# Patient Record
Sex: Female | Born: 1940 | Race: White | Hispanic: No | Smoking: Former smoker
Health system: Southern US, Community
[De-identification: ages and names within clinical notes are randomized; demographics above are authoritative.]

## PROBLEM LIST (undated history)

## (undated) DIAGNOSIS — M549 Dorsalgia, unspecified: Secondary | ICD-10-CM

## (undated) DIAGNOSIS — I10 Essential (primary) hypertension: Secondary | ICD-10-CM

## (undated) DIAGNOSIS — S329XXA Fracture of unspecified parts of lumbosacral spine and pelvis, initial encounter for closed fracture: Secondary | ICD-10-CM

## (undated) DIAGNOSIS — K219 Gastro-esophageal reflux disease without esophagitis: Secondary | ICD-10-CM

## (undated) DIAGNOSIS — F17200 Nicotine dependence, unspecified, uncomplicated: Secondary | ICD-10-CM

## (undated) DIAGNOSIS — K449 Diaphragmatic hernia without obstruction or gangrene: Secondary | ICD-10-CM

## (undated) DIAGNOSIS — I509 Heart failure, unspecified: Secondary | ICD-10-CM

## (undated) DIAGNOSIS — E785 Hyperlipidemia, unspecified: Secondary | ICD-10-CM

## (undated) DIAGNOSIS — G8929 Other chronic pain: Secondary | ICD-10-CM

## (undated) HISTORY — DX: Gastro-esophageal reflux disease without esophagitis: K21.9

## (undated) HISTORY — DX: Diaphragmatic hernia without obstruction or gangrene: K44.9

## (undated) HISTORY — DX: Other chronic pain: G89.29

## (undated) HISTORY — DX: Dorsalgia, unspecified: M54.9

## (undated) HISTORY — PX: ABDOMINAL HYSTERECTOMY: SHX81

## (undated) HISTORY — DX: Essential (primary) hypertension: I10

## (undated) HISTORY — PX: HEMORROIDECTOMY: SUR656

## (undated) HISTORY — DX: Fracture of unspecified parts of lumbosacral spine and pelvis, initial encounter for closed fracture: S32.9XXA

## (undated) HISTORY — PX: TONSILLECTOMY: SUR1361

## (undated) HISTORY — PX: LUMBAR SPINE SURGERY: SHX701

## (undated) HISTORY — DX: Hyperlipidemia, unspecified: E78.5

## (undated) HISTORY — DX: Nicotine dependence, unspecified, uncomplicated: F17.200

## (undated) HISTORY — PX: APPENDECTOMY: SHX54

## (undated) HISTORY — PX: OOPHORECTOMY: SHX86

---

## 2001-03-14 HISTORY — PX: GASTRIC BYPASS: SHX52

## 2008-03-14 LAB — HM COLONOSCOPY: HM COLON: NORMAL

## 2012-12-12 DIAGNOSIS — S329XXA Fracture of unspecified parts of lumbosacral spine and pelvis, initial encounter for closed fracture: Secondary | ICD-10-CM

## 2012-12-12 HISTORY — DX: Fracture of unspecified parts of lumbosacral spine and pelvis, initial encounter for closed fracture: S32.9XXA

## 2013-01-28 ENCOUNTER — Encounter: Payer: Self-pay | Admitting: Physician Assistant

## 2013-01-28 ENCOUNTER — Ambulatory Visit (INDEPENDENT_AMBULATORY_CARE_PROVIDER_SITE_OTHER): Payer: Medicare Other | Admitting: Physician Assistant

## 2013-01-28 VITALS — BP 146/80 | HR 80 | Temp 98.3°F | Resp 18 | Ht 61.0 in | Wt 176.0 lb

## 2013-01-28 DIAGNOSIS — D649 Anemia, unspecified: Secondary | ICD-10-CM

## 2013-01-28 DIAGNOSIS — IMO0001 Reserved for inherently not codable concepts without codable children: Secondary | ICD-10-CM

## 2013-01-28 DIAGNOSIS — K449 Diaphragmatic hernia without obstruction or gangrene: Secondary | ICD-10-CM | POA: Insufficient documentation

## 2013-01-28 DIAGNOSIS — I10 Essential (primary) hypertension: Secondary | ICD-10-CM | POA: Insufficient documentation

## 2013-01-28 DIAGNOSIS — K219 Gastro-esophageal reflux disease without esophagitis: Secondary | ICD-10-CM | POA: Insufficient documentation

## 2013-01-28 DIAGNOSIS — M549 Dorsalgia, unspecified: Secondary | ICD-10-CM

## 2013-01-28 DIAGNOSIS — F411 Generalized anxiety disorder: Secondary | ICD-10-CM

## 2013-01-28 DIAGNOSIS — S329XXD Fracture of unspecified parts of lumbosacral spine and pelvis, subsequent encounter for fracture with routine healing: Secondary | ICD-10-CM

## 2013-01-28 DIAGNOSIS — F17200 Nicotine dependence, unspecified, uncomplicated: Secondary | ICD-10-CM

## 2013-01-28 DIAGNOSIS — G8929 Other chronic pain: Secondary | ICD-10-CM | POA: Insufficient documentation

## 2013-01-28 DIAGNOSIS — E785 Hyperlipidemia, unspecified: Secondary | ICD-10-CM | POA: Insufficient documentation

## 2013-01-28 LAB — CBC WITH DIFFERENTIAL/PLATELET
Basophils Relative: 0 % (ref 0–1)
Eosinophils Absolute: 0.1 10*3/uL (ref 0.0–0.7)
HCT: 34.3 % — ABNORMAL LOW (ref 36.0–46.0)
Hemoglobin: 10.8 g/dL — ABNORMAL LOW (ref 12.0–15.0)
Lymphocytes Relative: 30 % (ref 12–46)
MCH: 24.3 pg — ABNORMAL LOW (ref 26.0–34.0)
MCHC: 31.5 g/dL (ref 30.0–36.0)
Monocytes Absolute: 0.5 10*3/uL (ref 0.1–1.0)
Monocytes Relative: 7 % (ref 3–12)
Neutro Abs: 4.6 10*3/uL (ref 1.7–7.7)
Neutrophils Relative %: 62 % (ref 43–77)

## 2013-01-28 MED ORDER — PANTOPRAZOLE SODIUM 40 MG PO TBEC: 40.0000 mg | DELAYED_RELEASE_TABLET | Freq: Every day | ORAL | Status: DC

## 2013-01-28 MED ORDER — TRAZODONE HCL 50 MG PO TABS: 50.0000 mg | ORAL_TABLET | Freq: Every day | ORAL | Status: DC

## 2013-01-28 MED ORDER — OXYCODONE-ACETAMINOPHEN 10-325 MG PO TABS: 1.0000 | ORAL_TABLET | ORAL | Status: DC | PRN

## 2013-01-28 MED ORDER — TIZANIDINE HCL 4 MG PO TABS: 4.0000 mg | ORAL_TABLET | Freq: Every day | ORAL | Status: DC

## 2013-01-28 MED ORDER — CLONAZEPAM 1 MG PO TABS: 1.0000 mg | ORAL_TABLET | Freq: Every day | ORAL | Status: DC

## 2013-01-28 MED ORDER — ATORVASTATIN CALCIUM 20 MG PO TABS: 20.0000 mg | ORAL_TABLET | Freq: Every day | ORAL | Status: DC

## 2013-01-28 MED ORDER — GABAPENTIN 600 MG PO TABS: 600.0000 mg | ORAL_TABLET | Freq: Three times a day (TID) | ORAL | Status: DC

## 2013-01-29 LAB — COMPLETE METABOLIC PANEL WITH GFR
Albumin: 3.9 g/dL (ref 3.5–5.2)
Alkaline Phosphatase: 146 U/L — ABNORMAL HIGH (ref 39–117)
BUN: 9 mg/dL (ref 6–23)
CO2: 27 mEq/L (ref 19–32)
Calcium: 9.5 mg/dL (ref 8.4–10.5)
GFR, Est African American: 76 mL/min
GFR, Est Non African American: 66 mL/min
Glucose, Bld: 97 mg/dL (ref 70–99)
Potassium: 4.2 mEq/L (ref 3.5–5.3)
Total Bilirubin: 0.4 mg/dL (ref 0.3–1.2)

## 2013-01-29 NOTE — Progress Notes (Signed)
Patient ID: Brenda Hopkins MRN: [id number], DOB: 28-Jul-1940, 72 y.o. Date of Encounter: @DATE @  Chief Complaint:  Chief Complaint  Patient presents with  . NP - Establish  . Hospitalization Follow-up    In hospital in [place] - fractured pelvis    HPI: 72 y.o. year old white female  presents with her daughter for her OV today. She is being seen as a new pt to our office to establish care.  She was living in [place] but recently was hospitalized in [place] and since discharge from the hospital, she has come here to live with her daughter.  ALL RECORDS ARE PENDING. ALL HISTORY IS FROM PT AND DAUGHTER. They report that she fell and had pelvic fracture. During hopsitalizaton, developed anemia and was determined to have "internal bleeding." Also was treated for UTI during that hospitalization per pt/dtr.   They report that she was already on pain meds secondary to back pain prior to the hospitalization. Reports that she had two back surgeries in 2009 after a fall.  Says she was on Hydrocodone, then later Oxycodone. -for her back.  Also, was already on Trazadone, "to help her relax, to sleep."  Says that she was just started on potassium at the hospital. 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    Past Medical History  Diagnosis Date  . Hypertension   . Chronic back pain   . Hyperlipidemia   . GERD (gastroesophageal reflux disease)   . Hiatal hernia   . Pelvic fracture 12/12/2012  . Smoker      Home Meds: See attached medication section for current medication list. Any medications entered into computer today will not appear on this note's list. The medications listed below were entered prior to today. No current outpatient prescriptions on file prior to visit.   No current facility-administered medications on file prior to visit.    Allergies:  Allergies  Allergen Reactions  . Nicoderm [Nicotine]     Site rash    History   Social History  . Marital Status: Divorced   Spouse Name: N/A    Number of Children: N/A  . Years of Education: N/A   Occupational History  . Not on file.   Social History Main Topics  . Smoking status: Former Smoker    Types: Cigarettes  . Smokeless tobacco: Former Neurosurgeon    Quit date: 12/28/2012  . Alcohol Use: No  . Drug Use: No  . Sexual Activity: Not on file   Other Topics Concern  . Not on file   Social History Narrative  . No narrative on file    History reviewed. No pertinent family history.   Review of Systems:  See HPI for pertinent ROS. All other ROS negative.    Physical Exam: Blood pressure 146/80, pulse 80, temperature 98.3 F (36.8 C), temperature source Oral, resp. rate 18, height 5\' 1"  (1.549 m), weight 176 lb (79.833 kg)., Body mass index is 33.27 kg/(m^2). General: WNWD WF. Appears in no acute distress. Head: Normocephalic, atraumatic, eyes without discharge, sclera non-icteric, nares are without discharge. Bilateral auditory canals clear, TM's are without perforation, pearly grey and translucent with reflective cone of light bilaterally. Oral cavity moist, posterior pharynx without exudate, erythema, peritonsillar abscess, or post nasal drip.  Neck: Supple. No thyromegaly. No lymphadenopathy. Lungs: Clear bilaterally to auscultation without wheezes, rales, or rhonchi. Breathing is unlabored. Heart: RRR with S1 S2. No murmurs, rubs, or gallops. Abdomen: Soft, non-tender, non-distended with normoactive bowel sounds. No hepatomegaly. No  rebound/guarding. No obvious abdominal masses. Extremities/Skin: Warm and dry. No clubbing or cyanosis. No edema. Neuro: Alert and oriented X 3. Moves all extremities spontaneously. Gait is normal. CNII-XII grossly in tact. Psych:  Responds to questions appropriately with a normal affect.     ASSESSMENT AND PLAN:  72 y.o. year old female with  1. Pelvic fracture, with routine healing, subsequent encounter - oxyCODONE-acetaminophen (PERCOCET) 10-325 MG per tablet; Take  1 tablet by mouth every 4 (four) hours as needed for pain. One every 8 hours as needed for pain.  Dispense: 30 tablet; Refill: 0  2. Anemia - CBC with Differential  3. Chronic back pain - COMPLETE METABOLIC PANEL WITH GFR - traZODone (DESYREL) 50 MG tablet; Take 1 tablet (50 mg total) by mouth at bedtime.  Dispense: 30 tablet; Refill: 2 - tiZANidine (ZANAFLEX) 4 MG tablet; Take 1 tablet (4 mg total) by mouth at bedtime.  Dispense: 30 tablet; Refill: 0 - oxyCODONE-acetaminophen (PERCOCET) 10-325 MG per tablet; Take 1 tablet by mouth every 4 (four) hours as needed for pain. One every 8 hours as needed for pain.  Dispense: 30 tablet; Refill: 0 - gabapentin (NEURONTIN) 600 MG tablet; Take 1 tablet (600 mg total) by mouth 3 (three) times daily.  Dispense: 90 tablet; Refill: 0  4. Hypertension - COMPLETE METABOLIC PANEL WITH GFR  5. Hyperlipidemia - COMPLETE METABOLIC PANEL WITH GFR - atorvastatin (LIPITOR) 20 MG tablet; Take 1 tablet (20 mg total) by mouth daily.  Dispense: 30 tablet; Refill: 2  6. GERD (gastroesophageal reflux disease) - COMPLETE METABOLIC PANEL WITH GFR - pantoprazole (PROTONIX) 40 MG tablet; Take 1 tablet (40 mg total) by mouth daily.  Dispense: 30 tablet; Refill: 3  7. Hiatal hernia  8. Generalized anxiety disorder - clonazePAM (KLONOPIN) 1 MG tablet; Take 1 tablet (1 mg total) by mouth daily.  Dispense: 30 tablet; Refill: 0  9. Smoker She says she quit smoking at time of recent hopitalization. Since then, smoking e-cigarettes only.   Will check labs. Refilled meds to hold her over until we get records. Will have her schedule f/u OV 3 weeks so we can further f/u once we obtain records.    Murray Hodgkins [place], [place], [facility] 01/29/2013 9:42 AM

## 2013-01-31 ENCOUNTER — Telehealth: Payer: Self-pay | Admitting: Physician Assistant

## 2013-01-31 NOTE — Telephone Encounter (Signed)
PT daughter carmen is calling to follow up to see if we have received her mothers medical records from [place] and if we have is the dr going to up her pain medications  Call back number for daughter is her cell [phone] or home of [phone]

## 2013-02-01 ENCOUNTER — Telehealth: Payer: Self-pay | Admitting: Physician Assistant

## 2013-02-01 NOTE — Telephone Encounter (Addendum)
Patient needs all her medicines refilled. Please call her before 5:00 pm.

## 2013-02-04 ENCOUNTER — Telehealth: Payer: Self-pay | Admitting: Physician Assistant

## 2013-02-04 DIAGNOSIS — G8929 Other chronic pain: Secondary | ICD-10-CM

## 2013-02-04 DIAGNOSIS — S329XXD Fracture of unspecified parts of lumbosacral spine and pelvis, subsequent encounter for fracture with routine healing: Secondary | ICD-10-CM

## 2013-02-04 NOTE — Telephone Encounter (Signed)
Please call her regarding a medication refill

## 2013-02-04 NOTE — Telephone Encounter (Signed)
Have you seen the records for this pt yet?

## 2013-02-04 NOTE — Telephone Encounter (Signed)
Requesting refill of Percocet.  States takes every four hours and she is out.  Rx given on 01/28/13  #30.

## 2013-02-05 MED ORDER — OXYCODONE-ACETAMINOPHEN 10-325 MG PO TABS: 1.0000 | ORAL_TABLET | Freq: Three times a day (TID) | ORAL | Status: DC | PRN

## 2013-02-05 NOTE — Telephone Encounter (Signed)
Spoke to provider [facility].  Discussed records received and medications being requested by patient.  Told to refill Percocet 10/325 one tablet every eight hours # 90 No refills.  Tell patient this must last her one month.  No refills prior to that.  Pain management referral will be initiated for her. Rx printed for signature

## 2013-02-05 NOTE — Telephone Encounter (Addendum)
Trying to call patient. (Pt called me back)  Question as to on Oxycodone 20 mg Q12 hrs??.  Need for Percocet Q4hrs.  States doesn't have any OxyContin and does not want to take that.  Did not seem to help.  Wants the Percocet that she can take every fours hours.  I told her we felt that was in excess and that we may need to sent to pain management as our providers do not do long term pain meds.  She states she does not want to go to Pain mgmt.  Was sent there in [place] and "They were not nice to her"  She says she is in terrible pain and she will take whatever we can give her.

## 2013-02-05 NOTE — Telephone Encounter (Signed)
Defer to MBD

## 2013-02-18 ENCOUNTER — Encounter: Payer: Self-pay | Admitting: Physician Assistant

## 2013-02-18 ENCOUNTER — Ambulatory Visit (INDEPENDENT_AMBULATORY_CARE_PROVIDER_SITE_OTHER): Payer: Medicare Other | Admitting: Physician Assistant

## 2013-02-18 VITALS — BP 154/100 | HR 68 | Temp 98.9°F | Resp 18 | Wt 176.0 lb

## 2013-02-18 DIAGNOSIS — S329XXS Fracture of unspecified parts of lumbosacral spine and pelvis, sequela: Secondary | ICD-10-CM

## 2013-02-18 DIAGNOSIS — I10 Essential (primary) hypertension: Secondary | ICD-10-CM

## 2013-02-18 DIAGNOSIS — IMO0002 Reserved for concepts with insufficient information to code with codable children: Secondary | ICD-10-CM

## 2013-02-18 DIAGNOSIS — G8929 Other chronic pain: Secondary | ICD-10-CM

## 2013-02-18 DIAGNOSIS — M549 Dorsalgia, unspecified: Secondary | ICD-10-CM

## 2013-02-18 MED ORDER — OXYCODONE HCL 10 MG PO TABS: 10.0000 mg | ORAL_TABLET | ORAL | Status: DC | PRN

## 2013-02-18 MED ORDER — AMLODIPINE BESYLATE 5 MG PO TABS: 5.0000 mg | ORAL_TABLET | Freq: Every day | ORAL | Status: DC

## 2013-02-18 NOTE — Progress Notes (Signed)
Patient ID: Brenda Hopkins MRN: [id number], DOB: 08-21-1940, 72 y.o. Date of Encounter: @DATE @  Chief Complaint:  Chief Complaint  Patient presents with  . follow up    lab results from last visit, needs more pain pills    HPI: 72 y.o. year old white female  presents for f/u OV.  She is only had one office visit here prior to today. That was with me on 01/28/13.  At that visit she was here with her daughter. She had been living in [place] until she was recently hospitalized in [place] secondary to a pelvic fracture. At the time of her initial visit with me we do not have any records and all information was according to the patient and her daughter. Since that fall and pelvic fracture she has moved here to where she can live with her daughter.  They reported that she was already on pain medications secondary to back pain prior to the most recent hospitalization. She reported that she had had 2 back surgeries in 2009 after a fall. She reported that she had been on hydrocodone and then later on oxycodone for her back. Also was already on trazodone" to help her relax and sleep".  At her visit with me on 01/28/13 we discussed our clinic policy regarding chronic pain medications. I told her that we typically do not prescribe any pain medications until we have records to review. She reported she absolutely had to have some pain medications. Gave her #30 of Percocet to hold her over until she can have followup once we obtained records.  Did obtain records which did indicate that she had been on chronic pain medications even prior to the recent pelvic fracture.  She was then informed to keep her followup office visit for today but that she would also have to follow up with the pain clinic as we would not prescribe long-term pain meds.  At her last visit here, I gave prescription with note for her to only use the Percocet one every 8 hours. SHe says that she absolutely cannot go 8 hours  between doses. Says she has to take it every 4 hours. States that after 4 hours the medicine  wears off and she has severe pain. Also she is requesting to have plain oxycodone without Tylenol mixed in. States taht " Tylenol does nothing for her back pain."   Past Medical History  Diagnosis Date  . Hypertension   . Chronic back pain   . Hyperlipidemia   . GERD (gastroesophageal reflux disease)   . Hiatal hernia   . Pelvic fracture 12/12/2012  . Smoker      Home Meds: See attached medication section for current medication list. Any medications entered into computer today will not appear on this note's list. The medications listed below were entered prior to today. Current Outpatient Prescriptions on File Prior to Visit  Medication Sig Dispense Refill  . atorvastatin (LIPITOR) 20 MG tablet Take 1 tablet (20 mg total) by mouth daily.  30 tablet  2  . clonazePAM (KLONOPIN) 1 MG tablet Take 1 tablet (1 mg total) by mouth daily.  30 tablet  0  . gabapentin (NEURONTIN) 600 MG tablet Take 1 tablet (600 mg total) by mouth 3 (three) times daily.  90 tablet  0  . oxyCODONE-acetaminophen (PERCOCET) 10-325 MG per tablet Take 1 tablet by mouth every 8 (eight) hours as needed for pain. One every 8 hours as needed for pain.  90 tablet  0  .  pantoprazole (PROTONIX) 40 MG tablet Take 1 tablet (40 mg total) by mouth daily.  30 tablet  3  . potassium chloride (K-DUR) 10 MEQ tablet Take 10 mEq by mouth daily.      Marland Kitchen tiZANidine (ZANAFLEX) 4 MG tablet Take 1 tablet (4 mg total) by mouth at bedtime.  30 tablet  0  . traZODone (DESYREL) 50 MG tablet Take 1 tablet (50 mg total) by mouth at bedtime.  30 tablet  2   No current facility-administered medications on file prior to visit.    Allergies:  Allergies  Allergen Reactions  . Nicoderm [Nicotine]     Site rash    History   Social History  . Marital Status: Divorced    Spouse Name: N/A    Number of Children: N/A  . Years of Education: N/A    Occupational History  . Not on file.   Social History Main Topics  . Smoking status: Former Smoker    Types: Cigarettes  . Smokeless tobacco: Former Neurosurgeon    Quit date: 12/28/2012  . Alcohol Use: No  . Drug Use: No  . Sexual Activity: Not on file   Other Topics Concern  . Not on file   Social History Narrative  . No narrative on file    No family history on file.   Review of Systems:  See HPI for pertinent ROS. All other ROS negative.    Physical Exam: Blood pressure 154/100, pulse 68, temperature 98.9 F (37.2 C), temperature source Oral, resp. rate 18, weight 176 lb (79.833 kg)., Body mass index is 33.27 kg/(m^2). General: Appears in no acute distress. Head: Normocephalic, atraumatic, eyes without discharge, sclera non-icteric, nares are without discharge. Bilateral auditory canals clear, TM's are without perforation, pearly grey and translucent with reflective cone of light bilaterally. Oral cavity moist, posterior pharynx without exudate, erythema, peritonsillar abscess, or post nasal drip.  Neck: Supple. No thyromegaly. No lymphadenopathy. Lungs: Clear bilaterally to auscultation without wheezes, rales, or rhonchi. Breathing is unlabored. Heart: RRR with S1 S2. No murmurs, rubs, or gallops. Abdomen: Soft, non-tender, non-distended with normoactive bowel sounds. No hepatomegaly. No rebound/guarding. No obvious abdominal masses. Musculoskeletal:  Strength and tone normal for age. Extremities/Skin: Warm and dry. No clubbing or cyanosis. No edema. No rashes or suspicious lesions. Neuro: Alert and oriented X 3. Moves all extremities spontaneously. Gait is normal. CNII-XII grossly in tact. Psych:  Responds to questions appropriately with a normal affect.     ASSESSMENT AND PLAN:  72 y.o. year old female with  1. Hypertension She says that she was on Amlodipine 5 mg in the past but somehow this has gotten left off of her current medications. Her blood pressure is elevated.  We'll add back Norvasc 5 mg. - amLODipine (NORVASC) 5 MG tablet; Take 1 tablet (5 mg total) by mouth daily.  Dispense: 90 tablet; Refill: 3  2. Chronic back pain - Oxycodone HCl 10 MG TABS; Take 1 tablet (10 mg total) by mouth every 4 (four) hours as needed.  Dispense: 90 tablet; Refill: 0  3. Pelvic fracture, sequela - Oxycodone HCl 10 MG TABS; Take 1 tablet (10 mg total) by mouth every 4 (four) hours as needed.  Dispense: 90 tablet; Refill: 0  I again told her that I will not prescribe chronic long-term pain medications. Today Kim checked and did see that there is a referral to the pain clinic in the computer system but just has not yet been scheduled. We'll followup on  making sure that this referral does occur. I told the patient that I would go ahead and put the oxycodone as to take every 4 hours but I cannot give her such a large quantity. We just give her #90 pills right now and will followup with the remainder of the prescription when it is due. As well at that time we can check the status of a referral to the pain clinic. The rest of her medical problems/ diagnosis list are outlined in my  office visit note dated 01/28/13. As well her records from [place] have been reviewed by me and are in the [place] [facility] for Selena Batten to abstract.  [address] [place], [place], [facility] 02/18/2013 5:02 PM

## 2013-02-26 ENCOUNTER — Telehealth: Payer: Self-pay | Admitting: Family Medicine

## 2013-02-26 DIAGNOSIS — G8929 Other chronic pain: Secondary | ICD-10-CM

## 2013-02-26 NOTE — Telephone Encounter (Signed)
Req Rf Tizanidine and Clonazepam.  Tizanidine LRF 11/17 #30  Clonazepam LRF 11/17 #30  LOV 02/18/13  OK refill?

## 2013-02-27 MED ORDER — TRAZODONE HCL 50 MG PO TABS: 50.0000 mg | ORAL_TABLET | Freq: Every day | ORAL | Status: DC

## 2013-02-27 NOTE — Telephone Encounter (Signed)
Spoke to patient.  Told provider concern about her taking the Zanaflex and Trazodone both at bedtime.  Told provider will only refill one of them.  Pt chose the Trazodone refill. Refill was sent and Zanaflex removed form med list.

## 2013-02-27 NOTE — Telephone Encounter (Signed)
Pt recently moved here and transferred her care here.  She was already on these meds prior to coming here.  I do not agree with all the meds she is taking and am in process of getting her into a pain clinic. Tell Patient that I was just reviewing her medications. Realized that she had been taking both Zanaflex and trazodone at night. I do not think it is safe to take both of these together. Tell her we need to stop one or the other. Ask her which one she feels she needs the most so we can decide which one to continue and which one to stop. I will not continue to prescribe both of these.

## 2013-02-28 ENCOUNTER — Other Ambulatory Visit: Payer: Self-pay | Admitting: Physician Assistant

## 2013-02-28 ENCOUNTER — Telehealth: Payer: Self-pay | Admitting: Family Medicine

## 2013-02-28 DIAGNOSIS — G8929 Other chronic pain: Secondary | ICD-10-CM

## 2013-02-28 DIAGNOSIS — S329XXS Fracture of unspecified parts of lumbosacral spine and pelvis, sequela: Secondary | ICD-10-CM

## 2013-02-28 NOTE — Telephone Encounter (Signed)
No more Tizanadine. Approved for refill on the clonazepam. #30+0 additional refill.

## 2013-02-28 NOTE — Telephone Encounter (Signed)
Request refill Tizanidine (this was d/c'd)  Also asking for Clonazepam  Last RF 11/17 #30  Pt called about her Oxycodone.  Requesting refill.

## 2013-03-01 NOTE — Telephone Encounter (Signed)
Clonazepam was called into pharmacy.  Pharmacy informed that Tizanidine has been discontinued.  I discussed her pain medication.  She states you gave her a 1/2 month supply and will she be able to get the other half when due next week??

## 2013-03-04 ENCOUNTER — Telehealth: Payer: Self-pay | Admitting: Physician Assistant

## 2013-03-04 MED ORDER — OXYCODONE HCL 10 MG PO TABS: 10.0000 mg | ORAL_TABLET | ORAL | Status: DC | PRN

## 2013-03-04 NOTE — Telephone Encounter (Signed)
[phone] or [phone] Pt is calling because she is needing a refill on her oxycodone it will run out on the 12/23

## 2013-03-04 NOTE — Telephone Encounter (Signed)
She insisted she had to take pain med Q 4 hours. I said "Fine-until f/u with pain clinic"- but that I was only giving 90 at a time.

## 2013-03-04 NOTE — Telephone Encounter (Signed)
RX printed and pt aware. 

## 2013-03-04 NOTE — Telephone Encounter (Signed)
PT keeps calling back and would like a call back

## 2013-03-04 NOTE — Telephone Encounter (Signed)
Pain referral still pending.  #90 (15 day supply) up tomorrow.  Refill or not??

## 2013-03-04 NOTE — Telephone Encounter (Signed)
Approved. May print another prescription for another  #90.

## 2013-03-04 NOTE — Telephone Encounter (Signed)
Pt called and left a voicemail asking for you to call her back regarding a prescription Call back number is [phone]

## 2013-03-18 ENCOUNTER — Telehealth: Payer: Self-pay | Admitting: Physician Assistant

## 2013-03-18 DIAGNOSIS — M549 Dorsalgia, unspecified: Principal | ICD-10-CM

## 2013-03-18 DIAGNOSIS — G8929 Other chronic pain: Secondary | ICD-10-CM

## 2013-03-18 DIAGNOSIS — S329XXD Fracture of unspecified parts of lumbosacral spine and pelvis, subsequent encounter for fracture with routine healing: Secondary | ICD-10-CM

## 2013-03-18 MED ORDER — OXYCODONE-ACETAMINOPHEN 10-325 MG PO TABS: 1.0000 | ORAL_TABLET | Freq: Three times a day (TID) | ORAL | Status: DC | PRN

## 2013-03-18 MED ORDER — CLONAZEPAM 1 MG PO TABS
1.0000 mg | ORAL_TABLET | Freq: Every day | ORAL | Status: DC
Start: 2013-03-18 — End: 2013-05-02

## 2013-03-18 MED ORDER — GABAPENTIN 600 MG PO TABS: 600.0000 mg | ORAL_TABLET | Freq: Three times a day (TID) | ORAL | Status: DC

## 2013-03-18 NOTE — Telephone Encounter (Signed)
Rx printed for provider signature 

## 2013-03-18 NOTE — Telephone Encounter (Signed)
Pt aware Rx ready 

## 2013-03-18 NOTE — Telephone Encounter (Signed)
Can go ahead and print prescription for the oxycodone and we will write "do not fill until..." the date due  To be filled.  As well, can go ahead and print prescription for Klonopin and also will write " do not fill until ..." the date that it is due.  FIND OUT STATUS OF REFERRAL TO PAIN CLINIC

## 2013-03-18 NOTE — Telephone Encounter (Signed)
Last RF OXY 12/22 #90,   Last RF Klonopin 12/18 #30,  Last RF Gabapentin 11/17 #90.

## 2013-03-18 NOTE — Telephone Encounter (Signed)
Pt is needing gabapentin, oxycodone, and klonopin  Call back number is [phone]

## 2013-03-28 ENCOUNTER — Encounter: Payer: Self-pay | Admitting: *Deleted

## 2013-04-04 ENCOUNTER — Telehealth: Payer: Self-pay | Admitting: Physician Assistant

## 2013-04-04 NOTE — Telephone Encounter (Signed)
No.  Denied.

## 2013-04-04 NOTE — Telephone Encounter (Signed)
Pt daughter called and the pt was taken off all her pain medications by her pain doctor, she was taken off them cold [place]. The daughter states that the mother is deff needing some xanax or something for her anxiety she has barley slept 2 hours within a 24 hour period Call back number is [phone] Pharmacy is [facility]

## 2013-04-04 NOTE — Telephone Encounter (Signed)
Pt called.  She states she took all her pain medicine to quickly and now she is out.  Pain clinic will not refill until due which is 04/18/13.  Wants to know if you will increase and refill her clonazepam because now she is having withdrawal??  Or prescribe something else?

## 2013-04-04 NOTE — Telephone Encounter (Signed)
Daughter says pain doctor ordered her pain med's for TID and she continued to take Q4hrs (thus she ran out)  Pain provider refuses to renew before 2/5 (that RX to be 5/day) Refused to order her anything else. Spoke to them about withdrawal symptoms. Told daughter request for more meds denied.  Asked to talk to provider, provider refused.  Answer to more meds NO.  If patient has problem with withdrawal should take to ED.

## 2013-05-01 ENCOUNTER — Telehealth: Payer: Self-pay | Admitting: *Deleted

## 2013-05-02 ENCOUNTER — Other Ambulatory Visit: Payer: Self-pay | Admitting: Physician Assistant

## 2013-05-02 NOTE — Telephone Encounter (Signed)
Approved for #30  +  0 additional refills

## 2013-05-02 NOTE — Telephone Encounter (Signed)
RX's called in  

## 2013-05-02 NOTE — Telephone Encounter (Signed)
Last RF clonazepam 03/30/13 #30.  Last OV 02/18/13.  OK refill?

## 2013-06-01 ENCOUNTER — Other Ambulatory Visit: Payer: Self-pay | Admitting: Physician Assistant

## 2013-06-03 NOTE — Telephone Encounter (Signed)
Medication refilled per protocol. 

## 2013-07-01 ENCOUNTER — Telehealth: Payer: Self-pay | Admitting: Physician Assistant

## 2013-07-01 NOTE — Telephone Encounter (Signed)
See phone note from 02/26/13. At that time realized patient was taking the Zanaflex and trazodone at night. Told her she had to stop one of these and could only continue one of these. At that time she chose to continue trazodone and to stop the Zanaflex. Tell her I am NOT prescribing   any type of muscle relaxer.

## 2013-07-01 NOTE — Telephone Encounter (Signed)
Call back number is [phone] Pt is wanting to know if Shon HaleMary Beth can switch her muscle relaxer to soma?

## 2013-07-03 ENCOUNTER — Other Ambulatory Visit: Payer: Self-pay | Admitting: Physician Assistant

## 2013-07-03 NOTE — Telephone Encounter (Signed)
Last RF clonazepam 06/03/13  #30  Last OV 02/18/13  OK refill?

## 2013-07-04 NOTE — Telephone Encounter (Signed)
Medication refilled per protocol. 

## 2013-07-04 NOTE — Telephone Encounter (Signed)
ApProved for #30 + 0

## 2013-07-11 ENCOUNTER — Ambulatory Visit: Payer: Medicare Other | Admitting: Physician Assistant

## 2013-07-15 ENCOUNTER — Encounter: Payer: Self-pay | Admitting: Physician Assistant

## 2013-07-15 ENCOUNTER — Ambulatory Visit (INDEPENDENT_AMBULATORY_CARE_PROVIDER_SITE_OTHER): Payer: Medicare Other | Admitting: Physician Assistant

## 2013-07-15 VITALS — BP 130/86 | HR 98 | Temp 98.3°F | Resp 18 | Ht <= 58 in | Wt 173.0 lb

## 2013-07-15 DIAGNOSIS — F411 Generalized anxiety disorder: Secondary | ICD-10-CM

## 2013-07-15 DIAGNOSIS — K219 Gastro-esophageal reflux disease without esophagitis: Secondary | ICD-10-CM

## 2013-07-15 DIAGNOSIS — I10 Essential (primary) hypertension: Secondary | ICD-10-CM

## 2013-07-15 DIAGNOSIS — E785 Hyperlipidemia, unspecified: Secondary | ICD-10-CM

## 2013-07-15 DIAGNOSIS — S329XXA Fracture of unspecified parts of lumbosacral spine and pelvis, initial encounter for closed fracture: Secondary | ICD-10-CM

## 2013-07-15 DIAGNOSIS — F17200 Nicotine dependence, unspecified, uncomplicated: Secondary | ICD-10-CM

## 2013-07-15 DIAGNOSIS — G8929 Other chronic pain: Secondary | ICD-10-CM

## 2013-07-15 DIAGNOSIS — M549 Dorsalgia, unspecified: Secondary | ICD-10-CM

## 2013-07-15 DIAGNOSIS — K449 Diaphragmatic hernia without obstruction or gangrene: Secondary | ICD-10-CM

## 2013-07-15 MED ORDER — SERTRALINE HCL 50 MG PO TABS: 50.0000 mg | ORAL_TABLET | Freq: Every day | ORAL | Status: DC

## 2013-07-15 MED ORDER — ALPRAZOLAM 0.5 MG PO TABS: 0.5000 mg | ORAL_TABLET | Freq: Every day | ORAL | Status: DC | PRN

## 2013-07-15 MED ORDER — CARISOPRODOL 350 MG PO TABS: 350.0000 mg | ORAL_TABLET | Freq: Three times a day (TID) | ORAL | Status: DC

## 2013-07-15 NOTE — Progress Notes (Signed)
Patient ID: Brenda DrapeBrenda Gail Varas MRN: [id number], DOB: 05/13/1940, 73 y.o. Date of Encounter: @DATE @  Chief Complaint:  Chief Complaint  Patient presents with  . wants something for anxiety and take off clonazepam i    HPI: 73 y.o. year old white female  presents for f/u OV.  She is only had 2 office visits here prior to today. The first OV was with me on 01/28/13.  At that visit she was here with her daughter. She had been living in [place] until she was recently hospitalized in [place] secondary to a pelvic fracture. At the time of her initial visit with me we do not have any records and all information was according to the patient and her daughter. Since that fall and pelvic fracture she has moved here to where she can live with her daughter.  They reported that she was already on pain medications secondary to back pain prior to the most recent hospitalization. She reported that she had had 2 back surgeries in 2009 after a fall. She reported that she had been on hydrocodone and then later on oxycodone for her back. Also was already on trazodone" to help her relax and sleep".  At her visit with me on 01/28/13 we discussed our clinic policy regarding chronic pain medications. I told her that we typically do not prescribe any pain medications until we have records to review. She reported she absolutely had to have some pain medications. Gave her #30 of Percocet to hold her over until she can have followup once we obtained records.  Did obtain records which did indicate that she had been on chronic pain medications even prior to the recent pelvic fracture.  She was then informed to keep her followup office visit for today but that she would also have to follow up with the pain clinic as we would not prescribe long-term pain meds.  She has had a multitude of phone calls with our office regarding changing medications over these months.   Today her daughter is with her again for her  visit. Daughter  does most of the talking. Both the daughter and the patient say that the patient is anxious all the time and worries about everything. Also she needs  medicine  for anxiety to help during the daytime too-not just night tome dose o fklonopin.   They both report that the patient is not sleeping well. Says that trazodone this isn't working very well--not sleeping well even with this med. . Patient says that she thinks the main reason she can't sleep is because of muscle spasms. She thinks if she had some medicine for muscle spasms to use for this during the day as well as at night that this would help with the spasms as well as help her sleep. Daughter states that soma works really well for her and both pt and daughter are requesting soma.  As the daughter says that she  uses Xanax herself that this works much better and thinks that we should use this instead of Klonopin.  The patient shows me a note that she received from her insurance regarding scheduling a complete physical exam. They will  give her $15 gift card for doing the physical.  Past Medical History  Diagnosis Date  . Hypertension   . Chronic back pain   . Hyperlipidemia   . GERD (gastroesophageal reflux disease)   . Hiatal hernia   . Pelvic fracture 12/12/2012  . Smoker  Home Meds: See attached medication section for current medication list. Any medications entered into computer today will not appear on this note's list. The medications listed below were entered prior to today. Current Outpatient Prescriptions on File Prior to Visit  Medication Sig Dispense Refill  . amLODipine (NORVASC) 5 MG tablet Take 1 tablet (5 mg total) by mouth daily.  90 tablet  3  . atorvastatin (LIPITOR) 20 MG tablet TAKE ONE TABLET AT BEDTIME  30 tablet  3  . gabapentin (NEURONTIN) 600 MG tablet TAKE ONE (1) TABLET THREE (3) TIMES EACH DAY  90 tablet  3  . Multiple Vitamin (MULTIVITAMIN) tablet Take 1 tablet by mouth daily.      .  Oxycodone HCl 10 MG TABS Take 1 tablet (10 mg total) by mouth every 4 (four) hours as needed.  90 tablet  0  . oxyCODONE-acetaminophen (PERCOCET) 10-325 MG per tablet Take 1 tablet by mouth every 8 (eight) hours as needed for pain. One every 8 hours as needed for pain.  90 tablet  0  . pantoprazole (PROTONIX) 40 MG tablet TAKE ONE TABLET ONCE DAILY  30 tablet  4  . potassium chloride (K-DUR) 10 MEQ tablet Take 10 mEq by mouth daily.       No current facility-administered medications on file prior to visit.    Allergies:  Allergies  Allergen Reactions  . Nicoderm [Nicotine]     Site rash    History   Social History  . Marital Status: Divorced    Spouse Name: N/A    Number of Children: N/A  . Years of Education: N/A   Occupational History  . Not on file.   Social History Main Topics  . Smoking status: Former Smoker    Types: Cigarettes  . Smokeless tobacco: Former Neurosurgeon    Quit date: 12/28/2012  . Alcohol Use: No  . Drug Use: No  . Sexual Activity: Not on file   Other Topics Concern  . Not on file   Social History Narrative  . No narrative on file    No family history on file.   Review of Systems:  See HPI for pertinent ROS. All other ROS negative.    Physical Exam: Blood pressure 130/86, pulse 98, temperature 98.3 F (36.8 C), temperature source Oral, resp. rate 18, height 4\' 10"  (1.473 m), weight 173 lb (78.472 kg)., Body mass index is 36.17 kg/(m^2). General: Appears in no acute distress. Head: Normocephalic, atraumatic, eyes without discharge, sclera non-icteric, nares are without discharge. Bilateral auditory canals clear, TM's are without perforation, pearly grey and translucent with reflective cone of light bilaterally. Oral cavity moist, posterior pharynx without exudate, erythema, peritonsillar abscess, or post nasal drip.  Neck: Supple. No thyromegaly. No lymphadenopathy. Lungs: Clear bilaterally to auscultation without wheezes, rales, or rhonchi. Breathing  is unlabored. Heart: RRR with S1 S2. No murmurs, rubs, or gallops. Abdomen: Soft, non-tender, non-distended with normoactive bowel sounds. No hepatomegaly. No rebound/guarding. No obvious abdominal masses. Musculoskeletal:  Strength and tone normal for age. Extremities/Skin: Warm and dry. No clubbing or cyanosis. No edema. No rashes or suspicious lesions. Neuro: Alert and oriented X 3. Moves all extremities spontaneously. Gait is normal. CNII-XII grossly in tact. Psych:  Responds to questions appropriately with a normal affect.     ASSESSMENT AND PLAN:  73 y.o. year old female with   1. Generalized anxiety disorder - sertraline (ZOLOFT) 50 MG tablet; Take 1 tablet (50 mg total) by mouth daily.  Dispense: 30 tablet;  Refill: 3 - ALPRAZolam (XANAX) 0.5 MG tablet; Take 1 tablet (0.5 mg total) by mouth daily as needed for anxiety.  Dispense: 30 tablet; Refill: 0  2. Hyperlipidemia She is on statin. Had CMET 01/28/13. Never had lipid panel sent the patient here and she is never fasting. Discussed that  we need to get fasting labs. She is going to schedule a complete physical exam one morning in next 1-2 weeks-- and come fasting to that appointment.  3. Hypertension Blood Pressure is at goal. Continue current medications.  4. Smoker  5. Hiatal hernia  6. GERD (gastroesophageal reflux disease)  7. H/O Pelvic fracture--this occurred just prior to her moving here from [place] and establishing care here in November 2014.  8. Chronic back pain She does see the pain clinic and get other pain medications from them. She was instructed at her initial visit with me that we would not prescribe long-term pain medications. - carisoprodol (SOMA) 350 MG tablet; Take 1 tablet (350 mg total) by mouth 3 (three) times daily.  Dispense: 90 tablet; Refill: 2  Today I have removed the following medications from her medicine list: Clonazepam/Klonopin. Trazodone.  New Medications added  today are Xanax  0.5 mg 1 daily when necessary #30+0 Soma 350 mg 1 by mouth 3 times a day when necessary #90+2 refill Zoloft 50 mg 1 by mouth daily #30 with refills  She is to schedule a complete physical exam and come fasting to that appointment.  Signed, [address]Jedi Catalfamo Beth PowersDixon, [place], [facility] 07/15/2013 5:20 PM

## 2013-08-07 ENCOUNTER — Encounter: Payer: Medicare Other | Admitting: Physician Assistant

## 2013-08-15 ENCOUNTER — Other Ambulatory Visit: Payer: Self-pay | Admitting: Physician Assistant

## 2013-08-15 NOTE — Telephone Encounter (Signed)
ok 

## 2013-08-15 NOTE — Telephone Encounter (Signed)
Ok to refill??  Last office visit/ refill 07/15/2013 with PA.

## 2013-08-15 NOTE — Telephone Encounter (Signed)
Medication called to pharmacy. 

## 2013-09-06 ENCOUNTER — Other Ambulatory Visit: Payer: Self-pay | Admitting: Family Medicine

## 2013-09-06 ENCOUNTER — Other Ambulatory Visit: Payer: Self-pay | Admitting: Physician Assistant

## 2013-09-06 NOTE — Telephone Encounter (Signed)
Approved.  

## 2013-09-06 NOTE — Telephone Encounter (Signed)
?   OK to Refill - not due til 09/13/13 - per pharm note will put on hold until them

## 2013-09-06 NOTE — Telephone Encounter (Signed)
Medication refilled per protocol. 

## 2013-09-06 NOTE — Telephone Encounter (Signed)
One month calledto pharmacy but DO NOT FILL until 09/13/13.

## 2013-09-11 NOTE — Telephone Encounter (Signed)
Approved. # 30 + 0. 

## 2013-10-14 ENCOUNTER — Other Ambulatory Visit: Payer: Self-pay | Admitting: Physician Assistant

## 2013-10-14 ENCOUNTER — Other Ambulatory Visit: Payer: Self-pay | Admitting: Family Medicine

## 2013-10-14 NOTE — Telephone Encounter (Signed)
Ok to refill??  Last office visit 07/15/2013.  Last refill 09/13/2013.

## 2013-10-14 NOTE — Telephone Encounter (Signed)
May  refill for #30+ 0.

## 2013-10-14 NOTE — Telephone Encounter (Signed)
Ok to refill??  Last office visit/ refill 07/15/2013, #2 refills.

## 2013-10-15 NOTE — Telephone Encounter (Signed)
Medication called to pharmacy. 

## 2013-10-16 NOTE — Telephone Encounter (Signed)
Approved. #90+2 additional refills

## 2013-10-17 NOTE — Telephone Encounter (Signed)
Medication refilled per protocol and pt was on phone and aware.

## 2013-10-21 ENCOUNTER — Encounter: Payer: Medicare Other | Admitting: Physician Assistant

## 2013-10-24 ENCOUNTER — Encounter: Payer: Self-pay | Admitting: Physician Assistant

## 2013-10-24 ENCOUNTER — Ambulatory Visit (INDEPENDENT_AMBULATORY_CARE_PROVIDER_SITE_OTHER): Payer: Medicare Other | Admitting: Physician Assistant

## 2013-10-24 VITALS — BP 140/86 | HR 84 | Temp 98.6°F | Resp 20 | Ht 61.0 in | Wt 169.0 lb

## 2013-10-24 DIAGNOSIS — Z23 Encounter for immunization: Secondary | ICD-10-CM

## 2013-10-24 DIAGNOSIS — Z0000 Encounter for general adult medical examination without abnormal findings: Secondary | ICD-10-CM

## 2013-10-24 LAB — COMPLETE METABOLIC PANEL WITH GFR
ALT: 13 U/L (ref 0–35)
AST: 17 U/L (ref 0–37)
Albumin: 4.1 g/dL (ref 3.5–5.2)
Alkaline Phosphatase: 122 U/L — ABNORMAL HIGH (ref 39–117)
BUN: 19 mg/dL (ref 6–23)
CALCIUM: 9.5 mg/dL (ref 8.4–10.5)
CHLORIDE: 100 meq/L (ref 96–112)
CO2: 25 meq/L (ref 19–32)
CREATININE: 0.76 mg/dL (ref 0.50–1.10)
GFR, Est Non African American: 80 mL/min
GLUCOSE: 109 mg/dL — AB (ref 70–99)
Potassium: 4.1 mEq/L (ref 3.5–5.3)
Sodium: 137 mEq/L (ref 135–145)
Total Bilirubin: 0.4 mg/dL (ref 0.2–1.2)
Total Protein: 7.3 g/dL (ref 6.0–8.3)

## 2013-10-24 LAB — CBC WITH DIFFERENTIAL/PLATELET
Basophils Absolute: 0 10*3/uL (ref 0.0–0.1)
Basophils Relative: 0 % (ref 0–1)
Eosinophils Absolute: 0.3 10*3/uL (ref 0.0–0.7)
Eosinophils Relative: 4 % (ref 0–5)
HEMATOCRIT: 41.5 % (ref 36.0–46.0)
HEMOGLOBIN: 14.2 g/dL (ref 12.0–15.0)
LYMPHS ABS: 3.1 10*3/uL (ref 0.7–4.0)
LYMPHS PCT: 36 % (ref 12–46)
MCH: 30.2 pg (ref 26.0–34.0)
MCHC: 34.2 g/dL (ref 30.0–36.0)
MCV: 88.3 fL (ref 78.0–100.0)
MONOS PCT: 6 % (ref 3–12)
Monocytes Absolute: 0.5 10*3/uL (ref 0.1–1.0)
Neutro Abs: 4.6 10*3/uL (ref 1.7–7.7)
Neutrophils Relative %: 54 % (ref 43–77)
Platelets: 342 10*3/uL (ref 150–400)
RBC: 4.7 MIL/uL (ref 3.87–5.11)
RDW: 15.1 % (ref 11.5–15.5)
WBC: 8.5 10*3/uL (ref 4.0–10.5)

## 2013-10-24 LAB — LIPID PANEL
Cholesterol: 142 mg/dL (ref 0–200)
HDL: 49 mg/dL (ref >39)
LDL Cholesterol: 65 mg/dL (ref 0–99)
TRIGLYCERIDES: 140 mg/dL (ref <150)
Total CHOL/HDL Ratio: 2.9 Ratio
VLDL: 28 mg/dL (ref 0–40)

## 2013-10-24 LAB — TSH: TSH: 0.959 u[IU]/mL (ref 0.350–4.500)

## 2013-10-24 NOTE — Progress Notes (Signed)
Patient ID: Brenda Hopkins MRN: [id number], DOB: 01-06-1941, 73 y.o. Date of Encounter: 10/24/2013,   Chief Complaint: Physical (CPE)  HPI: 73 y.o. y/o white female  here for CPE. As usual, her daughter is here with her today.    Review of Systems: Consitutional: No fever, chills, fatigue, night sweats, lymphadenopathy. No significant/unexplained weight changes. Eyes: No visual changes, eye redness, or discharge. ENT/Mouth: No ear pain, sore throat, nasal drainage, or sinus pain. Cardiovascular: No chest pressure,heaviness, tightness or squeezing, even with exertion. No increased shortness of breath or dyspnea on exertion.No palpitations, edema, orthopnea, PND. Respiratory: No cough, hemoptysis, SOB, or wheezing. Gastrointestinal: No anorexia, dysphagia, reflux, pain, nausea, vomiting, hematemesis, diarrhea, constipation, BRBPR, or melena. Breast: No mass, nodules, bulging, or retraction. No skin changes or inflammation. No nipple discharge. No lymphadenopathy. Genitourinary: No dysuria, hematuria, incontinence, vaginal discharge, pruritis, burning, abnormal bleeding, or pain. Musculoskeletal: No decreased ROM, No joint pain or swelling. No significant pain in neck, back, or extremities. Skin: No rash, pruritis, or concerning lesions. Neurological: No headache, dizziness, syncope, seizures, tremors, memory loss, coordination problems, or paresthesias. Psychological: No  hallucinations, SI/HI. Her anxiety and depression are controlled with  Meds.  Endocrine: No polydipsia, polyphagia, polyuria, or known diabetes.No increased fatigue. No palpitations/rapid heart rate. No significant/unexplained weight change. All other systems were reviewed and are otherwise negative.  Past Medical History  Diagnosis Date  . Hypertension   . Chronic back pain   . Hyperlipidemia   . GERD (gastroesophageal reflux disease)   . Hiatal hernia   . Pelvic fracture 12/12/2012  . Smoker      Past  Surgical History  Procedure Laterality Date  . Lumbar spine surgery    . 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 Hemorroidectomy    . Appendectomy    . Abdominal hysterectomy      Home Meds:  Outpatient Prescriptions Prior to Visit  Medication Sig Dispense Refill  . ALPRAZolam (XANAX) 0.5 MG tablet TAKE ONE TABLET BY MOUTH ONCE A DAY AS NEEDED FOR ANXIETY  30 tablet  0  . amLODipine (NORVASC) 5 MG tablet Take 1 tablet (5 mg total) by mouth daily.  90 tablet  3  . atorvastatin (LIPITOR) 20 MG tablet TAKE ONE TABLET AT BEDTIME  30 tablet  3  . carisoprodol (SOMA) 350 MG tablet TAKE ONE TABLET THREE TIMES DAILY  90 tablet  2  . gabapentin (NEURONTIN) 600 MG tablet TAKE ONE (1) TABLET THREE (3) TIMES EACH DAY  90 tablet  3  . Multiple Vitamin (MULTIVITAMIN) tablet Take 1 tablet by mouth daily.      . Oxycodone HCl 10 MG TABS Take 1 tablet (10 mg total) by mouth every 4 (four) hours as needed.  90 tablet  0  . oxyCODONE-acetaminophen (PERCOCET) 10-325 MG per tablet Take 1 tablet by mouth every 8 (eight) hours as needed for pain. One every 8 hours as needed for pain.  90 tablet  0  . pantoprazole (PROTONIX) 40 MG tablet TAKE ONE TABLET ONCE DAILY  30 tablet  4  . potassium chloride (K-DUR) 10 MEQ tablet Take 10 mEq by mouth daily.      . sertraline (ZOLOFT) 50 MG tablet Take 1 tablet (50 mg total) by mouth daily.  30 tablet  3   No facility-administered medications prior to visit.    Allergies:  Allergies  Allergen Reactions  . Nicoderm [Nicotine]     Site rash    History   Social History  .  Marital Status: Divorced    Spouse Name: N/A    Number of Children: N/A  . Years of Education: N/A   Occupational History  . Not on file.   Social History Main Topics  . Smoking status: Former Smoker    Types: Cigarettes  . Smokeless tobacco: Former Neurosurgeon    Quit date: 12/28/2012  . Alcohol Use: No  . Drug Use: No  . Sexual Activity: Not on file   Other Topics Concern  . Not on file   Social  History Narrative  . No narrative on file    No family history on file.  Physical Exam: Blood pressure 140/86, pulse 84, temperature 98.6 F (37 C), resp. rate 20, height 5\' 1"  (1.549 m), weight 169 lb (76.658 kg)., Body mass index is 31.95 kg/(m^2). General: Overweight WF. Appears in no acute distress. HEENT: Normocephalic, atraumatic. Conjunctiva pink, sclera non-icteric. Pupils 2 mm constricting to 1 mm, round, regular, and equally reactive to light and accomodation. EOMI. Internal auditory canal clear. TMs with good cone of light and without pathology. Nasal mucosa pink. Nares are without discharge. No sinus tenderness. Oral mucosa pink.  Pharynx without exudate.   Neck: Supple. Trachea midline. No thyromegaly. Full ROM. No lymphadenopathy.No Carotid Bruits. Lungs: Clear to auscultation bilaterally without wheezes, rales, or rhonchi. Breathing is of normal effort and unlabored. Cardiovascular: RRR with S1 S2. No murmurs, rubs, or gallops. Distal pulses 2+ symmetrically. No carotid or abdominal bruits. Breast: She defers Breast Exam.  Abdomen: Soft, non-tender, non-distended with normoactive bowel sounds. No hepatosplenomegaly. No rebound/guarding. No CVA tenderness. She does have hernia on right mid/upper abdomen--very soft.  Genitourinary:  Pt Defers Pelvic Exam.  Musculoskeletal: Full range of motion and 5/5 strength throughout. Without swelling, atrophy, tenderness, crepitus, or warmth. Extremities without clubbing, cyanosis, or edema. Calves supple. Skin: Warm and moist without erythema, ecchymosis, wounds, or rash. Neuro: A+Ox3. CN II-XII grossly intact. Moves all extremities spontaneously. Full sensation throughout. Normal gait.  Psych:  Responds to questions appropriately with a normal affect.   Assessment/Plan:  73 y.o. y/o female here for CPE  Visit for preventive health examination  A. Screening Labs: She is fasting. Will check screening labs. - CBC with Differential -  COMPLETE METABOLIC PANEL WITH GFR - Lipid panel - TSH  B. Pap: She has history of hysterectomy. This was not performed for cancer. Therefore, further Pap smear not indicated. Today I recommended doing a pelvic exam but she deferred.  C. Screening Mammogram: She says that her last mammogram was in the 1990s. She refuses followup mammogram. I discussed risks versus benefits of the procedure and discussed that this could detect a small cancer. She still refuses.  D. DEXA/BMD:  She reports that she has never had a bone density test. I discussed reasons to do a bone density test. She still refuses to have bone density test.  E. Colorectal Cancer Screening: She states that her last colonoscopy was approximately 6 years ago. This was performed in [place] prior to her moving here with her daughter. Patient says that this was normal.  F. Immunizations:  Influenza: N/A Tetanus: I do not have her record of her last tetanus. However I will not give this today as it usually is not covered by Medicare. Pneumococcal: She has had no pneumonia vaccine. Will give Prevnar 13 today. 6-12 months later she will get Pneumovax 23. Zostavax: She has not had a Zostavax but is interested in having this. I have discussed the process with her.  I went ahead and sent order to her pharmacy she is to discuss the cost with her pharmacy prior to receiving this. If she is agreeable with the cost, then she will have the immunization administered there at the pharmacy. - Varicella-zoster vaccine subcutaneous  Signed, [address]Lawanna Cecere Beth ChililiDixon, [place], [facility] 10/24/2013 10:55 AM

## 2013-10-25 ENCOUNTER — Encounter: Payer: Self-pay | Admitting: *Deleted

## 2013-10-28 ENCOUNTER — Telehealth: Payer: Self-pay | Admitting: Physician Assistant

## 2013-10-28 DIAGNOSIS — Z23 Encounter for immunization: Secondary | ICD-10-CM

## 2013-10-28 NOTE — Telephone Encounter (Signed)
Please advise 

## 2013-10-28 NOTE — Telephone Encounter (Signed)
Patient is calling to say that when she was here last week we told her we were going to send in rx for increased amount of xanax and also an rx for the shingles shot she said these were not at her pharmacy [phone]    Pharmacy [place] [facility]

## 2013-10-29 MED ORDER — ALPRAZOLAM 0.5 MG PO TABS: ORAL_TABLET | ORAL | Status: DC

## 2013-10-29 NOTE — Telephone Encounter (Signed)
Call placed to patient to make aware.   Medication called to pharmacy.  Pharmacy states that they do not carry Zostavax.   [facility]

## 2013-10-29 NOTE — Telephone Encounter (Signed)
Send Rx for the Zostavax I did not agree to increase her Xanax. Can give her refill on her current dose which is Xanax 0.5 mg 1 by mouth daily when necessary #30+2 additional refills--Tell Pharamacy each Rx to last 30 days-not to fill early.

## 2013-11-07 ENCOUNTER — Other Ambulatory Visit: Payer: Self-pay | Admitting: Physician Assistant

## 2013-11-11 ENCOUNTER — Other Ambulatory Visit: Payer: Self-pay | Admitting: Physician Assistant

## 2013-11-11 NOTE — Telephone Encounter (Signed)
Xanax has been called in to [facility] in [place].

## 2013-11-20 ENCOUNTER — Telehealth: Payer: Self-pay | Admitting: *Deleted

## 2013-11-20 DIAGNOSIS — G8929 Other chronic pain: Secondary | ICD-10-CM

## 2013-11-20 DIAGNOSIS — M549 Dorsalgia, unspecified: Principal | ICD-10-CM

## 2013-11-20 NOTE — Telephone Encounter (Signed)
Pt called stating she is needing referral to pain mgmt Dr. Laurian Brim office for chronic pain, ?ok to do referral

## 2013-11-21 NOTE — Telephone Encounter (Signed)
Referral place in Referral wq

## 2013-11-21 NOTE — Telephone Encounter (Signed)
Yes

## 2013-12-10 ENCOUNTER — Telehealth: Payer: Self-pay | Admitting: Family Medicine

## 2013-12-10 NOTE — Telephone Encounter (Signed)
Wants something more nerves.  The once a day Xanax is not enough.

## 2013-12-11 MED ORDER — SERTRALINE HCL 100 MG PO TABS: 100.0000 mg | ORAL_TABLET | Freq: Every day | ORAL | Status: DC

## 2013-12-11 NOTE — Telephone Encounter (Signed)
Pt called and made aware of medication adjustment and 6 week appt made

## 2013-12-11 NOTE — Telephone Encounter (Signed)
She is currently on Zoloft 50 mg. Tell her to increase this to 100 mg. Tell her it will take time/weeks for this  "to kick in." Have her schedule followup office visit for about 6 weeks from now. Send prescription for Zoloft 100 mg 1 by mouth daily #30 with 1 refill

## 2014-01-10 ENCOUNTER — Other Ambulatory Visit: Payer: Self-pay | Admitting: Physician Assistant

## 2014-01-10 NOTE — Telephone Encounter (Signed)
?  ok to refill  Lov 10/24/13  Last rf ?

## 2014-01-13 ENCOUNTER — Other Ambulatory Visit: Payer: Self-pay | Admitting: Physician Assistant

## 2014-01-13 NOTE — Telephone Encounter (Signed)
Approved for #90+2.  

## 2014-01-13 NOTE — Telephone Encounter (Signed)
RX sent

## 2014-01-14 ENCOUNTER — Other Ambulatory Visit: Payer: Self-pay | Admitting: Physician Assistant

## 2014-01-14 NOTE — Telephone Encounter (Signed)
Medication refilled per protocol. 

## 2014-01-23 ENCOUNTER — Ambulatory Visit: Payer: Self-pay | Admitting: Physician Assistant

## 2014-02-10 ENCOUNTER — Emergency Department (HOSPITAL_COMMUNITY)
Admission: EM | Admit: 2014-02-10 | Discharge: 2014-02-10 | Disposition: A | Discharge status: Home / Self Care | Payer: No Typology Code available for payment source | Attending: Emergency Medicine | Admitting: Emergency Medicine

## 2014-02-10 ENCOUNTER — Emergency Department (HOSPITAL_COMMUNITY): Payer: No Typology Code available for payment source

## 2014-02-10 ENCOUNTER — Encounter (HOSPITAL_COMMUNITY): Payer: Self-pay | Admitting: Emergency Medicine

## 2014-02-10 ENCOUNTER — Other Ambulatory Visit: Payer: Self-pay | Admitting: Physician Assistant

## 2014-02-10 DIAGNOSIS — S4991XA Unspecified injury of right shoulder and upper arm, initial encounter: Secondary | ICD-10-CM | POA: Diagnosis not present

## 2014-02-10 DIAGNOSIS — Z79899 Other long term (current) drug therapy: Secondary | ICD-10-CM | POA: Diagnosis not present

## 2014-02-10 DIAGNOSIS — Y92410 Unspecified street and highway as the place of occurrence of the external cause: Secondary | ICD-10-CM | POA: Diagnosis not present

## 2014-02-10 DIAGNOSIS — S161XXA Strain of muscle, fascia and tendon at neck level, initial encounter: Secondary | ICD-10-CM | POA: Diagnosis not present

## 2014-02-10 DIAGNOSIS — S39012A Strain of muscle, fascia and tendon of lower back, initial encounter: Secondary | ICD-10-CM | POA: Insufficient documentation

## 2014-02-10 DIAGNOSIS — K219 Gastro-esophageal reflux disease without esophagitis: Secondary | ICD-10-CM | POA: Insufficient documentation

## 2014-02-10 DIAGNOSIS — Z87891 Personal history of nicotine dependence: Secondary | ICD-10-CM | POA: Insufficient documentation

## 2014-02-10 DIAGNOSIS — Y9389 Activity, other specified: Secondary | ICD-10-CM | POA: Diagnosis not present

## 2014-02-10 DIAGNOSIS — Y998 Other external cause status: Secondary | ICD-10-CM | POA: Insufficient documentation

## 2014-02-10 DIAGNOSIS — M25511 Pain in right shoulder: Secondary | ICD-10-CM

## 2014-02-10 DIAGNOSIS — Z8781 Personal history of (healed) traumatic fracture: Secondary | ICD-10-CM | POA: Diagnosis not present

## 2014-02-10 DIAGNOSIS — I10 Essential (primary) hypertension: Secondary | ICD-10-CM | POA: Diagnosis not present

## 2014-02-10 DIAGNOSIS — Z8719 Personal history of other diseases of the digestive system: Secondary | ICD-10-CM | POA: Diagnosis not present

## 2014-02-10 DIAGNOSIS — S3992XA Unspecified injury of lower back, initial encounter: Secondary | ICD-10-CM | POA: Diagnosis present

## 2014-02-10 DIAGNOSIS — E785 Hyperlipidemia, unspecified: Secondary | ICD-10-CM | POA: Insufficient documentation

## 2014-02-10 MED ORDER — DIAZEPAM 2 MG PO TABS: 2.0000 mg | ORAL_TABLET | Freq: Three times a day (TID) | ORAL | Status: DC | PRN

## 2014-02-10 MED ORDER — DIAZEPAM 2 MG PO TABS
2.0000 mg | ORAL_TABLET | Freq: Once | ORAL | Status: AC
  Administered 2014-02-10: 2 mg via ORAL
  Filled 2014-02-10: qty 1

## 2014-02-10 NOTE — Discharge Instructions (Signed)
Back Pain, Adult °Back pain is very common. The pain often gets better over time. The cause of back pain is usually not dangerous. Most people can learn to manage their back pain on their own.  °HOME CARE  °· Stay active. Start with short walks on flat ground if you can. Try to walk farther each day. °· Do not sit, drive, or stand in one place for more than 30 minutes. Do not stay in bed. °· Do not avoid exercise or work. Activity can help your back heal faster. °· Be careful when you bend or lift an object. Bend at your knees, keep the object close to you, and do not twist. °· Sleep on a firm mattress. Lie on your side, and bend your knees. If you lie on your back, put a pillow under your knees. °· Only take medicines as told by your doctor. °· Put ice on the injured area. °¨ Put ice in a plastic bag. °¨ Place a towel between your skin and the bag. °¨ Leave the ice on for 15-20 minutes, 03-04 times a day for the first 2 to 3 days. After that, you can switch between ice and heat packs. °· Ask your doctor about back exercises or massage. °· Avoid feeling anxious or stressed. Find good ways to deal with stress, such as exercise. °GET HELP RIGHT AWAY IF:  °· Your pain does not go away with rest or medicine. °· Your pain does not go away in 1 week. °· You have new problems. °· You do not feel well. °· The pain spreads into your legs. °· You cannot control when you poop (bowel movement) or pee (urinate). °· Your arms or legs feel weak or lose feeling (numbness). °· You feel sick to your stomach (nauseous) or throw up (vomit). °· You have belly (abdominal) pain. °· You feel like you may pass out (faint). °MAKE SURE YOU:  °· Understand these instructions. °· Will watch your condition. °· Will get help right away if you are not doing well or get worse. °Document Released: 08/17/2007 Document Revised: 05/23/2011 Document Reviewed: 07/02/2013 °ExitCare® Patient Information ©2015 [employer]. This information is not intended  to replace advice given to you by your health care provider. Make sure you discuss any questions you have with your health care provider. ° °Cervical Sprain °A cervical sprain is when the tissues (ligaments) that hold the neck bones in place stretch or tear. °HOME CARE  °· Put ice on the injured area. °¨ Put ice in a plastic bag. °¨ Place a towel between your skin and the bag. °¨ Leave the ice on for 15-20 minutes, 3-4 times a day. °· You may have been given a collar to wear. This collar keeps your neck from moving while you heal. °¨ Do not take the collar off unless told by your doctor. °¨ If you have long hair, keep it outside of the collar. °¨ Ask your doctor before changing the position of your collar. You may need to change its position over time to make it more comfortable. °¨ If you are allowed to take off the collar for cleaning or bathing, follow your doctor's instructions on how to do it safely. °¨ Keep your collar clean by wiping it with mild soap and water. Dry it completely. If the collar has removable pads, remove them every 1-2 days to hand wash them with soap and water. Allow them to air dry. They should be dry before you wear them in   the collar. °¨ Do not drive while wearing the collar. °· Only take medicine as told by your doctor. °· Keep all doctor visits as told. °· Keep all physical therapy visits as told. °· Adjust your work station so that you have good posture while you work. °· Avoid positions and activities that make your problems worse. °· Warm up and stretch before being active. °GET HELP IF: °· Your pain is not controlled with medicine. °· You cannot take less pain medicine over time as planned. °· Your activity level does not improve as expected. °GET HELP RIGHT AWAY IF:  °· You are bleeding. °· Your stomach is upset. °· You have an allergic reaction to your medicine. °· You develop new problems that you cannot explain. °· You lose feeling (become numb) or you cannot move any part of  your body (paralysis). °· You have tingling or weakness in any part of your body. °· Your symptoms get worse. Symptoms include: °¨ Pain, soreness, stiffness, puffiness (swelling), or a burning feeling in your neck. °¨ Pain when your neck is touched. °¨ Shoulder or upper back pain. °¨ Limited ability to move your neck. °¨ Headache. °¨ Dizziness. °¨ Your hands or arms feel week, lose feeling, or tingle. °¨ Muscle spasms. °¨ Difficulty swallowing or chewing. °MAKE SURE YOU:  °· Understand these instructions. °· Will watch your condition. °· Will get help right away if you are not doing well or get worse. °Document Released: 08/17/2007 Document Revised: 10/31/2012 Document Reviewed: 09/05/2012 °ExitCare® Patient Information ©2015 [employer]. This information is not intended to replace advice given to you by your health care provider. Make sure you discuss any questions you have with your health care provider. ° °
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
## 2014-02-10 NOTE — ED Notes (Signed)
Pt reports she took an Oxycodone roughly 20 minutes ago.

## 2014-02-11 NOTE — Telephone Encounter (Signed)
Last Rf 8/18 #30 + 2  LOV 10/24/13  OK refill?

## 2014-02-12 NOTE — Telephone Encounter (Signed)
Approved for #30+2 additional refills. Each prescription must last a full 30 days .

## 2014-02-12 NOTE — Telephone Encounter (Signed)
Called in.

## 2014-02-13 ENCOUNTER — Other Ambulatory Visit: Payer: Self-pay | Admitting: Physician Assistant

## 2014-02-13 DIAGNOSIS — I10 Essential (primary) hypertension: Secondary | ICD-10-CM

## 2014-02-13 NOTE — Telephone Encounter (Signed)
Medication refilled per protocol. 

## 2014-02-20 ENCOUNTER — Telehealth: Payer: Self-pay | Admitting: Physician Assistant

## 2014-02-20 MED ORDER — FLUOXETINE HCL 20 MG PO TABS: 20.0000 mg | ORAL_TABLET | Freq: Every day | ORAL | Status: DC

## 2014-02-20 NOTE — Telephone Encounter (Signed)
Currently Xanax is just once a day.  Not increasing to 3 a day.  I recommend trying a different SSRI to replace Zoloft to control her anxiety. Rx Prozac 20mg  one po QD # 30 + 1.  F/U OV 6 weeks.

## 2014-02-20 NOTE — Telephone Encounter (Signed)
Pt aware of provider recommendations.  RX to pharmacy.  Has appt for January

## 2014-02-20 NOTE — Telephone Encounter (Signed)
Stopped taking Zoloft.  It made her feel funny  "Stupid feeling"  Wants xanax increased to three times a day.  Is not sleeping.  States is taking nothing for sleep.  Having much more increased anxiety!!

## 2014-02-20 NOTE — Telephone Encounter (Signed)
Patient is calling to see if by chance she could get 2 or 3 xanax's to help her sleep if possible. She is having a lot of anxiety.    [facility]

## 2014-03-27 ENCOUNTER — Ambulatory Visit: Payer: Medicare Other | Admitting: Physician Assistant

## 2014-04-08 ENCOUNTER — Other Ambulatory Visit: Payer: Self-pay | Admitting: Physician Assistant

## 2014-04-08 DIAGNOSIS — M79661 Pain in right lower leg: Secondary | ICD-10-CM | POA: Diagnosis not present

## 2014-04-08 DIAGNOSIS — M79604 Pain in right leg: Secondary | ICD-10-CM | POA: Diagnosis not present

## 2014-04-08 DIAGNOSIS — G8929 Other chronic pain: Secondary | ICD-10-CM | POA: Diagnosis not present

## 2014-04-08 DIAGNOSIS — G603 Idiopathic progressive neuropathy: Secondary | ICD-10-CM | POA: Diagnosis not present

## 2014-04-08 DIAGNOSIS — M545 Low back pain: Secondary | ICD-10-CM | POA: Diagnosis not present

## 2014-04-08 DIAGNOSIS — M79605 Pain in left leg: Secondary | ICD-10-CM | POA: Diagnosis not present

## 2014-04-09 NOTE — Telephone Encounter (Signed)
Medication refilled per protocol. 

## 2014-04-10 DIAGNOSIS — Z79891 Long term (current) use of opiate analgesic: Secondary | ICD-10-CM | POA: Diagnosis not present

## 2014-04-10 DIAGNOSIS — M5136 Other intervertebral disc degeneration, lumbar region: Secondary | ICD-10-CM | POA: Diagnosis not present

## 2014-04-10 DIAGNOSIS — M961 Postlaminectomy syndrome, not elsewhere classified: Secondary | ICD-10-CM | POA: Diagnosis not present

## 2014-04-10 DIAGNOSIS — M47816 Spondylosis without myelopathy or radiculopathy, lumbar region: Secondary | ICD-10-CM | POA: Diagnosis not present

## 2014-04-10 DIAGNOSIS — Z8781 Personal history of (healed) traumatic fracture: Secondary | ICD-10-CM | POA: Diagnosis not present

## 2014-04-13 ENCOUNTER — Other Ambulatory Visit: Payer: Self-pay | Admitting: Physician Assistant

## 2014-04-14 NOTE — Telephone Encounter (Signed)
Approved for #90+2.  

## 2014-04-14 NOTE — Telephone Encounter (Signed)
Refill sent.

## 2014-04-14 NOTE — Telephone Encounter (Signed)
LRF 11/2 #90 + 2.  LOV 10/24/13  OK refill?  has appt 2/17 for 6 mth visit

## 2014-04-18 ENCOUNTER — Ambulatory Visit: Payer: Medicare Other | Admitting: Family Medicine

## 2014-04-21 ENCOUNTER — Other Ambulatory Visit: Payer: Self-pay | Admitting: Physician Assistant

## 2014-04-21 NOTE — Telephone Encounter (Signed)
6 mth visit 2/17.  LRF alprazolam 12/2  #30 + 2.

## 2014-04-21 NOTE — Telephone Encounter (Signed)
Last Refill was 12/2---for #30 + 2 Additional Refills--should have filled 12/2, 1/2, 2/2. Should have had enough Refills to have just refilled 2/2.  This should hold her until OV.

## 2014-04-22 ENCOUNTER — Other Ambulatory Visit: Payer: Self-pay | Admitting: Physician Assistant

## 2014-04-23 NOTE — Telephone Encounter (Signed)
Refill denied just refilled tow days ago

## 2014-04-28 ENCOUNTER — Ambulatory Visit: Payer: Medicare Other | Admitting: Physician Assistant

## 2014-04-29 ENCOUNTER — Telehealth: Payer: Self-pay | Admitting: Family Medicine

## 2014-04-29 NOTE — Telephone Encounter (Signed)
Pharmacy now calling for refill of Alprazolam.  As per refill request from last week.  Is to early.  This pt to have no early refills.  Needs to keep appt tomorrow.

## 2014-04-29 NOTE — Telephone Encounter (Signed)
Agree 

## 2014-04-30 ENCOUNTER — Ambulatory Visit: Payer: Medicare Other | Admitting: Physician Assistant

## 2014-04-30 ENCOUNTER — Ambulatory Visit: Payer: Self-pay | Admitting: Physician Assistant

## 2014-05-05 ENCOUNTER — Ambulatory Visit: Payer: Medicare Other | Admitting: Physician Assistant

## 2014-05-07 DIAGNOSIS — Z8781 Personal history of (healed) traumatic fracture: Secondary | ICD-10-CM | POA: Diagnosis not present

## 2014-05-07 DIAGNOSIS — M47816 Spondylosis without myelopathy or radiculopathy, lumbar region: Secondary | ICD-10-CM | POA: Diagnosis not present

## 2014-05-07 DIAGNOSIS — M5136 Other intervertebral disc degeneration, lumbar region: Secondary | ICD-10-CM | POA: Diagnosis not present

## 2014-05-07 DIAGNOSIS — M961 Postlaminectomy syndrome, not elsewhere classified: Secondary | ICD-10-CM | POA: Diagnosis not present

## 2014-05-08 ENCOUNTER — Other Ambulatory Visit: Payer: Self-pay | Admitting: Physician Assistant

## 2014-05-08 NOTE — Telephone Encounter (Signed)
Ok to refill??  Last office visit 10/24/2013.  Last refill 02/12/2014, #2 refills.   Of note: patient has cancelled 5 appointments since  Last OV 10/2013.

## 2014-05-08 NOTE — Telephone Encounter (Signed)
Tell her she is due for routine 6 month OV. Schedule OV.

## 2014-05-08 NOTE — Telephone Encounter (Signed)
All refills denied.  Pt has canceled last 4 appointments.  When pharmacy tried to get early refill of Xanax on 04/29/14 was told no early refills and needs to keep appt.

## 2014-05-12 ENCOUNTER — Ambulatory Visit: Payer: Medicare Other | Admitting: Physician Assistant

## 2014-05-13 ENCOUNTER — Ambulatory Visit (INDEPENDENT_AMBULATORY_CARE_PROVIDER_SITE_OTHER): Payer: Medicare Other | Admitting: Physician Assistant

## 2014-05-13 ENCOUNTER — Encounter: Payer: Self-pay | Admitting: Physician Assistant

## 2014-05-13 VITALS — BP 162/98 | HR 82 | Temp 98.1°F | Resp 18 | Wt 182.0 lb

## 2014-05-13 DIAGNOSIS — Z720 Tobacco use: Secondary | ICD-10-CM | POA: Diagnosis not present

## 2014-05-13 DIAGNOSIS — G8929 Other chronic pain: Secondary | ICD-10-CM

## 2014-05-13 DIAGNOSIS — I10 Essential (primary) hypertension: Secondary | ICD-10-CM

## 2014-05-13 DIAGNOSIS — E785 Hyperlipidemia, unspecified: Secondary | ICD-10-CM | POA: Diagnosis not present

## 2014-05-13 DIAGNOSIS — K219 Gastro-esophageal reflux disease without esophagitis: Secondary | ICD-10-CM | POA: Diagnosis not present

## 2014-05-13 DIAGNOSIS — Z23 Encounter for immunization: Secondary | ICD-10-CM

## 2014-05-13 DIAGNOSIS — F411 Generalized anxiety disorder: Secondary | ICD-10-CM | POA: Diagnosis not present

## 2014-05-13 DIAGNOSIS — M549 Dorsalgia, unspecified: Secondary | ICD-10-CM

## 2014-05-13 DIAGNOSIS — F17200 Nicotine dependence, unspecified, uncomplicated: Secondary | ICD-10-CM

## 2014-05-13 DIAGNOSIS — G4700 Insomnia, unspecified: Secondary | ICD-10-CM | POA: Diagnosis not present

## 2014-05-13 MED ORDER — GABAPENTIN 600 MG PO TABS: ORAL_TABLET | ORAL | Status: DC

## 2014-05-13 MED ORDER — ALPRAZOLAM 0.5 MG PO TABS: ORAL_TABLET | ORAL | Status: DC

## 2014-05-13 MED ORDER — ZOLPIDEM TARTRATE 5 MG PO TABS: 5.0000 mg | ORAL_TABLET | Freq: Every evening | ORAL | Status: DC | PRN

## 2014-05-13 MED ORDER — AMLODIPINE BESYLATE 10 MG PO TABS: 10.0000 mg | ORAL_TABLET | Freq: Every day | ORAL | Status: DC

## 2014-05-13 MED ORDER — ATORVASTATIN CALCIUM 20 MG PO TABS: ORAL_TABLET | ORAL | Status: DC

## 2014-05-13 NOTE — Progress Notes (Signed)
Patient ID: Brenda Hopkins MRN: [id number], DOB: 04/24/1940, 74 y.o. Date of Encounter: @DATE @  Chief Complaint:  Chief Complaint  Patient presents with  . 4 MTH FOLLOW UP    feels BP meds needs to be increased and wants more xanax  . Medication Refill    HPI: 74 y.o. year old white female  presents for f/u OV.  Her first OV was with me on 01/28/13.  At that visit she was here with her daughter.  She had been living in [place] until she was recently hospitalized in [place] secondary to a pelvic fracture. At the time of her initial visit with me we do not have any records and all information was according to the patient and her daughter.  Since that fall and pelvic fracture she has moved here to where she can live with her daughter.  They reported that she was already on pain medications secondary to back pain prior to the most recent hospitalization.  She reported that she had had 2 back surgeries in 2009 after a fall. She reported that she had been on hydrocodone and then later on oxycodone for her back. Also was already on trazodone " to help her relax and sleep".  At her visit with me on 01/28/13 we discussed our clinic policy regarding chronic pain medications. I told her that we typically do not prescribe any pain medications until we have records to review. She reported she absolutely had to have some pain medications. Gave her #30 of Percocet to hold her over until she can have followup once we obtained records.  Did obtain records which did indicate that she had been on chronic pain medications even prior to the recent pelvic fracture.  She was then informed to keep her followup office visit for today but that she would also have to follow up with the pain clinic as we would not prescribe long-term pain meds.  She has had a multitude of phone calls with our office regarding changing medications over these months.   At her OV 07/2013--Her daughter was with her again  for that visit. Daughter did most of the talking. Both the daughter and the patient said that the patient is anxious all the time and worries about everything. Also she needed medicine for anxiety to help during the daytime too-not just night tome dose of klonopin.   They both reported that the patient is not sleeping well. Said that trazodone this isn't working very well--not sleeping well even with this med.   Patient sayid that she thought the main reason she couldn't sleep was because of muscle spasms.  She thought if she had some medicine for muscle spasms to use for this during the day as well as at night that this would help with the spasms as well as help her sleep. Daughter staid that soma works really well for her and both pt and daughter were requesting soma.  Also, at that visit-- the daughter says that she  uses Xanax herself that this works much better and thinks that we should use this instead of Klonopin.  Also, the patient shows me a note that she received from her insurance regarding scheduling a complete physical exam. They will  give her $15 gift card for doing the physical.  At that office visit 07/2013:  I stopped the clonazepam and the trazodone.  I prescribed Xanax 0.5 one daily when necessary #30+0 I prescribed Soma I prescribed Zoloft 50 mg 1 daily.  As well she was to schedule complete physical exam and come fasting to that appointment.  She did return fasting for complete physical exam 10/24/13.  TODAY--05/13/2014:   She makes mention that  "the Prozac made her feel disoriented and 'can't take those kinds of medicines' ." Been reviewed phone messages. 12/10/13 phone message reported that she wanted to increase Xanax to 3 times a day. At that time I had her increase Zoloft to 100 mg. Continued the Xanax at one time daily. 02/20/14 phone message states that she stopped Zoloft because it caused her to have a "stupid feeling."  Again was requesting that I prescribe  Xanax 3 times a day. At that time I prescribed Prozac and again continued the Xanax at daily.  Today patient states that the Prozac caused "the same thing happened when I took the Prozac----I can't take those medicines".  Says that she is getting no sleep. I then reviewed the last office visit note with her and the fact that at that visit she said that the main reason she wasn't sleeping was because the muscle spasms and she was requesting Soma and I did prescribe Soma. She says that Dr. Laurian Brim at the pain clinic stopped the [facility] and changed it to tizinidine. Says that she is not sleeping at all. Says that she has never tried any "sleep medicine "has never been on Ambien in the past.  Also says that when she was at Dr. Jon Gills last week at the pain clinic blood pressure was high at 178/148.     Past Medical History  Diagnosis Date  . Hypertension   . Chronic back pain   . Hyperlipidemia   . GERD (gastroesophageal reflux disease)   . Hiatal hernia   . Pelvic fracture 12/12/2012  . Smoker      Home Meds: Outpatient Prescriptions Prior to Visit  Medication Sig Dispense Refill  . atorvastatin (LIPITOR) 20 MG tablet TAKE ONE (1) TABLET AT BEDTIME 30 tablet 5  . gabapentin (NEURONTIN) 600 MG tablet TAKE ONE (1) TABLET THREE (3) TIMES EACH DAY 90 tablet 0  . Multiple Vitamin (MULTIVITAMIN) tablet Take 1 tablet by mouth daily.    Marland Kitchen oxyCODONE (ROXICODONE) 15 MG immediate release tablet Take 15 mg by mouth 4 (four) times daily.    . pantoprazole (PROTONIX) 40 MG tablet TAKE ONE TABLET ONCE DAILY 30 tablet 3  . ALPRAZolam (XANAX) 0.5 MG tablet TAKE ONE TABLET ONCE DAILY AS NEEDED 30 tablet 2  . amLODipine (NORVASC) 5 MG tablet TAKE ONE (1) TABLET BY MOUTH EVERY DAY 90 tablet 0  . carisoprodol (SOMA) 350 MG tablet TAKE ONE TABLET THREE TIMES DAILY 90 tablet 2  . diazepam (VALIUM) 2 MG tablet Take 1 tablet (2 mg total) by mouth every 8 (eight) hours as needed for anxiety or muscle spasms. 10  tablet 0  . doxylamine, Sleep, (SLEEP AID) 25 MG tablet Take 25-50 mg by mouth at bedtime as needed for sleep.    Marland Kitchen FLUoxetine (PROZAC) 20 MG capsule TAKE ONE (1) CAPSULE EACH DAY 30 capsule 0  . Oxycodone HCl 10 MG TABS Take 1 tablet (10 mg total) by mouth every 4 (four) hours as needed. (Patient not taking: Reported on 02/10/2014) 90 tablet 0  . oxyCODONE-acetaminophen (PERCOCET) 10-325 MG per tablet Take 1 tablet by mouth every 8 (eight) hours as needed for pain. One every 8 hours as needed for pain. (Patient not taking: Reported on 02/10/2014) 90 tablet 0   No facility-administered medications prior  to visit.     Allergies:  Allergies  Allergen Reactions  . Prozac [Fluoxetine Hcl] Other (See Comments)    Makes her fell crazy  . Nicoderm [Nicotine] Rash    Site rash    History   Social History  . Marital Status: Divorced    Spouse Name: N/A  . Number of Children: N/A  . Years of Education: N/A   Occupational History  . Not on file.   Social History Main Topics  . Smoking status: Former Smoker    Types: Cigarettes  . Smokeless tobacco: Former Neurosurgeon    Quit date: 12/28/2012  . Alcohol Use: No  . Drug Use: No  . Sexual Activity: Not on file   Other Topics Concern  . Not on file   Social History Narrative    History reviewed. No pertinent family history.   Review of Systems:  See HPI for pertinent ROS. All other ROS negative.    Physical Exam: Blood pressure 162/98, pulse 82, temperature 98.1 F (36.7 C), temperature source Oral, resp. rate 18, weight 182 lb (82.555 kg)., Body mass index is 35.54 kg/(m^2). General: Appears in no acute distress. Neck: Supple. No thyromegaly. No lymphadenopathy. No carotid bruits. Lungs: Clear bilaterally to auscultation without wheezes, rales, or rhonchi. Breathing is unlabored. Heart: RRR with S1 S2. No murmurs, rubs, or gallops. Abdomen: Soft, non-tender, non-distended with normoactive bowel sounds. No hepatomegaly. No  rebound/guarding. No obvious abdominal masses. Musculoskeletal:  Strength and tone normal for age. Extremities/Skin: Warm and dry.  No edema.  Neuro: Alert and oriented X 3. Moves all extremities spontaneously. Gait is normal. CNII-XII grossly in tact. Psych:  Responds to questions appropriately with a normal affect.     ASSESSMENT AND PLAN:  74 y.o. year old female with    1. Generalized anxiety disorder Intolerant to Zoloft and Prozac. Continue Xanax at current dose of 0.5 mg daily when necessary. Today I printed prescription for #30+2 additional refills. The prescription has note that states prescription must last full 30 days and will not fill early.  2. Essential hypertension Increase Norvasc from 5 mg to 10 mg. - amLODipine (NORVASC) 10 MG tablet; Take 1 tablet (10 mg total) by mouth daily.  Dispense: 30 tablet; Refill: 3 - COMPLETE METABOLIC PANEL WITH GFR  3. Hyperlipidemia She had lipid panel 10/2013 which was at goal. LDL was at 65. - COMPLETE METABOLIC PANEL WITH GFR  4. Smoker  5. Gastroesophageal reflux disease, esophagitis presence not specified  6. Chronic back pain Managed by Pain Clinic  7. Insomnia - zolpidem (AMBIEN) 5 MG tablet; Take 1 tablet (5 mg total) by mouth at bedtime as needed for sleep.  Dispense: 30 tablet; Refill: 1  8. Need for prophylactic vaccination against Streptococcus pneumoniae (pneumococcus) and influenza - Pneumococcal polysaccharide vaccine 23-valent greater than or equal to 2yo subcutaneous/IM - Flu Vaccine QUAD 36+ mos PF IM (Fluarix Quad PF)  SHE HAD CPE 10/2013. THE FOLLOWING IS COPIED FROM THAT NOTE:   Visit for preventive health examination  A. Screening Labs: She is fasting. Will check screening labs. - CBC with Differential - COMPLETE METABOLIC PANEL WITH GFR - Lipid panel - TSH  B. Pap: She has history of hysterectomy. This was not performed for cancer. Therefore, further Pap smear not indicated. Today I recommended  doing a pelvic exam but she deferred.  C. Screening Mammogram: She says that her last mammogram was in the 1990s. She refuses followup mammogram. I discussed risks versus benefits of  the procedure and discussed that this could detect a small cancer. She still refuses.  D. DEXA/BMD:  She reports that she has never had a bone density test. I discussed reasons to do a bone density test. She still refuses to have bone density test.  E. Colorectal Cancer Screening: She states that her last colonoscopy was approximately 6 years ago. This was performed in [place] prior to her moving here with her daughter. Patient says that this was normal.  F. Immunizations:  Influenza: N/A-----05/13/2014--- he shouldn't request to get the influenza vaccine today--- influenza vaccine given 05/13/14 Tetanus: I do not have her record of her last tetanus. However I will not give this today as it usually is not covered by Medicare. Pneumococcal: She has had no pneumonia vaccine. Will give Prevnar 13 today. 6-12 months later she will get Pneumovax 23. -----Prevnar 13 given 10/2013 -----Pneumovax 23 given 05/13/2014 Zostavax: She has not had a Zostavax but is interested in having this. I have discussed the process with her. I went ahead and sent order to her pharmacy she is to discuss the cost with her pharmacy prior to receiving this. If she is agreeable with the cost, then she will have the immunization administered there at the pharmacy. - Varicella-zoster vaccine subcutaneous At office visit 05/13/2014 asked about the Zostavax. He says that she goes to [facility] in [place] pharmacy said they did not have that vaccine so she did not follow-up about that any further.  She will have her blood pressure monitored and checked when she goes back to the pain clinic. If blood pressure is not controlled at that visit and she can come back to our office sooner. Otherwise we will wait 3 months for follow-up office visit  here.  Signed, [address] [place], [place], BSFM 10/24/2013  Signed, [address] [place], [place], [facility] 05/13/2014 5:11 PM

## 2014-05-14 LAB — COMPLETE METABOLIC PANEL WITH GFR
ALBUMIN: 3.9 g/dL (ref 3.5–5.2)
ALT: 13 U/L (ref 0–35)
AST: 20 U/L (ref 0–37)
Alkaline Phosphatase: 120 U/L — ABNORMAL HIGH (ref 39–117)
BUN: 18 mg/dL (ref 6–23)
CALCIUM: 9 mg/dL (ref 8.4–10.5)
CHLORIDE: 100 meq/L (ref 96–112)
CO2: 29 mEq/L (ref 19–32)
Creat: 0.82 mg/dL (ref 0.50–1.10)
GFR, Est African American: 83 mL/min
GFR, Est Non African American: 72 mL/min
Glucose, Bld: 117 mg/dL — ABNORMAL HIGH (ref 70–99)
POTASSIUM: 4.3 meq/L (ref 3.5–5.3)
Sodium: 139 mEq/L (ref 135–145)
Total Bilirubin: 0.5 mg/dL (ref 0.2–1.2)
Total Protein: 6.6 g/dL (ref 6.0–8.3)

## 2014-05-15 ENCOUNTER — Encounter: Payer: Self-pay | Admitting: Physician Assistant

## 2014-05-16 ENCOUNTER — Encounter: Payer: Self-pay | Admitting: Family Medicine

## 2014-05-20 ENCOUNTER — Telehealth: Payer: Self-pay | Admitting: Family Medicine

## 2014-05-20 DIAGNOSIS — G4701 Insomnia due to medical condition: Secondary | ICD-10-CM

## 2014-05-20 DIAGNOSIS — G8929 Other chronic pain: Principal | ICD-10-CM

## 2014-05-20 MED ORDER — TRAZODONE HCL 50 MG PO TABS: 50.0000 mg | ORAL_TABLET | Freq: Every evening | ORAL | Status: DC | PRN

## 2014-05-20 NOTE — Telephone Encounter (Signed)
Insurance concerned for use of Zolpidem in this patient due to age and adverse effects.  Per provider, switch to preferred Trazadone 50 mg HS prn.  Pt called.  Was fine to try Trazadone, said Zolpidem not working for her anyway, still up all night.  Zolpidem removed from med list and Trazadone sent to pharmacy.

## 2014-07-08 DIAGNOSIS — M961 Postlaminectomy syndrome, not elsewhere classified: Secondary | ICD-10-CM | POA: Diagnosis not present

## 2014-07-08 DIAGNOSIS — M5136 Other intervertebral disc degeneration, lumbar region: Secondary | ICD-10-CM | POA: Diagnosis not present

## 2014-07-08 DIAGNOSIS — Z79891 Long term (current) use of opiate analgesic: Secondary | ICD-10-CM | POA: Diagnosis not present

## 2014-07-08 DIAGNOSIS — M25511 Pain in right shoulder: Secondary | ICD-10-CM | POA: Diagnosis not present

## 2014-07-28 ENCOUNTER — Other Ambulatory Visit: Payer: Self-pay | Admitting: Physician Assistant

## 2014-07-28 NOTE — Telephone Encounter (Signed)
Medication refill per protocol °

## 2014-08-07 DIAGNOSIS — M25511 Pain in right shoulder: Secondary | ICD-10-CM | POA: Diagnosis not present

## 2014-08-07 DIAGNOSIS — S43011A Anterior subluxation of right humerus, initial encounter: Secondary | ICD-10-CM | POA: Diagnosis not present

## 2014-08-07 DIAGNOSIS — M19011 Primary osteoarthritis, right shoulder: Secondary | ICD-10-CM | POA: Diagnosis not present

## 2014-08-13 ENCOUNTER — Encounter: Payer: Self-pay | Admitting: Physician Assistant

## 2014-08-13 ENCOUNTER — Ambulatory Visit (INDEPENDENT_AMBULATORY_CARE_PROVIDER_SITE_OTHER): Payer: Medicare Other | Admitting: Physician Assistant

## 2014-08-13 VITALS — BP 130/80 | HR 65 | Temp 97.9°F | Resp 19 | Wt 179.0 lb

## 2014-08-13 DIAGNOSIS — Z720 Tobacco use: Secondary | ICD-10-CM

## 2014-08-13 DIAGNOSIS — E785 Hyperlipidemia, unspecified: Secondary | ICD-10-CM | POA: Diagnosis not present

## 2014-08-13 DIAGNOSIS — S329XXS Fracture of unspecified parts of lumbosacral spine and pelvis, sequela: Secondary | ICD-10-CM

## 2014-08-13 DIAGNOSIS — K219 Gastro-esophageal reflux disease without esophagitis: Secondary | ICD-10-CM | POA: Diagnosis not present

## 2014-08-13 DIAGNOSIS — M549 Dorsalgia, unspecified: Secondary | ICD-10-CM

## 2014-08-13 DIAGNOSIS — I10 Essential (primary) hypertension: Secondary | ICD-10-CM | POA: Diagnosis not present

## 2014-08-13 DIAGNOSIS — G4700 Insomnia, unspecified: Secondary | ICD-10-CM | POA: Diagnosis not present

## 2014-08-13 DIAGNOSIS — G8929 Other chronic pain: Secondary | ICD-10-CM

## 2014-08-13 DIAGNOSIS — F411 Generalized anxiety disorder: Secondary | ICD-10-CM | POA: Diagnosis not present

## 2014-08-13 DIAGNOSIS — F17200 Nicotine dependence, unspecified, uncomplicated: Secondary | ICD-10-CM

## 2014-08-13 DIAGNOSIS — K449 Diaphragmatic hernia without obstruction or gangrene: Secondary | ICD-10-CM

## 2014-08-13 MED ORDER — ALPRAZOLAM 0.5 MG PO TABS: 0.5000 mg | ORAL_TABLET | Freq: Two times a day (BID) | ORAL | Status: DC | PRN

## 2014-08-13 NOTE — Progress Notes (Signed)
Patient ID: Brenda Hopkins MRN: [id number], DOB: [birth year], 74 y.o. Date of Encounter: @  Chief Complaint:  Chief Complaint  Patient presents with  . Follow up    HPI: 74 y.o. year old white female  presents for f/u OV.  Her first OV was with me on 01/28/13.  At that visit she was here with her daughter.  She had been living in [place] until she was recently hospitalized in [place] secondary to a pelvic fracture. At the time of her initial visit with me we do not have any records and all information was according to the patient and her daughter.  Since that fall and pelvic fracture she has moved here to where she can live with her daughter.  They reported that she was already on pain medications secondary to back pain prior to the most recent hospitalization.  She reported that she had had 2 back surgeries in 2009 after a fall. She reported that she had been on hydrocodone and then later on oxycodone for her back. Also was already on trazodone " to help her relax and sleep".  At her visit with me on 01/28/13 we discussed our clinic policy regarding chronic pain medications. I told her that we typically do not prescribe any pain medications until we have records to review. She reported she absolutely had to have some pain medications. Gave her #30 of Percocet to hold her over until she can have followup once we obtained records.  Did obtain records which did indicate that she had been on chronic pain medications even prior to the recent pelvic fracture.  She was then informed to keep her followup office visit here,  but that she would also have to follow up with the pain clinic as we would not prescribe long-term pain meds.  Over the following several months, she had a multitude of phone calls with our office regarding changing medications.  At her OV 72/2015--Her daughter was with her again for that visit. Daughter did most of the talking. Both the daughter and the  patient said that the patient is anxious all the time and worries about everything. Also she needed medicine for anxiety to help during the daytime too-not just night tome dose of klonopin.   They both reported that the patient is not sleeping well. Said that trazodone isn't working very well--not sleeping well even with this med.   Patient said that she thought the main reason she couldn't sleep was because of muscle spasms.  She thought if she had some medicine for muscle spasms to use for this during the day as well as at night that this would help with the spasms as well as help her sleep. Daughter said that soma works really well for her and both pt and daughter were requesting soma.  Also, at that visit-- the daughter said that she uses Xanax herself that this works much better and thinks that we should use this instead of Klonopin.  Also, the patient shows me a note that she received from her insurance regarding scheduling a complete physical exam. They will  give her $15 gift card for doing the physical.  At that office visit 07/2013:  I stopped the clonazepam and the trazodone.  I prescribed Xanax 0.5 one daily when necessary #30+0 I prescribed Soma I prescribed Zoloft 50 mg 1 daily.  As well she was to schedule complete physical exam and come fasting to that appointment.  She did return fasting for complete  physical exam 10/24/13.  At OV-72/03/2014:   She makes mention that  "the Prozac made her feel disoriented and 'can't take those kinds of medicines' ." I then reviewed phone messages. 12/10/13 phone message reported that she wanted to increase Xanax to 3 times a day. At that time I had her increase Zoloft to 100 mg. Continued the Xanax at one time daily. 02/20/14 phone message states that she stopped Zoloft because it caused her to have a "stupid feeling."  Again was requesting that I prescribe Xanax 3 times a day. At that time I prescribed Prozac and again continued the Xanax at  daily.  At OV 05/13/14-- patient said that the Prozac caused "the same thing happened when I took the Prozac----I can't take those medicines".  Said that she was getting no sleep. I then reviewed the last office visit note with her and the fact that at that visit she said that the main reason she wasn't sleeping was because the muscle spasms and she was requesting Soma and I did prescribe Soma. She said that [facility] and [facility] it to tizinidine.  Said that she was not sleeping at all. Said that she had never tried any "sleep medicine."  York SpanielSaid had never been on Ambien in the past.  Also said that when she was at Dr. Jon Gills'Toole's last week at the pain clinic blood pressure was high at 178/148.  AT THAT OV 05/13/2014: --Continued Xanax #30 +2 and informed that must last full month --Rxed Ambien 5mg  Increased Norvasc from 5mg  to 10mg .   08/13/2014: A says states that she did increase the Norvasc to 10 mg. Says that when she has been to the pain clinic her blood pressure has been 120/80 and 140/80. Visit her blood pressure has come down and is meant much better. She is having no adverse effects with the 10 mg Norvasc. No lower extremity edema. Several times throughout the visit she keeps repeating "if I just had something for my nerves ". Says that she feels like she needs another Xanax during the day. Also says that the medicine for sleep is helping but still has some trouble sleeping. I reviewed that because of insurance coverage issues etc., Ambien was changed to trazodone 50 mg daily at bedtime on 05/20/14. See that phone message for documentation. No other complaints or concerns today.     Past Medical History  Diagnosis Date  . Hypertension   . Chronic back pain   . Hyperlipidemia   . GERD (gastroesophageal reflux disease)   . Hiatal hernia   . Pelvic fracture 12/12/2012  . Smoker      Home Meds: Outpatient Prescriptions Prior to Visit  Medication Sig  Dispense Refill  . ALPRAZolam (XANAX) 0.5 MG tablet TAKE ONE TABLET ONCE DAILY AS NEEDED 30 tablet 2  . amLODipine (NORVASC) 10 MG tablet Take 1 tablet (10 mg total) by mouth daily. 30 tablet 3  . atorvastatin (LIPITOR) 20 MG tablet TAKE ONE (1) TABLET AT BEDTIME 30 tablet 5  . gabapentin (NEURONTIN) 600 MG tablet TAKE ONE (1) TABLET THREE (3) TIMES EACH DAY 90 tablet 5  . Multiple Vitamin (MULTIVITAMIN) tablet Take 1 tablet by mouth daily.    Marland Kitchen. oxyCODONE (ROXICODONE) 15 MG immediate release tablet Take 15 mg by mouth 4 (four) times daily.    . pantoprazole (PROTONIX) 40 MG tablet TAKE ONE (1) TABLET EACH DAY 30 tablet 0  . TIZANIDINE HCL PO Take by mouth  3 (three) times daily as needed. Pt not sure of dose    . traZODone (DESYREL) 50 MG tablet Take 1 tablet (50 mg total) by mouth at bedtime as needed for sleep. 30 tablet 3   No facility-administered medications prior to visit.     Allergies:  Allergies  Allergen Reactions  . Prozac [Fluoxetine Hcl] Other (See Comments)    Makes her fell crazy  . Zoloft [Sertraline Hcl]     Made her feel crazy--10/2013  . Nicoderm [Nicotine] Rash    Site rash    History   Social History  . Marital Status: Divorced    Spouse Name: N/A  . Number of Children: N/A  . Years of Education: N/A   Occupational History  . Not on file.   Social History Main Topics  . Smoking status: Former Smoker    Types: Cigarettes  . Smokeless tobacco: Former Neurosurgeon    Quit date: 12/28/2012  . Alcohol Use: No  . Drug Use: No  . Sexual Activity: Not on file   Other Topics Concern  . Not on file   Social History Narrative    History reviewed. No pertinent family history.   Review of Systems:  See HPI for pertinent ROS. All other ROS negative.    Physical Exam: Blood pressure 130/80, pulse 65, temperature 97.9 F (36.6 C), temperature source Oral, resp. rate 19, weight 179 lb (81.194 kg)., Body mass index is 34.96 kg/(m^2). General: Overweight white  female. Does appear anxious and nervous. Appears in no acute distress. Neck: Supple. No thyromegaly. No lymphadenopathy. No carotid bruits. Lungs: Clear bilaterally to auscultation without wheezes, rales, or rhonchi. Breathing is unlabored. Heart: RRR with S1 S2. No murmurs, rubs, or gallops. Abdomen: Soft, non-tender, non-distended with normoactive bowel sounds. No hepatomegaly. No rebound/guarding. No obvious abdominal masses. Musculoskeletal:  Strength and tone normal for age. Extremities/Skin: Warm and dry.  No edema.  Neuro: Alert and oriented X 3. Moves all extremities spontaneously. Gait is normal. CNII-XII grossly in tact. Psych:  Responds to questions appropriately with a normal affect. does appear anxious and nervous.      ASSESSMENT AND PLAN:  74 y.o. year old female with    1. Generalized anxiety disorder Intolerant to Zoloft and Prozac. I will go ahead and increase her Xanax to where she can use 1 twice daily if needed. Continue the dose to 0.5 mg. Today I printed prescription for #60+2 additional refills And again told her that I will not fill early and that each prescription of 60 must last a full month.  2. Essential hypertension Blood pressure is much improved since increasing Norvasc at last visit. Continue current medications now. Last CMET was 05/13/14. Can wait to repeat lab.  3. Hyperlipidemia She had lipid panel 10/2013 which was at goal. LDL was at 65. - COMPLETE METABOLIC PANEL WITH GFR  4. Smoker  5. Gastroesophageal reflux disease, esophagitis presence not specified  6. Chronic back pain Managed by Pain Clinic  7. Insomnia -At visit 3/16 are prescribed Ambien. The phone message 05/20/14--- changed to trazodone 50 mg daily at bedtime. Continue this.   SHE HAD CPE 10/2013. THE FOLLOWING IS COPIED FROM THAT NOTE:   Visit for preventive health examination  A. Screening Labs: She is fasting. Will check screening labs. - CBC with Differential - COMPLETE  METABOLIC PANEL WITH GFR - Lipid panel - TSH  B. Pap: She has history of hysterectomy. This was not performed for cancer. Therefore, further Pap smear  not indicated. Today I recommended doing a pelvic exam but she deferred.  C. Screening Mammogram: She says that her last mammogram was in the 1990s. She refuses followup mammogram. I discussed risks versus benefits of the procedure and discussed that this could detect a small cancer. She still refuses.  D. DEXA/BMD:  She reports that she has never had a bone density test. I discussed reasons to do a bone density test. She still refuses to have bone density test.  E. Colorectal Cancer Screening: She states that her last colonoscopy was approximately 6 years ago. This was performed in [place] prior to her moving here with her daughter. Patient says that this was normal.  F. Immunizations:  Influenza: N/A-----05/13/2014--- he shouldn't request to get the influenza vaccine today--- influenza vaccine given 05/13/14 Tetanus: I do not have her record of her last tetanus. However I will not give this today as it usually is not covered by Medicare. Pneumococcal: She has had no pneumonia vaccine. Will give Prevnar 13 today. 6-12 months later she will get Pneumovax 23. -----Prevnar 13 given 10/2013 -----Pneumovax 23 given 05/13/2014 Zostavax: She has not had a Zostavax but is interested in having this. I have discussed the process with her. I went ahead and sent order to her pharmacy she is to discuss the cost with her pharmacy prior to receiving this. If she is agreeable with the cost, then she will have the immunization administered there at the pharmacy. - Varicella-zoster vaccine subcutaneous At office visit 05/13/2014 asked about the Zostavax. He says that she goes to [facility] in [place] pharmacy said they did not have that vaccine so she did not follow-up about that any further.  She will have her blood pressure monitored and checked when  she goes back to the pain clinic. If blood pressure is not controlled at that visit and she can come back to our office sooner. Otherwise we will wait 3 months for follow-up office visit here.  Signed, [address] [place], [place], [facility] 10/24/2013  Signed, [address] [place], [place], [facility] 08/13/2014 4:08 PM

## 2014-08-14 ENCOUNTER — Encounter: Payer: Self-pay | Admitting: Physician Assistant

## 2014-08-18 ENCOUNTER — Encounter: Payer: Self-pay | Admitting: Physician Assistant

## 2014-08-18 LAB — HM DEXA SCAN

## 2014-08-18 LAB — HM MAMMOGRAPHY

## 2014-08-21 ENCOUNTER — Encounter: Payer: Self-pay | Admitting: Physician Assistant

## 2014-09-04 ENCOUNTER — Other Ambulatory Visit: Payer: Self-pay | Admitting: Physician Assistant

## 2014-09-04 NOTE — Telephone Encounter (Signed)
Medication refilled per protocol. 

## 2014-09-11 ENCOUNTER — Other Ambulatory Visit: Payer: Self-pay | Admitting: Physician Assistant

## 2014-09-11 NOTE — Telephone Encounter (Signed)
Medication refilled per protocol. 

## 2014-10-01 ENCOUNTER — Other Ambulatory Visit: Payer: Self-pay | Admitting: Family Medicine

## 2014-10-01 MED ORDER — ATORVASTATIN CALCIUM 20 MG PO TABS: ORAL_TABLET | ORAL | Status: DC

## 2014-10-01 NOTE — Telephone Encounter (Signed)
Medication refilled per protocol. 

## 2014-10-03 ENCOUNTER — Other Ambulatory Visit: Payer: Self-pay | Admitting: Physician Assistant

## 2014-10-03 NOTE — Telephone Encounter (Signed)
Medication refilled per protocol. 

## 2014-10-15 ENCOUNTER — Other Ambulatory Visit: Payer: Self-pay | Admitting: Physician Assistant

## 2014-10-15 NOTE — Telephone Encounter (Signed)
Medication refilled per protocol. 

## 2014-11-05 DIAGNOSIS — M47816 Spondylosis without myelopathy or radiculopathy, lumbar region: Secondary | ICD-10-CM | POA: Diagnosis not present

## 2014-11-05 DIAGNOSIS — M961 Postlaminectomy syndrome, not elsewhere classified: Secondary | ICD-10-CM | POA: Diagnosis not present

## 2014-11-05 DIAGNOSIS — M5136 Other intervertebral disc degeneration, lumbar region: Secondary | ICD-10-CM | POA: Diagnosis not present

## 2014-11-05 DIAGNOSIS — Z8781 Personal history of (healed) traumatic fracture: Secondary | ICD-10-CM | POA: Diagnosis not present

## 2014-11-13 ENCOUNTER — Ambulatory Visit: Payer: Medicare Other | Admitting: Physician Assistant

## 2014-11-15 ENCOUNTER — Other Ambulatory Visit: Payer: Self-pay | Admitting: Physician Assistant

## 2014-11-18 NOTE — Telephone Encounter (Signed)
Ok to refill??  Last office visit/ refill 08/13/2014, #2 refills.  

## 2014-11-19 NOTE — Telephone Encounter (Signed)
For the trazodone, may give #30+2 For the Xanax, may give #60+2

## 2014-11-19 NOTE — Telephone Encounter (Signed)
Medication refilled per protocol. 

## 2014-11-26 ENCOUNTER — Ambulatory Visit: Payer: Medicare Other | Admitting: Physician Assistant

## 2014-12-19 ENCOUNTER — Other Ambulatory Visit: Payer: Self-pay | Admitting: Physician Assistant

## 2014-12-19 NOTE — Telephone Encounter (Signed)
Medication refilled per protocol. 

## 2015-01-29 ENCOUNTER — Other Ambulatory Visit: Payer: Self-pay | Admitting: Physician Assistant

## 2015-01-30 ENCOUNTER — Encounter: Payer: Self-pay | Admitting: Family Medicine

## 2015-01-30 ENCOUNTER — Other Ambulatory Visit: Payer: Self-pay | Admitting: Physician Assistant

## 2015-01-30 NOTE — Telephone Encounter (Signed)
Medication refill for one time only.  Patient needs to be seen.  Letter sent for patient to call and schedule 

## 2015-01-30 NOTE — Telephone Encounter (Signed)
Medication filled x1 with no refills.   Requires office visit before any further refills can be given.   Letter sent.  

## 2015-02-18 ENCOUNTER — Other Ambulatory Visit: Payer: Self-pay | Admitting: Physician Assistant

## 2015-02-18 NOTE — Telephone Encounter (Signed)
Ok to refill??  Last office visit 08/13/2014.  Last refill 11/19/2014, #2 refills.

## 2015-02-19 NOTE — Telephone Encounter (Signed)
Approved # 60 + 2 

## 2015-02-19 NOTE — Telephone Encounter (Signed)
Medication called to pharmacy. 

## 2015-03-11 ENCOUNTER — Other Ambulatory Visit: Payer: Self-pay | Admitting: Physician Assistant

## 2015-03-12 ENCOUNTER — Encounter: Payer: Self-pay | Admitting: Family Medicine

## 2015-03-12 NOTE — Telephone Encounter (Signed)
Medication refill for one time only.  Patient needs to be seen.  Letter sent for patient to call and schedule 

## 2015-04-07 ENCOUNTER — Other Ambulatory Visit: Payer: Self-pay | Admitting: Physician Assistant

## 2015-04-07 NOTE — Telephone Encounter (Signed)
Refill appropriate and filled per protocol. 

## 2015-04-09 ENCOUNTER — Ambulatory Visit: Payer: Medicare Other | Admitting: Physician Assistant

## 2015-04-16 ENCOUNTER — Other Ambulatory Visit: Payer: Self-pay | Admitting: Physician Assistant

## 2015-04-29 DIAGNOSIS — Z79891 Long term (current) use of opiate analgesic: Secondary | ICD-10-CM | POA: Diagnosis not present

## 2015-05-02 ENCOUNTER — Other Ambulatory Visit: Payer: Self-pay | Admitting: Physician Assistant

## 2015-05-02 DIAGNOSIS — E785 Hyperlipidemia, unspecified: Secondary | ICD-10-CM

## 2015-05-04 ENCOUNTER — Encounter: Payer: Self-pay | Admitting: Family Medicine

## 2015-05-04 NOTE — Telephone Encounter (Signed)
Medication refill for one time only.  Patient needs to be seen.  Letter sent for patient to call and schedule 

## 2015-05-13 ENCOUNTER — Ambulatory Visit: Payer: Medicare Other | Admitting: Physician Assistant

## 2015-05-18 ENCOUNTER — Ambulatory Visit: Payer: Medicare Other | Admitting: Physician Assistant

## 2015-05-21 ENCOUNTER — Ambulatory Visit: Payer: Medicare Other | Admitting: Physician Assistant

## 2015-05-22 ENCOUNTER — Encounter: Payer: Self-pay | Admitting: Physician Assistant

## 2015-05-25 ENCOUNTER — Ambulatory Visit: Payer: Medicare Other | Admitting: Physician Assistant

## 2015-06-01 DIAGNOSIS — M961 Postlaminectomy syndrome, not elsewhere classified: Secondary | ICD-10-CM | POA: Diagnosis not present

## 2015-06-01 DIAGNOSIS — M47816 Spondylosis without myelopathy or radiculopathy, lumbar region: Secondary | ICD-10-CM | POA: Diagnosis not present

## 2015-06-01 DIAGNOSIS — M25511 Pain in right shoulder: Secondary | ICD-10-CM | POA: Diagnosis not present

## 2015-06-02 ENCOUNTER — Other Ambulatory Visit: Payer: Self-pay | Admitting: Physician Assistant

## 2015-06-02 ENCOUNTER — Other Ambulatory Visit: Payer: Self-pay | Admitting: Family Medicine

## 2015-06-02 NOTE — Telephone Encounter (Signed)
Refills denied, Pt must be seen per provider

## 2015-06-02 NOTE — Telephone Encounter (Signed)
Ok to refill xanax??  Last office visit 08/13/2014.  Last refill 02/19/2015, #2 refills.

## 2015-06-02 NOTE — Telephone Encounter (Signed)
refills denied per provider until she makes and keeps appointment

## 2015-06-02 NOTE — Telephone Encounter (Signed)
Agree. She recently NoShowed for appointmens .

## 2015-06-03 ENCOUNTER — Encounter: Payer: Self-pay | Admitting: Physician Assistant

## 2015-06-03 ENCOUNTER — Ambulatory Visit (INDEPENDENT_AMBULATORY_CARE_PROVIDER_SITE_OTHER): Payer: Medicare Other | Admitting: Physician Assistant

## 2015-06-03 ENCOUNTER — Emergency Department (HOSPITAL_COMMUNITY)
Admission: EM | Admit: 2015-06-03 | Discharge: 2015-06-03 | Disposition: A | Discharge status: Home / Self Care | Payer: Medicare Other | Attending: Emergency Medicine | Admitting: Emergency Medicine

## 2015-06-03 ENCOUNTER — Encounter (HOSPITAL_COMMUNITY): Payer: Self-pay | Admitting: Emergency Medicine

## 2015-06-03 VITALS — BP 180/120 | HR 88 | Temp 98.2°F | Resp 22

## 2015-06-03 DIAGNOSIS — M79606 Pain in leg, unspecified: Secondary | ICD-10-CM | POA: Diagnosis not present

## 2015-06-03 DIAGNOSIS — K529 Noninfective gastroenteritis and colitis, unspecified: Secondary | ICD-10-CM | POA: Insufficient documentation

## 2015-06-03 DIAGNOSIS — M549 Dorsalgia, unspecified: Secondary | ICD-10-CM | POA: Diagnosis not present

## 2015-06-03 DIAGNOSIS — R197 Diarrhea, unspecified: Secondary | ICD-10-CM | POA: Diagnosis present

## 2015-06-03 DIAGNOSIS — Z87891 Personal history of nicotine dependence: Secondary | ICD-10-CM | POA: Diagnosis not present

## 2015-06-03 DIAGNOSIS — I10 Essential (primary) hypertension: Secondary | ICD-10-CM | POA: Insufficient documentation

## 2015-06-03 DIAGNOSIS — E785 Hyperlipidemia, unspecified: Secondary | ICD-10-CM | POA: Insufficient documentation

## 2015-06-03 LAB — COMPREHENSIVE METABOLIC PANEL
ALK PHOS: 118 U/L (ref 38–126)
ALT: 15 U/L (ref 14–54)
AST: 26 U/L (ref 15–41)
Albumin: 4.7 g/dL (ref 3.5–5.0)
Anion gap: 11 (ref 5–15)
BUN: 14 mg/dL (ref 6–20)
CALCIUM: 9.3 mg/dL (ref 8.9–10.3)
CO2: 28 mmol/L (ref 22–32)
CREATININE: 0.96 mg/dL (ref 0.44–1.00)
Chloride: 100 mmol/L — ABNORMAL LOW (ref 101–111)
GFR, EST NON AFRICAN AMERICAN: 58 mL/min — AB (ref >60)
Glucose, Bld: 158 mg/dL — ABNORMAL HIGH (ref 65–99)
Potassium: 3.5 mmol/L (ref 3.5–5.1)
SODIUM: 139 mmol/L (ref 135–145)
Total Bilirubin: 1.1 mg/dL (ref 0.3–1.2)
Total Protein: 8.5 g/dL — ABNORMAL HIGH (ref 6.5–8.1)

## 2015-06-03 LAB — CBC
HCT: 42.5 % (ref 36.0–46.0)
Hemoglobin: 14.3 g/dL (ref 12.0–15.0)
MCH: 31.2 pg (ref 26.0–34.0)
MCHC: 33.6 g/dL (ref 30.0–36.0)
MCV: 92.8 fL (ref 78.0–100.0)
PLATELETS: 365 10*3/uL (ref 150–400)
RBC: 4.58 MIL/uL (ref 3.87–5.11)
RDW: 13.4 % (ref 11.5–15.5)
WBC: 10.5 10*3/uL (ref 4.0–10.5)

## 2015-06-03 LAB — URINE MICROSCOPIC-ADD ON: RBC / HPF: NONE SEEN RBC/hpf (ref 0–5)

## 2015-06-03 LAB — URINALYSIS, ROUTINE W REFLEX MICROSCOPIC
Glucose, UA: NEGATIVE mg/dL
HGB URINE DIPSTICK: NEGATIVE
KETONES UR: 15 mg/dL — AB
Nitrite: NEGATIVE
PROTEIN: 30 mg/dL — AB
Specific Gravity, Urine: 1.015 (ref 1.005–1.030)
pH: 6 (ref 5.0–8.0)

## 2015-06-03 LAB — LIPASE, BLOOD: Lipase: 24 U/L (ref 11–51)

## 2015-06-03 MED ORDER — SODIUM CHLORIDE 0.9 % IV BOLUS (SEPSIS): 1000.0000 mL | Freq: Once | INTRAVENOUS | Status: DC

## 2015-06-03 MED ORDER — ONDANSETRON HCL 4 MG/2ML IJ SOLN
4.0000 mg | Freq: Once | INTRAMUSCULAR | Status: AC | PRN
  Administered 2015-06-03: 4 mg via INTRAVENOUS
  Filled 2015-06-03: qty 2

## 2015-06-03 MED ORDER — ONDANSETRON HCL 4 MG PO TABS: 4.0000 mg | ORAL_TABLET | Freq: Four times a day (QID) | ORAL | Status: DC

## 2015-06-03 MED ORDER — SODIUM CHLORIDE 0.9 % IV BOLUS (SEPSIS)
1000.0000 mL | Freq: Once | INTRAVENOUS | Status: AC
Start: 2015-06-03 — End: 2015-06-03
  Administered 2015-06-03: 1000 mL via INTRAVENOUS

## 2015-06-03 MED ORDER — FENTANYL CITRATE (PF) 100 MCG/2ML IJ SOLN
50.0000 ug | Freq: Once | INTRAMUSCULAR | Status: AC
  Administered 2015-06-03: 50 ug via INTRAVENOUS
  Filled 2015-06-03: qty 2

## 2015-06-03 MED ORDER — DIPHENOXYLATE-ATROPINE 2.5-0.025 MG PO TABS: 1.0000 | ORAL_TABLET | Freq: Four times a day (QID) | ORAL | Status: DC | PRN

## 2015-06-03 MED ORDER — ONDANSETRON HCL 4 MG/2ML IJ SOLN
4.0000 mg | Freq: Once | INTRAMUSCULAR | Status: AC
  Administered 2015-06-03: 4 mg via INTRAVENOUS
  Filled 2015-06-03: qty 2

## 2015-06-03 NOTE — Discharge Instructions (Signed)
Nausea and diarrhea prescriptions. Increase fluids. Rest.

## 2015-06-03 NOTE — ED Notes (Signed)
Pt given water and saltines with Dr. Patsey Bertholdook's approval. Pt tolerating well at this time.

## 2015-06-03 NOTE — ED Notes (Signed)
Dr. Cook at bedside updating patient and family.  

## 2015-06-03 NOTE — ED Notes (Signed)
N/V/D since Monday, sent here by PCP office for dehydration.  C/o abdominal cramping, leg pain, back pain and possible sinus infection.  Rates abdominal  Pain 7/10.

## 2015-06-03 NOTE — ED Notes (Signed)
Pt states that she forgot to let EDP know that she hit her head on a filing cabinet a few days ago. Dr. Adriana Simasook notified.

## 2015-06-03 NOTE — Progress Notes (Signed)
Patient ID: Brenda Hopkins MRN: [id number], DOB: [birth year], 75 y.o. Date of Encounter: 06/03/2015, 1:06 PM    Chief Complaint:  Chief Complaint  Patient presents with  . nausea vomiting x 2 days    has not kept anything down, monday had some diarrhea     HPI: 75 y.o. year old female presents with above.   Daughter is here with her for a visit today.  Report that her symptoms started on Monday 06/01/15. Reorts that on Monday she developed diarrhea. Says that she had multiple episodes of diarrhea Monday but has had no diarrhea since Monday night. Says that Monday night the vomiting started. Since then every time she sips anything she vomits. Last night she was even having dry heaves. Says she now she is very dehydrated but did urinate a small amount this morning.     Home Meds:   Outpatient Prescriptions Prior to Visit  Medication Sig Dispense Refill  . ALPRAZolam (XANAX) 0.5 MG tablet TAKE ONE TABLET TWICE DAILY AS NEEDED 60 tablet 2  . amLODipine (NORVASC) 10 MG tablet TAKE ONE (1) TABLET EACH DAY 30 tablet 0  . atorvastatin (LIPITOR) 20 MG tablet TAKE ONE (1) TABLET AT BEDTIME 30 tablet 0  . gabapentin (NEURONTIN) 600 MG tablet TAKE ONE (1) TABLET THREE (3) TIMES EACH DAY 90 tablet 0  . Multiple Vitamin (MULTIVITAMIN) tablet Take 1 tablet by mouth daily.    Marland Kitchen oxyCODONE (ROXICODONE) 15 MG immediate release tablet Take 15 mg by mouth 4 (four) times daily.    . pantoprazole (PROTONIX) 40 MG tablet TAKE ONE (1) TABLET EACH DAY 90 tablet 0  . TIZANIDINE HCL PO Take by mouth 3 (three) times daily as needed. Pt not sure of dose    . traZODone (DESYREL) 50 MG tablet TAKE ONE TABLET BY MOUTH AT BEDTIME AS NEEDED 30 tablet 1   No facility-administered medications prior to visit.    Allergies:  Allergies  Allergen Reactions  . Prozac [Fluoxetine Hcl] Other (See Comments)    Makes her fell crazy  . Zoloft [Sertraline Hcl]     Made her feel crazy--10/2013  . Nicoderm  [Nicotine] Rash    Site rash      Review of Systems: See HPI for pertinent ROS. All other ROS negative.    Physical Exam: Blood pressure 180/120, pulse 88, temperature 98.2 F (36.8 C), temperature source Oral, resp. rate 22., There is no weight on file to calculate BMI. General:  WF. Appears ill but non-toxic. Appears in no acute distress. Neck: Supple. No thyromegaly. No lymphadenopathy. Lungs: Clear bilaterally to auscultation without wheezes, rales, or rhonchi. Breathing is unlabored. Heart: Regular rhythm. No murmurs, rubs, or gallops. Abdomen: Soft,  non-distended with normoactive bowel sounds. No hepatomegaly. No rebound/guarding. No obvious abdominal masses. Mild diffuse tenderness with palpation. No area of increased focal/loclaized pain/tenderness with palpation.  Msk:  Strength and tone normal for age. Extremities/Skin: Warm and dry. Neuro: Alert and oriented X 3. Moves all extremities spontaneously. Gait is normal. CNII-XII grossly in tact. Psych:  Responds to questions appropriately with a normal affect.     ASSESSMENT AND PLAN:  75 y.o. year old female with   1. Gastroenteritis, acute Plan was to give IV fluids and obtain labs. However our staff was unable to get a vein to administer IVFluids. Therefore patient to go directly to emergency room. Daughter is with her and is driving her directly to emergency room immediately. She left here in stable condition.  [address], Jeromy Borcherding [place], [place], [facility] 06/03/2015 1:06 PM

## 2015-06-03 NOTE — ED Provider Notes (Signed)
CSN: [id number]     Arrival date & time 06/03/15  1208 History   First MD Initiated Contact with Patient 06/03/15 1508     Chief Complaint  Patient presents with  . Diarrhea     (Consider location/radiation/quality/duration/timing/severity/associated sxs/prior Treatment) HPI..... Nausea, vomiting, diarrhea for 48 hours. Patient is able to live independently. Complains of cramping in her legs. No substernal chest pain, dyspnea, fever, sweats, chills, dysuria.  Nothing makes symptoms better or worse. Nursing note reviewed. Review systems positive for "sinus infection"  Past Medical History  Diagnosis Date  . Hypertension   . Chronic back pain   . Hyperlipidemia   . GERD (gastroesophageal reflux disease)   . Hiatal hernia   . Pelvic fracture (HCC) 12/12/2012  . Smoker    Past Surgical History  Procedure Laterality Date  . Lumbar spine surgery    . 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 Hemorroidectomy    . Appendectomy    . Abdominal hysterectomy    . Tonsillectomy    . Oophorectomy     History reviewed. No pertinent family history. Social History  Substance Use Topics  . Smoking status: Former Smoker    Types: Cigarettes  . Smokeless tobacco: Former Neurosurgeon    Quit date: 12/28/2012  . Alcohol Use: No   OB History    No data available     Review of Systems    Allergies  Prozac; Zoloft; and Nicoderm  Home Medications   Prior to Admission medications   Medication Sig Start Date End Date Taking? Authorizing Provider  Multiple Vitamin (MULTIVITAMIN) tablet Take 1 tablet by mouth daily.   Yes Historical Provider, MD  oxyCODONE (ROXICODONE) 15 MG immediate release tablet Take 15 mg by mouth 4 (four) times daily.   Yes Historical Provider, MD  pantoprazole (PROTONIX) 40 MG tablet TAKE ONE (1) TABLET EACH DAY 04/16/15  Yes Donita Brooks, MD  tiZANidine (ZANAFLEX) 4 MG tablet Take 4 mg by mouth 3 (three) times daily as needed. Muscle spasm 05/02/15  Yes Historical Provider, MD   traZODone (DESYREL) 50 MG tablet TAKE ONE TABLET BY MOUTH AT BEDTIME AS NEEDED Patient taking differently: TAKE ONE TABLET BY MOUTH AT BEDTIME AS NEEDED SLEEP 04/16/15  Yes Donita Brooks, MD  ALPRAZolam Prudy Feeler) 0.5 MG tablet TAKE ONE TABLET TWICE DAILY AS NEEDED Patient taking differently: TAKE ONE TABLET TWICE DAILY AS NEEDED ANXIETY 02/19/15   Dorena Bodo, PA-C  amLODipine (NORVASC) 10 MG tablet TAKE ONE (1) TABLET EACH DAY 06/04/15   Dorena Bodo, PA-C  atorvastatin (LIPITOR) 20 MG tablet TAKE ONE (1) TABLET AT BEDTIME 05/04/15   Dorena Bodo, PA-C  diphenoxylate-atropine (LOMOTIL) 2.5-0.025 MG tablet Take 1 tablet by mouth 4 (four) times daily as needed for diarrhea or loose stools. 06/03/15   Donnetta Hutching, MD  gabapentin (NEURONTIN) 600 MG tablet TAKE ONE (1) TABLET THREE (3) TIMES EACH DAY 06/04/15   Dorena Bodo, PA-C  ondansetron (ZOFRAN) 4 MG tablet Take 1 tablet (4 mg total) by mouth every 6 (six) hours. 06/03/15   Donnetta Hutching, MD   BP 164/98 mmHg  Pulse 81  Temp(Src) 98.2 F (36.8 C) (Oral)  Resp 18  Ht  (1.499 m)  Wt 168 lb (76.204 kg)  BMI 33.91 kg/m2  SpO2 94% Physical Exam  Constitutional: She is oriented to person, place, and time. She appears well-developed and well-nourished.  HENT:  Head: Normocephalic and atraumatic.  No evidence of head trauma  Eyes:  Conjunctivae and EOM are normal. Pupils are equal, round, and reactive to light.  Neck: Normal range of motion. Neck supple.  Cardiovascular: Normal rate and regular rhythm.   Pulmonary/Chest: Effort normal and breath sounds normal.  Abdominal: Soft. Bowel sounds are normal.  No acute abdomen  Musculoskeletal: Normal range of motion.  Neurological: She is alert and oriented to person, place, and time.  Skin: Skin is warm and dry.  Psychiatric: She has a normal mood and affect. Her behavior is normal.  Nursing note and vitals reviewed.   ED Course  Procedures (including critical care time) Labs Review Labs  Reviewed  COMPREHENSIVE METABOLIC PANEL - Abnormal; Notable for the following:    Chloride 100 (*)    Glucose, Bld 158 (*)    Total Protein 8.5 (*)    GFR calc non Af Amer 58 (*)    All other components within normal limits  URINALYSIS, ROUTINE W REFLEX MICROSCOPIC (NOT AT [facility]) - Abnormal; Notable for the following:    Bilirubin Urine SMALL (*)    Ketones, ur 15 (*)    Protein, ur 30 (*)    Leukocytes, UA MODERATE (*)    All other components within normal limits  URINE MICROSCOPIC-ADD ON - Abnormal; Notable for the following:    Squamous Epithelial / LPF 0-5 (*)    Bacteria, UA FEW (*)    Casts GRANULAR CAST (*)    All other components within normal limits  LIPASE, BLOOD  CBC    Imaging Review No results found. I have personally reviewed and evaluated these images and lab results as part of my medical decision-making.   EKG Interpretation None      MDM   Final diagnoses:  Gastroenteritis    Patient feels better after IV fluids and IV Zofran. No acute abdomen. Discharge medications Zofran 4 mg and Lomotil.    Donnetta HutchingBrian Avani Sensabaugh, MD 06/06/15 [phone]

## 2015-06-04 ENCOUNTER — Telehealth: Payer: Self-pay | Admitting: Physician Assistant

## 2015-06-04 MED ORDER — GABAPENTIN 600 MG PO TABS: ORAL_TABLET | ORAL | Status: DC

## 2015-06-04 MED ORDER — AMLODIPINE BESYLATE 10 MG PO TABS: ORAL_TABLET | ORAL | Status: DC

## 2015-06-04 NOTE — Telephone Encounter (Signed)
Agree 

## 2015-06-04 NOTE — Telephone Encounter (Signed)
Pt requestss a refill of Amlodipine 10 mg and Gabapentin 600 mg [facility] [place]

## 2015-06-04 NOTE — Telephone Encounter (Signed)
Called and spoke to patient.  She is feeling much better.  Did refill her medications for 1 month.  Called to schedule a routine office visit and she said she will have daughter call to do that as she will bring her.  Reinforced to do so before meds run out again.

## 2015-06-15 ENCOUNTER — Other Ambulatory Visit: Payer: Self-pay | Admitting: *Deleted

## 2015-06-15 DIAGNOSIS — E785 Hyperlipidemia, unspecified: Secondary | ICD-10-CM

## 2015-06-15 NOTE — Telephone Encounter (Signed)
Spoke to patient, was suppose to make follow up visit after seen Ed few weeks ago.  Told her need to see her to refill meds and check up on how she is doing.  Said she would do so in the AM.  OK for me to sent 1 month of refills

## 2015-06-15 NOTE — Telephone Encounter (Signed)
Pt called needs refill on Trazadone, Protonix, Atorvastatin and [facility]

## 2015-06-16 MED ORDER — ATORVASTATIN CALCIUM 20 MG PO TABS: ORAL_TABLET | ORAL | Status: DC

## 2015-06-16 MED ORDER — PANTOPRAZOLE SODIUM 40 MG PO TBEC: DELAYED_RELEASE_TABLET | ORAL | Status: DC

## 2015-06-16 MED ORDER — ALPRAZOLAM 0.5 MG PO TABS: ORAL_TABLET | ORAL | Status: DC

## 2015-06-16 MED ORDER — TRAZODONE HCL 50 MG PO TABS: 50.0000 mg | ORAL_TABLET | Freq: Every evening | ORAL | Status: DC | PRN

## 2015-06-16 NOTE — Telephone Encounter (Signed)
Have pt schedule OV. Can send ONE month supply of meds to use UNTIL OV.

## 2015-06-16 NOTE — Telephone Encounter (Signed)
Meds refilled x 1 each.  I do see appt for 06/22/15.

## 2015-06-22 ENCOUNTER — Ambulatory Visit: Payer: Medicare Other | Admitting: Physician Assistant

## 2015-06-24 ENCOUNTER — Encounter: Payer: Self-pay | Admitting: Physician Assistant

## 2015-06-24 ENCOUNTER — Ambulatory Visit: Payer: Medicare Other | Admitting: Physician Assistant

## 2015-06-24 ENCOUNTER — Ambulatory Visit (INDEPENDENT_AMBULATORY_CARE_PROVIDER_SITE_OTHER): Payer: Commercial Managed Care - HMO | Admitting: Physician Assistant

## 2015-06-24 VITALS — BP 114/82 | HR 98 | Temp 98.3°F | Resp 18 | Wt 169.0 lb

## 2015-06-24 DIAGNOSIS — K449 Diaphragmatic hernia without obstruction or gangrene: Secondary | ICD-10-CM | POA: Diagnosis not present

## 2015-06-24 DIAGNOSIS — K219 Gastro-esophageal reflux disease without esophagitis: Secondary | ICD-10-CM | POA: Diagnosis not present

## 2015-06-24 DIAGNOSIS — Z720 Tobacco use: Secondary | ICD-10-CM | POA: Diagnosis not present

## 2015-06-24 DIAGNOSIS — E785 Hyperlipidemia, unspecified: Secondary | ICD-10-CM

## 2015-06-24 DIAGNOSIS — F17200 Nicotine dependence, unspecified, uncomplicated: Secondary | ICD-10-CM

## 2015-06-24 DIAGNOSIS — M549 Dorsalgia, unspecified: Secondary | ICD-10-CM | POA: Diagnosis not present

## 2015-06-24 DIAGNOSIS — L239 Allergic contact dermatitis, unspecified cause: Secondary | ICD-10-CM

## 2015-06-24 DIAGNOSIS — L200 Besnier's prurigo: Secondary | ICD-10-CM | POA: Diagnosis not present

## 2015-06-24 DIAGNOSIS — I10 Essential (primary) hypertension: Secondary | ICD-10-CM

## 2015-06-24 DIAGNOSIS — F411 Generalized anxiety disorder: Secondary | ICD-10-CM | POA: Diagnosis not present

## 2015-06-24 DIAGNOSIS — G8929 Other chronic pain: Secondary | ICD-10-CM | POA: Diagnosis not present

## 2015-06-24 DIAGNOSIS — G4700 Insomnia, unspecified: Secondary | ICD-10-CM

## 2015-06-24 MED ORDER — ATORVASTATIN CALCIUM 20 MG PO TABS: ORAL_TABLET | ORAL | Status: DC

## 2015-06-24 MED ORDER — METHYLPREDNISOLONE ACETATE 40 MG/ML IJ SUSP
60.0000 mg | Freq: Once | INTRAMUSCULAR | Status: AC
  Administered 2015-06-24: 60 mg via INTRAMUSCULAR

## 2015-06-24 MED ORDER — PANTOPRAZOLE SODIUM 40 MG PO TBEC: DELAYED_RELEASE_TABLET | ORAL | Status: DC

## 2015-06-24 MED ORDER — TRAZODONE HCL 50 MG PO TABS: 50.0000 mg | ORAL_TABLET | Freq: Every evening | ORAL | Status: DC | PRN

## 2015-06-24 MED ORDER — AMLODIPINE BESYLATE 10 MG PO TABS: ORAL_TABLET | ORAL | Status: DC

## 2015-06-24 NOTE — Progress Notes (Signed)
Patient ID: Brenda Hopkins MRN: [id number], DOB: 21-Feb-1941, 75 y.o. Date of Encounter: @DATE @  Chief Complaint:  Chief Complaint  Patient presents with  . Follow-up    ED follow up for gastritis, poison ivy/oak    HPI: 75 y.o. year old white female  presents for f/u OV.  Her first OV was with me on 01/28/13.  At that visit she was here with her daughter.  She had been living in [place] until she was recently hospitalized in [place] secondary to a pelvic fracture. At the time of her initial visit with me we do not have any records and all information was according to the patient and her daughter.  Since that fall and pelvic fracture she has moved here to where she can live with her daughter.  They reported that she was already on pain medications secondary to back pain prior to the most recent hospitalization.  She reported that she had had 2 back surgeries in 2009 after a fall. She reported that she had been on hydrocodone and then later on oxycodone for her back. Also was already on trazodone " to help her relax and sleep".  At her visit with me on 01/28/13 we discussed our clinic policy regarding chronic pain medications. I told her that we typically do not prescribe any pain medications until we have records to review. She reported she absolutely had to have some pain medications. Gave her #30 of Percocet to hold her over until she can have followup once we obtained records.  Did obtain records which did indicate that she had been on chronic pain medications even prior to the recent pelvic fracture.  She was then informed to keep her followup office visit here,  but that she would also have to follow up with the pain clinic as we would not prescribe long-term pain meds.  Over the following several months, she had a multitude of phone calls with our office regarding changing medications.  At her OV 07/2013--Her daughter was with her again for that visit. Daughter did most of  the talking. Both the daughter and the patient said that the patient is anxious all the time and worries about everything. Also she needed medicine for anxiety to help during the daytime too-not just night tome dose of klonopin.   They both reported that the patient is not sleeping well. Said that trazodone isn't working very well--not sleeping well even with this med.   Patient said that she thought the main reason she couldn't sleep was because of muscle spasms.  She thought if she had some medicine for muscle spasms to use for this during the day as well as at night that this would help with the spasms as well as help her sleep. Daughter said that soma works really well for her and both pt and daughter were requesting soma.  Also, at that visit-- the daughter said that she uses Xanax herself that this works much better and thinks that we should use this instead of Klonopin.  Also, the patient shows me a note that she received from her insurance regarding scheduling a complete physical exam. They will  give her $15 gift card for doing the physical.  At that office visit 07/2013:  I stopped the clonazepam and the trazodone.  I prescribed Xanax 0.5 one daily when necessary #30+0 I prescribed Soma I prescribed Zoloft 50 mg 1 daily.  As well she was to schedule complete physical exam and come fasting to that  appointment.  She did return fasting for complete physical exam 10/24/13.  At OV-73/03/2014:   She makes mention that  "the Prozac made her feel disoriented and 'can't take those kinds of medicines' ." I then reviewed phone messages. 12/10/13 phone message reported that she wanted to increase Xanax to 3 times a day. At that time I had her increase Zoloft to 100 mg. Continued the Xanax at one time daily. 02/20/14 phone message states that she stopped Zoloft because it caused her to have a "stupid feeling."  Again was requesting that I prescribe Xanax 3 times a day. At that time I prescribed  Prozac and again continued the Xanax at daily.  At OV 05/13/14-- patient said that the Prozac caused "the same thing happened when I took the Prozac----I can't take those medicines".  Said that she was getting no sleep. I then reviewed the last office visit note with her and the fact that at that visit she said that the main reason she wasn't sleeping was because the muscle spasms and she was requesting Soma and I did prescribe Soma. She said that Dr. Laurian Brim at the Pain Clinic stopped the [facility] and changed it to tizinidine.  Said that she was not sleeping at all. Said that she had never tried any "sleep medicine."  York Spaniel had never been on Ambien in the past.  Also said that when she was at Dr. Jon Gills last week at the pain clinic blood pressure was high at 178/148.  AT THAT OV 05/13/2014: --Continued Xanax #30 +2 and informed that must last full month --Rxed Ambien  Increased Norvasc from  to .   08/13/2014: A says states that she did increase the Norvasc to 10 mg. Says that when she has been to the pain clinic her blood pressure has been 120/80 and 140/80. Visit her blood pressure has come down and is meant much better. She is having no adverse effects with the 10 mg Norvasc. No lower extremity edema. Several times throughout the visit she keeps repeating "if I just had something for my nerves ". Says that she feels like she needs another Xanax during the day. Also says that the medicine for sleep is helping but still has some trouble sleeping. I reviewed that because of insurance coverage issues etc., Ambien was changed to trazodone 50 mg daily at bedtime on 05/20/14. See that phone message for documentation. No other complaints or concerns today.  06/24/2015: Daughter is with her for appointment today. They report that patient was out in the yard pulling some weeds recently. Since then, she has had itchy rash on her lower legs and also has developed some splotches on her neck and upper  body. Says it is very itchy and says that she is not getting any sleep because of the itching. She states that she is feels no effect when she takes current dose of Xanax. Says that she feels that we need to increase the dose. She is using trazodone for sleep. She is still taking the Norvasc for blood pressure control. No other complaints or concerns today. She is not fasting today.  Past Medical History  Diagnosis Date  . Hypertension   . Chronic back pain   . Hyperlipidemia   . GERD (gastroesophageal reflux disease)   . Hiatal hernia   . Pelvic fracture (HCC) 12/12/2012  . Smoker      Home Meds: Outpatient Prescriptions Prior to Visit  Medication Sig Dispense Refill  . ALPRAZolam (XANAX) 0.5 MG tablet  TAKE ONE TABLET TWICE DAILY AS NEEDED 60 tablet 0  . amLODipine (NORVASC) 10 MG tablet TAKE ONE (1) TABLET EACH DAY 30 tablet 0  . atorvastatin (LIPITOR) 20 MG tablet TAKE ONE (1) TABLET AT BEDTIME 30 tablet 0  . diphenoxylate-atropine (LOMOTIL) 2.5-0.025 MG tablet Take 1 tablet by mouth 4 (four) times daily as needed for diarrhea or loose stools. 15 tablet 0  . gabapentin (NEURONTIN) 600 MG tablet TAKE ONE (1) TABLET THREE (3) TIMES EACH DAY 90 tablet 0  . Multiple Vitamin (MULTIVITAMIN) tablet Take 1 tablet by mouth daily.    . ondansetron (ZOFRAN) 4 MG tablet Take 1 tablet (4 mg total) by mouth every 6 (six) hours. 12 tablet 0  . oxyCODONE (ROXICODONE) 15 MG immediate release tablet Take 15 mg by mouth 4 (four) times daily.    . pantoprazole (PROTONIX) 40 MG tablet TAKE ONE (1) TABLET EACH DAY 30 tablet 0  . tiZANidine (ZANAFLEX) 4 MG tablet Take 4 mg by mouth 3 (three) times daily as needed. Muscle spasm    . traZODone (DESYREL) 50 MG tablet Take 1 tablet (50 mg total) by mouth at bedtime as needed. 30 tablet 0   No facility-administered medications prior to visit.     Allergies:  Allergies  Allergen Reactions  . Prozac [Fluoxetine Hcl] Other (See Comments)    Makes her  fell crazy  . Zoloft [Sertraline Hcl]     Made her feel crazy--10/2013  . Nicoderm [Nicotine] Rash    Site rash    Social History   Social History  . Marital Status: Divorced    Spouse Name: N/A  . Number of Children: N/A  . Years of Education: N/A   Occupational History  . Not on file.   Social History Main Topics  . Smoking status: Former Smoker    Types: Cigarettes  . Smokeless tobacco: Former Neurosurgeon    Quit date: 12/28/2012  . Alcohol Use: No  . Drug Use: No  . Sexual Activity: Not on file   Other Topics Concern  . Not on file   Social History Narrative    No family history on file.   Review of Systems:  See HPI for pertinent ROS. All other ROS negative.    Physical Exam: Blood pressure 114/82, pulse 98, temperature 98.3 F (36.8 C), temperature source Oral, resp. rate 18, weight 169 lb (76.658 kg)., Body mass index is 34.12 kg/(m^2). General: Overweight white female.  Appears in no acute distress. Neck: Supple. No thyromegaly. No lymphadenopathy. No carotid bruits. Lungs: Clear bilaterally to auscultation without wheezes, rales, or rhonchi. Breathing is unlabored. Heart: RRR with S1 S2. No murmurs, rubs, or gallops. Abdomen: Soft, non-tender, non-distended with normoactive bowel sounds. No hepatomegaly. No rebound/guarding. No obvious abdominal masses. Musculoskeletal:  Strength and tone normal for age. Extremities/Skin: Warm and dry.  No edema. Left calf with erythematous papules in a linear distribution. Right calf with erythematous papules in scattered distribution. Chest, upper arms with scattered erythematous papules. Neuro: Alert and oriented X 3. Moves all extremities spontaneously. Gait is normal. CNII-XII grossly in tact. Psych:  Responds to questions appropriately with a normal affect. Today she seems calm and with appropriate affect, mood, behavior.     ASSESSMENT AND PLAN:  75 y.o. year old female with   Allergic dermatitis Discussed oral  prednisone taper but she prefers an injection. Says that she has been getting no sleep secondary to scratching and itching. Therefore Depo-Medrol 60 mg IM given today. Follow  up if itching and rash do not resolve with this treatment.   Generalized anxiety disorder Intolerant to Zoloft and Prozac. In past---08/2014-- I increased her Xanax to where she can use 1 twice daily if needed. Continued the dose to 0.5 mg.  At OV 06/24/15 she is reporting that she does not feel much effect from the Xanax anymore. I discussed with her tolerance and dependence. Told her that I'm not going to increase the dose. Will continue current dose. At OV 06/24/2015--I called in Rx for Xanax 0.5mg  1 po BID PRN # 60 + 2.   Essential hypertension Blood pressure at goal/controlled.  She had recent CMET 06/03/2015  Hyperlipidemia She had lipid panel 10/2013 which was at goal. LDL was at 65. She is not fasting today. Return fasting for lipid panel. LFTs were normal on CMET 06/03/2015  Smoker Aware of need for cessation  Gastroesophageal reflux disease, esophagitis presence not specified Stable, controlled with current medication.  Chronic back pain Managed by Pain Clinic  Insomnia -At visit 3/16 are prescribed Ambien. See phone message 05/20/14--- changed to trazodone 50 mg daily at bedtime. Continue this.   SHE HAD CPE 10/2013. THE FOLLOWING IS COPIED FROM THAT NOTE:   Visit for preventive health examination  A. Screening Labs: She is fasting. Will check screening labs. - CBC with Differential - COMPLETE METABOLIC PANEL WITH GFR - Lipid panel - TSH  B. Pap: She has history of hysterectomy. This was not performed for cancer. Therefore, further Pap smear not indicated. Today I recommended doing a pelvic exam but she deferred.  C. Screening Mammogram: She says that her last mammogram was in the 1990s. She refuses followup mammogram. I discussed risks versus benefits of the procedure and discussed that this  could detect a small cancer. She still refuses.  D. DEXA/BMD:  She reports that she has never had a bone density test. I discussed reasons to do a bone density test. She still refuses to have bone density test.  E. Colorectal Cancer Screening: She states that her last colonoscopy was approximately 6 years ago. This was [place] in [place] to [place] moving here with her daughter. Patient says that this was normal.  F. Immunizations:  Influenza: N/A-----05/13/2014--- he shouldn't request to get the influenza vaccine today--- influenza vaccine given 05/13/14 Tetanus: I do not have her record of her last tetanus. However I will not give this today as it usually is not covered by Medicare. Pneumococcal: She has had no pneumonia vaccine. Will give Prevnar 13 today. 6-12 months later she will get Pneumovax 23. -----Prevnar 13 given 10/2013 -----Pneumovax 23 given 05/13/2014 Zostavax: She has not had a Zostavax but is interested in having this. I have discussed the process with her. I went ahead and sent order to her pharmacy she is to discuss the cost with her pharmacy prior to receiving this. If she is agreeable with the cost, then she will have the immunization administered there at the pharmacy. - Varicella-zoster vaccine subcutaneous At office visit 05/13/2014 asked about the Zostavax. He says that she goes [facility] [place] pharmacy said they did not have that vaccine so she did not follow-up about that any further.   Signed, [address]Mary Beth Mole [place], [place], BSFM 10/24/2013  Signed, [address]Mary Beth [place], [place], [facility] 06/24/2015 12:44 PM

## 2015-06-30 ENCOUNTER — Encounter: Payer: Self-pay | Admitting: Physician Assistant

## 2015-07-12 ENCOUNTER — Other Ambulatory Visit: Payer: Self-pay | Admitting: Physician Assistant

## 2015-07-13 NOTE — Telephone Encounter (Signed)
Medication refilled per protocol. 

## 2015-07-23 ENCOUNTER — Other Ambulatory Visit: Payer: Self-pay | Admitting: Physician Assistant

## 2015-07-23 NOTE — Telephone Encounter (Signed)
Refills Approved. Xanax # 60 + 2.  Trazadone # 30 + 2

## 2015-07-23 NOTE — Telephone Encounter (Signed)
Ok to refill??  Last office visit 06/24/2015.  Last refill 06/16/2015.

## 2015-07-23 NOTE — Telephone Encounter (Signed)
Medication called to pharmacy. 

## 2015-09-10 ENCOUNTER — Telehealth: Payer: Self-pay | Admitting: Physician Assistant

## 2015-09-10 DIAGNOSIS — S329XXS Fracture of unspecified parts of lumbosacral spine and pelvis, sequela: Secondary | ICD-10-CM

## 2015-09-10 DIAGNOSIS — M549 Dorsalgia, unspecified: Principal | ICD-10-CM

## 2015-09-10 DIAGNOSIS — G8929 Other chronic pain: Secondary | ICD-10-CM

## 2015-09-10 NOTE — Telephone Encounter (Signed)
Pt's current pain management doctor is moving and she is requesting a referral to see Dr. [place]rel [place]

## 2015-10-19 ENCOUNTER — Other Ambulatory Visit: Payer: Self-pay | Admitting: Family Medicine

## 2015-10-19 NOTE — Telephone Encounter (Signed)
Calling because has not heard form Dr Gerilyn Pilgrimoonquah office about his taking over her pain management.  Afraid will run out of her Oxycodone and go into withdrawal.  Was given a month supply from Dr Laurian Brim'Toole on 09/29/16.  Called pt and told her to let us know 3 days prior to needing next refill if has not heard form Dr Gerilyn Pilgrimoonquah.

## 2015-10-21 NOTE — Telephone Encounter (Signed)
Agree 

## 2015-11-03 ENCOUNTER — Other Ambulatory Visit: Payer: Self-pay | Admitting: Physician Assistant

## 2015-11-03 ENCOUNTER — Telehealth: Payer: Self-pay | Admitting: Physician Assistant

## 2015-11-03 NOTE — Telephone Encounter (Signed)
Med has been refilled.

## 2015-11-03 NOTE — Telephone Encounter (Signed)
Ok to refill 

## 2015-11-03 NOTE — Telephone Encounter (Signed)
Pt is none stop calling office.  Refills were going to old pain med provider who is no longer there.  Has been out of the Tizanidine for some time.  1 refill was sent.  Still trying to get new pain management provider on board.

## 2015-11-03 NOTE — Telephone Encounter (Signed)
Patient has called several times regarding her tizanidine she says she needs its asap and is desperate for this med  [phone]

## 2015-11-04 NOTE — Telephone Encounter (Signed)
Approved. Agree for 1 refill and f/u with new pain management.

## 2015-11-29 ENCOUNTER — Other Ambulatory Visit: Payer: Self-pay | Admitting: Physician Assistant

## 2015-12-01 ENCOUNTER — Other Ambulatory Visit: Payer: Self-pay

## 2015-12-01 NOTE — Telephone Encounter (Signed)
Last OV 06-24-15 Last refill 07-23-15 Okay to refill?

## 2015-12-02 ENCOUNTER — Other Ambulatory Visit: Payer: Self-pay | Admitting: Physician Assistant

## 2015-12-02 NOTE — Telephone Encounter (Signed)
Approved. Trazodone No. 30 +2. Alprazolam No. 60 +2. Tizanadine   #90+2.

## 2015-12-02 NOTE — Telephone Encounter (Signed)
Rx called in to pharmacy. 

## 2015-12-02 NOTE — Telephone Encounter (Signed)
Approved. # 60 + 0. 

## 2015-12-02 NOTE — Telephone Encounter (Signed)
Ok to refill??  Last office visit 06/24/2015.  Last refill Xanax 9/20- will be denied.   Las refill on Zanaflex 11/03/2015.

## 2015-12-03 NOTE — Telephone Encounter (Signed)
RX filled  Alprazolam called in

## 2015-12-15 DIAGNOSIS — M545 Low back pain: Secondary | ICD-10-CM | POA: Diagnosis not present

## 2015-12-15 DIAGNOSIS — F419 Anxiety disorder, unspecified: Secondary | ICD-10-CM | POA: Diagnosis not present

## 2015-12-15 DIAGNOSIS — G8929 Other chronic pain: Secondary | ICD-10-CM | POA: Diagnosis not present

## 2015-12-15 DIAGNOSIS — M25511 Pain in right shoulder: Secondary | ICD-10-CM | POA: Diagnosis not present

## 2015-12-15 DIAGNOSIS — G2581 Restless legs syndrome: Secondary | ICD-10-CM | POA: Diagnosis not present

## 2015-12-24 ENCOUNTER — Other Ambulatory Visit: Payer: Self-pay | Admitting: Physician Assistant

## 2015-12-25 NOTE — Telephone Encounter (Signed)
Rx refilled per protocol   Pt need OV letter out

## 2016-01-08 DIAGNOSIS — G2581 Restless legs syndrome: Secondary | ICD-10-CM | POA: Diagnosis not present

## 2016-01-08 DIAGNOSIS — M2550 Pain in unspecified joint: Secondary | ICD-10-CM | POA: Diagnosis not present

## 2016-01-08 DIAGNOSIS — M25511 Pain in right shoulder: Secondary | ICD-10-CM | POA: Diagnosis not present

## 2016-01-08 DIAGNOSIS — F419 Anxiety disorder, unspecified: Secondary | ICD-10-CM | POA: Diagnosis not present

## 2016-01-15 DIAGNOSIS — M79606 Pain in leg, unspecified: Secondary | ICD-10-CM | POA: Diagnosis not present

## 2016-01-15 DIAGNOSIS — E039 Hypothyroidism, unspecified: Secondary | ICD-10-CM | POA: Diagnosis not present

## 2016-01-15 DIAGNOSIS — G2581 Restless legs syndrome: Secondary | ICD-10-CM | POA: Diagnosis not present

## 2016-01-15 DIAGNOSIS — G8929 Other chronic pain: Secondary | ICD-10-CM | POA: Diagnosis not present

## 2016-01-15 DIAGNOSIS — M545 Low back pain: Secondary | ICD-10-CM | POA: Diagnosis not present

## 2016-02-12 DIAGNOSIS — M549 Dorsalgia, unspecified: Secondary | ICD-10-CM | POA: Diagnosis not present

## 2016-02-12 DIAGNOSIS — F419 Anxiety disorder, unspecified: Secondary | ICD-10-CM | POA: Diagnosis not present

## 2016-02-12 DIAGNOSIS — L570 Actinic keratosis: Secondary | ICD-10-CM | POA: Diagnosis not present

## 2016-02-12 DIAGNOSIS — G8929 Other chronic pain: Secondary | ICD-10-CM | POA: Diagnosis not present

## 2016-03-02 ENCOUNTER — Other Ambulatory Visit: Payer: Self-pay | Admitting: Physician Assistant

## 2016-03-02 MED ORDER — ALPRAZOLAM 0.5 MG PO TABS: 0.5000 mg | ORAL_TABLET | Freq: Two times a day (BID) | ORAL | 2 refills | Status: DC | PRN

## 2016-03-02 NOTE — Telephone Encounter (Signed)
Rx called in 

## 2016-03-02 NOTE — Telephone Encounter (Signed)
Approved # 60 + 2 

## 2016-03-02 NOTE — Telephone Encounter (Signed)
Last OV 4-12 Last refill 9-21 Okay to refill?

## 2016-03-04 DIAGNOSIS — I10 Essential (primary) hypertension: Secondary | ICD-10-CM | POA: Diagnosis not present

## 2016-03-11 DIAGNOSIS — Z9181 History of falling: Secondary | ICD-10-CM | POA: Diagnosis not present

## 2016-03-11 DIAGNOSIS — M549 Dorsalgia, unspecified: Secondary | ICD-10-CM | POA: Diagnosis not present

## 2016-03-11 DIAGNOSIS — I10 Essential (primary) hypertension: Secondary | ICD-10-CM | POA: Diagnosis not present

## 2016-03-11 DIAGNOSIS — F419 Anxiety disorder, unspecified: Secondary | ICD-10-CM | POA: Diagnosis not present

## 2016-03-15 DIAGNOSIS — G8929 Other chronic pain: Secondary | ICD-10-CM | POA: Diagnosis not present

## 2016-04-04 ENCOUNTER — Other Ambulatory Visit: Payer: Self-pay | Admitting: Physician Assistant

## 2016-04-05 NOTE — Telephone Encounter (Signed)
Rx filled per protocol.Pt need Ov/ letter mailed 

## 2016-04-06 DIAGNOSIS — G8929 Other chronic pain: Secondary | ICD-10-CM | POA: Diagnosis not present

## 2016-04-08 DIAGNOSIS — M79606 Pain in leg, unspecified: Secondary | ICD-10-CM | POA: Diagnosis not present

## 2016-05-01 ENCOUNTER — Other Ambulatory Visit: Payer: Self-pay | Admitting: Physician Assistant

## 2016-05-02 NOTE — Telephone Encounter (Signed)
Rx filled per protocol  

## 2016-05-06 DIAGNOSIS — F1120 Opioid dependence, uncomplicated: Secondary | ICD-10-CM | POA: Diagnosis not present

## 2016-05-06 DIAGNOSIS — G2581 Restless legs syndrome: Secondary | ICD-10-CM | POA: Diagnosis not present

## 2016-05-06 DIAGNOSIS — M545 Low back pain: Secondary | ICD-10-CM | POA: Diagnosis not present

## 2016-05-06 DIAGNOSIS — F419 Anxiety disorder, unspecified: Secondary | ICD-10-CM | POA: Diagnosis not present

## 2016-05-07 ENCOUNTER — Other Ambulatory Visit: Payer: Self-pay | Admitting: Physician Assistant

## 2016-05-09 NOTE — Telephone Encounter (Signed)
Rx filled per protocol  

## 2016-05-14 DIAGNOSIS — F419 Anxiety disorder, unspecified: Secondary | ICD-10-CM | POA: Diagnosis not present

## 2016-05-14 DIAGNOSIS — I10 Essential (primary) hypertension: Secondary | ICD-10-CM | POA: Diagnosis not present

## 2016-05-14 DIAGNOSIS — M545 Low back pain: Secondary | ICD-10-CM | POA: Diagnosis not present

## 2016-05-17 DIAGNOSIS — M545 Low back pain: Secondary | ICD-10-CM | POA: Diagnosis not present

## 2016-05-17 DIAGNOSIS — Z79899 Other long term (current) drug therapy: Secondary | ICD-10-CM | POA: Diagnosis not present

## 2016-05-26 DIAGNOSIS — I10 Essential (primary) hypertension: Secondary | ICD-10-CM | POA: Diagnosis not present

## 2016-05-26 DIAGNOSIS — M549 Dorsalgia, unspecified: Secondary | ICD-10-CM | POA: Diagnosis not present

## 2016-05-27 DIAGNOSIS — Z79899 Other long term (current) drug therapy: Secondary | ICD-10-CM | POA: Diagnosis not present

## 2016-05-30 ENCOUNTER — Other Ambulatory Visit: Payer: Self-pay | Admitting: Physician Assistant

## 2016-05-30 NOTE — Telephone Encounter (Signed)
Refill appropriate 

## 2016-06-05 ENCOUNTER — Other Ambulatory Visit: Payer: Self-pay | Admitting: Physician Assistant

## 2016-06-06 NOTE — Telephone Encounter (Signed)
Refill appropriate. Patient need OV letter mailed

## 2016-06-11 DIAGNOSIS — M545 Low back pain: Secondary | ICD-10-CM | POA: Diagnosis not present

## 2016-06-11 DIAGNOSIS — F419 Anxiety disorder, unspecified: Secondary | ICD-10-CM | POA: Diagnosis not present

## 2016-06-11 DIAGNOSIS — G2581 Restless legs syndrome: Secondary | ICD-10-CM | POA: Diagnosis not present

## 2016-06-30 ENCOUNTER — Other Ambulatory Visit: Payer: Self-pay | Admitting: Physician Assistant

## 2016-06-30 NOTE — Telephone Encounter (Signed)
Refill appropriate 

## 2016-07-06 DIAGNOSIS — I10 Essential (primary) hypertension: Secondary | ICD-10-CM | POA: Diagnosis not present

## 2016-07-06 DIAGNOSIS — G2581 Restless legs syndrome: Secondary | ICD-10-CM | POA: Diagnosis not present

## 2016-07-06 DIAGNOSIS — F419 Anxiety disorder, unspecified: Secondary | ICD-10-CM | POA: Diagnosis not present

## 2016-07-06 DIAGNOSIS — M545 Low back pain: Secondary | ICD-10-CM | POA: Diagnosis not present

## 2016-07-09 ENCOUNTER — Other Ambulatory Visit: Payer: Self-pay | Admitting: Physician Assistant

## 2016-07-10 ENCOUNTER — Other Ambulatory Visit: Payer: Self-pay | Admitting: Physician Assistant

## 2016-07-11 NOTE — Telephone Encounter (Signed)
Refill appropriate 

## 2016-08-03 DIAGNOSIS — M79606 Pain in leg, unspecified: Secondary | ICD-10-CM | POA: Diagnosis not present

## 2016-08-03 DIAGNOSIS — K219 Gastro-esophageal reflux disease without esophagitis: Secondary | ICD-10-CM | POA: Diagnosis not present

## 2016-08-03 DIAGNOSIS — M25511 Pain in right shoulder: Secondary | ICD-10-CM | POA: Diagnosis not present

## 2016-08-03 DIAGNOSIS — Z79899 Other long term (current) drug therapy: Secondary | ICD-10-CM | POA: Diagnosis not present

## 2016-08-03 DIAGNOSIS — F1120 Opioid dependence, uncomplicated: Secondary | ICD-10-CM | POA: Diagnosis not present

## 2016-08-03 DIAGNOSIS — G2581 Restless legs syndrome: Secondary | ICD-10-CM | POA: Diagnosis not present

## 2016-08-03 DIAGNOSIS — M545 Low back pain: Secondary | ICD-10-CM | POA: Diagnosis not present

## 2016-08-03 DIAGNOSIS — I10 Essential (primary) hypertension: Secondary | ICD-10-CM | POA: Diagnosis not present

## 2016-08-03 DIAGNOSIS — Z5181 Encounter for therapeutic drug level monitoring: Secondary | ICD-10-CM | POA: Diagnosis not present

## 2016-08-03 DIAGNOSIS — R252 Cramp and spasm: Secondary | ICD-10-CM | POA: Diagnosis not present

## 2016-08-22 ENCOUNTER — Other Ambulatory Visit: Payer: Self-pay | Admitting: Physician Assistant

## 2016-08-23 NOTE — Telephone Encounter (Signed)
Rx filled per protocol. Pt due for an office visit. Letter mailed

## 2016-08-24 DIAGNOSIS — M25511 Pain in right shoulder: Secondary | ICD-10-CM | POA: Diagnosis not present

## 2016-08-24 DIAGNOSIS — F419 Anxiety disorder, unspecified: Secondary | ICD-10-CM | POA: Diagnosis not present

## 2016-08-24 DIAGNOSIS — M79606 Pain in leg, unspecified: Secondary | ICD-10-CM | POA: Diagnosis not present

## 2016-08-24 DIAGNOSIS — G4709 Other insomnia: Secondary | ICD-10-CM | POA: Diagnosis not present

## 2016-08-24 DIAGNOSIS — E785 Hyperlipidemia, unspecified: Secondary | ICD-10-CM | POA: Diagnosis not present

## 2016-08-24 DIAGNOSIS — M545 Low back pain: Secondary | ICD-10-CM | POA: Diagnosis not present

## 2016-08-24 DIAGNOSIS — I10 Essential (primary) hypertension: Secondary | ICD-10-CM | POA: Diagnosis not present

## 2016-08-24 DIAGNOSIS — G2581 Restless legs syndrome: Secondary | ICD-10-CM | POA: Diagnosis not present

## 2016-08-26 ENCOUNTER — Other Ambulatory Visit: Payer: Self-pay | Admitting: Physician Assistant

## 2016-08-26 NOTE — Telephone Encounter (Signed)
Ok to refill 

## 2016-08-29 NOTE — Telephone Encounter (Signed)
Not able to contact pt by phone letter will be mailed

## 2016-08-29 NOTE — Telephone Encounter (Signed)
Needs OV. She is on multiple other medications that need to be monitored as well and last office visit greater than one year ago.

## 2016-09-27 ENCOUNTER — Other Ambulatory Visit: Payer: Self-pay | Admitting: Physician Assistant

## 2016-09-28 NOTE — Telephone Encounter (Signed)
patient due for office visit letter mailed

## 2016-10-06 DIAGNOSIS — G2581 Restless legs syndrome: Secondary | ICD-10-CM | POA: Diagnosis not present

## 2016-10-06 DIAGNOSIS — I10 Essential (primary) hypertension: Secondary | ICD-10-CM | POA: Diagnosis not present

## 2016-10-06 DIAGNOSIS — F1120 Opioid dependence, uncomplicated: Secondary | ICD-10-CM | POA: Diagnosis not present

## 2016-10-06 DIAGNOSIS — G4709 Other insomnia: Secondary | ICD-10-CM | POA: Diagnosis not present

## 2016-10-06 DIAGNOSIS — E039 Hypothyroidism, unspecified: Secondary | ICD-10-CM | POA: Diagnosis not present

## 2016-10-06 DIAGNOSIS — R52 Pain, unspecified: Secondary | ICD-10-CM | POA: Diagnosis not present

## 2016-10-06 DIAGNOSIS — Z5181 Encounter for therapeutic drug level monitoring: Secondary | ICD-10-CM | POA: Diagnosis not present

## 2016-10-06 DIAGNOSIS — Z79899 Other long term (current) drug therapy: Secondary | ICD-10-CM | POA: Diagnosis not present

## 2016-11-03 DIAGNOSIS — E039 Hypothyroidism, unspecified: Secondary | ICD-10-CM | POA: Diagnosis not present

## 2016-11-03 DIAGNOSIS — Z5181 Encounter for therapeutic drug level monitoring: Secondary | ICD-10-CM | POA: Diagnosis not present

## 2016-11-03 DIAGNOSIS — F1120 Opioid dependence, uncomplicated: Secondary | ICD-10-CM | POA: Diagnosis not present

## 2016-11-03 DIAGNOSIS — G2581 Restless legs syndrome: Secondary | ICD-10-CM | POA: Diagnosis not present

## 2016-11-03 DIAGNOSIS — M25511 Pain in right shoulder: Secondary | ICD-10-CM | POA: Diagnosis not present

## 2016-11-03 DIAGNOSIS — E785 Hyperlipidemia, unspecified: Secondary | ICD-10-CM | POA: Diagnosis not present

## 2016-11-03 DIAGNOSIS — I10 Essential (primary) hypertension: Secondary | ICD-10-CM | POA: Diagnosis not present

## 2016-11-03 DIAGNOSIS — M545 Low back pain: Secondary | ICD-10-CM | POA: Diagnosis not present

## 2016-11-03 DIAGNOSIS — Z79899 Other long term (current) drug therapy: Secondary | ICD-10-CM | POA: Diagnosis not present

## 2016-12-01 DIAGNOSIS — F1120 Opioid dependence, uncomplicated: Secondary | ICD-10-CM | POA: Diagnosis not present

## 2016-12-01 DIAGNOSIS — M545 Low back pain: Secondary | ICD-10-CM | POA: Diagnosis not present

## 2016-12-01 DIAGNOSIS — Z5181 Encounter for therapeutic drug level monitoring: Secondary | ICD-10-CM | POA: Diagnosis not present

## 2016-12-01 DIAGNOSIS — G2581 Restless legs syndrome: Secondary | ICD-10-CM | POA: Diagnosis not present

## 2016-12-01 DIAGNOSIS — I10 Essential (primary) hypertension: Secondary | ICD-10-CM | POA: Diagnosis not present

## 2016-12-01 DIAGNOSIS — Z79891 Long term (current) use of opiate analgesic: Secondary | ICD-10-CM | POA: Diagnosis not present

## 2016-12-01 DIAGNOSIS — M25511 Pain in right shoulder: Secondary | ICD-10-CM | POA: Diagnosis not present

## 2016-12-01 DIAGNOSIS — Z79899 Other long term (current) drug therapy: Secondary | ICD-10-CM | POA: Diagnosis not present

## 2016-12-27 DIAGNOSIS — I10 Essential (primary) hypertension: Secondary | ICD-10-CM | POA: Diagnosis not present

## 2016-12-27 DIAGNOSIS — E039 Hypothyroidism, unspecified: Secondary | ICD-10-CM | POA: Diagnosis not present

## 2016-12-27 DIAGNOSIS — F419 Anxiety disorder, unspecified: Secondary | ICD-10-CM | POA: Diagnosis not present

## 2016-12-29 DIAGNOSIS — E039 Hypothyroidism, unspecified: Secondary | ICD-10-CM | POA: Diagnosis not present

## 2016-12-29 DIAGNOSIS — F1120 Opioid dependence, uncomplicated: Secondary | ICD-10-CM | POA: Diagnosis not present

## 2016-12-29 DIAGNOSIS — M6281 Muscle weakness (generalized): Secondary | ICD-10-CM | POA: Diagnosis not present

## 2016-12-29 DIAGNOSIS — F17210 Nicotine dependence, cigarettes, uncomplicated: Secondary | ICD-10-CM | POA: Diagnosis not present

## 2016-12-29 DIAGNOSIS — Z5181 Encounter for therapeutic drug level monitoring: Secondary | ICD-10-CM | POA: Diagnosis not present

## 2016-12-29 DIAGNOSIS — Z23 Encounter for immunization: Secondary | ICD-10-CM | POA: Diagnosis not present

## 2016-12-29 DIAGNOSIS — G2581 Restless legs syndrome: Secondary | ICD-10-CM | POA: Diagnosis not present

## 2016-12-29 DIAGNOSIS — Z79899 Other long term (current) drug therapy: Secondary | ICD-10-CM | POA: Diagnosis not present

## 2016-12-29 DIAGNOSIS — Z0001 Encounter for general adult medical examination with abnormal findings: Secondary | ICD-10-CM | POA: Diagnosis not present

## 2016-12-29 DIAGNOSIS — K219 Gastro-esophageal reflux disease without esophagitis: Secondary | ICD-10-CM | POA: Diagnosis not present

## 2016-12-29 DIAGNOSIS — E785 Hyperlipidemia, unspecified: Secondary | ICD-10-CM | POA: Diagnosis not present

## 2016-12-29 DIAGNOSIS — M25511 Pain in right shoulder: Secondary | ICD-10-CM | POA: Diagnosis not present

## 2016-12-29 DIAGNOSIS — I10 Essential (primary) hypertension: Secondary | ICD-10-CM | POA: Diagnosis not present

## 2016-12-29 DIAGNOSIS — F419 Anxiety disorder, unspecified: Secondary | ICD-10-CM | POA: Diagnosis not present

## 2017-02-06 DIAGNOSIS — Z5181 Encounter for therapeutic drug level monitoring: Secondary | ICD-10-CM | POA: Diagnosis not present

## 2017-02-06 DIAGNOSIS — F1120 Opioid dependence, uncomplicated: Secondary | ICD-10-CM | POA: Diagnosis not present

## 2017-02-06 DIAGNOSIS — M25511 Pain in right shoulder: Secondary | ICD-10-CM | POA: Diagnosis not present

## 2017-02-06 DIAGNOSIS — Z79891 Long term (current) use of opiate analgesic: Secondary | ICD-10-CM | POA: Diagnosis not present

## 2017-02-06 DIAGNOSIS — M545 Low back pain: Secondary | ICD-10-CM | POA: Diagnosis not present

## 2017-02-06 DIAGNOSIS — I10 Essential (primary) hypertension: Secondary | ICD-10-CM | POA: Diagnosis not present

## 2017-02-06 DIAGNOSIS — Z79899 Other long term (current) drug therapy: Secondary | ICD-10-CM | POA: Diagnosis not present

## 2017-02-06 DIAGNOSIS — E039 Hypothyroidism, unspecified: Secondary | ICD-10-CM | POA: Diagnosis not present

## 2017-03-08 DIAGNOSIS — Z79891 Long term (current) use of opiate analgesic: Secondary | ICD-10-CM | POA: Diagnosis not present

## 2017-03-08 DIAGNOSIS — W19XXXA Unspecified fall, initial encounter: Secondary | ICD-10-CM | POA: Diagnosis not present

## 2017-03-08 DIAGNOSIS — G2581 Restless legs syndrome: Secondary | ICD-10-CM | POA: Diagnosis not present

## 2017-03-08 DIAGNOSIS — Z79899 Other long term (current) drug therapy: Secondary | ICD-10-CM | POA: Diagnosis not present

## 2017-03-08 DIAGNOSIS — Z5181 Encounter for therapeutic drug level monitoring: Secondary | ICD-10-CM | POA: Diagnosis not present

## 2017-03-08 DIAGNOSIS — M545 Low back pain: Secondary | ICD-10-CM | POA: Diagnosis not present

## 2017-03-08 DIAGNOSIS — F1120 Opioid dependence, uncomplicated: Secondary | ICD-10-CM | POA: Diagnosis not present

## 2017-03-08 DIAGNOSIS — I10 Essential (primary) hypertension: Secondary | ICD-10-CM | POA: Diagnosis not present

## 2017-04-02 ENCOUNTER — Other Ambulatory Visit: Payer: Self-pay | Admitting: Physician Assistant

## 2017-04-03 NOTE — Telephone Encounter (Signed)
Last OV 06/24/2015 Last refill xanax 03/02/2016 Okay to refill?

## 2017-04-03 NOTE — Telephone Encounter (Signed)
Over due for OV and labs. NTBS.

## 2017-04-04 NOTE — Telephone Encounter (Signed)
Letter mailed for patient to call and schedule an appointment.

## 2017-04-05 DIAGNOSIS — M549 Dorsalgia, unspecified: Secondary | ICD-10-CM | POA: Diagnosis not present

## 2017-04-05 DIAGNOSIS — Z79899 Other long term (current) drug therapy: Secondary | ICD-10-CM | POA: Diagnosis not present

## 2017-04-05 DIAGNOSIS — J309 Allergic rhinitis, unspecified: Secondary | ICD-10-CM | POA: Diagnosis not present

## 2017-04-05 DIAGNOSIS — M25511 Pain in right shoulder: Secondary | ICD-10-CM | POA: Diagnosis not present

## 2017-04-05 DIAGNOSIS — F1120 Opioid dependence, uncomplicated: Secondary | ICD-10-CM | POA: Diagnosis not present

## 2017-04-05 DIAGNOSIS — J0100 Acute maxillary sinusitis, unspecified: Secondary | ICD-10-CM | POA: Diagnosis not present

## 2017-04-05 DIAGNOSIS — H6692 Otitis media, unspecified, left ear: Secondary | ICD-10-CM | POA: Diagnosis not present

## 2017-04-05 DIAGNOSIS — Z79891 Long term (current) use of opiate analgesic: Secondary | ICD-10-CM | POA: Diagnosis not present

## 2017-04-05 DIAGNOSIS — Z5181 Encounter for therapeutic drug level monitoring: Secondary | ICD-10-CM | POA: Diagnosis not present

## 2017-05-03 DIAGNOSIS — F1120 Opioid dependence, uncomplicated: Secondary | ICD-10-CM | POA: Diagnosis not present

## 2017-05-03 DIAGNOSIS — Z79899 Other long term (current) drug therapy: Secondary | ICD-10-CM | POA: Diagnosis not present

## 2017-05-03 DIAGNOSIS — E039 Hypothyroidism, unspecified: Secondary | ICD-10-CM | POA: Diagnosis not present

## 2017-05-03 DIAGNOSIS — M79606 Pain in leg, unspecified: Secondary | ICD-10-CM | POA: Diagnosis not present

## 2017-05-03 DIAGNOSIS — Z5181 Encounter for therapeutic drug level monitoring: Secondary | ICD-10-CM | POA: Diagnosis not present

## 2017-05-03 DIAGNOSIS — F419 Anxiety disorder, unspecified: Secondary | ICD-10-CM | POA: Diagnosis not present

## 2017-05-03 DIAGNOSIS — E668 Other obesity: Secondary | ICD-10-CM | POA: Diagnosis not present

## 2017-05-03 DIAGNOSIS — M545 Low back pain: Secondary | ICD-10-CM | POA: Diagnosis not present

## 2017-05-03 DIAGNOSIS — Z79891 Long term (current) use of opiate analgesic: Secondary | ICD-10-CM | POA: Diagnosis not present

## 2017-05-03 DIAGNOSIS — G2581 Restless legs syndrome: Secondary | ICD-10-CM | POA: Diagnosis not present

## 2017-05-10 ENCOUNTER — Other Ambulatory Visit: Payer: Self-pay | Admitting: Physician Assistant

## 2017-05-31 DIAGNOSIS — E039 Hypothyroidism, unspecified: Secondary | ICD-10-CM | POA: Diagnosis not present

## 2017-05-31 DIAGNOSIS — Z5181 Encounter for therapeutic drug level monitoring: Secondary | ICD-10-CM | POA: Diagnosis not present

## 2017-05-31 DIAGNOSIS — F1120 Opioid dependence, uncomplicated: Secondary | ICD-10-CM | POA: Diagnosis not present

## 2017-05-31 DIAGNOSIS — M545 Low back pain: Secondary | ICD-10-CM | POA: Diagnosis not present

## 2017-05-31 DIAGNOSIS — M79606 Pain in leg, unspecified: Secondary | ICD-10-CM | POA: Diagnosis not present

## 2017-05-31 DIAGNOSIS — G4709 Other insomnia: Secondary | ICD-10-CM | POA: Diagnosis not present

## 2017-05-31 DIAGNOSIS — I10 Essential (primary) hypertension: Secondary | ICD-10-CM | POA: Diagnosis not present

## 2017-05-31 DIAGNOSIS — F419 Anxiety disorder, unspecified: Secondary | ICD-10-CM | POA: Diagnosis not present

## 2017-05-31 DIAGNOSIS — G2581 Restless legs syndrome: Secondary | ICD-10-CM | POA: Diagnosis not present

## 2017-05-31 DIAGNOSIS — Z79891 Long term (current) use of opiate analgesic: Secondary | ICD-10-CM | POA: Diagnosis not present

## 2017-05-31 DIAGNOSIS — Z79899 Other long term (current) drug therapy: Secondary | ICD-10-CM | POA: Diagnosis not present

## 2017-06-28 DIAGNOSIS — F1120 Opioid dependence, uncomplicated: Secondary | ICD-10-CM | POA: Diagnosis not present

## 2017-06-28 DIAGNOSIS — N3946 Mixed incontinence: Secondary | ICD-10-CM | POA: Diagnosis not present

## 2017-06-28 DIAGNOSIS — M545 Low back pain: Secondary | ICD-10-CM | POA: Diagnosis not present

## 2017-06-28 DIAGNOSIS — Z79891 Long term (current) use of opiate analgesic: Secondary | ICD-10-CM | POA: Diagnosis not present

## 2017-06-28 DIAGNOSIS — M79606 Pain in leg, unspecified: Secondary | ICD-10-CM | POA: Diagnosis not present

## 2017-06-28 DIAGNOSIS — J309 Allergic rhinitis, unspecified: Secondary | ICD-10-CM | POA: Diagnosis not present

## 2017-06-28 DIAGNOSIS — Z79899 Other long term (current) drug therapy: Secondary | ICD-10-CM | POA: Diagnosis not present

## 2017-06-28 DIAGNOSIS — Z5181 Encounter for therapeutic drug level monitoring: Secondary | ICD-10-CM | POA: Diagnosis not present

## 2017-07-26 DIAGNOSIS — M545 Low back pain: Secondary | ICD-10-CM | POA: Diagnosis not present

## 2017-07-26 DIAGNOSIS — M79606 Pain in leg, unspecified: Secondary | ICD-10-CM | POA: Diagnosis not present

## 2017-07-26 DIAGNOSIS — F419 Anxiety disorder, unspecified: Secondary | ICD-10-CM | POA: Diagnosis not present

## 2017-07-26 DIAGNOSIS — Z5181 Encounter for therapeutic drug level monitoring: Secondary | ICD-10-CM | POA: Diagnosis not present

## 2017-07-26 DIAGNOSIS — F1120 Opioid dependence, uncomplicated: Secondary | ICD-10-CM | POA: Diagnosis not present

## 2017-07-26 DIAGNOSIS — Z79891 Long term (current) use of opiate analgesic: Secondary | ICD-10-CM | POA: Diagnosis not present

## 2017-07-26 DIAGNOSIS — N3946 Mixed incontinence: Secondary | ICD-10-CM | POA: Diagnosis not present

## 2017-07-26 DIAGNOSIS — G4709 Other insomnia: Secondary | ICD-10-CM | POA: Diagnosis not present

## 2017-07-26 DIAGNOSIS — Z79899 Other long term (current) drug therapy: Secondary | ICD-10-CM | POA: Diagnosis not present

## 2017-07-26 DIAGNOSIS — R52 Pain, unspecified: Secondary | ICD-10-CM | POA: Diagnosis not present

## 2017-08-28 DIAGNOSIS — F1120 Opioid dependence, uncomplicated: Secondary | ICD-10-CM | POA: Diagnosis not present

## 2017-08-28 DIAGNOSIS — Z79891 Long term (current) use of opiate analgesic: Secondary | ICD-10-CM | POA: Diagnosis not present

## 2017-08-28 DIAGNOSIS — Z5181 Encounter for therapeutic drug level monitoring: Secondary | ICD-10-CM | POA: Diagnosis not present

## 2017-08-28 DIAGNOSIS — M545 Low back pain: Secondary | ICD-10-CM | POA: Diagnosis not present

## 2017-08-28 DIAGNOSIS — G4709 Other insomnia: Secondary | ICD-10-CM | POA: Diagnosis not present

## 2017-08-28 DIAGNOSIS — F419 Anxiety disorder, unspecified: Secondary | ICD-10-CM | POA: Diagnosis not present

## 2017-08-28 DIAGNOSIS — Z79899 Other long term (current) drug therapy: Secondary | ICD-10-CM | POA: Diagnosis not present

## 2017-08-28 DIAGNOSIS — R600 Localized edema: Secondary | ICD-10-CM | POA: Diagnosis not present

## 2017-09-28 DIAGNOSIS — M545 Low back pain: Secondary | ICD-10-CM | POA: Diagnosis not present

## 2017-09-28 DIAGNOSIS — F1120 Opioid dependence, uncomplicated: Secondary | ICD-10-CM | POA: Diagnosis not present

## 2017-09-28 DIAGNOSIS — G4709 Other insomnia: Secondary | ICD-10-CM | POA: Diagnosis not present

## 2017-09-28 DIAGNOSIS — Z79899 Other long term (current) drug therapy: Secondary | ICD-10-CM | POA: Diagnosis not present

## 2017-09-28 DIAGNOSIS — F419 Anxiety disorder, unspecified: Secondary | ICD-10-CM | POA: Diagnosis not present

## 2017-09-28 DIAGNOSIS — Z5181 Encounter for therapeutic drug level monitoring: Secondary | ICD-10-CM | POA: Diagnosis not present

## 2017-09-28 DIAGNOSIS — M79606 Pain in leg, unspecified: Secondary | ICD-10-CM | POA: Diagnosis not present

## 2017-09-28 DIAGNOSIS — R52 Pain, unspecified: Secondary | ICD-10-CM | POA: Diagnosis not present

## 2017-09-28 DIAGNOSIS — Z79891 Long term (current) use of opiate analgesic: Secondary | ICD-10-CM | POA: Diagnosis not present

## 2017-09-28 DIAGNOSIS — R252 Cramp and spasm: Secondary | ICD-10-CM | POA: Diagnosis not present

## 2017-10-14 ENCOUNTER — Other Ambulatory Visit: Payer: Self-pay | Admitting: Physician Assistant

## 2017-10-26 DIAGNOSIS — Z5181 Encounter for therapeutic drug level monitoring: Secondary | ICD-10-CM | POA: Diagnosis not present

## 2017-10-26 DIAGNOSIS — M25511 Pain in right shoulder: Secondary | ICD-10-CM | POA: Diagnosis not present

## 2017-10-26 DIAGNOSIS — Z79899 Other long term (current) drug therapy: Secondary | ICD-10-CM | POA: Diagnosis not present

## 2017-10-26 DIAGNOSIS — G2581 Restless legs syndrome: Secondary | ICD-10-CM | POA: Diagnosis not present

## 2017-10-26 DIAGNOSIS — M545 Low back pain: Secondary | ICD-10-CM | POA: Diagnosis not present

## 2017-10-26 DIAGNOSIS — E785 Hyperlipidemia, unspecified: Secondary | ICD-10-CM | POA: Diagnosis not present

## 2017-10-26 DIAGNOSIS — Z79891 Long term (current) use of opiate analgesic: Secondary | ICD-10-CM | POA: Diagnosis not present

## 2017-10-26 DIAGNOSIS — F1120 Opioid dependence, uncomplicated: Secondary | ICD-10-CM | POA: Diagnosis not present

## 2017-11-28 DIAGNOSIS — Z79891 Long term (current) use of opiate analgesic: Secondary | ICD-10-CM | POA: Diagnosis not present

## 2017-11-28 DIAGNOSIS — Z5181 Encounter for therapeutic drug level monitoring: Secondary | ICD-10-CM | POA: Diagnosis not present

## 2017-11-28 DIAGNOSIS — Z79899 Other long term (current) drug therapy: Secondary | ICD-10-CM | POA: Diagnosis not present

## 2017-11-28 DIAGNOSIS — M79604 Pain in right leg: Secondary | ICD-10-CM | POA: Diagnosis not present

## 2017-11-28 DIAGNOSIS — F1120 Opioid dependence, uncomplicated: Secondary | ICD-10-CM | POA: Diagnosis not present

## 2017-11-28 DIAGNOSIS — M79605 Pain in left leg: Secondary | ICD-10-CM | POA: Diagnosis not present

## 2017-11-28 DIAGNOSIS — I10 Essential (primary) hypertension: Secondary | ICD-10-CM | POA: Diagnosis not present

## 2017-11-28 DIAGNOSIS — G2581 Restless legs syndrome: Secondary | ICD-10-CM | POA: Diagnosis not present

## 2017-11-28 DIAGNOSIS — R21 Rash and other nonspecific skin eruption: Secondary | ICD-10-CM | POA: Diagnosis not present

## 2017-11-28 DIAGNOSIS — M545 Low back pain: Secondary | ICD-10-CM | POA: Diagnosis not present

## 2017-12-20 DIAGNOSIS — Z79899 Other long term (current) drug therapy: Secondary | ICD-10-CM | POA: Diagnosis not present

## 2017-12-20 DIAGNOSIS — G4709 Other insomnia: Secondary | ICD-10-CM | POA: Diagnosis not present

## 2017-12-20 DIAGNOSIS — E039 Hypothyroidism, unspecified: Secondary | ICD-10-CM | POA: Diagnosis not present

## 2017-12-20 DIAGNOSIS — G2581 Restless legs syndrome: Secondary | ICD-10-CM | POA: Diagnosis not present

## 2017-12-20 DIAGNOSIS — I10 Essential (primary) hypertension: Secondary | ICD-10-CM | POA: Diagnosis not present

## 2017-12-20 DIAGNOSIS — M545 Low back pain: Secondary | ICD-10-CM | POA: Diagnosis not present

## 2017-12-20 DIAGNOSIS — Z9181 History of falling: Secondary | ICD-10-CM | POA: Diagnosis not present

## 2017-12-20 DIAGNOSIS — M25511 Pain in right shoulder: Secondary | ICD-10-CM | POA: Diagnosis not present

## 2017-12-20 DIAGNOSIS — F1120 Opioid dependence, uncomplicated: Secondary | ICD-10-CM | POA: Diagnosis not present

## 2017-12-20 DIAGNOSIS — Z79891 Long term (current) use of opiate analgesic: Secondary | ICD-10-CM | POA: Diagnosis not present

## 2017-12-20 DIAGNOSIS — Z5181 Encounter for therapeutic drug level monitoring: Secondary | ICD-10-CM | POA: Diagnosis not present

## 2017-12-20 DIAGNOSIS — M6281 Muscle weakness (generalized): Secondary | ICD-10-CM | POA: Diagnosis not present

## 2017-12-22 DIAGNOSIS — Z79899 Other long term (current) drug therapy: Secondary | ICD-10-CM | POA: Diagnosis not present

## 2017-12-22 DIAGNOSIS — I10 Essential (primary) hypertension: Secondary | ICD-10-CM | POA: Diagnosis not present

## 2017-12-22 DIAGNOSIS — E785 Hyperlipidemia, unspecified: Secondary | ICD-10-CM | POA: Diagnosis not present

## 2017-12-22 DIAGNOSIS — E039 Hypothyroidism, unspecified: Secondary | ICD-10-CM | POA: Diagnosis not present

## 2018-01-15 DIAGNOSIS — Z23 Encounter for immunization: Secondary | ICD-10-CM | POA: Diagnosis not present

## 2018-01-15 DIAGNOSIS — E6609 Other obesity due to excess calories: Secondary | ICD-10-CM | POA: Diagnosis not present

## 2018-01-15 DIAGNOSIS — Z79891 Long term (current) use of opiate analgesic: Secondary | ICD-10-CM | POA: Diagnosis not present

## 2018-01-15 DIAGNOSIS — Z0000 Encounter for general adult medical examination without abnormal findings: Secondary | ICD-10-CM | POA: Diagnosis not present

## 2018-01-15 DIAGNOSIS — M545 Low back pain: Secondary | ICD-10-CM | POA: Diagnosis not present

## 2018-01-15 DIAGNOSIS — Z5181 Encounter for therapeutic drug level monitoring: Secondary | ICD-10-CM | POA: Diagnosis not present

## 2018-01-15 DIAGNOSIS — I10 Essential (primary) hypertension: Secondary | ICD-10-CM | POA: Diagnosis not present

## 2018-01-15 DIAGNOSIS — F1120 Opioid dependence, uncomplicated: Secondary | ICD-10-CM | POA: Diagnosis not present

## 2018-01-15 DIAGNOSIS — E785 Hyperlipidemia, unspecified: Secondary | ICD-10-CM | POA: Diagnosis not present

## 2018-01-15 DIAGNOSIS — Z79899 Other long term (current) drug therapy: Secondary | ICD-10-CM | POA: Diagnosis not present

## 2018-01-15 DIAGNOSIS — Z6833 Body mass index (BMI) 33.0-33.9, adult: Secondary | ICD-10-CM | POA: Diagnosis not present

## 2018-02-12 DIAGNOSIS — F1120 Opioid dependence, uncomplicated: Secondary | ICD-10-CM | POA: Diagnosis not present

## 2018-02-12 DIAGNOSIS — Z79899 Other long term (current) drug therapy: Secondary | ICD-10-CM | POA: Diagnosis not present

## 2018-02-12 DIAGNOSIS — M79604 Pain in right leg: Secondary | ICD-10-CM | POA: Diagnosis not present

## 2018-02-12 DIAGNOSIS — Z79891 Long term (current) use of opiate analgesic: Secondary | ICD-10-CM | POA: Diagnosis not present

## 2018-02-12 DIAGNOSIS — M545 Low back pain: Secondary | ICD-10-CM | POA: Diagnosis not present

## 2018-02-12 DIAGNOSIS — M25511 Pain in right shoulder: Secondary | ICD-10-CM | POA: Diagnosis not present

## 2018-02-12 DIAGNOSIS — I10 Essential (primary) hypertension: Secondary | ICD-10-CM | POA: Diagnosis not present

## 2018-02-12 DIAGNOSIS — G2581 Restless legs syndrome: Secondary | ICD-10-CM | POA: Diagnosis not present

## 2018-02-12 DIAGNOSIS — Z5181 Encounter for therapeutic drug level monitoring: Secondary | ICD-10-CM | POA: Diagnosis not present

## 2018-02-12 DIAGNOSIS — M79605 Pain in left leg: Secondary | ICD-10-CM | POA: Diagnosis not present

## 2018-03-12 DIAGNOSIS — G4709 Other insomnia: Secondary | ICD-10-CM | POA: Diagnosis not present

## 2018-03-12 DIAGNOSIS — G2581 Restless legs syndrome: Secondary | ICD-10-CM | POA: Diagnosis not present

## 2018-03-12 DIAGNOSIS — Z79891 Long term (current) use of opiate analgesic: Secondary | ICD-10-CM | POA: Diagnosis not present

## 2018-03-12 DIAGNOSIS — M545 Low back pain: Secondary | ICD-10-CM | POA: Diagnosis not present

## 2018-03-12 DIAGNOSIS — M25511 Pain in right shoulder: Secondary | ICD-10-CM | POA: Diagnosis not present

## 2018-03-12 DIAGNOSIS — Z79899 Other long term (current) drug therapy: Secondary | ICD-10-CM | POA: Diagnosis not present

## 2018-03-12 DIAGNOSIS — K219 Gastro-esophageal reflux disease without esophagitis: Secondary | ICD-10-CM | POA: Diagnosis not present

## 2018-03-12 DIAGNOSIS — Z5181 Encounter for therapeutic drug level monitoring: Secondary | ICD-10-CM | POA: Diagnosis not present

## 2018-03-12 DIAGNOSIS — I10 Essential (primary) hypertension: Secondary | ICD-10-CM | POA: Diagnosis not present

## 2018-03-12 DIAGNOSIS — F1120 Opioid dependence, uncomplicated: Secondary | ICD-10-CM | POA: Diagnosis not present

## 2018-04-09 DIAGNOSIS — I10 Essential (primary) hypertension: Secondary | ICD-10-CM | POA: Diagnosis not present

## 2018-04-09 DIAGNOSIS — M545 Low back pain: Secondary | ICD-10-CM | POA: Diagnosis not present

## 2018-04-09 DIAGNOSIS — M25511 Pain in right shoulder: Secondary | ICD-10-CM | POA: Diagnosis not present

## 2018-04-09 DIAGNOSIS — Z79891 Long term (current) use of opiate analgesic: Secondary | ICD-10-CM | POA: Diagnosis not present

## 2018-04-09 DIAGNOSIS — N3281 Overactive bladder: Secondary | ICD-10-CM | POA: Diagnosis not present

## 2018-04-09 DIAGNOSIS — G894 Chronic pain syndrome: Secondary | ICD-10-CM | POA: Diagnosis not present

## 2018-04-09 DIAGNOSIS — F1120 Opioid dependence, uncomplicated: Secondary | ICD-10-CM | POA: Diagnosis not present

## 2018-04-09 DIAGNOSIS — Z79899 Other long term (current) drug therapy: Secondary | ICD-10-CM | POA: Diagnosis not present

## 2018-04-09 DIAGNOSIS — Z5181 Encounter for therapeutic drug level monitoring: Secondary | ICD-10-CM | POA: Diagnosis not present

## 2018-04-09 DIAGNOSIS — F17290 Nicotine dependence, other tobacco product, uncomplicated: Secondary | ICD-10-CM | POA: Diagnosis not present

## 2018-05-09 DIAGNOSIS — G894 Chronic pain syndrome: Secondary | ICD-10-CM | POA: Diagnosis not present

## 2018-05-09 DIAGNOSIS — M545 Low back pain: Secondary | ICD-10-CM | POA: Diagnosis not present

## 2018-05-09 DIAGNOSIS — Z79899 Other long term (current) drug therapy: Secondary | ICD-10-CM | POA: Diagnosis not present

## 2018-05-09 DIAGNOSIS — R05 Cough: Secondary | ICD-10-CM | POA: Diagnosis not present

## 2018-05-09 DIAGNOSIS — Z79891 Long term (current) use of opiate analgesic: Secondary | ICD-10-CM | POA: Diagnosis not present

## 2018-05-09 DIAGNOSIS — Z5181 Encounter for therapeutic drug level monitoring: Secondary | ICD-10-CM | POA: Diagnosis not present

## 2018-05-09 DIAGNOSIS — F1120 Opioid dependence, uncomplicated: Secondary | ICD-10-CM | POA: Diagnosis not present

## 2018-06-06 DIAGNOSIS — E039 Hypothyroidism, unspecified: Secondary | ICD-10-CM | POA: Diagnosis not present

## 2018-06-06 DIAGNOSIS — G894 Chronic pain syndrome: Secondary | ICD-10-CM | POA: Diagnosis not present

## 2018-06-06 DIAGNOSIS — Z79899 Other long term (current) drug therapy: Secondary | ICD-10-CM | POA: Diagnosis not present

## 2018-06-06 DIAGNOSIS — G4700 Insomnia, unspecified: Secondary | ICD-10-CM | POA: Diagnosis not present

## 2018-06-06 DIAGNOSIS — E785 Hyperlipidemia, unspecified: Secondary | ICD-10-CM | POA: Diagnosis not present

## 2018-06-06 DIAGNOSIS — Z79891 Long term (current) use of opiate analgesic: Secondary | ICD-10-CM | POA: Diagnosis not present

## 2018-06-06 DIAGNOSIS — M545 Low back pain: Secondary | ICD-10-CM | POA: Diagnosis not present

## 2018-07-04 DIAGNOSIS — E039 Hypothyroidism, unspecified: Secondary | ICD-10-CM | POA: Diagnosis not present

## 2018-07-04 DIAGNOSIS — I10 Essential (primary) hypertension: Secondary | ICD-10-CM | POA: Diagnosis not present

## 2018-07-04 DIAGNOSIS — E785 Hyperlipidemia, unspecified: Secondary | ICD-10-CM | POA: Diagnosis not present

## 2018-07-11 DIAGNOSIS — G894 Chronic pain syndrome: Secondary | ICD-10-CM | POA: Diagnosis not present

## 2018-07-11 DIAGNOSIS — Z79891 Long term (current) use of opiate analgesic: Secondary | ICD-10-CM | POA: Diagnosis not present

## 2018-07-11 DIAGNOSIS — E039 Hypothyroidism, unspecified: Secondary | ICD-10-CM | POA: Diagnosis not present

## 2018-07-11 DIAGNOSIS — Z79899 Other long term (current) drug therapy: Secondary | ICD-10-CM | POA: Diagnosis not present

## 2018-07-11 DIAGNOSIS — M545 Low back pain: Secondary | ICD-10-CM | POA: Diagnosis not present

## 2018-07-11 DIAGNOSIS — I10 Essential (primary) hypertension: Secondary | ICD-10-CM | POA: Diagnosis not present

## 2018-07-11 DIAGNOSIS — E785 Hyperlipidemia, unspecified: Secondary | ICD-10-CM | POA: Diagnosis not present

## 2019-04-28 ENCOUNTER — Ambulatory Visit: Payer: Medicare Other

## 2019-05-12 ENCOUNTER — Ambulatory Visit: Payer: Medicare Other

## 2020-01-04 ENCOUNTER — Emergency Department (HOSPITAL_COMMUNITY)
Admission: EM | Admit: 2020-01-04 | Discharge: 2020-01-04 | Disposition: A | Discharge status: Home / Self Care | Payer: Medicare Other | Attending: Emergency Medicine | Admitting: Emergency Medicine

## 2020-01-04 ENCOUNTER — Emergency Department (HOSPITAL_COMMUNITY): Payer: Medicare Other

## 2020-01-04 ENCOUNTER — Other Ambulatory Visit: Payer: Self-pay

## 2020-01-04 ENCOUNTER — Encounter (HOSPITAL_COMMUNITY): Payer: Self-pay | Admitting: Emergency Medicine

## 2020-01-04 DIAGNOSIS — K219 Gastro-esophageal reflux disease without esophagitis: Secondary | ICD-10-CM | POA: Diagnosis not present

## 2020-01-04 DIAGNOSIS — Z87891 Personal history of nicotine dependence: Secondary | ICD-10-CM | POA: Insufficient documentation

## 2020-01-04 DIAGNOSIS — K5732 Diverticulitis of large intestine without perforation or abscess without bleeding: Secondary | ICD-10-CM | POA: Insufficient documentation

## 2020-01-04 DIAGNOSIS — Z79899 Other long term (current) drug therapy: Secondary | ICD-10-CM | POA: Diagnosis not present

## 2020-01-04 DIAGNOSIS — I10 Essential (primary) hypertension: Secondary | ICD-10-CM | POA: Insufficient documentation

## 2020-01-04 DIAGNOSIS — R109 Unspecified abdominal pain: Secondary | ICD-10-CM | POA: Diagnosis present

## 2020-01-04 DIAGNOSIS — R072 Precordial pain: Secondary | ICD-10-CM | POA: Diagnosis not present

## 2020-01-04 DIAGNOSIS — Z20822 Contact with and (suspected) exposure to covid-19: Secondary | ICD-10-CM | POA: Diagnosis not present

## 2020-01-04 DIAGNOSIS — K5792 Diverticulitis of intestine, part unspecified, without perforation or abscess without bleeding: Secondary | ICD-10-CM

## 2020-01-04 LAB — RESPIRATORY PANEL BY RT PCR (FLU A&B, COVID)
Influenza A by PCR: NEGATIVE
Influenza B by PCR: NEGATIVE
SARS Coronavirus 2 by RT PCR: NEGATIVE

## 2020-01-04 LAB — COMPREHENSIVE METABOLIC PANEL
ALT: 11 U/L (ref 0–44)
AST: 11 U/L — ABNORMAL LOW (ref 15–41)
Albumin: 3.2 g/dL — ABNORMAL LOW (ref 3.5–5.0)
Alkaline Phosphatase: 98 U/L (ref 38–126)
Anion gap: 11 (ref 5–15)
BUN: 18 mg/dL (ref 8–23)
CO2: 26 mmol/L (ref 22–32)
Calcium: 9.6 mg/dL (ref 8.9–10.3)
Chloride: 102 mmol/L (ref 98–111)
Creatinine, Ser: 0.76 mg/dL (ref 0.44–1.00)
GFR, Estimated: >60 mL/min (ref >60)
Glucose, Bld: 115 mg/dL — ABNORMAL HIGH (ref 70–99)
Potassium: 4 mmol/L (ref 3.5–5.1)
Sodium: 139 mmol/L (ref 135–145)
Total Bilirubin: 0.4 mg/dL (ref 0.3–1.2)
Total Protein: 7.1 g/dL (ref 6.5–8.1)

## 2020-01-04 LAB — LIPASE, BLOOD: Lipase: 28 U/L (ref 11–51)

## 2020-01-04 LAB — CBC
HCT: 38.8 % (ref 36.0–46.0)
Hemoglobin: 11.7 g/dL — ABNORMAL LOW (ref 12.0–15.0)
MCH: 27.1 pg (ref 26.0–34.0)
MCHC: 30.2 g/dL (ref 30.0–36.0)
MCV: 90 fL (ref 80.0–100.0)
Platelets: 470 10*3/uL — ABNORMAL HIGH (ref 150–400)
RBC: 4.31 MIL/uL (ref 3.87–5.11)
RDW: 16.3 % — ABNORMAL HIGH (ref 11.5–15.5)
WBC: 13.4 10*3/uL — ABNORMAL HIGH (ref 4.0–10.5)
nRBC: 0 % (ref 0.0–0.2)

## 2020-01-04 LAB — TROPONIN I (HIGH SENSITIVITY)
Troponin I (High Sensitivity): 14 ng/L (ref <18)
Troponin I (High Sensitivity): 14 ng/L (ref <18)

## 2020-01-04 MED ORDER — PIPERACILLIN-TAZOBACTAM 3.375 G IVPB 30 MIN
3.3750 g | Freq: Once | INTRAVENOUS | Status: AC
  Administered 2020-01-04: 3.375 g via INTRAVENOUS
  Filled 2020-01-04: qty 50

## 2020-01-04 MED ORDER — HYDROMORPHONE HCL 1 MG/ML IJ SOLN
0.5000 mg | Freq: Once | INTRAMUSCULAR | Status: AC
  Administered 2020-01-04: 0.5 mg via INTRAVENOUS
  Filled 2020-01-04: qty 1

## 2020-01-04 MED ORDER — AMOXICILLIN-POT CLAVULANATE 875-125 MG PO TABS: 1.0000 | ORAL_TABLET | Freq: Two times a day (BID) | ORAL | 0 refills | Status: DC

## 2020-01-04 MED ORDER — ONDANSETRON HCL 4 MG/2ML IJ SOLN
4.0000 mg | Freq: Once | INTRAMUSCULAR | Status: AC
  Administered 2020-01-04: 4 mg via INTRAVENOUS
  Filled 2020-01-04: qty 2

## 2020-01-04 MED ORDER — MORPHINE SULFATE (PF) 4 MG/ML IV SOLN
4.0000 mg | Freq: Once | INTRAVENOUS | Status: AC
  Administered 2020-01-04: 4 mg via INTRAVENOUS
  Filled 2020-01-04: qty 1

## 2020-01-04 MED ORDER — FAMOTIDINE IN NACL 20-0.9 MG/50ML-% IV SOLN
20.0000 mg | Freq: Once | INTRAVENOUS | Status: AC
  Administered 2020-01-04: 20 mg via INTRAVENOUS
  Filled 2020-01-04: qty 50

## 2020-01-04 MED ORDER — IOHEXOL 350 MG/ML SOLN
100.0000 mL | Freq: Once | INTRAVENOUS | Status: AC | PRN
  Administered 2020-01-04: 100 mL via INTRAVENOUS

## 2020-01-04 NOTE — ED Provider Notes (Signed)
[facility] EMERGENCY DEPARTMENT Provider Note   CSN: [id number] Arrival date & time: 01/04/20  1522     History Chief Complaint  Patient presents with  . Chest Pain  . Abdominal Pain    Brenda Hopkins is a 79 y.o. female.  Patient c/o lower chest and abd pain for the past 6 days. Symptoms acute onset, mod-severe, dull, constant, persistent, non radiating, without specific exacerbating or alleviating factors. Denies hx same pain. Denies associated sob, vomiting, or diaphoresis. Pain is not pleuritic. No cough or uri symptoms. No fever or chills. No hx pud, pancreatitis or gallstones. Denies hx cad. Indicates normal bm yesterday, denies constipation, no abd distension. Remote prior abd surgeries include hysterectomy, appendectomy, and gastric bypass. No back/flank pain. No dysuria or gu c/o.   The history is provided by the patient.  Chest Pain Associated symptoms: abdominal pain   Associated symptoms: no back pain, no cough, no fever, no headache, no shortness of breath and no vomiting   Abdominal Pain Associated symptoms: chest pain   Associated symptoms: no chills, no constipation, no cough, no diarrhea, no dysuria, no fever, no shortness of breath, no sore throat and no vomiting        Past Medical History:  Diagnosis Date  . Chronic back pain   . GERD (gastroesophageal reflux disease)   . Hiatal hernia   . Hyperlipidemia   . Hypertension   . Pelvic fracture (HCC) 12/12/2012  . Smoker     Patient Active Problem List   Diagnosis Date Noted  . Insomnia 05/13/2014  . Generalized anxiety disorder 07/15/2013  . Hypertension   . Chronic back pain   . Hyperlipidemia   . GERD (gastroesophageal reflux disease)   . Hiatal hernia   . Smoker   . Pelvic fracture (HCC) 12/12/2012    Past Surgical History:  Procedure Laterality Date  . ABDOMINAL HYSTERECTOMY    . APPENDECTOMY    . 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 HEMORROIDECTOMY    . LUMBAR SPINE SURGERY    . OOPHORECTOMY     . TONSILLECTOMY       OB History   No obstetric history on file.     No family history on file.  Social History   Tobacco Use  . Smoking status: Former Smoker    Packs/day: 0.50    Types: Cigarettes  . Smokeless tobacco: Former Neurosurgeon    Quit date: 12/28/2012  Vaping Use  . Vaping Use: Every day  Substance Use Topics  . Alcohol use: No  . Drug use: No    Home Medications Prior to Admission medications   Medication Sig Start Date End Date Taking? Authorizing Provider  ALPRAZolam Prudy Feeler) 0.5 MG tablet Take 1 tablet (0.5 mg total) by mouth 2 (two) times daily as needed. 03/02/16   Allayne Butcher B, PA-C  amLODipine (NORVASC) 10 MG tablet TAKE 1 TABLET BY MOUTH EVERY DAY 09/28/16   Allayne Butcher B, PA-C  atorvastatin (LIPITOR) 20 MG tablet TAKE 1 TABLET BY MOUTH EVERY NIGHT AT BEDTIME 07/11/16   Dixon, Patriciaann Clan, PA-C  diphenoxylate-atropine (LOMOTIL) 2.5-0.025 MG tablet Take 1 tablet by mouth 4 (four) times daily as needed for diarrhea or loose stools. 06/03/15   Donnetta Hutching, MD  gabapentin (NEURONTIN) 600 MG tablet TAKE 1 TABLET BY MOUTH THREE TIMES DAILY 07/13/15   Dorena Bodo, PA-C  Multiple Vitamin (MULTIVITAMIN) tablet Take 1 tablet by mouth daily.    [provider]  ondansetron (ZOFRAN) 4 MG  tablet Take 1 tablet (4 mg total) by mouth every 6 (six) hours. 06/03/15   Donnetta Hutching, MD  oxyCODONE (ROXICODONE) 15 MG immediate release tablet Take 15 mg by mouth 4 (four) times daily.    [provider]  pantoprazole (PROTONIX) 40 MG tablet TAKE 1 TABLET BY MOUTH EACH DAY 12/25/15   Allayne Butcher B, PA-C  pantoprazole (PROTONIX) 40 MG tablet TAKE 1 TABLET BY MOUTH EVERY DAY 05/02/16   Allayne Butcher B, PA-C  pantoprazole (PROTONIX) 40 MG tablet TAKE 1 TABLET BY MOUTH EVERY DAY 05/30/16   Allayne Butcher B, PA-C  pantoprazole (PROTONIX) 40 MG tablet TAKE 1 TABLET BY MOUTH EVERY DAY 06/30/16   Allayne Butcher B, PA-C  pantoprazole (PROTONIX) 40 MG tablet TAKE 1 TABLET BY MOUTH EVERY DAY  10/16/17   Allayne Butcher B, PA-C  tiZANidine (ZANAFLEX) 4 MG tablet TAKE 1 TABLET BY MOUTH EVERY 8 HOURS AS NEEDED 12/03/15   Allayne Butcher B, PA-C  traZODone (DESYREL) 50 MG tablet TAKE 1 TABLET BY MOUTH AT BEDTIME AS NEEDED 07/23/15   Allayne Butcher B, PA-C  traZODone (DESYREL) 50 MG tablet TAKE 1 TABLET(50 MG) BY MOUTH AT BEDTIME AS NEEDED 12/03/15   Dorena Bodo, PA-C    Allergies    Prozac [fluoxetine hcl], Zoloft [sertraline hcl], and Nicoderm [nicotine]  Review of Systems   Review of Systems  Constitutional: Negative for chills and fever.  HENT: Negative for sore throat.   Eyes: Negative for redness.  Respiratory: Negative for cough and shortness of breath.   Cardiovascular: Positive for chest pain. Negative for leg swelling.  Gastrointestinal: Positive for abdominal pain. Negative for constipation, diarrhea and vomiting.  Endocrine: Negative for polyuria.  Genitourinary: Negative for dysuria and flank pain.  Musculoskeletal: Negative for back pain and neck pain.  Skin: Negative for rash.  Neurological: Negative for headaches.  Hematological: Does not bruise/bleed easily.  Psychiatric/Behavioral: Negative for confusion.    Physical Exam Updated Vital Signs BP (!) 174/103 (BP Location: Left Arm)   Pulse 98   Temp 99.3 F (37.4 C) (Oral)   Resp (!) 30   Ht 1.549 m (5\' 1" )   Wt 80.3 kg   SpO2 96%   BMI 33.44 kg/m   Physical Exam Vitals and nursing note reviewed.  Constitutional:      Appearance: Normal appearance. She is well-developed.  HENT:     Head: Atraumatic.     Nose: Nose normal.     Mouth/Throat:     Mouth: Mucous membranes are moist.  Eyes:     General: No scleral icterus.    Conjunctiva/sclera: Conjunctivae normal.  Neck:     Trachea: No tracheal deviation.  Cardiovascular:     Rate and Rhythm: Normal rate and regular rhythm.     Pulses: Normal pulses.     Heart sounds: Normal heart sounds. No murmur heard.  No friction rub. No gallop.   Pulmonary:      Effort: Pulmonary effort is normal. No respiratory distress.     Breath sounds: Normal breath sounds.  Abdominal:     General: Bowel sounds are normal. There is no distension.     Palpations: Abdomen is soft. There is no mass.     Tenderness: There is abdominal tenderness. There is no guarding or rebound.     Comments: Mid to upper abd tenderness.   Genitourinary:    Comments: No cva tenderness.  Musculoskeletal:        General: No swelling or tenderness.  Cervical back: Normal range of motion and neck supple. No rigidity. No muscular tenderness.  Skin:    General: Skin is warm and dry.     Findings: No rash.  Neurological:     Mental Status: She is alert.     Comments: Alert, speech normal.   Psychiatric:        Mood and Affect: Mood normal.      ED Results / Procedures / Treatments   Labs (all labs ordered are listed, but only abnormal results are displayed) Results for orders placed or performed during the hospital encounter of 01/04/20  Respiratory Panel by RT PCR (Flu A&B, Covid) - Nasopharyngeal Swab   Specimen: Nasopharyngeal Swab  Result Value Ref Range   SARS Coronavirus 2 by RT PCR NEGATIVE NEGATIVE   Influenza A by PCR NEGATIVE NEGATIVE   Influenza B by PCR NEGATIVE NEGATIVE  Comprehensive metabolic panel  Result Value Ref Range   Sodium 139 135 - 145 mmol/L   Potassium 4.0 3.5 - 5.1 mmol/L   Chloride 102 98 - 111 mmol/L   CO2 26 22 - 32 mmol/L   Glucose, Bld 115 (H) 70 - 99 mg/dL   BUN 18 8 - 23 mg/dL   Creatinine, Ser 6.01 0.44 - 1.00 mg/dL   Calcium 9.6 8.9 - 09.3 mg/dL   Total Protein 7.1 6.5 - 8.1 g/dL   Albumin 3.2 (L) 3.5 - 5.0 g/dL   AST 11 (L) 15 - 41 U/L   ALT 11 0 - 44 U/L   Alkaline Phosphatase 98 38 - 126 U/L   Total Bilirubin 0.4 0.3 - 1.2 mg/dL   GFR, Estimated >23 >55 mL/min   Anion gap 11 5 - 15  CBC  Result Value Ref Range   WBC 13.4 (H) 4.0 - 10.5 K/uL   RBC 4.31 3.87 - 5.11 MIL/uL   Hemoglobin 11.7 (L) 12.0 - 15.0 g/dL   HCT  73.2 36 - 46 %   MCV 90.0 80.0 - 100.0 fL   MCH 27.1 26.0 - 34.0 pg   MCHC 30.2 30.0 - 36.0 g/dL   RDW 20.2 (H) 54.2 - 70.6 %   Platelets 470 (H) 150 - 400 K/uL   nRBC 0.0 0.0 - 0.2 %  Lipase, blood  Result Value Ref Range   Lipase 28 11 - 51 U/L  Troponin I (High Sensitivity)  Result Value Ref Range   Troponin I (High Sensitivity) 14 <18 ng/L  Troponin I (High Sensitivity)  Result Value Ref Range   Troponin I (High Sensitivity) 14 <18 ng/L   No results found.  EKG EKG Interpretation  Date/Time:  Saturday January 04 2020 15:32:40 EDT Ventricular Rate:  99 PR Interval:  140 QRS Duration: 80 QT Interval:  358 QTC Calculation: 459 R Axis:   -13 Text Interpretation: Sinus rhythm with Premature supraventricular complexes No previous tracing Confirmed by Cathren Laine ([id number]) on 01/04/2020 4:11:11 PM   Radiology CT Angio Chest/Abd/Pel for Dissection W and/or W/WO  Result Date: 01/04/2020 CLINICAL DATA:  Chest pain and abdominal pain x6 days. EXAM: CT ANGIOGRAPHY CHEST, ABDOMEN AND PELVIS TECHNIQUE: Non-contrast CT of the chest was initially obtained. Multidetector CT imaging through the chest, abdomen and pelvis was performed using the standard protocol during bolus administration of intravenous contrast. Multiplanar reconstructed images and MIPs were obtained and reviewed to evaluate the vascular anatomy. CONTRAST:  OMNIPAQUE IOHEXOL 350 MG/ML SOLN COMPARISON:  None. FINDINGS: CTA CHEST FINDINGS Cardiovascular: There is moderate to marked severity  calcification of the thoracic aorta, without evidence of aneurysmal dilatation. Satisfactory opacification of the pulmonary arteries to the segmental level. No evidence of pulmonary embolism. Normal heart size with marked severity coronary artery calcification. No pericardial effusion. Mediastinum/Nodes: No enlarged mediastinal, hilar, or axillary lymph nodes. Thyroid gland, trachea, and esophagus demonstrate no significant findings.  Lungs/Pleura: Mild linear scarring and/or atelectasis is seen within the bilateral lung bases and inferior aspect of the left upper lobe. There is no evidence of a pleural effusion or pneumothorax. Musculoskeletal: A chronic versus congenital appearing linear area is seen involving the body of the sternum. Numerous chronic right-sided rib fractures are seen. A chronic compression fracture deformity is seen involving the T9 vertebral body. Review of the MIP images confirms the above findings. CTA ABDOMEN AND PELVIS FINDINGS VASCULAR Aorta: Marked severity calcification, without aneurysm, dissection, vasculitis or significant stenosis. Celiac: Patent without evidence of aneurysm, dissection, vasculitis or significant stenosis. SMA: Patent without evidence of aneurysm, dissection, vasculitis or significant stenosis. Renals: Both renal arteries are patent without evidence of aneurysm, dissection, vasculitis, fibromuscular dysplasia or significant stenosis. IMA: Patent without evidence of aneurysm, dissection, vasculitis or significant stenosis. Inflow: Marked severity calcification without evidence of aneurysm, dissection, vasculitis or significant stenosis. Veins: No obvious venous abnormality within the limitations of this arterial phase study. Review of the MIP images confirms the above findings. NON-VASCULAR Hepatobiliary: There is mild diffuse fatty infiltration of the liver parenchyma. No focal liver abnormality is seen. The gallbladder is moderately distended without evidence of gallstones, gallbladder wall thickening, or biliary dilatation. Pancreas: Unremarkable. No pancreatic ductal dilatation or surrounding inflammatory changes. Spleen: Normal in size without focal abnormality. Adrenals/Urinary Tract: There is mild diffuse bilateral adrenal gland enlargement. Kidneys are normal, without renal calculi, focal lesion, or hydronephrosis. A moderate amount of heterogeneous, mildly increased attenuation is seen  within the dependent portion of the urinary bladder lumen. Stomach/Bowel: There is a small to moderate sized hiatal hernia. Surgical sutures are seen within the adjacent portion of the gastric region. The appendix is surgically absent. Surgically anastomosed bowel is seen within the anterior aspect of the mid to lower abdomen. No evidence of bowel dilatation. Numerous diverticula are seen throughout the sigmoid colon. Mild thickening of the mid sigmoid colon is seen. Lymphatic: No abnormal abdominal or pelvic lymph nodes are identified. Reproductive: Status post hysterectomy. No adnexal masses. Other: No abdominal wall hernia or abnormality. No abdominopelvic ascites. Musculoskeletal: Chronic fracture deformities are seen involving the right superior and right inferior pubic rami. Bilateral metallic density pedicle screws are seen at the levels of L5 and S1. Approximately 1.1 cm anterolisthesis of the L5 vertebral body is seen on S1. Review of the MIP images confirms the above findings. IMPRESSION: 1. No evidence of aortic dissection or aneurysmal dilatation. 2. Marked severity coronary artery calcification. 3. Fatty liver. 4. Small to moderate sized hiatal hernia. 5. Sigmoid diverticulosis with mild thickening of the mid sigmoid colon which may represent mild acute diverticulitis. 6. Postoperative changes within the abdomen and pelvis. 7. Chronic fracture deformities involving the right superior and right inferior pubic rami. 8. 1.1 cm anterolisthesis of the L5 vertebral body on S1. 9. Aortic atherosclerosis. Aortic Atherosclerosis (ICD10-I70.0). Electronically Signed   By: Aram Candelahaddeus  Houston M.D.   On: 01/04/2020 22:55    Procedures Procedures (including critical care time)  Medications Ordered in ED Medications  morphine 4 MG/ML injection 4 mg (has no administration in time range)  ondansetron (ZOFRAN) injection 4 mg (has no administration in time range)  famotidine (PEPCID) IVPB 20 mg premix (has no  administration in time range)    ED Course  I have reviewed the triage vital signs and the nursing notes.  Pertinent labs & imaging results that were available during my care of the patient were reviewed by me and considered in my medical decision making (see chart for details).    MDM Rules/Calculators/A&P                         Iv ns bolus. Morphine iv. zofran iv. pepcid iv. Ecg. Stat labs.   Reviewed nursing notes and prior charts for additional history.   MDM Number of Diagnoses or Management Options   Amount and/or Complexity of Data Reviewed Clinical lab tests: ordered and reviewed Tests in the radiology section of CPT: ordered and reviewed Tests in the medicine section of CPT: ordered and reviewed Discussion of test results with the performing providers: yes Decide to obtain previous medical records or to obtain history from someone other than the patient: yes Obtain history from someone other than the patient: yes Review and summarize past medical records: yes Discuss the patient with other providers: yes Independent visualization of images, tracings, or specimens: yes  Risk of Complications, Morbidity, and/or Mortality Presenting problems: high Diagnostic procedures: high Management options: high   Labs reviewed/interpreted by me - chem normal.  Patients abd pain and tenderness persists, will get ct.   Recheck, pain improved but persists. Dilaudid .5 mg iv.   Await ct results.   CT reviewed/interpreted by me - diverticulitis, mild, acute.   Additional labs reviewed/interpreted by me - delta trop normal. Initial and delta trop normal and not increasing. Recheck pt, no cp or discomfort. No sob. Coronary calc noted on imaging - will have f/u cardiology.   +lower abd tenderness. No peritoneal signs. Pain improved. Zosyn iv.   rx augmentin for home.   Pt appears stable for d/c.   Return precautions provided.      Final Clinical Impression(s) / ED  Diagnoses Final diagnoses:  None    Rx / DC Orders ED Discharge Orders    None       Cathren Laine, MD 01/04/20 2304

## 2020-01-04 NOTE — ED Notes (Signed)
Patient transported to CT 

## 2020-01-04 NOTE — ED Triage Notes (Signed)
Pt reports Cp into abd x 6 days   Followed by Robbie Lis but has not contacted   Here for eval due to no better   Last BM yesterday   "I've been eating tums"

## 2020-01-04 NOTE — ED Notes (Signed)
Called CT to notify the needed IV line has been established. Per CT they will be over in a couple of minutes.

## 2020-01-04 NOTE — ED Notes (Signed)
Dr. Steinl at bedside 

## 2020-01-04 NOTE — Discharge Instructions (Addendum)
It was our pleasure to provide your ER care today - we hope that you feel better.  Rest. Drink plenty of fluids.   Take antibiotic (augmentin) as prescribed.   Take acetaminophen as need for pain. You may also take your pain medication as need.   Follow up with primary care doctor in 1 week if symptoms fail to improve/resolve.  For chest discomfort, follow up with cardiologist in the next 1-2 weeks - call office to arrange appointment.   Return to ER if worse, new symptoms, worsening or severe pain, persistent vomiting, high fevers, recurrent or persistent chest pain, trouble breathing, or other concern.   You were given pain medication in the ER - no driving for the next 8 hours, of if taking opiate pain medication.

## 2020-01-13 ENCOUNTER — Encounter (HOSPITAL_COMMUNITY): Payer: Self-pay

## 2020-01-13 ENCOUNTER — Other Ambulatory Visit: Payer: Self-pay

## 2020-01-13 ENCOUNTER — Emergency Department (HOSPITAL_COMMUNITY): Payer: Medicare Other

## 2020-01-13 ENCOUNTER — Emergency Department (HOSPITAL_COMMUNITY)
Admission: EM | Admit: 2020-01-13 | Discharge: 2020-01-13 | Disposition: A | Discharge status: Home / Self Care | Payer: Medicare Other | Source: Home / Self Care | Attending: Emergency Medicine | Admitting: Emergency Medicine

## 2020-01-13 DIAGNOSIS — K219 Gastro-esophageal reflux disease without esophagitis: Secondary | ICD-10-CM | POA: Insufficient documentation

## 2020-01-13 DIAGNOSIS — R079 Chest pain, unspecified: Secondary | ICD-10-CM | POA: Insufficient documentation

## 2020-01-13 DIAGNOSIS — R1013 Epigastric pain: Secondary | ICD-10-CM

## 2020-01-13 DIAGNOSIS — Z9884 Bariatric surgery status: Secondary | ICD-10-CM | POA: Insufficient documentation

## 2020-01-13 DIAGNOSIS — Z87891 Personal history of nicotine dependence: Secondary | ICD-10-CM | POA: Insufficient documentation

## 2020-01-13 DIAGNOSIS — Z9089 Acquired absence of other organs: Secondary | ICD-10-CM | POA: Insufficient documentation

## 2020-01-13 DIAGNOSIS — K2100 Gastro-esophageal reflux disease with esophagitis, without bleeding: Secondary | ICD-10-CM | POA: Diagnosis not present

## 2020-01-13 DIAGNOSIS — I10 Essential (primary) hypertension: Secondary | ICD-10-CM | POA: Insufficient documentation

## 2020-01-13 DIAGNOSIS — R1084 Generalized abdominal pain: Secondary | ICD-10-CM

## 2020-01-13 LAB — CBC WITH DIFFERENTIAL/PLATELET
Abs Immature Granulocytes: 0.05 10*3/uL (ref 0.00–0.07)
Basophils Absolute: 0.1 10*3/uL (ref 0.0–0.1)
Basophils Relative: 1 %
Eosinophils Absolute: 0.2 10*3/uL (ref 0.0–0.5)
Eosinophils Relative: 2 %
HCT: 35 % — ABNORMAL LOW (ref 36.0–46.0)
Hemoglobin: 10.2 g/dL — ABNORMAL LOW (ref 12.0–15.0)
Immature Granulocytes: 1 %
Lymphocytes Relative: 11 %
Lymphs Abs: 1.1 10*3/uL (ref 0.7–4.0)
MCH: 26.8 pg (ref 26.0–34.0)
MCHC: 29.1 g/dL — ABNORMAL LOW (ref 30.0–36.0)
MCV: 91.9 fL (ref 80.0–100.0)
Monocytes Absolute: 0.6 10*3/uL (ref 0.1–1.0)
Monocytes Relative: 6 %
Neutro Abs: 8 10*3/uL — ABNORMAL HIGH (ref 1.7–7.7)
Neutrophils Relative %: 79 %
Platelets: 505 10*3/uL — ABNORMAL HIGH (ref 150–400)
RBC: 3.81 MIL/uL — ABNORMAL LOW (ref 3.87–5.11)
RDW: 16.7 % — ABNORMAL HIGH (ref 11.5–15.5)
WBC: 9.9 10*3/uL (ref 4.0–10.5)
nRBC: 0 % (ref 0.0–0.2)

## 2020-01-13 LAB — COMPREHENSIVE METABOLIC PANEL
ALT: 13 U/L (ref 0–44)
AST: 13 U/L — ABNORMAL LOW (ref 15–41)
Albumin: 3 g/dL — ABNORMAL LOW (ref 3.5–5.0)
Alkaline Phosphatase: 78 U/L (ref 38–126)
Anion gap: 5 (ref 5–15)
BUN: 17 mg/dL (ref 8–23)
CO2: 26 mmol/L (ref 22–32)
Calcium: 8.3 mg/dL — ABNORMAL LOW (ref 8.9–10.3)
Chloride: 104 mmol/L (ref 98–111)
Creatinine, Ser: 0.77 mg/dL (ref 0.44–1.00)
GFR, Estimated: >60 mL/min (ref >60)
Glucose, Bld: 142 mg/dL — ABNORMAL HIGH (ref 70–99)
Potassium: 4.1 mmol/L (ref 3.5–5.1)
Sodium: 135 mmol/L (ref 135–145)
Total Bilirubin: 0.2 mg/dL — ABNORMAL LOW (ref 0.3–1.2)
Total Protein: 6.4 g/dL — ABNORMAL LOW (ref 6.5–8.1)

## 2020-01-13 LAB — URINALYSIS, ROUTINE W REFLEX MICROSCOPIC
Bilirubin Urine: NEGATIVE
Glucose, UA: NEGATIVE mg/dL
Hgb urine dipstick: NEGATIVE
Ketones, ur: NEGATIVE mg/dL
Leukocytes,Ua: NEGATIVE
Nitrite: NEGATIVE
Protein, ur: NEGATIVE mg/dL
Specific Gravity, Urine: 1.044 — ABNORMAL HIGH (ref 1.005–1.030)
pH: 6 (ref 5.0–8.0)

## 2020-01-13 LAB — TROPONIN I (HIGH SENSITIVITY)
Troponin I (High Sensitivity): 11 ng/L (ref <18)
Troponin I (High Sensitivity): 11 ng/L (ref <18)

## 2020-01-13 LAB — LACTIC ACID, PLASMA
Lactic Acid, Venous: 0.5 mmol/L (ref 0.5–1.9)
Lactic Acid, Venous: 0.9 mmol/L (ref 0.5–1.9)

## 2020-01-13 LAB — LIPASE, BLOOD: Lipase: 16 U/L (ref 11–51)

## 2020-01-13 MED ORDER — IOHEXOL 300 MG/ML  SOLN
100.0000 mL | Freq: Once | INTRAMUSCULAR | Status: AC | PRN
  Administered 2020-01-13: 100 mL via INTRAVENOUS

## 2020-01-13 MED ORDER — ONDANSETRON HCL 4 MG/2ML IJ SOLN
4.0000 mg | Freq: Once | INTRAMUSCULAR | Status: AC
  Administered 2020-01-13: 4 mg via INTRAVENOUS
  Filled 2020-01-13: qty 2

## 2020-01-13 MED ORDER — MORPHINE SULFATE (PF) 4 MG/ML IV SOLN
4.0000 mg | Freq: Once | INTRAVENOUS | Status: AC
  Administered 2020-01-13: 4 mg via INTRAVENOUS
  Filled 2020-01-13: qty 1

## 2020-01-13 MED ORDER — SODIUM CHLORIDE 0.9 % IV BOLUS
500.0000 mL | Freq: Once | INTRAVENOUS | Status: AC
  Administered 2020-01-13: 500 mL via INTRAVENOUS

## 2020-01-13 MED ORDER — LORAZEPAM 2 MG/ML IJ SOLN
1.0000 mg | Freq: Once | INTRAMUSCULAR | Status: AC
  Administered 2020-01-13: 1 mg via INTRAVENOUS
  Filled 2020-01-13: qty 1

## 2020-01-13 MED ORDER — LORAZEPAM 2 MG/ML IJ SOLN
0.5000 mg | Freq: Once | INTRAMUSCULAR | Status: AC
  Administered 2020-01-13: 0.5 mg via INTRAVENOUS
  Filled 2020-01-13: qty 1

## 2020-01-13 MED ORDER — HALOPERIDOL LACTATE 5 MG/ML IJ SOLN
2.0000 mg | Freq: Once | INTRAMUSCULAR | Status: AC
  Administered 2020-01-13: 2 mg via INTRAVENOUS
  Filled 2020-01-13: qty 1

## 2020-01-13 NOTE — ED Provider Notes (Signed)
7:30 PM-the patient is currently upset, and has been removing monitoring devices, anxious and uncomfortable.  Patient's daughter with her in the room and states that she feels like the patient is anxious.  Daughter states that patient improved, when she was given Ativan.  Patient has chronic pain and takes high-dose narcotic analgesia multiple doses each day.  Additional medication ordered to control symptoms.  Patient is not currently have signs of active diverticulitis therefore does not require antibiotic treatment at this time.  No other acute abnormalities found on the current evaluation.   She has chronic pain and has a modest improvement of her pain during ED evaluation and treatment.  Today there is no evidence for diverticulitis on the CT imaging.  Patient nontoxic and primarily anxious.  No indication for hospitalization or further ED intervention.  Patient stable at time of discharge.  Findings discussed with patient, and daughter, all questions answered   Mancel Bale, MD 01/14/20 1236

## 2020-01-13 NOTE — ED Notes (Signed)
PT agitated and verbally abusive to staff. She is adamant she wants to go home and is leaving now.  I ask if she is willing to talk to the doctor first and she states she is, daughter at bedside also wanting to leave now.  Dr. Effie Shy informed and goes to bedside where he talks to the patient.  Pt wanting to be discharged now IV removed, pt assisted to personal vehicle via wheelchair where her daughter is driving and leaves the ED under her own power.

## 2020-01-13 NOTE — ED Provider Notes (Signed)
Emergency Department Provider Note   I have reviewed the triage vital signs and the nursing notes.   HISTORY  Chief Complaint Chest Pain   HPI Satori Krabill is a 79 y.o. female with past medical history reviewed below including recent ED visit where she was diagnosed with diverticulitis and discharged home with p.o. Augmentin presents to the emergency department for evaluation of worsening abdominal and chest pain.  Patient describes a bandlike pain across the lower chest/upper abdomen.  She tells me that she lost her prescription for the Augmentin and stopped taking it 2 days ago.  She denies fevers.  She denies shortness of breath.  No radiation of symptoms or other modifying factors. Patient reports return of symptoms in the last 24 hours and pain is severe, non-radiating, and worse than prior.     Past Medical History:  Diagnosis Date  . Chronic back pain   . GERD (gastroesophageal reflux disease)   . Hiatal hernia   . Hyperlipidemia   . Hypertension   . Pelvic fracture (HCC) 12/12/2012  . Smoker     Patient Active Problem List   Diagnosis Date Noted  . Insomnia 05/13/2014  . Generalized anxiety disorder 07/15/2013  . Hypertension   . Chronic back pain   . Hyperlipidemia   . GERD (gastroesophageal reflux disease)   . Hiatal hernia   . Smoker   . Pelvic fracture (HCC) 12/12/2012    Past Surgical History:  Procedure Laterality Date  . ABDOMINAL HYSTERECTOMY    . APPENDECTOMY    . 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 HEMORROIDECTOMY    . LUMBAR SPINE SURGERY    . OOPHORECTOMY    . TONSILLECTOMY      Allergies Prozac [fluoxetine hcl], Zoloft [sertraline hcl], and Nicoderm [nicotine]  History reviewed. No pertinent family history.  Social History Social History   Tobacco Use  . Smoking status: Former Smoker    Packs/day: 0.50    Types: Cigarettes  . Smokeless tobacco: Former Neurosurgeon    Quit date: 12/28/2012  Vaping Use  . Vaping Use: Every day  Substance Use  Topics  . Alcohol use: No  . Drug use: No    Review of Systems  Constitutional: No fever/chills Eyes: No visual changes. ENT: No sore throat. Cardiovascular: Positive chest pain. Respiratory: Denies shortness of breath. Gastrointestinal: Positive abdominal pain.  No nausea, no vomiting.  No diarrhea.  No constipation. Genitourinary: Negative for dysuria. Musculoskeletal: Negative for back pain. Skin: Negative for rash. Neurological: Negative for headaches, focal weakness or numbness.  10-point ROS otherwise negative.  ____________________________________________   PHYSICAL EXAM:  VITAL SIGNS: ED Triage Vitals  Enc Vitals Group     BP 01/13/20 1127 (!) 176/103     Pulse Rate 01/13/20 1127 98     Resp 01/13/20 1127 (!) 29     Temp 01/13/20 1132 98.7 F (37.1 C)     Temp Source 01/13/20 1132 Oral     SpO2 01/13/20 1127 98 %     Weight 01/13/20 1125 175 lb (79.4 kg)     Height 01/13/20 1125 5\' 1"  (1.549 m)    Constitutional: Alert and oriented. Able to provide a history but appears uncomfortable.  Eyes: Conjunctivae are normal.  Head: Atraumatic. Nose: No congestion/rhinnorhea. Mouth/Throat: Mucous membranes are moist.   Neck: No stridor.   Cardiovascular: Normal rate, regular rhythm. Good peripheral circulation. Grossly normal heart sounds.   Respiratory: Normal respiratory effort.  No retractions. Lungs CTAB. Gastrointestinal: Soft with diffuse  tenderness. No rebound or guarding. Palpable periumbilical hernia which is soft with no overlying skin changes. No distention.  Musculoskeletal: No lower extremity tenderness nor edema. Neurologic:  Normal speech and language. No gross focal neurologic deficits are appreciated.  Skin:  Skin is warm, dry and intact. No rash noted.  ____________________________________________   LABS (all labs ordered are listed, but only abnormal results are displayed)  Labs Reviewed  COMPREHENSIVE METABOLIC PANEL - Abnormal; Notable for  the following components:      Result Value   Glucose, Bld 142 (*)    Calcium 8.3 (*)    Total Protein 6.4 (*)    Albumin 3.0 (*)    AST 13 (*)    Total Bilirubin 0.2 (*)    All other components within normal limits  CBC WITH DIFFERENTIAL/PLATELET - Abnormal; Notable for the following components:   RBC 3.81 (*)    Hemoglobin 10.2 (*)    HCT 35.0 (*)    MCHC 29.1 (*)    RDW 16.7 (*)    Platelets 505 (*)    Neutro Abs 8.0 (*)    All other components within normal limits  LIPASE, BLOOD  LACTIC ACID, PLASMA  URINALYSIS, ROUTINE W REFLEX MICROSCOPIC  LACTIC ACID, PLASMA  TROPONIN I (HIGH SENSITIVITY)  TROPONIN I (HIGH SENSITIVITY)   ____________________________________________  EKG   EKG Interpretation  Date/Time:  Monday January 13 2020 11:26:51 EDT Ventricular Rate:  101 PR Interval:    QRS Duration: 95 QT Interval:  369 QTC Calculation: 472 R Axis:   -16 Text Interpretation: Sinus tachycardia Ventricular premature complex Borderline left axis deviation Anterior infarct, old Nonspecific T abnormalities, lateral leads Baseline wander in lead(s) V3 No STEMI Confirmed by Alona Bene [phone]) on 01/13/2020 11:39:15 AM       ____________________________________________  RADIOLOGY  DG Chest Portable 1 View  Result Date: 01/13/2020 CLINICAL DATA:  Chest pain. EXAM: PORTABLE CHEST 1 VIEW COMPARISON:  CTA chest, abdomen, and pelvis dated January 04, 2020. FINDINGS: The heart size and mediastinal contours are within normal limits. Atherosclerotic calcification of the aortic arch. Normal pulmonary vascularity. Unchanged bibasilar scarring. No focal consolidation, pleural effusion, or pneumothorax. No acute osseous abnormality. Old right-sided rib fractures again noted. Advanced degenerative changes of the right shoulder. IMPRESSION: No active disease. Electronically Signed   By: Obie Dredge M.D.   On: 01/13/2020 12:33     ____________________________________________   PROCEDURES  Procedure(s) performed:   Procedures  None  ____________________________________________   INITIAL IMPRESSION / ASSESSMENT AND PLAN / ED COURSE  Pertinent labs & imaging results that were available during my care of the patient were reviewed by me and considered in my medical decision making (see chart for details).   Patient presents to the emergency department for evaluation of return of her chest and abdominal pain.  She had CT imaging of the chest, abdomen, pelvis during her last ED presentation and found to have mild acute diverticulitis.  She stopped taking her Augmentin.  Patient appears very uncomfortable on my initial evaluation but abdominal exam is relatively unremarkable.  I will send a lactic acid in addition to other abdominal labs and chest imaging to start.  Abdominal ischemic processes on my differential given the patient's level of discomfort in comparison to my exam.  Could also be now complicated diverticulitis versus gastritis.  Lower suspicion for ACS or PE but will repeat troponin. EKG without ischemic change.   Labs reviewed and reassuring. Lactate normal. No leukocytosis. CT abdomen/pelvis pending.  Care transferred to Dr. Effie Shy.  ____________________________________________  FINAL CLINICAL IMPRESSION(S) / ED DIAGNOSES  Final diagnoses:  Epigastric pain     MEDICATIONS GIVEN DURING THIS VISIT:  Medications  sodium chloride 0.9 % bolus 500 mL (0 mLs Intravenous Stopped 01/13/20 1307)  morphine 4 MG/ML injection 4 mg (4 mg Intravenous Given 01/13/20 1214)  ondansetron (ZOFRAN) injection 4 mg (4 mg Intravenous Given 01/13/20 1209)  morphine 4 MG/ML injection 4 mg (4 mg Intravenous Given 01/13/20 1333)    Note:  This document was prepared using Dragon voice recognition software and may include unintentional dictation errors.  Alona Bene, MD, [facility] Emergency Medicine    Petrina Melby, Arlyss Repress,  MD 01/14/20 [phone]

## 2020-01-13 NOTE — Discharge Instructions (Signed)
The testing today did not show any serious problems.  You do not have diverticulitis based on the testing done today.  Therefore you do not need to take antibiotics.  Continue taking your pain medicines at home and follow-up with your primary care doctor for checkup in a week or so.  Return here, if needed, for problems.

## 2020-01-15 ENCOUNTER — Other Ambulatory Visit: Payer: Self-pay

## 2020-01-15 ENCOUNTER — Encounter (HOSPITAL_COMMUNITY): Payer: Self-pay | Admitting: Emergency Medicine

## 2020-01-15 ENCOUNTER — Inpatient Hospital Stay (HOSPITAL_COMMUNITY)
Admission: EM | Admit: 2020-01-15 | Discharge: 2020-01-19 | DRG: 391 | Disposition: A | Discharge status: Home / Self Care | Payer: Medicare Other | Attending: Internal Medicine | Admitting: Internal Medicine

## 2020-01-15 ENCOUNTER — Emergency Department (HOSPITAL_COMMUNITY): Payer: Medicare Other

## 2020-01-15 DIAGNOSIS — F17210 Nicotine dependence, cigarettes, uncomplicated: Secondary | ICD-10-CM | POA: Diagnosis present

## 2020-01-15 DIAGNOSIS — M549 Dorsalgia, unspecified: Secondary | ICD-10-CM | POA: Diagnosis present

## 2020-01-15 DIAGNOSIS — F17200 Nicotine dependence, unspecified, uncomplicated: Secondary | ICD-10-CM | POA: Diagnosis present

## 2020-01-15 DIAGNOSIS — R1013 Epigastric pain: Secondary | ICD-10-CM

## 2020-01-15 DIAGNOSIS — G4700 Insomnia, unspecified: Secondary | ICD-10-CM | POA: Diagnosis present

## 2020-01-15 DIAGNOSIS — Z79899 Other long term (current) drug therapy: Secondary | ICD-10-CM

## 2020-01-15 DIAGNOSIS — K439 Ventral hernia without obstruction or gangrene: Secondary | ICD-10-CM

## 2020-01-15 DIAGNOSIS — K449 Diaphragmatic hernia without obstruction or gangrene: Secondary | ICD-10-CM | POA: Diagnosis present

## 2020-01-15 DIAGNOSIS — K2100 Gastro-esophageal reflux disease with esophagitis, without bleeding: Principal | ICD-10-CM | POA: Diagnosis present

## 2020-01-15 DIAGNOSIS — Z20822 Contact with and (suspected) exposure to covid-19: Secondary | ICD-10-CM | POA: Diagnosis present

## 2020-01-15 DIAGNOSIS — D649 Anemia, unspecified: Secondary | ICD-10-CM | POA: Diagnosis not present

## 2020-01-15 DIAGNOSIS — Z9884 Bariatric surgery status: Secondary | ICD-10-CM

## 2020-01-15 DIAGNOSIS — F411 Generalized anxiety disorder: Secondary | ICD-10-CM | POA: Diagnosis present

## 2020-01-15 DIAGNOSIS — J449 Chronic obstructive pulmonary disease, unspecified: Secondary | ICD-10-CM | POA: Diagnosis present

## 2020-01-15 DIAGNOSIS — Z888 Allergy status to other drugs, medicaments and biological substances status: Secondary | ICD-10-CM

## 2020-01-15 DIAGNOSIS — G8929 Other chronic pain: Secondary | ICD-10-CM | POA: Diagnosis present

## 2020-01-15 DIAGNOSIS — J9601 Acute respiratory failure with hypoxia: Secondary | ICD-10-CM | POA: Diagnosis present

## 2020-01-15 DIAGNOSIS — E785 Hyperlipidemia, unspecified: Secondary | ICD-10-CM | POA: Diagnosis present

## 2020-01-15 DIAGNOSIS — G929 Unspecified toxic encephalopathy: Secondary | ICD-10-CM | POA: Diagnosis present

## 2020-01-15 DIAGNOSIS — Z90710 Acquired absence of both cervix and uterus: Secondary | ICD-10-CM

## 2020-01-15 DIAGNOSIS — I10 Essential (primary) hypertension: Secondary | ICD-10-CM | POA: Diagnosis present

## 2020-01-15 DIAGNOSIS — Z9049 Acquired absence of other specified parts of digestive tract: Secondary | ICD-10-CM

## 2020-01-15 DIAGNOSIS — R34 Anuria and oliguria: Secondary | ICD-10-CM | POA: Diagnosis present

## 2020-01-15 DIAGNOSIS — Z79891 Long term (current) use of opiate analgesic: Secondary | ICD-10-CM

## 2020-01-15 DIAGNOSIS — E876 Hypokalemia: Secondary | ICD-10-CM | POA: Diagnosis present

## 2020-01-15 DIAGNOSIS — N179 Acute kidney failure, unspecified: Secondary | ICD-10-CM | POA: Diagnosis present

## 2020-01-15 LAB — CBC WITH DIFFERENTIAL/PLATELET
Abs Immature Granulocytes: 0.06 10*3/uL (ref 0.00–0.07)
Basophils Absolute: 0.1 10*3/uL (ref 0.0–0.1)
Basophils Relative: 0 %
Eosinophils Absolute: 0 10*3/uL (ref 0.0–0.5)
Eosinophils Relative: 0 %
HCT: 37.9 % (ref 36.0–46.0)
Hemoglobin: 11.8 g/dL — ABNORMAL LOW (ref 12.0–15.0)
Immature Granulocytes: 1 %
Lymphocytes Relative: 8 %
Lymphs Abs: 1 10*3/uL (ref 0.7–4.0)
MCH: 26.9 pg (ref 26.0–34.0)
MCHC: 31.1 g/dL (ref 30.0–36.0)
MCV: 86.5 fL (ref 80.0–100.0)
Monocytes Absolute: 0.9 10*3/uL (ref 0.1–1.0)
Monocytes Relative: 7 %
Neutro Abs: 10.8 10*3/uL — ABNORMAL HIGH (ref 1.7–7.7)
Neutrophils Relative %: 84 %
Platelets: 556 10*3/uL — ABNORMAL HIGH (ref 150–400)
RBC: 4.38 MIL/uL (ref 3.87–5.11)
RDW: 16.3 % — ABNORMAL HIGH (ref 11.5–15.5)
WBC: 12.8 10*3/uL — ABNORMAL HIGH (ref 4.0–10.5)
nRBC: 0 % (ref 0.0–0.2)

## 2020-01-15 LAB — COMPREHENSIVE METABOLIC PANEL
ALT: 15 U/L (ref 0–44)
AST: 20 U/L (ref 15–41)
Albumin: 3.6 g/dL (ref 3.5–5.0)
Alkaline Phosphatase: 93 U/L (ref 38–126)
Anion gap: 16 — ABNORMAL HIGH (ref 5–15)
BUN: 12 mg/dL (ref 8–23)
CO2: 28 mmol/L (ref 22–32)
Calcium: 8.4 mg/dL — ABNORMAL LOW (ref 8.9–10.3)
Chloride: 90 mmol/L — ABNORMAL LOW (ref 98–111)
Creatinine, Ser: 0.89 mg/dL (ref 0.44–1.00)
GFR, Estimated: >60 mL/min (ref >60)
Glucose, Bld: 160 mg/dL — ABNORMAL HIGH (ref 70–99)
Potassium: 3 mmol/L — ABNORMAL LOW (ref 3.5–5.1)
Sodium: 134 mmol/L — ABNORMAL LOW (ref 135–145)
Total Bilirubin: 0.7 mg/dL (ref 0.3–1.2)
Total Protein: 7.7 g/dL (ref 6.5–8.1)

## 2020-01-15 LAB — LIPASE, BLOOD: Lipase: 20 U/L (ref 11–51)

## 2020-01-15 LAB — TROPONIN I (HIGH SENSITIVITY)
Troponin I (High Sensitivity): 23 ng/L — ABNORMAL HIGH (ref <18)
Troponin I (High Sensitivity): 23 ng/L — ABNORMAL HIGH (ref <18)

## 2020-01-15 LAB — MAGNESIUM: Magnesium: 1.5 mg/dL — ABNORMAL LOW (ref 1.7–2.4)

## 2020-01-15 LAB — BRAIN NATRIURETIC PEPTIDE: B Natriuretic Peptide: 324 pg/mL — ABNORMAL HIGH (ref 0.0–100.0)

## 2020-01-15 LAB — RESPIRATORY PANEL BY RT PCR (FLU A&B, COVID)
Influenza A by PCR: NEGATIVE
Influenza B by PCR: NEGATIVE
SARS Coronavirus 2 by RT PCR: NEGATIVE

## 2020-01-15 MED ORDER — AMLODIPINE BESYLATE 5 MG PO TABS
10.0000 mg | ORAL_TABLET | Freq: Every day | ORAL | Status: DC
  Administered 2020-01-16: 10 mg via ORAL
  Filled 2020-01-15 (×2): qty 2

## 2020-01-15 MED ORDER — SUCRALFATE 1 G PO TABS
1.0000 g | ORAL_TABLET | Freq: Once | ORAL | Status: AC
  Administered 2020-01-15: 1 g via ORAL
  Filled 2020-01-15: qty 1

## 2020-01-15 MED ORDER — ONDANSETRON HCL 4 MG PO TABS: 4.0000 mg | ORAL_TABLET | Freq: Four times a day (QID) | ORAL | Status: DC | PRN

## 2020-01-15 MED ORDER — IPRATROPIUM-ALBUTEROL 0.5-2.5 (3) MG/3ML IN SOLN: 3.0000 mL | Freq: Four times a day (QID) | RESPIRATORY_TRACT | Status: DC

## 2020-01-15 MED ORDER — HYDROMORPHONE HCL 1 MG/ML IJ SOLN
0.5000 mg | Freq: Once | INTRAMUSCULAR | Status: AC
  Administered 2020-01-15: 0.5 mg via INTRAVENOUS
  Filled 2020-01-15: qty 1

## 2020-01-15 MED ORDER — ACETAMINOPHEN 325 MG PO TABS
650.0000 mg | ORAL_TABLET | Freq: Four times a day (QID) | ORAL | Status: DC | PRN
  Administered 2020-01-18 (×2): 650 mg via ORAL
  Filled 2020-01-15 (×2): qty 2

## 2020-01-15 MED ORDER — NITROGLYCERIN 2 % TD OINT
1.0000 [in_us] | TOPICAL_OINTMENT | Freq: Once | TRANSDERMAL | Status: AC
  Administered 2020-01-15: 1 [in_us] via TOPICAL
  Filled 2020-01-15: qty 1

## 2020-01-15 MED ORDER — LISINOPRIL 10 MG PO TABS
10.0000 mg | ORAL_TABLET | Freq: Every day | ORAL | Status: DC
  Administered 2020-01-16: 10 mg via ORAL
  Filled 2020-01-15 (×2): qty 1

## 2020-01-15 MED ORDER — ATORVASTATIN CALCIUM 20 MG PO TABS
20.0000 mg | ORAL_TABLET | Freq: Every day | ORAL | Status: DC
  Administered 2020-01-15 – 2020-01-18 (×4): 20 mg via ORAL
  Filled 2020-01-15 (×4): qty 1

## 2020-01-15 MED ORDER — PANTOPRAZOLE SODIUM 40 MG IV SOLR
40.0000 mg | Freq: Two times a day (BID) | INTRAVENOUS | Status: DC
  Administered 2020-01-16 – 2020-01-19 (×6): 40 mg via INTRAVENOUS
  Filled 2020-01-15 (×7): qty 40

## 2020-01-15 MED ORDER — LABETALOL HCL 5 MG/ML IV SOLN: 10.0000 mg | INTRAVENOUS | Status: DC | PRN

## 2020-01-15 MED ORDER — LABETALOL HCL 5 MG/ML IV SOLN
20.0000 mg | Freq: Once | INTRAVENOUS | Status: AC
  Administered 2020-01-15: 20 mg via INTRAVENOUS
  Filled 2020-01-15: qty 4

## 2020-01-15 MED ORDER — ACETAMINOPHEN 650 MG RE SUPP: 650.0000 mg | Freq: Four times a day (QID) | RECTAL | Status: DC | PRN

## 2020-01-15 MED ORDER — POTASSIUM CHLORIDE 20 MEQ PO PACK
40.0000 meq | PACK | Freq: Two times a day (BID) | ORAL | Status: DC
  Administered 2020-01-15 – 2020-01-18 (×6): 40 meq via ORAL
  Filled 2020-01-15 (×6): qty 2

## 2020-01-15 MED ORDER — SODIUM CHLORIDE 0.9 % IV BOLUS
1000.0000 mL | Freq: Once | INTRAVENOUS | Status: AC
  Administered 2020-01-15: 1000 mL via INTRAVENOUS

## 2020-01-15 MED ORDER — HYDROMORPHONE HCL 1 MG/ML IJ SOLN: 0.5000 mg | Freq: Once | INTRAMUSCULAR | Status: DC

## 2020-01-15 MED ORDER — LORAZEPAM 2 MG/ML IJ SOLN
0.5000 mg | Freq: Four times a day (QID) | INTRAMUSCULAR | Status: DC | PRN
  Administered 2020-01-17 – 2020-01-18 (×2): 0.5 mg via INTRAVENOUS
  Filled 2020-01-15 (×2): qty 1

## 2020-01-15 MED ORDER — PANTOPRAZOLE SODIUM 40 MG IV SOLR
40.0000 mg | Freq: Once | INTRAVENOUS | Status: AC
  Administered 2020-01-15: 40 mg via INTRAVENOUS
  Filled 2020-01-15: qty 40

## 2020-01-15 MED ORDER — POTASSIUM CHLORIDE 10 MEQ/100ML IV SOLN
10.0000 meq | Freq: Once | INTRAVENOUS | Status: AC
  Administered 2020-01-15: 10 meq via INTRAVENOUS
  Filled 2020-01-15: qty 100

## 2020-01-15 MED ORDER — OXYCODONE HCL 5 MG PO TABS
15.0000 mg | ORAL_TABLET | Freq: Four times a day (QID) | ORAL | Status: DC
  Administered 2020-01-15 – 2020-01-16 (×5): 15 mg via ORAL
  Filled 2020-01-15 (×5): qty 3

## 2020-01-15 MED ORDER — HYDROMORPHONE HCL 1 MG/ML IJ SOLN
1.0000 mg | Freq: Once | INTRAMUSCULAR | Status: AC
  Administered 2020-01-15: 1 mg via INTRAVENOUS
  Filled 2020-01-15: qty 1

## 2020-01-15 MED ORDER — POLYETHYLENE GLYCOL 3350 17 G PO PACK: 17.0000 g | PACK | Freq: Every day | ORAL | Status: DC | PRN

## 2020-01-15 MED ORDER — GABAPENTIN 300 MG PO CAPS
600.0000 mg | ORAL_CAPSULE | Freq: Three times a day (TID) | ORAL | Status: DC
  Administered 2020-01-15 – 2020-01-16 (×4): 600 mg via ORAL
  Filled 2020-01-15 (×5): qty 2

## 2020-01-15 MED ORDER — ROPINIROLE HCL 1 MG PO TABS
3.0000 mg | ORAL_TABLET | Freq: Every day | ORAL | Status: DC
  Administered 2020-01-15 – 2020-01-16 (×2): 3 mg via ORAL
  Filled 2020-01-15 (×2): qty 3

## 2020-01-15 MED ORDER — ONDANSETRON HCL 4 MG/2ML IJ SOLN: 4.0000 mg | Freq: Four times a day (QID) | INTRAMUSCULAR | Status: DC | PRN

## 2020-01-15 MED ORDER — QUETIAPINE FUMARATE 100 MG PO TABS
200.0000 mg | ORAL_TABLET | Freq: Every day | ORAL | Status: DC
  Administered 2020-01-15 – 2020-01-16 (×2): 200 mg via ORAL
  Filled 2020-01-15 (×2): qty 2

## 2020-01-15 MED ORDER — POTASSIUM CHLORIDE 10 MEQ/100ML IV SOLN
10.0000 meq | Freq: Once | INTRAVENOUS | Status: AC
  Administered 2020-01-16: 10 meq via INTRAVENOUS
  Filled 2020-01-15: qty 100

## 2020-01-15 MED ORDER — LORAZEPAM 2 MG/ML IJ SOLN
0.5000 mg | Freq: Once | INTRAMUSCULAR | Status: AC
  Administered 2020-01-15: 0.5 mg via INTRAVENOUS
  Filled 2020-01-15: qty 1

## 2020-01-15 NOTE — ED Triage Notes (Signed)
Pt c/o continued abd and back pain. States she was seen here for same recently but it hasn't got better.

## 2020-01-15 NOTE — ED Provider Notes (Signed)
[facility] EMERGENCY DEPARTMENT Provider Note   CSN: [id number] Arrival date & time: 01/15/20  1904     History Chief Complaint  Patient presents with  . Abdominal Pain    Brenda Hopkins is a 79 y.o. female.  Patient complains of severe epigastric pain.  She was diagnosed with diverticulitis a week ago and was seen here with pain 2 days ago but the pain is not improved.   Patient not complaining of vomiting  The history is provided by the patient. No language interpreter was used.  Abdominal Pain Pain location:  Generalized Pain quality: aching   Pain radiates to:  Does not radiate Pain severity:  Moderate Onset quality:  Sudden Timing:  Constant Progression:  Worsening Chronicity:  Recurrent Context: not alcohol use   Relieved by:  Nothing Associated symptoms: no chest pain, no cough, no diarrhea, no fatigue and no hematuria        Past Medical History:  Diagnosis Date  . Chronic back pain   . GERD (gastroesophageal reflux disease)   . Hiatal hernia   . Hyperlipidemia   . Hypertension   . Pelvic fracture (HCC) 12/12/2012  . Smoker     Patient Active Problem List   Diagnosis Date Noted  . Intractable epigastric abdominal pain 01/15/2020  . Insomnia 05/13/2014  . Generalized anxiety disorder 07/15/2013  . Hypertension   . Chronic back pain   . Hyperlipidemia   . GERD (gastroesophageal reflux disease)   . Hiatal hernia   . Smoker   . Pelvic fracture (HCC) 12/12/2012    Past Surgical History:  Procedure Laterality Date  . ABDOMINAL HYSTERECTOMY    . APPENDECTOMY    . 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 HEMORROIDECTOMY    . LUMBAR SPINE SURGERY    . OOPHORECTOMY    . TONSILLECTOMY       OB History   No obstetric history on file.     History reviewed. No pertinent family history.  Social History   Tobacco Use  . Smoking status: Former Smoker    Packs/day: 0.50    Types: Cigarettes  . Smokeless tobacco: Former Neurosurgeon    Quit date: 12/28/2012   Vaping Use  . Vaping Use: Every day  Substance Use Topics  . Alcohol use: No  . Drug use: No    Home Medications Prior to Admission medications   Medication Sig Start Date End Date Taking? Authorizing Provider  ALPRAZolam Prudy Feeler) 0.5 MG tablet Take 1 tablet (0.5 mg total) by mouth 2 (two) times daily as needed. Patient not taking: Reported on 01/13/2020 03/02/16   Allayne Butcher B, PA-C  amLODipine (NORVASC) 10 MG tablet TAKE 1 TABLET BY MOUTH EVERY DAY 09/28/16   Allayne Butcher B, PA-C  amoxicillin-clavulanate (AUGMENTIN) 875-125 MG tablet Take 1 tablet by mouth every 12 (twelve) hours. Patient not taking: Reported on 01/13/2020 01/04/20   Cathren Laine, MD  atorvastatin (LIPITOR) 20 MG tablet TAKE 1 TABLET BY MOUTH EVERY NIGHT AT BEDTIME Patient not taking: Reported on 01/13/2020 07/11/16   Dorena Bodo, PA-C  celecoxib (CELEBREX) 100 MG capsule Take 100 mg by mouth 2 (two) times daily. Patient not taking: Reported on 01/13/2020 10/22/19   [provider]  diphenoxylate-atropine (LOMOTIL) 2.5-0.025 MG tablet Take 1 tablet by mouth 4 (four) times daily as needed for diarrhea or loose stools. 06/03/15   Donnetta Hutching, MD  gabapentin (NEURONTIN) 600 MG tablet TAKE 1 TABLET BY MOUTH THREE TIMES DAILY 07/13/15   Allayne Butcher  B, PA-C  INFANTS IBUPROFEN 50 PO Take 200 mg by mouth daily.    [provider]  lisinopril (ZESTRIL) 10 MG tablet Take 10 mg by mouth daily. 12/04/19   [provider]  Menthol, Topical Analgesic, (BENGAY PAIN RELIEF + MASSAGE EX) Apply 1 application topically daily as needed.    [provider]  ondansetron (ZOFRAN) 4 MG tablet Take 1 tablet (4 mg total) by mouth every 6 (six) hours. 06/03/15   Donnetta Hutching, MD  oxyCODONE (ROXICODONE) 15 MG immediate release tablet Take 15 mg by mouth 4 (four) times daily.    [provider]  pantoprazole (PROTONIX) 40 MG tablet TAKE 1 TABLET BY MOUTH EACH DAY Patient taking differently: Take 40 mg by mouth  daily.  12/25/15   Allayne Butcher B, PA-C  pantoprazole (PROTONIX) 40 MG tablet TAKE 1 TABLET BY MOUTH EVERY DAY Patient not taking: Reported on 01/04/2020 05/02/16   Allayne Butcher B, PA-C  pantoprazole (PROTONIX) 40 MG tablet TAKE 1 TABLET BY MOUTH EVERY DAY Patient not taking: Reported on 01/04/2020 05/30/16   Allayne Butcher B, PA-C  pantoprazole (PROTONIX) 40 MG tablet TAKE 1 TABLET BY MOUTH EVERY DAY Patient not taking: Reported on 01/04/2020 06/30/16   Allayne Butcher B, PA-C  pantoprazole (PROTONIX) 40 MG tablet TAKE 1 TABLET BY MOUTH EVERY DAY Patient not taking: Reported on 01/04/2020 10/16/17   Allayne Butcher B, PA-C  potassium chloride SA (KLOR-CON) 20 MEQ tablet Take 20 mEq by mouth 2 (two) times daily. 01/02/20   [provider]  QUEtiapine (SEROQUEL) 200 MG tablet Take 200 mg by mouth at bedtime. 10/22/19   [provider]  rOPINIRole (REQUIP) 3 MG tablet Take 3 mg by mouth at bedtime. 10/23/19   [provider]  tiZANidine (ZANAFLEX) 4 MG tablet TAKE 1 TABLET BY MOUTH EVERY 8 HOURS AS NEEDED 12/03/15   Allayne Butcher B, PA-C  traZODone (DESYREL) 50 MG tablet TAKE 1 TABLET BY MOUTH AT BEDTIME AS NEEDED Patient not taking: Reported on 01/04/2020 07/23/15   Dorena Bodo, PA-C  traZODone (DESYREL) 50 MG tablet TAKE 1 TABLET(50 MG) BY MOUTH AT BEDTIME AS NEEDED Patient taking differently: Take 50 mg by mouth at bedtime as needed.  12/03/15   Allayne Butcher B, PA-C  trolamine salicylate (ASPERCREME) 10 % cream Apply 1 application topically as needed for muscle pain.    [provider]    Allergies    Prozac [fluoxetine hcl], Zoloft [sertraline hcl], and Nicoderm [nicotine]  Review of Systems   Review of Systems  Constitutional: Negative for appetite change and fatigue.  HENT: Negative for congestion, ear discharge and sinus pressure.   Eyes: Negative for discharge.  Respiratory: Negative for cough.   Cardiovascular: Negative for chest pain.  Gastrointestinal: Positive for  abdominal pain. Negative for diarrhea.  Genitourinary: Negative for frequency and hematuria.  Musculoskeletal: Negative for back pain.  Skin: Negative for rash.  Neurological: Negative for seizures and headaches.  Psychiatric/Behavioral: Negative for hallucinations.    Physical Exam Updated Vital Signs BP (!) 146/79   Pulse 82   Temp 97.9 F (36.6 C)   Resp (!) 29   SpO2 95%   Physical Exam Vitals and nursing note reviewed.  Constitutional:      Appearance: She is well-developed.  HENT:     Head: Normocephalic.     Nose: Nose normal.  Eyes:     General: No scleral icterus.    Conjunctiva/sclera: Conjunctivae normal.  Neck:  Thyroid: No thyromegaly.  Cardiovascular:     Rate and Rhythm: Normal rate and regular rhythm.     Heart sounds: No murmur heard.  No friction rub. No gallop.   Pulmonary:     Breath sounds: No stridor. No wheezing or rales.  Chest:     Chest wall: No tenderness.  Abdominal:     General: There is no distension.     Tenderness: There is no abdominal tenderness. There is no rebound.  Musculoskeletal:        General: Normal range of motion.     Cervical back: Neck supple.  Lymphadenopathy:     Cervical: No cervical adenopathy.  Skin:    Findings: No erythema or rash.  Neurological:     Mental Status: She is oriented to person, place, and time.     Motor: No abnormal muscle tone.     Coordination: Coordination normal.  Psychiatric:        Behavior: Behavior normal.     ED Results / Procedures / Treatments   Labs (all labs ordered are listed, but only abnormal results are displayed) Labs Reviewed  CBC WITH DIFFERENTIAL/PLATELET - Abnormal; Notable for the following components:      Result Value   WBC 12.8 (*)    Hemoglobin 11.8 (*)    RDW 16.3 (*)    Platelets 556 (*)    Neutro Abs 10.8 (*)    All other components within normal limits  COMPREHENSIVE METABOLIC PANEL - Abnormal; Notable for the following components:   Sodium 134 (*)     Potassium 3.0 (*)    Chloride 90 (*)    Glucose, Bld 160 (*)    Calcium 8.4 (*)    Anion gap 16 (*)    All other components within normal limits  TROPONIN I (HIGH SENSITIVITY) - Abnormal; Notable for the following components:   Troponin I (High Sensitivity) 23 (*)    All other components within normal limits  RESPIRATORY PANEL BY RT PCR (FLU A&B, COVID)  LIPASE, BLOOD  BRAIN NATRIURETIC PEPTIDE  MAGNESIUM  TROPONIN I (HIGH SENSITIVITY)    EKG EKG Interpretation  Date/Time:  Wednesday January 15 2020 19:21:07 EDT Ventricular Rate:  129 PR Interval:    QRS Duration: 94 QT Interval:  342 QTC Calculation: 501 R Axis:   -11 Text Interpretation: Sinus tachycardia Atrial premature complex Abnormal R-wave progression, late transition Consider left ventricular hypertrophy Nonspecific T abnormalities, lateral leads Prolonged QT interval Confirmed by Bethann BerkshireZammit, Velera Lansdale [phone]) on 01/15/2020 8:50:26 PM   Radiology DG Chest Port 1 View  Result Date: 01/15/2020 CLINICAL DATA:  Abdominal and back pain, weakness EXAM: PORTABLE CHEST 1 VIEW COMPARISON:  01/13/2020 FINDINGS: Single frontal view of the chest demonstrates a stable cardiac silhouette. Chronic scarring throughout the lungs, most pronounced at the left lung base. No airspace disease, effusion, or pneumothorax. No acute bony abnormalities. IMPRESSION: 1. Stable areas of scarring.  No acute airspace disease. Electronically Signed   By: Sharlet SalinaMichael  Brown M.D.   On: 01/15/2020 19:55    Procedures Procedures (including critical care time)  Medications Ordered in ED Medications  potassium chloride 10 mEq in 100 mL IVPB (10 mEq Intravenous New Bag/Given 01/15/20 2105)  potassium chloride 10 mEq in 100 mL IVPB (has no administration in time range)  HYDROmorphone (DILAUDID) injection 1 mg (1 mg Intravenous Given 01/15/20 1940)  LORazepam (ATIVAN) injection 0.5 mg (0.5 mg Intravenous Given 01/15/20 1936)  sodium chloride 0.9 % bolus 1,000 mL (0  mLs Intravenous Stopped 01/15/20 2031)  pantoprazole (PROTONIX) injection 40 mg (40 mg Intravenous Given 01/15/20 1937)  labetalol (NORMODYNE) injection 20 mg (20 mg Intravenous Given 01/15/20 2024)  HYDROmorphone (DILAUDID) injection 0.5 mg (0.5 mg Intravenous Given 01/15/20 2057)  nitroGLYCERIN (NITROGLYN) 2 % ointment 1 inch (1 inch Topical Given 01/15/20 2059)    ED Course  I have reviewed the triage vital signs and the nursing notes.  Pertinent labs & imaging results that were available during my care of the patient were reviewed by me and considered in my medical decision making (see chart for details).    MDM Rules/Calculators/A&P                          Pt with epigastric pain and elevated troponin.  Mild elevated anion gap pt will be admitted to medicine     This patient presents to the ED for concern of abdominal pain, this involves an extensive number of treatment options, and is a complaint that carries with it a high risk of complications and morbidity.  The differential diagnosis includes worsening diverticulitis   Lab Tests:  I Ordered, reviewed, and interpreted labs, which included CBC and chemistries show elevated white count low potassium and mild elevation troponin Medicines ordered:   I ordered medication normal saline for dehydration.  Protonix for gastritis  Imaging Studies ordered:   I ordered imaging studies which included chest x-ray  I independently visualized and interpreted imaging which showed no acute problems  Additional history obtained:   Additional history obtained from records  Previous records obtained and reviewed.  Consultations Obtained:   I consulted hospital and discussed lab and imaging findings  Reevaluation:  After the interventions stated above, I reevaluated the patient and found no change  Critical Interventions:  .    Final Clini patient with acal Impression(s) / ED Diagnoses Final diagnoses:  None    Rx / DC  Orders ED Discharge Orders    None       Bethann Berkshire, MD 01/19/20 [phone]

## 2020-01-15 NOTE — H&P (Addendum)
History and Physical    Brenda Hopkins HYW:[id number] DOB: 10-05-1940 DOA: 01/15/2020  PCP: Shawnie Dapper, PA-C   Patient coming from: Upper abdominal pain  I have personally briefly reviewed patient's old medical records in [facility]  Chief Complaint: Chest and abdominal pain  HPI: Brenda Hopkins is a 79 y.o. female with medical history significant for hypertension, anxiety, chronic pain.  Patient presented to the ED with complaints of persistent abdominal pain started at least 2 weeks ago.  Patient was in the ED 10/23 for same abdominal pain, CTA chest abdomen and pelvis showed multiple acute diverticulitis, and marked severity coronary artery calcification.  Patient was discharged home, with Augmentin, but patient states the same abdominal pain has persisted till today.  Patient was in the ED again, 2 days ago,- 01/13/2020, with some abdominal pain.  CT abdominal pelvis without acute diverticulitis.,  Chronic T9 compression fracture.    Patient today complains of pain in upper abdomen, nonradiating.  No vomiting, no loose stools.  No black stools, no vomiting of blood, no blood in stools.  Patient admits to taking a lot of NSAIDs every day. She also complains of back pain-but this is chronic. Patient complaints also included chest pain that started today, nonradiating, with associated difficulty breathing. She denies prior episodes of chest pain even with activity.  She is unable to quantify her chest pain symptoms further.  Daughter reports poor oral intake over the past few days.  ED Course: Fluctuating O2 sats 89 to 91% on room air, after opioids in the ED O2 sats remained at 88%, and improved to 98% on 2 L.  Heart rate elevated initially in the 130s improved.  Blood pressure elevated systolic up to 198.  Troponin  23.  Lipase 20.  Sodium 134.  Potassium 3.  WBC 12.8.  EKG with artifact but shows sinus rhythm, with PACs.  Without acute space disease.  1.5 mg Dilaudid given,  patient still complaining of pain.  Ativan 0.5 mg given.  Protonix 40 given.  Nitroglycerin ointment.  1 L bolus given. Hospitalist called to admit for intractable pain.   Review of Systems: As per HPI all other systems reviewed and negative.  Past Medical History:  Diagnosis Date  . Chronic back pain   . GERD (gastroesophageal reflux disease)   . Hiatal hernia   . Hyperlipidemia   . Hypertension   . Pelvic fracture (HCC) 12/12/2012  . Smoker     Past Surgical History:  Procedure Laterality Date  . ABDOMINAL HYSTERECTOMY    . APPENDECTOMY    . 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 HEMORROIDECTOMY    . LUMBAR SPINE SURGERY    . OOPHORECTOMY    . TONSILLECTOMY       reports that she has quit smoking. Her smoking use included cigarettes. She smoked 0.50 packs per day. She quit smokeless tobacco use about 7 years ago. She reports that she does not drink alcohol and does not use drugs.  Allergies  Allergen Reactions  . Prozac [Fluoxetine Hcl] Other (See Comments)    Makes her fell crazy  . Zoloft [Sertraline Hcl]     Made her feel crazy--10/2013  . Nicoderm [Nicotine] Rash    Site rash   Family history of hypertension.  Prior to Admission medications   Medication Sig Start Date End Date Taking? Authorizing Provider  ALPRAZolam Prudy Feeler) 0.5 MG tablet Take 1 tablet (0.5 mg total) by mouth 2 (two) times daily as needed. Patient not taking:  Reported on 01/13/2020 03/02/16   Allayne Butcher B, PA-C  amLODipine (NORVASC) 10 MG tablet TAKE 1 TABLET BY MOUTH EVERY DAY 09/28/16   Allayne Butcher B, PA-C  amoxicillin-clavulanate (AUGMENTIN) 875-125 MG tablet Take 1 tablet by mouth every 12 (twelve) hours. Patient not taking: Reported on 01/13/2020 01/04/20   Cathren Laine, MD  atorvastatin (LIPITOR) 20 MG tablet TAKE 1 TABLET BY MOUTH EVERY NIGHT AT BEDTIME Patient not taking: Reported on 01/13/2020 07/11/16   Dorena Bodo, PA-C  celecoxib (CELEBREX) 100 MG capsule Take 100 mg by mouth 2 (two) times  daily. Patient not taking: Reported on 01/13/2020 10/22/19   [provider]  diphenoxylate-atropine (LOMOTIL) 2.5-0.025 MG tablet Take 1 tablet by mouth 4 (four) times daily as needed for diarrhea or loose stools. 06/03/15   Donnetta Hutching, MD  gabapentin (NEURONTIN) 600 MG tablet TAKE 1 TABLET BY MOUTH THREE TIMES DAILY 07/13/15   Dorena Bodo, PA-C  INFANTS IBUPROFEN 50 PO Take 200 mg by mouth daily.    [provider]  lisinopril (ZESTRIL) 10 MG tablet Take 10 mg by mouth daily. 12/04/19   [provider]  Menthol, Topical Analgesic, (BENGAY PAIN RELIEF + MASSAGE EX) Apply 1 application topically daily as needed.    [provider]  ondansetron (ZOFRAN) 4 MG tablet Take 1 tablet (4 mg total) by mouth every 6 (six) hours. 06/03/15   Donnetta Hutching, MD  oxyCODONE (ROXICODONE) 15 MG immediate release tablet Take 15 mg by mouth 4 (four) times daily.    [provider]  pantoprazole (PROTONIX) 40 MG tablet TAKE 1 TABLET BY MOUTH EACH DAY Patient taking differently: Take 40 mg by mouth daily.  12/25/15   Allayne Butcher B, PA-C  pantoprazole (PROTONIX) 40 MG tablet TAKE 1 TABLET BY MOUTH EVERY DAY Patient not taking: Reported on 01/04/2020 05/02/16   Allayne Butcher B, PA-C  pantoprazole (PROTONIX) 40 MG tablet TAKE 1 TABLET BY MOUTH EVERY DAY Patient not taking: Reported on 01/04/2020 05/30/16   Allayne Butcher B, PA-C  pantoprazole (PROTONIX) 40 MG tablet TAKE 1 TABLET BY MOUTH EVERY DAY Patient not taking: Reported on 01/04/2020 06/30/16   Allayne Butcher B, PA-C  pantoprazole (PROTONIX) 40 MG tablet TAKE 1 TABLET BY MOUTH EVERY DAY Patient not taking: Reported on 01/04/2020 10/16/17   Allayne Butcher B, PA-C  potassium chloride SA (KLOR-CON) 20 MEQ tablet Take 20 mEq by mouth 2 (two) times daily. 01/02/20   [provider]  QUEtiapine (SEROQUEL) 200 MG tablet Take 200 mg by mouth at bedtime. 10/22/19   [provider]  rOPINIRole (REQUIP) 3 MG tablet Take 3 mg by mouth  at bedtime. 10/23/19   [provider]  tiZANidine (ZANAFLEX) 4 MG tablet TAKE 1 TABLET BY MOUTH EVERY 8 HOURS AS NEEDED 12/03/15   Allayne Butcher B, PA-C  traZODone (DESYREL) 50 MG tablet TAKE 1 TABLET BY MOUTH AT BEDTIME AS NEEDED Patient not taking: Reported on 01/04/2020 07/23/15   Dorena Bodo, PA-C  traZODone (DESYREL) 50 MG tablet TAKE 1 TABLET(50 MG) BY MOUTH AT BEDTIME AS NEEDED Patient taking differently: Take 50 mg by mouth at bedtime as needed.  12/03/15   Allayne Butcher B, PA-C  trolamine salicylate (ASPERCREME) 10 % cream Apply 1 application topically as needed for muscle pain.    [provider]    Physical Exam: Vitals:   01/15/20 1949 01/15/20 2000 01/15/20 2004 01/15/20 2030  BP:  (S) (!) 198/134 (!) 182/121 (!) 146/79  Pulse:  Marland Kitchen)  54 60 82  Resp:  18 (!) 25 (!) 29  Temp:      SpO2: (S) (!) 89% 98% 96% 95%    Constitutional: During my evaluation patient became very anxious, yelling that she wanted to get up, that she was in pain-abdominal pain, almost jumping off the bed.  When I got out of room, patient calmed down significantly and appeared to be dropping off to sleep intermittently while sitting upright with legs hanging off the end of the bed.  Vitals:   01/15/20 1949 01/15/20 2000 01/15/20 2004 01/15/20 2030  BP:  (S) (!) 198/134 (!) 182/121 (!) 146/79  Pulse:  (!) 54 60 82  Resp:  18 (!) 25 (!) 29  Temp:      SpO2: (S) (!) 89% 98% 96% 95%   Eyes: PERRL, lids and conjunctivae normal ENMT: Mucous membranes are moist.  Neck: normal, supple, no masses, no thyromegaly Respiratory:  Faint Scattered expiratory wheezes, no crackles. Normal respiratory effort. No accessory muscle use.  Cardiovascular: Regular rate and rhythm, no murmurs / rubs / gallops. No extremity edema. 2+ pedal pulses.  Abdomen: no appreciable tenderness, no masses palpated. No hepatosplenomegaly.  Musculoskeletal: no clubbing / cyanosis. No joint deformity upper and lower extremities.  Good ROM, no contractures. Normal muscle tone.  Skin: Ecchymotic areas to upper arms, no  ulcers. No induration Neurologic: No apparent cranial abnormality, moving extremities spontaneously. Psychiatric: Patient extremely anxious, alert oriented x3.  Labs on Admission: I have personally reviewed following labs and imaging studies  CBC: Recent Labs  Lab 01/13/20 1301 01/15/20 1928  WBC 9.9 12.8*  NEUTROABS 8.0* 10.8*  HGB 10.2* 11.8*  HCT 35.0* 37.9  MCV 91.9 86.5  PLT 505* 556*   Basic Metabolic Panel: Recent Labs  Lab 01/13/20 1301 01/15/20 1928  NA 135 134*  K 4.1 3.0*  CL 104 90*  CO2 26 28  GLUCOSE 142* 160*  BUN 17 12  CREATININE 0.77 0.89  CALCIUM 8.3* 8.4*   Liver Function Tests: Recent Labs  Lab 01/13/20 1301 01/15/20 1928  AST 13* 20  ALT 13 15  ALKPHOS 78 93  BILITOT 0.2* 0.7  PROT 6.4* 7.7  ALBUMIN 3.0* 3.6   Recent Labs  Lab 01/13/20 1301 01/15/20 1928  LIPASE 16 20    Radiological Exams on Admission: DG Chest Port 1 View  Result Date: 01/15/2020 CLINICAL DATA:  Abdominal and back pain, weakness EXAM: PORTABLE CHEST 1 VIEW COMPARISON:  01/13/2020 FINDINGS: Single frontal view of the chest demonstrates a stable cardiac silhouette. Chronic scarring throughout the lungs, most pronounced at the left lung base. No airspace disease, effusion, or pneumothorax. No acute bony abnormalities. IMPRESSION: 1. Stable areas of scarring.  No acute airspace disease. Electronically Signed   By: Sharlet Salina M.D.   On: 01/15/2020 19:55    EKG: Independently reviewed.  Sinus rhythm rate 86, QTc 527.  PACs present.  Artifacts present.  Repeat EKG  Assessment/Plan Principal Problem:   Intractable epigastric abdominal pain Active Problems:   Hypertension   Chronic back pain   Hiatal hernia   Smoker   Generalized anxiety disorder  Intractable epigastric pain-likely peptic ulcer disease , gastritis.  Patient takes a lot of NSAIDs every day for chronic  pain-ibuprofen, celecoxib.  Hemoglobin stable over the past 11 days ~ 11.  Denies GI blood loss at this time.  Abdominal CT without acute abnormality.  Previous diverticulitis seen 10/23 appears to have resolved. -IV Protonix 40 twice daily -Sucralfate 1  g x 1 dose -Resume home pain medications oxycodone 15 mg every 6 hourly. -CBC tomorrow  Chest pain-troponin 23, EKG without significant abnormalities.  CTA chest 10/23 shows marked severity coronary artery calcification. -Trend troponin -Obtain echocardiogram -EKG in a.m -Resume atorvastatin 20 mg daily  Generalized anxiety disorder-large anxiety component to patient's presentation today.  Daughter at bedside reports anxiety disorder at home.  Patient outpatient provider not prescribing Xanax, because patient is on opioids. -IV Ativan 0.5 every 8 hourly as needed -Would benefit from starting antianxiety medications, but QTc quite prolonged today at 527. -Resume Seroquel nightly  Chronic back pain -Resume home oxycodone, gabapentin  Acute respiratory failure -mild, O2 sats 89 to 92%, likely patient has COPD.  History of tobacco abuse.  Faint scattered expiratory wheezing.  Hypoxia worsened by narcotics given in ED. -DuoNebs every 6 hourly x2, PRN -Cautious with narcotics  Hypokalemia-potassium 3.  On chronic potassium supplementation -Check mag - Replete K -BMP tomorrow  Hypertension-uncontrolled.  Systolic up to 190s requiring 20 mg of labetalol.  -Resume lisinopril, Norvasc. -As needed labetalol   DVT prophylaxis: SCDs for now, with concern for PUD. Code Status: Full code Family Communication: Daughter coming at bedside. Disposition Plan: ~ 1 - 2 days Consults called: None. Admission status: Observation, telemetry.  Onnie BoerEjiroghene E Catherina Pates MD [facility]  01/15/2020, 10:20 PM

## 2020-01-16 ENCOUNTER — Observation Stay (HOSPITAL_BASED_OUTPATIENT_CLINIC_OR_DEPARTMENT_OTHER): Payer: Medicare Other

## 2020-01-16 ENCOUNTER — Encounter: Payer: Self-pay | Admitting: Internal Medicine

## 2020-01-16 DIAGNOSIS — K2100 Gastro-esophageal reflux disease with esophagitis, without bleeding: Secondary | ICD-10-CM | POA: Diagnosis present

## 2020-01-16 DIAGNOSIS — K439 Ventral hernia without obstruction or gangrene: Secondary | ICD-10-CM

## 2020-01-16 DIAGNOSIS — G8929 Other chronic pain: Secondary | ICD-10-CM | POA: Diagnosis present

## 2020-01-16 DIAGNOSIS — F17210 Nicotine dependence, cigarettes, uncomplicated: Secondary | ICD-10-CM | POA: Diagnosis present

## 2020-01-16 DIAGNOSIS — F411 Generalized anxiety disorder: Secondary | ICD-10-CM | POA: Diagnosis present

## 2020-01-16 DIAGNOSIS — Z79891 Long term (current) use of opiate analgesic: Secondary | ICD-10-CM | POA: Diagnosis not present

## 2020-01-16 DIAGNOSIS — E785 Hyperlipidemia, unspecified: Secondary | ICD-10-CM | POA: Diagnosis present

## 2020-01-16 DIAGNOSIS — Z980 Intestinal bypass and anastomosis status: Secondary | ICD-10-CM | POA: Diagnosis not present

## 2020-01-16 DIAGNOSIS — Z9884 Bariatric surgery status: Secondary | ICD-10-CM | POA: Diagnosis not present

## 2020-01-16 DIAGNOSIS — R1013 Epigastric pain: Secondary | ICD-10-CM | POA: Diagnosis present

## 2020-01-16 DIAGNOSIS — Z20822 Contact with and (suspected) exposure to covid-19: Secondary | ICD-10-CM | POA: Diagnosis present

## 2020-01-16 DIAGNOSIS — J9601 Acute respiratory failure with hypoxia: Secondary | ICD-10-CM | POA: Diagnosis present

## 2020-01-16 DIAGNOSIS — R34 Anuria and oliguria: Secondary | ICD-10-CM | POA: Diagnosis present

## 2020-01-16 DIAGNOSIS — D649 Anemia, unspecified: Secondary | ICD-10-CM | POA: Diagnosis not present

## 2020-01-16 DIAGNOSIS — N179 Acute kidney failure, unspecified: Secondary | ICD-10-CM | POA: Diagnosis present

## 2020-01-16 DIAGNOSIS — E876 Hypokalemia: Secondary | ICD-10-CM | POA: Diagnosis present

## 2020-01-16 DIAGNOSIS — Z888 Allergy status to other drugs, medicaments and biological substances status: Secondary | ICD-10-CM | POA: Diagnosis not present

## 2020-01-16 DIAGNOSIS — J449 Chronic obstructive pulmonary disease, unspecified: Secondary | ICD-10-CM | POA: Diagnosis present

## 2020-01-16 DIAGNOSIS — R079 Chest pain, unspecified: Secondary | ICD-10-CM | POA: Diagnosis not present

## 2020-01-16 DIAGNOSIS — K449 Diaphragmatic hernia without obstruction or gangrene: Secondary | ICD-10-CM | POA: Diagnosis present

## 2020-01-16 DIAGNOSIS — M549 Dorsalgia, unspecified: Secondary | ICD-10-CM | POA: Diagnosis present

## 2020-01-16 DIAGNOSIS — I10 Essential (primary) hypertension: Secondary | ICD-10-CM | POA: Diagnosis present

## 2020-01-16 DIAGNOSIS — Z79899 Other long term (current) drug therapy: Secondary | ICD-10-CM | POA: Diagnosis not present

## 2020-01-16 DIAGNOSIS — G4700 Insomnia, unspecified: Secondary | ICD-10-CM | POA: Diagnosis present

## 2020-01-16 DIAGNOSIS — G929 Unspecified toxic encephalopathy: Secondary | ICD-10-CM | POA: Diagnosis present

## 2020-01-16 DIAGNOSIS — D509 Iron deficiency anemia, unspecified: Secondary | ICD-10-CM | POA: Diagnosis not present

## 2020-01-16 DIAGNOSIS — Z9049 Acquired absence of other specified parts of digestive tract: Secondary | ICD-10-CM | POA: Diagnosis not present

## 2020-01-16 DIAGNOSIS — Z90710 Acquired absence of both cervix and uterus: Secondary | ICD-10-CM | POA: Diagnosis not present

## 2020-01-16 LAB — CBC
HCT: 30.3 % — ABNORMAL LOW (ref 36.0–46.0)
Hemoglobin: 8.9 g/dL — ABNORMAL LOW (ref 12.0–15.0)
MCH: 26.3 pg (ref 26.0–34.0)
MCHC: 29.4 g/dL — ABNORMAL LOW (ref 30.0–36.0)
MCV: 89.6 fL (ref 80.0–100.0)
Platelets: 393 10*3/uL (ref 150–400)
RBC: 3.38 MIL/uL — ABNORMAL LOW (ref 3.87–5.11)
RDW: 16.5 % — ABNORMAL HIGH (ref 11.5–15.5)
WBC: 8.8 10*3/uL (ref 4.0–10.5)
nRBC: 0 % (ref 0.0–0.2)

## 2020-01-16 LAB — BASIC METABOLIC PANEL
Anion gap: 10 (ref 5–15)
BUN: 16 mg/dL (ref 8–23)
CO2: 31 mmol/L (ref 22–32)
Calcium: 7.8 mg/dL — ABNORMAL LOW (ref 8.9–10.3)
Chloride: 97 mmol/L — ABNORMAL LOW (ref 98–111)
Creatinine, Ser: 1.22 mg/dL — ABNORMAL HIGH (ref 0.44–1.00)
GFR, Estimated: 46 mL/min — ABNORMAL LOW (ref >60)
Glucose, Bld: 121 mg/dL — ABNORMAL HIGH (ref 70–99)
Potassium: 3.2 mmol/L — ABNORMAL LOW (ref 3.5–5.1)
Sodium: 138 mmol/L (ref 135–145)

## 2020-01-16 LAB — ECHOCARDIOGRAM COMPLETE
AR max vel: 2.43 cm2
AV Area VTI: 2.58 cm2
AV Area mean vel: 2.01 cm2
AV Mean grad: 7 mmHg
AV Peak grad: 12.7 mmHg
Ao pk vel: 1.78 m/s
Area-P 1/2: 2.33 cm2
Height: 61 in
S' Lateral: 2.66 cm
Weight: 2606.72 oz

## 2020-01-16 LAB — IRON AND TIBC
Iron: 19 ug/dL — ABNORMAL LOW (ref 28–170)
Saturation Ratios: 7 % — ABNORMAL LOW (ref 10.4–31.8)
TIBC: 292 ug/dL (ref 250–450)
UIBC: 273 ug/dL

## 2020-01-16 LAB — RETICULOCYTES
Immature Retic Fract: 17.4 % — ABNORMAL HIGH (ref 2.3–15.9)
RBC.: 3.29 MIL/uL — ABNORMAL LOW (ref 3.87–5.11)
Retic Count, Absolute: 48.7 10*3/uL (ref 19.0–186.0)
Retic Ct Pct: 1.5 % (ref 0.4–3.1)

## 2020-01-16 LAB — VITAMIN B12: Vitamin B-12: 762 pg/mL (ref 180–914)

## 2020-01-16 LAB — FOLATE: Folate: 28.8 ng/mL (ref >5.9)

## 2020-01-16 LAB — FERRITIN: Ferritin: 13 ng/mL (ref 11–307)

## 2020-01-16 MED ORDER — POTASSIUM CHLORIDE IN NACL 20-0.45 MEQ/L-% IV SOLN: INTRAVENOUS | Status: AC

## 2020-01-16 MED ORDER — MAGNESIUM SULFATE 2 GM/50ML IV SOLN
2.0000 g | Freq: Once | INTRAVENOUS | Status: AC
  Administered 2020-01-16: 2 g via INTRAVENOUS
  Filled 2020-01-16: qty 50

## 2020-01-16 MED ORDER — IPRATROPIUM-ALBUTEROL 0.5-2.5 (3) MG/3ML IN SOLN: 3.0000 mL | Freq: Four times a day (QID) | RESPIRATORY_TRACT | Status: DC | PRN

## 2020-01-16 NOTE — Progress Notes (Signed)
*  PRELIMINARY RESULTS* Echocardiogram 2D Echocardiogram has been performed.  Brenda Hopkins 01/16/2020, 9:41 AM

## 2020-01-16 NOTE — Progress Notes (Signed)
PROGRESS NOTE    Brenda Hopkins  WUJ:[id number] DOB: 10/30/1940 DOA: 01/15/2020 PCP: Shawnie Dapper, PA-C   Brief Narrative:  Per HPI: Brenda Hopkins is a 79 y.o. female with medical history significant for hypertension, anxiety, chronic pain.  Patient presented to the ED with complaints of persistent abdominal pain started at least 2 weeks ago.  Patient was in the ED 10/23 for same abdominal pain, CTA chest abdomen and pelvis showed multiple acute diverticulitis, and marked severity coronary artery calcification.  Patient was discharged home, with Augmentin, but patient states the same abdominal pain has persisted till today.  Patient was in the ED again, 2 days ago,- 01/13/2020, with some abdominal pain.  CT abdominal pelvis without acute diverticulitis.,  Chronic T9 compression fracture.    Patient today complains of pain in upper abdomen, nonradiating.  No vomiting, no loose stools.  No black stools, no vomiting of blood, no blood in stools.  Patient admits to taking a lot of NSAIDs every day. She also complains of back pain-but this is chronic. Patient complaints also included chest pain that started today, nonradiating, with associated difficulty breathing. She denies prior episodes of chest pain even with activity.  She is unable to quantify her chest pain symptoms further.  Daughter reports poor oral intake over the past few days.  11/4: Patient was admitted with intractable epigastric abdominal pain in the setting of frequent NSAID use and is noted to have generalized anxiety disorder as well.  She has been seen by GI with plans for EGD in a.m. to evaluate for gastritis or peptic ulcer disease.  Assessment & Plan:   Principal Problem:   Intractable epigastric abdominal pain Active Problems:   Hypertension   Chronic back pain   Hiatal hernia   Smoker   Generalized anxiety disorder   Epigastric pain   Ventral hernia without obstruction or gangrene  Intractable epigastric  abdominal pain-possibly multifactorial -Appreciate GI evaluation for EGD in a.m. to rule out gastritis/peptic ulcer disease given NSAID use -May have ventral hernia as part of the problem along with hydropic gallbladder, but no signs of cholecystitis -Clear liquid diet for now -PPI IV twice daily -Continue home pain medications and will follow a.m. labs  Acute hypoxemic respiratory failure -Likely multifactorial with narcotic use in the setting of COPD -DuoNebs as needed -Continue nasal cannula supplementation and wean prior to discharge, otherwise may need home oxygen  Anemia -Noted to have hemoglobin in the 11-12 range and is down to 8.9 today -No prior endoscopy noted and may have had colonoscopy in 2010 -Plan to check anemia panel  Chest pain -Troponin with flat trend -2D echocardiogram with no significant findings LVEF 60-65% and diastolic dysfunction grade 1 -CT chest on 10/23 with marked severe coronary artery calcification noted -Continue statin  Generalized anxiety disorder -PCP not prescribing Xanax because patient on opioids -IV Ativan 0.5 mg every 8 hours as needed -May require some antianxiety medications outpatient, will refer to PCP -Continue Seroquel at bedtime  Chronic back pain -Continue oxycodone and gabapentin  Hypokalemia -Continue supplementation IV -Recheck labs and magnesium in a.m.  Hypertension-stable -Continue lisinopril and Norvasc -Labetalol as needed   DVT prophylaxis: SCDs Code Status: Full code Family Communication: None at bedside Disposition Plan:  Status is: Inpatient  Remains inpatient appropriate because:IV treatments appropriate due to intensity of illness or inability to take PO and Inpatient level of care appropriate due to severity of illness   Dispo: The patient is from: Home  Anticipated d/c is to: Home              Anticipated d/c date is: 2 days              Patient currently is not medically stable to  d/c.  Patient requires EGD for further evaluation  Consultants:   GI  Procedures:   See below  Antimicrobials:  Anti-infectives (From admission, onward)   None       Subjective: Patient seen and evaluated today and is quite somnolent.  She appears to deny any significant abdominal pain or nausea.  No acute events noted since admission.  Objective: Vitals:   01/15/20 2338 01/16/20 0146 01/16/20 0524 01/16/20 1250  BP:  (!) 98/54 112/66 126/69  Pulse:  91 84 89  Resp:  19 18 18   Temp:  98 F (36.7 C) 97.9 F (36.6 C) 98 F (36.7 C)  TempSrc:  Oral Oral Oral  SpO2:  94% 96% 93%  Weight: 73.9 kg     Height: 5\' 1"  (1.549 m)       Intake/Output Summary (Last 24 hours) at 01/16/2020 1500 Last data filed at 01/16/2020 1230 Gross per 24 hour  Intake 1150 ml  Output --  Net 1150 ml   Filed Weights   01/15/20 2338  Weight: 73.9 kg    Examination:  General exam: Appears calm and comfortable, somnolent Respiratory system: Clear to auscultation. Respiratory effort normal.  On 2.5 L nasal cannula oxygen Cardiovascular system: S1 & S2 heard, RRR.  Gastrointestinal system: Abdomen with tenderness over ventral wall hernia Central nervous system: Somnolent but arousable Extremities: No edema Skin: No rashes, lesions or ulcers Psychiatry: Flat affect    Data Reviewed: I have personally reviewed following labs and imaging studies  CBC: Recent Labs  Lab 01/13/20 1301 01/15/20 1928 01/16/20 0501  WBC 9.9 12.8* 8.8  NEUTROABS 8.0* 10.8*  --   HGB 10.2* 11.8* 8.9*  HCT 35.0* 37.9 30.3*  MCV 91.9 86.5 89.6  PLT 505* 556* 393   Basic Metabolic Panel: Recent Labs  Lab 01/13/20 1301 01/15/20 1928 01/15/20 2127 01/16/20 0501  NA 135 134*  --  138  K 4.1 3.0*  --  3.2*  CL 104 90*  --  97*  CO2 26 28  --  31  GLUCOSE 142* 160*  --  121*  BUN 17 12  --  16  CREATININE 0.77 0.89  --  1.22*  CALCIUM 8.3* 8.4*  --  7.8*  MG  --   --  1.5*  --    GFR: Estimated  Creatinine Clearance: 35.5 mL/min (A) (by C-G formula based on SCr of 1.22 mg/dL (H)). Liver Function Tests: Recent Labs  Lab 01/13/20 1301 01/15/20 1928  AST 13* 20  ALT 13 15  ALKPHOS 78 93  BILITOT 0.2* 0.7  PROT 6.4* 7.7  ALBUMIN 3.0* 3.6   Recent Labs  Lab 01/13/20 1301 01/15/20 1928  LIPASE 16 20   No results for input(s): AMMONIA in the last 168 hours. Coagulation Profile: No results for input(s): INR, PROTIME in the last 168 hours. Cardiac Enzymes: No results for input(s): CKTOTAL, CKMB, CKMBINDEX, TROPONINI in the last 168 hours. BNP (last 3 results) No results for input(s): PROBNP in the last 8760 hours. HbA1C: No results for input(s): HGBA1C in the last 72 hours. CBG: No results for input(s): GLUCAP in the last 168 hours. Lipid Profile: No results for input(s): CHOL, HDL, LDLCALC, TRIG, CHOLHDL, LDLDIRECT in the last  72 hours. Thyroid Function Tests: No results for input(s): TSH, T4TOTAL, FREET4, T3FREE, THYROIDAB in the last 72 hours. Anemia Panel: No results for input(s): VITAMINB12, FOLATE, FERRITIN, TIBC, IRON, RETICCTPCT in the last 72 hours. Sepsis Labs: Recent Labs  Lab 01/13/20 1301 01/13/20 1439  LATICACIDVEN 0.5 0.9    Recent Results (from the past 240 hour(s))  Respiratory Panel by RT PCR (Flu A&B, Covid) - Nasopharyngeal Swab     Status: None   Collection Time: 01/15/20  9:18 PM   Specimen: Nasopharyngeal Swab  Result Value Ref Range Status   SARS Coronavirus 2 by RT PCR NEGATIVE NEGATIVE Final    Comment: (NOTE) SARS-CoV-2 target nucleic acids are NOT DETECTED.  The SARS-CoV-2 RNA is generally detectable in upper respiratoy specimens during the acute phase of infection. The lowest concentration of SARS-CoV-2 viral copies this assay can detect is 131 copies/mL. A negative result does not preclude SARS-Cov-2 infection and should not be used as the sole basis for treatment or other patient management decisions. A negative result may occur  with  improper specimen collection/handling, submission of specimen other than nasopharyngeal swab, presence of viral mutation(s) within the areas targeted by this assay, and inadequate number of viral copies (<131 copies/mL). A negative result must be combined with clinical observations, patient history, and epidemiological information. The expected result is Negative.  Fact Sheet for Patients:  https://www.moore.com/https://www.fda.gov/media/142436/download  Fact Sheet for Healthcare Providers:  https://www.young.biz/https://www.fda.gov/media/142435/download  This test is no t yet approved or cleared by the [place] FDA and  has been authorized for detection and/or diagnosis of SARS-CoV-2 by FDA under an Emergency Use Authorization (EUA). This EUA will remain  in effect (meaning this test can be used) for the duration of the COVID-19 declaration under Section 564(b)(1) of the Act, 21 U.S.C. section 360bbb-3(b)(1), unless the authorization is terminated or revoked sooner.     Influenza A by PCR NEGATIVE NEGATIVE Final   Influenza B by PCR NEGATIVE NEGATIVE Final    Comment: (NOTE) The Xpert Xpress SARS-CoV-2/FLU/RSV assay is intended as an aid in  the diagnosis of influenza from Nasopharyngeal swab specimens and  should not be used as a sole basis for treatment. Nasal washings and  aspirates are unacceptable for Xpert Xpress SARS-CoV-2/FLU/RSV  testing.  Fact Sheet for Patients: https://www.moore.com/https://www.fda.gov/media/142436/download  Fact Sheet for Healthcare Providers: https://www.young.biz/https://www.fda.gov/media/142435/download  This test is not yet approved or cleared by the [place] FDA and  has been authorized for detection and/or diagnosis of SARS-CoV-2 by  FDA under an Emergency Use Authorization (EUA). This EUA will remain  in effect (meaning this test can be used) for the duration of the  Covid-19 declaration under Section 564(b)(1) of the Act, 21  U.S.C. section 360bbb-3(b)(1), unless the authorization is  terminated or  revoked. Performed at [facility], [address] [place], [place] [address]          [facility]  Result Date: 01/15/2020 CLINICAL DATA:  Abdominal and back pain, weakness EXAM: PORTABLE CHEST 1 VIEW COMPARISON:  01/13/2020 FINDINGS: Single frontal view of the chest demonstrates a stable cardiac silhouette. Chronic scarring throughout the lungs, most pronounced at the left lung base. No airspace disease, effusion, or pneumothorax. No acute bony abnormalities. IMPRESSION: 1. Stable areas of scarring.  No acute airspace disease. Electronically Signed   By: Sharlet SalinaMichael  Brown M.D.   On: 01/15/2020 19:55   ECHOCARDIOGRAM COMPLETE  Result Date: 01/16/2020    ECHOCARDIOGRAM REPORT   Patient Name:  Tanganika Marca Ancona Date of Exam: 01/16/2020 Medical Rec #:  [id number]      Height:       61.0 in Accession #:    [id number]     Weight:       162.9 lb Date of Birth:  Nov 01, 1940      BSA:          1.731 m Patient Age:    79 years       BP:           112/66 mmHg Patient Gender: F              HR:           77 bpm. [facility] Procedure: 2D Echo Indications:    Chest Pain 786.50 / R07.9  History:        Patient has no prior history of Echocardiogram examinations.                 Signs/Symptoms:Chest Pain; Risk Factors:Hypertension, Former                 Smoker and Dyslipidemia. GERD.  Sonographer:    Jeryl Columbia RDCS (AE) Referring Phys: [id number] Heloise Beecham [facility] IMPRESSIONS  1. Left ventricular ejection fraction, by estimation, is 60 to 65%. The left ventricle has normal function. The left ventricle has no regional wall motion abnormalities. There is moderate left ventricular hypertrophy. Left ventricular diastolic parameters are consistent with Grade I diastolic dysfunction (impaired relaxation).  2. Right ventricular systolic function is normal. The right ventricular size is normal.  3. The mitral valve is normal in structure. No evidence of mitral valve regurgitation.  No evidence of mitral stenosis.  4. The aortic valve is tricuspid. There is mild calcification of the aortic valve. There is mild thickening of the aortic valve. Aortic valve regurgitation is not visualized. No aortic stenosis is present.  5. The inferior vena cava is normal in size with greater than 50% respiratory variability, suggesting right atrial pressure of 3 mmHg. FINDINGS  Left Ventricle: Left ventricular ejection fraction, by estimation, is 60 to 65%. The left ventricle has normal function. The left ventricle has no regional wall motion abnormalities. The left ventricular internal cavity size was normal in size. There is  moderate left ventricular hypertrophy. Left ventricular diastolic parameters are consistent with Grade I diastolic dysfunction (impaired relaxation). Normal left ventricular filling pressure. Right Ventricle: The right ventricular size is normal. No increase in right ventricular wall thickness. Right ventricular systolic function is normal. Left Atrium: Left atrial size was normal in size. Right Atrium: Right atrial size was normal in size. Pericardium: There is no evidence of pericardial effusion. Mitral Valve: The mitral valve is normal in structure. There is mild thickening of the mitral valve leaflet(s). There is mild calcification of the mitral valve leaflet(s). Mild mitral annular calcification. No evidence of mitral valve regurgitation. No evidence of mitral valve stenosis. Tricuspid Valve: The tricuspid valve is normal in structure. Tricuspid valve regurgitation is trivial. No evidence of tricuspid stenosis. Aortic Valve: The aortic valve is tricuspid. There is mild calcification of the aortic valve. There is mild thickening of the aortic valve. There is mild aortic valve annular calcification. Aortic valve regurgitation is not visualized. No aortic stenosis  is present. Aortic valve mean gradient measures 7.0 mmHg. Aortic valve peak gradient measures 12.7 mmHg. Aortic valve area,  by VTI measures 2.58 cm. Pulmonic Valve: The pulmonic valve was not well visualized. Pulmonic valve regurgitation  is not visualized. No evidence of pulmonic stenosis. Aorta: The aortic root is normal in size and structure. Pulmonary Artery: Indeterminant PASP, inadequate TR jet. Venous: The inferior vena cava is normal in size with greater than 50% respiratory variability, suggesting right atrial pressure of 3 mmHg. IAS/Shunts: No atrial level shunt detected by color flow Doppler.  LEFT VENTRICLE PLAX 2D LVIDd:         4.84 cm  Diastology LVIDs:         2.66 cm  LV e' medial:    10.80 cm/s LV PW:         1.20 cm  LV E/e' medial:  7.3 LV IVS:        1.24 cm  LV e' lateral:   5.98 cm/s LVOT diam:     2.20 cm  LV E/e' lateral: 13.1 LV SV:         93 LV SV Index:   54 LVOT Area:     3.80 cm  RIGHT VENTRICLE RV S prime:     16.30 cm/s TAPSE (M-mode): 1.8 cm LEFT ATRIUM             Index       RIGHT ATRIUM           Index LA diam:        5.00 cm 2.89 cm/m  RA Area:     12.70 cm LA Vol (A2C):   53.4 ml 30.85 ml/m RA Volume:   35.50 ml  20.51 ml/m LA Vol (A4C):   49.3 ml 28.48 ml/m LA Biplane Vol: 51.9 ml 29.98 ml/m  AORTIC VALVE AV Area (Vmax):    2.43 cm AV Area (Vmean):   2.01 cm AV Area (VTI):     2.58 cm AV Vmax:           178.41 cm/s AV Vmean:          126.491 cm/s AV VTI:            0.361 m AV Peak Grad:      12.7 mmHg AV Mean Grad:      7.0 mmHg LVOT Vmax:         113.97 cm/s LVOT Vmean:        66.793 cm/s LVOT VTI:          0.245 m LVOT/AV VTI ratio: 0.68  AORTA Ao Root diam: 2.60 cm MITRAL VALVE                TRICUSPID VALVE MV Area (PHT): 2.33 cm     TR Peak grad:   28.1 mmHg MV Decel Time: 326 msec     TR Vmax:        265.00 cm/s MV E velocity: 78.40 cm/s MV A velocity: 124.00 cm/s  SHUNTS MV E/A ratio:  0.63         Systemic VTI:  0.24 m                             Systemic Diam: 2.20 cm Dina Rich MD Electronically signed by Dina Rich MD Signature Date/Time: 01/16/2020/12:37:20 PM     Final         Scheduled Meds: . amLODipine  10 mg Oral Daily  . atorvastatin  20 mg Oral QHS  . gabapentin  600 mg Oral TID  . lisinopril  10 mg Oral Daily  . oxyCODONE  15 mg Oral QID  . pantoprazole (PROTONIX)  IV  40 mg Intravenous Q12H  . potassium chloride  40 mEq Oral BID  . QUEtiapine  200 mg Oral QHS  . rOPINIRole  3 mg Oral QHS   Continuous Infusions: . 0.45 % NaCl with KCl 20 mEq / L 75 mL/hr at 01/16/20 0810     LOS: 0 days    Time spent: 35 minutes    Taylah Dubiel Hoover Brunette, DO [facility]  If 7PM-7AM, please contact night-coverage [email or web address] 01/16/2020, 3:00 PM

## 2020-01-16 NOTE — Consult Note (Signed)
Referring Provider: Rodena Goldmann, DO Primary Care Physician:  Cory Munch, PA-C Primary Gastroenterologist:    Reason for Consultation:  Epigastric pain, ?need for endoscopy  HPI: Brenda Hopkins is a 79 y.o. female with past medical history significant for hypertension, anxiety, chronic back pain, remote gastric bypass who presented to the ED with complaints of persistent abdominal pain which started a couple of weeks ago.    Patient was seen in the ED October 23 for abdominal/chest pain.  CTA chest/abdomen/pelvis showed marked severity coronary artery calcification, gallbladder moderately distended without evidence of stones or gallbladder wall thickening, questionable mild acute sigmoid diverticulitis.  She was discharged home on Augmentin.   Presented back to the ED November 1 with same symptoms.  Repeat CT abdomen pelvis with contrast showed nonspecific hydropic gallbladder measuring up to 6 cm without gallbladder wall thickening or pericholecystic fluid.  No gallstones.  Roux-en-Y gastric bypass.  Moderate sized fat-containing supraumbilical ventral wall hernia with an abdominal defect of 1.2 cm.  No acute diverticulitis.  Patient was agitated during ED evaluation and ultimately requested for discharge from the ED.  Patient returned to the ED yesterday with ongoing abdominal pain.  Daughter reports patient had poor oral intake over the past few days.  Complaining of upper abdominal pain without radiation. Worse with meals. No vomiting.  No blood in the stool or melena.  No loose stools.  Patient admits to taking NSAIDs every day for chronic back pain.  Taking ibuprofen and ?Celebrex. Take a heartburn pill three times a day, unsure name. Reported chest pain, troponin 23 X 2.  EKG without significant abnormalities. Feels hungry.   In the ED she is noted to have blood pressure was elevated systolic up to 016.  Heart rate elevated initially in the 130s.  O2 sats in the 89 to 91% range.   Improved to 98% on 2 L.  Lipase 20.  Sodium 134 potassium 3.  White blood cell count 12,800.  Hemoglobin 11.8, hematocrit 37.9, platelets 556,000, sodium 134, potassium 3, BUN 12, creatinine 0.89, total bilirubin 0.7, alk phos 93, AST 20, ALT 15, troponin 23, lipase 20, magnesium 1.5.  Today her hemoglobin is 8.9, hematocrit 30.8, white blood cell count 8800, potassium 3.2, creatinine 1.22, BUN 16   Prior to Admission medications   Medication Sig Start Date End Date Taking? Authorizing Provider  amLODipine (NORVASC) 10 MG tablet TAKE 1 TABLET BY MOUTH EVERY DAY 09/28/16  Yes Dena Billet B, PA-C  amoxicillin-clavulanate (AUGMENTIN) 875-125 MG tablet Take 1 tablet by mouth every 12 (twelve) hours. 01/04/20  Yes Lajean Saver, MD  atorvastatin (LIPITOR) 20 MG tablet TAKE 1 TABLET BY MOUTH EVERY NIGHT AT BEDTIME 07/11/16  Yes Dena Billet B, PA-C  celecoxib (CELEBREX) 100 MG capsule Take 100 mg by mouth 2 (two) times daily.  10/22/19  Yes [provider]  diphenoxylate-atropine (LOMOTIL) 2.5-0.025 MG tablet Take 1 tablet by mouth 4 (four) times daily as needed for diarrhea or loose stools. 06/03/15  Yes Nat Christen, MD  gabapentin (NEURONTIN) 600 MG tablet TAKE 1 TABLET BY MOUTH THREE TIMES DAILY 07/13/15  Yes Orlena Sheldon, PA-C  INFANTS IBUPROFEN 50 PO Take 200 mg by mouth daily.   Yes [provider]  lisinopril (ZESTRIL) 10 MG tablet Take 10 mg by mouth daily. 12/04/19  Yes [provider]  Menthol, Topical Analgesic, (BENGAY PAIN RELIEF + MASSAGE EX) Apply 1 application topically daily as needed.   Yes [provider]  ondansetron [facility])  4 MG tablet Take 1 tablet (4 mg total) by mouth every 6 (six) hours. 06/03/15  Yes Nat Christen, MD  oxyCODONE (ROXICODONE) 15 MG immediate release tablet Take 15 mg by mouth 4 (four) times daily.   Yes [provider]       Yes Dena Billet B, PA-C  potassium chloride SA (KLOR-CON) 20 MEQ tablet Take 20 mEq by mouth 2 (two)  times daily. 01/02/20  Yes [provider]  QUEtiapine (SEROQUEL) 200 MG tablet Take 200 mg by mouth at bedtime. 10/22/19  Yes [provider]  rOPINIRole (REQUIP) 3 MG tablet Take 3 mg by mouth at bedtime. 10/23/19  Yes [provider]  tiZANidine (ZANAFLEX) 4 MG tablet TAKE 1 TABLET BY MOUTH EVERY 8 HOURS AS NEEDED 12/03/15  Yes Dixon, Mary B, PA-C  traZODone (DESYREL) 50 MG tablet TAKE 1 TABLET(50 MG) BY MOUTH AT BEDTIME AS NEEDED Patient taking differently: Take 50 mg by mouth at bedtime as needed.  12/03/15  Yes Dena Billet B, PA-C  trolamine salicylate (ASPERCREME) 10 % cream Apply 1 application topically as needed for muscle pain.   Yes [provider]  ALPRAZolam (XANAX) 0.5 MG tablet Take 1 tablet (0.5 mg total) by mouth 2 (two) times daily as needed. Patient not taking: Reported on 01/13/2020 03/02/16   Orlena Sheldon, PA-C        Dixon, Lonie Peak, PA-C       Dixon, Clifton B, PA-C       Dixon, Mary B, PA-C  pantoprazole (PROTONIX) 40 MG tablet TAKE 1 TABLET BY MOUTH EVERY DAY Patient not taking: Reported on 01/04/2020 10/16/17   Dena Billet B, PA-C  traZODone (DESYREL) 50 MG tablet TAKE 1 TABLET BY MOUTH AT BEDTIME AS NEEDED Patient not taking: Reported on 01/15/2020 07/23/15   Orlena Sheldon, PA-C    Current Facility-Administered Medications  Medication Dose Route Frequency Provider Last Rate Last Admin  . 0.45 % NaCl with KCl 20 mEq / L infusion   Intravenous Continuous Heath Lark D, DO 75 mL/hr at 01/16/20 0810 New Bag at 01/16/20 0810  . acetaminophen (TYLENOL) tablet 650 mg  650 mg Oral Q6H PRN Emokpae, Ejiroghene E, MD       Or  . acetaminophen (TYLENOL) suppository 650 mg  650 mg Rectal Q6H PRN Emokpae, Ejiroghene E, MD      . amLODipine (NORVASC) tablet 10 mg  10 mg Oral Daily Emokpae, Ejiroghene E, MD   10 mg at 01/16/20 1100  . atorvastatin (LIPITOR) tablet 20 mg  20 mg Oral QHS Emokpae, Ejiroghene E, MD   20 mg at 01/15/20 2344  . gabapentin  (NEURONTIN) capsule 600 mg  600 mg Oral TID Emokpae, Ejiroghene E, MD   600 mg at 01/16/20 1100  . ipratropium-albuterol (DUONEB) 0.5-2.5 (3) MG/3ML nebulizer solution 3 mL  3 mL Nebulization Q6H PRN Emokpae, Ejiroghene E, MD      . labetalol (NORMODYNE) injection 10 mg  10 mg Intravenous Q2H PRN Emokpae, Ejiroghene E, MD      . lisinopril (ZESTRIL) tablet 10 mg  10 mg Oral Daily Emokpae, Ejiroghene E, MD   10 mg at 01/16/20 1100  . LORazepam (ATIVAN) injection 0.5 mg  0.5 mg Intravenous Q6H PRN Emokpae, Ejiroghene E, MD      . ondansetron (ZOFRAN) tablet 4 mg  4 mg Oral Q6H PRN Emokpae, Ejiroghene E, MD       Or  . ondansetron (ZOFRAN) injection 4 mg  4  mg Intravenous Q6H PRN Emokpae, Ejiroghene E, MD      . oxyCODONE (Oxy IR/ROXICODONE) immediate release tablet 15 mg  15 mg Oral QID Emokpae, Ejiroghene E, MD   15 mg at 01/16/20 1100  . pantoprazole (PROTONIX) injection 40 mg  40 mg Intravenous Q12H Emokpae, Ejiroghene E, MD   40 mg at 01/16/20 0835  . polyethylene glycol (MIRALAX / GLYCOLAX) packet 17 g  17 g Oral Daily PRN Emokpae, Ejiroghene E, MD      . potassium chloride (KLOR-CON) packet 40 mEq  40 mEq Oral BID Emokpae, Ejiroghene E, MD   40 mEq at 01/16/20 1100  . QUEtiapine (SEROQUEL) tablet 200 mg  200 mg Oral QHS Emokpae, Ejiroghene E, MD   200 mg at 01/15/20 2344  . rOPINIRole (REQUIP) tablet 3 mg  3 mg Oral QHS Emokpae, Ejiroghene E, MD   3 mg at 01/15/20 2343    Allergies as of 01/15/2020 - Review Complete 01/15/2020  Allergen Reaction Noted  . Prozac [fluoxetine hcl] Other (See Comments) 05/13/2014  . Zoloft [sertraline hcl]  05/13/2014  . Nicoderm [nicotine] Rash 01/28/2013    Past Medical History:  Diagnosis Date  . Chronic back pain   . GERD (gastroesophageal reflux disease)   . Hiatal hernia   . Hyperlipidemia   . Hypertension   . Pelvic fracture ([place]) 12/12/2012  . Smoker     Past Surgical History:  Procedure Laterality Date  . ABDOMINAL HYSTERECTOMY    .  APPENDECTOMY    . 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 HEMORROIDECTOMY    . LUMBAR SPINE SURGERY    . OOPHORECTOMY    . TONSILLECTOMY      History reviewed. No pertinent family history.  Social History   Socioeconomic History  . Marital status: Divorced    Spouse name: Not on file  . Number of children: Not on file  . Years of education: Not on file  . Highest education level: Not on file  Occupational History  . Not on file  Tobacco Use  . Smoking status: Former Smoker    Packs/day: 0.50    Types: Cigarettes  . Smokeless tobacco: Former Systems developer    Quit date: 12/28/2012  Vaping Use  . Vaping Use: Every day  Substance and Sexual Activity  . Alcohol use: No  . Drug use: No  . Sexual activity: Not on file  Other Topics Concern  . Not on file  Social History Narrative  . Not on file   Social Determinants of Health   Financial Resource Strain:   . Difficulty of Paying Living Expenses: Not on file  Food Insecurity:   . Worried About Charity fundraiser in the Last Year: Not on file  . Ran Out of Food in the Last Year: Not on file  Transportation Needs:   . Lack of Transportation (Medical): Not on file  . Lack of Transportation (Non-Medical): Not on file  Physical Activity:   . Days of Exercise per Week: Not on file  . Minutes of Exercise per Session: Not on file  Stress:   . Feeling of Stress : Not on file  Social Connections:   . Frequency of Communication with Friends and Family: Not on file  . Frequency of Social Gatherings with Friends and Family: Not on file  . Attends Religious Services: Not on file  . Active Member of Clubs or Organizations: Not on file  . Attends Archivist Meetings: Not on file  .  Marital Status: Not on file  Intimate Partner Violence:   . Fear of Current or Ex-Partner: Not on file  . Emotionally Abused: Not on file  . Physically Abused: Not on file  . Sexually Abused: Not on file     ROS:  General: Negative for anorexia, weight  loss, fever, chills, fatigue, weakness. Eyes: Negative for vision changes.  ENT: Negative for hoarseness, difficulty swallowing , nasal congestion. CV: Negative for   angina, palpitations, dyspnea on exertion, peripheral edema. See hpi Respiratory: Negative for dyspnea at rest,  cough, sputum, wheezing. +DOE GI: See history of present illness. GU:  Negative for dysuria, hematuria, urinary incontinence, urinary frequency, nocturnal urination.  MS: Negative for joint pain, low back pain.  Derm: Negative for rash or itching.  Neuro: Negative for weakness, abnormal sensation, seizure, frequent headaches, memory loss, confusion.  Psych: Negative for  depression, suicidal ideation, hallucinations. +anxiety Endo: Negative for unusual weight change.  Heme: Negative for bruising or bleeding. Allergy: Negative for rash or hives.       Physical Examination: Vital signs in last 24 hours: Temp:  [97.9 F (36.6 C)-98.3 F (36.8 C)] 98 F (36.7 C) (11/04 1250) Pulse Rate:  [54-134] 89 (11/04 1250) Resp:  [18-32] 18 (11/04 1250) BP: (98-198)/(54-160) 126/69 (11/04 1250) SpO2:  [89 %-99 %] 93 % (11/04 1250) FiO2 (%):  [28 %] 28 % (11/03 2311) Weight:  [73.9 kg] 73.9 kg (11/03 2338) Last BM Date: 01/14/20  General: elderly WF well nourished in NAD.  Head: Normocephalic, atraumatic.   Eyes: Conjunctiva pink, no icterus. Mouth: Oropharyngeal mucosa moist and pink , no lesions erythema or exudate. Neck: Supple without thyromegaly, masses, or lymphadenopathy.  Lungs: Clear to auscultation bilaterally.  Heart: Regular rate and rhythm, no murmurs rubs or gallops.  Abdomen: Bowel sounds are normal,   nondistended, no hepatosplenomegaly or masses, no abdominal bruits no rebound or guarding.  Tender throughout abd but increased tenderness over ventral hernia (reducible) and ruq.  Rectal: not performed Extremities: No lower extremity edema, clubbing, deformity.  Neuro: Alert and oriented x 4 , grossly  normal neurologically.  Skin: Warm and dry, no rash or jaundice.   Psych: Alert and cooperative, normal mood and affect.        Intake/Output from previous day: 11/03 0701 - 11/04 0700 In: 1150 [IV Piggyback:1150] Out: -  Intake/Output this shift: No intake/output data recorded.  Lab Results: CBC Recent Labs    01/13/20 1301 01/15/20 1928 01/16/20 0501  WBC 9.9 12.8* 8.8  HGB 10.2* 11.8* 8.9*  HCT 35.0* 37.9 30.3*  MCV 91.9 86.5 89.6  PLT 505* 556* 393   BMET Recent Labs    01/13/20 1301 01/15/20 1928 01/16/20 0501  NA 135 134* 138  K 4.1 3.0* 3.2*  CL 104 90* 97*  CO2 '26 28 31  ' GLUCOSE 142* 160* 121*  BUN '17 12 16  ' CREATININE 0.77 0.89 1.22*  CALCIUM 8.3* 8.4* 7.8*   LFT Recent Labs    01/13/20 1301 01/15/20 1928  BILITOT 0.2* 0.7  ALKPHOS 78 93  AST 13* 20  ALT 13 15  PROT 6.4* 7.7  ALBUMIN 3.0* 3.6    Lipase Recent Labs    01/13/20 1301 01/15/20 1928  LIPASE 16 20    PT/INR No results for input(s): LABPROT, INR in the last 72 hours.    Imaging Studies: CT ABDOMEN PELVIS W CONTRAST  Result Date: 01/13/2020 CLINICAL DATA:  Suspected diverticulitis in a patient with worsening abdominal pain. Also  presenting with lower chest pain that been going on for the past 6 days. Dull constant pain. EXAM: CT ABDOMEN AND PELVIS WITH CONTRAST TECHNIQUE: Multidetector CT imaging of the abdomen and pelvis was performed using the standard protocol following bolus administration of intravenous contrast. CONTRAST:  127m OMNIPAQUE IOHEXOL 300 MG/ML  SOLN COMPARISON:  CT angiography 01/04/2020 chest abdomen pelvis. FINDINGS: Lower chest: No focal consolidation within the visualized lungs. Severe four-vessel coronary artery calcifications. At least mild mitral annular calcifications. Small hiatal hernia. Hepatobiliary: Hepatic steatosis. No focal liver abnormality. Nonspecific hydropic gallbladder measuring up to 6 cm on axial imaging. Otherwise no gallstones,  gallbladder wall thickening, or pericholecystic fluid. No biliary dilatation. Pancreas: Diffusely atrophic. No focal lesion. Otherwise normal pancreatic contour. No surrounding inflammatory changes. No main pancreatic ductal dilatation. Spleen: Normal in size without focal abnormality. Adrenals/Urinary Tract: Hyperplastic right adrenal gland. Slightly hyperplastic left adrenal gland. No adrenal nodule bilaterally. Bilateral kidneys enhance symmetrically. No hydronephrosis. No hydroureter. The urinary bladder is unremarkable. Stomach/Bowel: Roux-en-Y gastric bypass. Stomach is within normal limits. No evidence of bowel wall thickening or dilatation. Diffuse sigmoid diverticulosis. The appendix is not visualized and likely surgically absent. Vascular/Lymphatic: No abdominal aorta or iliac aneurysm. Severe atherosclerotic plaque of the aorta and its branches. No abdominal, pelvic, or inguinal lymphadenopathy. Reproductive: Uterus and bilateral adnexa are unremarkable. Other: No intraperitoneal free fluid. No intraperitoneal free gas. No organized fluid collection. Musculoskeletal: Moderate sized fat containing supra-umbilical ventral wall hernia with an abdominal defect of 1.2 cm. Subcutaneus soft tissue edema along the midline lower back at the L5-S1 level. No organized fluid collection. Diffusely decreased bone density. No suspicious lytic or blastic osseous lesions. No acute displaced fracture. Multilevel degenerative changes of the spine. Posterolateral fusion at the L5-S1 level with similar-appearing grade 2 anterolisthesis of L5 on S1. Compression fracture of the T9 vertebral body that appears similar to prior with complete height loss centrally. Old superior and inferior right pubic rami fractures. IMPRESSION: 1. Diffuse sigmoid diverticulosis with no acute diverticulitis. 2. Small hiatal hernia. 3. Moderate sized fat containing supra-umbilical ventral wall hernia with an abdominal defect of 1.2 cm. No findings  suggest ischemia or bowel obstruction. 4. Hepatic steatosis. 5. Aortic Atherosclerosis (ICD10-I70.0) as well as severe coronary artery calcifications. 6. Chronic T9 compression fracture with 100% vertebral body height loss centrally. 7. Status post L5-S1 posterolateral fusion with stable grade 2 anterolisthesis at this level. Limited evaluation of central canal stenosis due to streak artifact originating from the surgical hardware. Electronically Signed   By: MIven FinnM.D.   On: 01/13/2020 17:19   DG Chest Port 1 View  Result Date: 01/15/2020 CLINICAL DATA:  Abdominal and back pain, weakness EXAM: PORTABLE CHEST 1 VIEW COMPARISON:  01/13/2020 FINDINGS: Single frontal view of the chest demonstrates a stable cardiac silhouette. Chronic scarring throughout the lungs, most pronounced at the left lung base. No airspace disease, effusion, or pneumothorax. No acute bony abnormalities. IMPRESSION: 1. Stable areas of scarring.  No acute airspace disease. Electronically Signed   By: MRanda NgoM.D.   On: 01/15/2020 19:55   DG Chest Portable 1 View  Result Date: 01/13/2020 CLINICAL DATA:  Chest pain. EXAM: PORTABLE CHEST 1 VIEW COMPARISON:  CTA chest, abdomen, and pelvis dated January 04, 2020. FINDINGS: The heart size and mediastinal contours are within normal limits. Atherosclerotic calcification of the aortic arch. Normal pulmonary vascularity. Unchanged bibasilar scarring. No focal consolidation, pleural effusion, or pneumothorax. No acute osseous abnormality. Old right-sided rib fractures again noted.  Advanced degenerative changes of the right shoulder. IMPRESSION: No active disease. Electronically Signed   By: Titus Dubin M.D.   On: 01/13/2020 12:33   ECHOCARDIOGRAM COMPLETE  Result Date: 01/16/2020    ECHOCARDIOGRAM REPORT   Patient Name:   MEGON KALINA Date of Exam: 01/16/2020 Medical Rec #:  [id number]      Height:       61.0 in Accession #:    [id number]     Weight:       162.9 lb Date of  Birth:  21-Jul-1940      BSA:          1.731 m Patient Age:    57 years       BP:           112/66 mmHg Patient Gender: F              HR:           77 bpm. Exam Location:  [facility] Procedure: 2D Echo Indications:    Chest Pain 786.50 / R07.9  History:        Patient has no prior history of Echocardiogram examinations.                 Signs/Symptoms:Chest Pain; Risk Factors:Hypertension, Former                 Smoker and Dyslipidemia. GERD.  Sonographer:    Leavy Cella RDCS (AE) Referring Phys: [place]  1. Left ventricular ejection fraction, by estimation, is 60 to 65%. The left ventricle has normal function. The left ventricle has no regional wall motion abnormalities. There is moderate left ventricular hypertrophy. Left ventricular diastolic parameters are consistent with Grade I diastolic dysfunction (impaired relaxation).  2. Right ventricular systolic function is normal. The right ventricular size is normal.  3. The mitral valve is normal in structure. No evidence of mitral valve regurgitation. No evidence of mitral stenosis.  4. The aortic valve is tricuspid. There is mild calcification of the aortic valve. There is mild thickening of the aortic valve. Aortic valve regurgitation is not visualized. No aortic stenosis is present.  5. The inferior vena cava is normal in size with greater than 50% respiratory variability, suggesting right atrial pressure of 3 mmHg. FINDINGS  Left Ventricle: Left ventricular ejection fraction, by estimation, is 60 to 65%. The left ventricle has normal function. The left ventricle has no regional wall motion abnormalities. The left ventricular internal cavity size was normal in size. There is  moderate left ventricular hypertrophy. Left ventricular diastolic parameters are consistent with Grade I diastolic dysfunction (impaired relaxation). Normal left ventricular filling pressure. Right Ventricle: The right ventricular size is normal. No increase  in right ventricular wall thickness. Right ventricular systolic function is normal. Left Atrium: Left atrial size was normal in size. Right Atrium: Right atrial size was normal in size. Pericardium: There is no evidence of pericardial effusion. Mitral Valve: The mitral valve is normal in structure. There is mild thickening of the mitral valve leaflet(s). There is mild calcification of the mitral valve leaflet(s). Mild mitral annular calcification. No evidence of mitral valve regurgitation. No evidence of mitral valve stenosis. Tricuspid Valve: The tricuspid valve is normal in structure. Tricuspid valve regurgitation is trivial. No evidence of tricuspid stenosis. Aortic Valve: The aortic valve is tricuspid. There is mild calcification of the aortic valve. There is mild thickening of the aortic valve. There is mild aortic valve annular calcification. Aortic valve  regurgitation is not visualized. No aortic stenosis  is present. Aortic valve mean gradient measures 7.0 mmHg. Aortic valve peak gradient measures 12.7 mmHg. Aortic valve area, by VTI measures 2.58 cm. Pulmonic Valve: The pulmonic valve was not well visualized. Pulmonic valve regurgitation is not visualized. No evidence of pulmonic stenosis. Aorta: The aortic root is normal in size and structure. Pulmonary Artery: Indeterminant PASP, inadequate TR jet. Venous: The inferior vena cava is normal in size with greater than 50% respiratory variability, suggesting right atrial pressure of 3 mmHg. IAS/Shunts: No atrial level shunt detected by color flow Doppler.  LEFT VENTRICLE PLAX 2D LVIDd:         4.84 cm  Diastology LVIDs:         2.66 cm  LV e' medial:    10.80 cm/s LV PW:         1.20 cm  LV E/e' medial:  7.3 LV IVS:        1.24 cm  LV e' lateral:   5.98 cm/s LVOT diam:     2.20 cm  LV E/e' lateral: 13.1 LV SV:         93 LV SV Index:   54 LVOT Area:     3.80 cm  RIGHT VENTRICLE RV S prime:     16.30 cm/s TAPSE (M-mode): 1.8 cm LEFT ATRIUM             Index        RIGHT ATRIUM           Index LA diam:        5.00 cm 2.89 cm/m  RA Area:     12.70 cm LA Vol (A2C):   53.4 ml 30.85 ml/m RA Volume:   35.50 ml  20.51 ml/m LA Vol (A4C):   49.3 ml 28.48 ml/m LA Biplane Vol: 51.9 ml 29.98 ml/m  AORTIC VALVE AV Area (Vmax):    2.43 cm AV Area (Vmean):   2.01 cm AV Area (VTI):     2.58 cm AV Vmax:           178.41 cm/s AV Vmean:          126.491 cm/s AV VTI:            0.361 m AV Peak Grad:      12.7 mmHg AV Mean Grad:      7.0 mmHg LVOT Vmax:         113.97 cm/s LVOT Vmean:        66.793 cm/s LVOT VTI:          0.245 m LVOT/AV VTI ratio: 0.68  AORTA Ao Root diam: 2.60 cm MITRAL VALVE                TRICUSPID VALVE MV Area (PHT): 2.33 cm     TR Peak grad:   28.1 mmHg MV Decel Time: 326 msec     TR Vmax:        265.00 cm/s MV E velocity: 78.40 cm/s MV A velocity: 124.00 cm/s  SHUNTS MV E/A ratio:  0.63         Systemic VTI:  0.24 m                             Systemic Diam: 2.20 cm Carlyle Dolly MD Electronically signed by Carlyle Dolly MD Signature Date/Time: 01/16/2020/12:37:20 PM    Final    CT Angio Chest/Abd/Pel for Dissection W  and/or W/WO  Result Date: 01/04/2020 CLINICAL DATA:  Chest pain and abdominal pain x6 days. EXAM: CT ANGIOGRAPHY CHEST, ABDOMEN AND PELVIS TECHNIQUE: Non-contrast CT of the chest was initially obtained. Multidetector CT imaging through the chest, abdomen and pelvis was performed using the standard protocol during bolus administration of intravenous contrast. Multiplanar reconstructed images and MIPs were obtained and reviewed to evaluate the vascular anatomy. CONTRAST:  152m OMNIPAQUE IOHEXOL 350 MG/ML SOLN COMPARISON:  None. FINDINGS: CTA CHEST FINDINGS Cardiovascular: There is moderate to marked severity calcification of the thoracic aorta, without evidence of aneurysmal dilatation. Satisfactory opacification of the pulmonary arteries to the segmental level. No evidence of pulmonary embolism. Normal heart size with marked severity  coronary artery calcification. No pericardial effusion. Mediastinum/Nodes: No enlarged mediastinal, hilar, or axillary lymph nodes. Thyroid gland, trachea, and esophagus demonstrate no significant findings. Lungs/Pleura: Mild linear scarring and/or atelectasis is seen within the bilateral lung bases and inferior aspect of the left upper lobe. There is no evidence of a pleural effusion or pneumothorax. Musculoskeletal: A chronic versus congenital appearing linear area is seen involving the body of the sternum. Numerous chronic right-sided rib fractures are seen. A chronic compression fracture deformity is seen involving the T9 vertebral body. Review of the MIP images confirms the above findings. CTA ABDOMEN AND PELVIS FINDINGS VASCULAR Aorta: Marked severity calcification, without aneurysm, dissection, vasculitis or significant stenosis. Celiac: Patent without evidence of aneurysm, dissection, vasculitis or significant stenosis. SMA: Patent without evidence of aneurysm, dissection, vasculitis or significant stenosis. Renals: Both renal arteries are patent without evidence of aneurysm, dissection, vasculitis, fibromuscular dysplasia or significant stenosis. IMA: Patent without evidence of aneurysm, dissection, vasculitis or significant stenosis. Inflow: Marked severity calcification without evidence of aneurysm, dissection, vasculitis or significant stenosis. Veins: No obvious venous abnormality within the limitations of this arterial phase study. Review of the MIP images confirms the above findings. NON-VASCULAR Hepatobiliary: There is mild diffuse fatty infiltration of the liver parenchyma. No focal liver abnormality is seen. The gallbladder is moderately distended without evidence of gallstones, gallbladder wall thickening, or biliary dilatation. Pancreas: Unremarkable. No pancreatic ductal dilatation or surrounding inflammatory changes. Spleen: Normal in size without focal abnormality. Adrenals/Urinary Tract:  There is mild diffuse bilateral adrenal gland enlargement. Kidneys are normal, without renal calculi, focal lesion, or hydronephrosis. A moderate amount of heterogeneous, mildly increased attenuation is seen within the dependent portion of the urinary bladder lumen. Stomach/Bowel: There is a small to moderate sized hiatal hernia. Surgical sutures are seen within the adjacent portion of the gastric region. The appendix is surgically absent. Surgically anastomosed bowel is seen within the anterior aspect of the mid to lower abdomen. No evidence of bowel dilatation. Numerous diverticula are seen throughout the sigmoid colon. Mild thickening of the mid sigmoid colon is seen. Lymphatic: No abnormal abdominal or pelvic lymph nodes are identified. Reproductive: Status post hysterectomy. No adnexal masses. Other: No abdominal wall hernia or abnormality. No abdominopelvic ascites. Musculoskeletal: Chronic fracture deformities are seen involving the right superior and right inferior pubic rami. Bilateral metallic density pedicle screws are seen at the levels of L5 and S1. Approximately 1.1 cm anterolisthesis of the L5 vertebral body is seen on S1. Review of the MIP images confirms the above findings. IMPRESSION: 1. No evidence of aortic dissection or aneurysmal dilatation. 2. Marked severity coronary artery calcification. 3. Fatty liver. 4. Small to moderate sized hiatal hernia. 5. Sigmoid diverticulosis with mild thickening of the mid sigmoid colon which may represent mild acute diverticulitis. 6. Postoperative changes  within the abdomen and pelvis. 7. Chronic fracture deformities involving the right superior and right inferior pubic rami. 8. 1.1 cm anterolisthesis of the L5 vertebral body on S1. 9. Aortic atherosclerosis. Aortic Atherosclerosis (ICD10-I70.0). Electronically Signed   By: Virgina Norfolk M.D.   On: 01/04/2020 22:55  [4 week]   Impression: 79 year old female with chronic back pain (taking narcotics and  NSAIDs), hypertension, history of previous gastric bypass presenting with 2-week history of persistent abdominal pain.  Patient reports pain worse with meals.  She has had some chest pain with her symptoms the past couple of days.  At baseline has some dyspnea on exertion.  Has been to the ED several times as outlined above with CT chest/abdomen/pelvis and CT abdomen pelvis completed.  Initially there was concern for some possible diverticulitis, she was treated with Augmentin without any relief.  Imaging has shown hydropic gallbladder measuring up to 6 cm but no gallbladder wall thickening or pericholecystic fluid.  She also has moderate sized fat-containing supraumbilical ventral wall hernia with abdominal defect of 1.2 cm, no suggestion of ischemia or bowel obstruction.  On exam she is tender throughout the abdomen but increased over the ventral hernia  Abdominal pain may be multifactorial.  Suspect at least in part to a ventral wall hernia.  Cannot exclude possibility of gastritis/peptic ulcer disease given NSAID use.    Anemia noted.  Hemoglobin 11.7 on October 23.  Hemoglobin was 11.8 yesterday and down to 8.9 today.Patient is unsure if she has had a colonoscopy, appears that she may have had one around 2010 but I cannot see the report.  No upper endoscopy on file.   Plan: 1. Clear liquid diet.  N.p.o. after midnight. 2. EGD tomorrow with Dr. Jenetta Downer.  I have discussed the risks, alternatives, benefits with regards to but not limited to the risk of reaction to medication, bleeding, infection, perforation and the patient is agreeable to proceed. Written consent to be obtained. 3. If EGD unremarkable, would consider general surgery consult regarding ventral wall hernia and distended gallbladder. 4. IV PPI twice daily   We would like to thank you for the opportunity to participate in the care of Brenda Hopkins.  Laureen Ochs. Bernarda Caffey [facility] [phone] 11/4/20212:32 PM     LOS: 0 days

## 2020-01-17 ENCOUNTER — Encounter (HOSPITAL_COMMUNITY): Payer: Self-pay | Admitting: Internal Medicine

## 2020-01-17 ENCOUNTER — Encounter (HOSPITAL_COMMUNITY)
Admission: EM | Disposition: A | Discharge status: Home / Self Care | Payer: Self-pay | Source: Home / Self Care | Attending: Internal Medicine

## 2020-01-17 ENCOUNTER — Inpatient Hospital Stay (HOSPITAL_COMMUNITY): Payer: Medicare Other | Admitting: Anesthesiology

## 2020-01-17 DIAGNOSIS — K449 Diaphragmatic hernia without obstruction or gangrene: Secondary | ICD-10-CM

## 2020-01-17 DIAGNOSIS — Z980 Intestinal bypass and anastomosis status: Secondary | ICD-10-CM

## 2020-01-17 DIAGNOSIS — D509 Iron deficiency anemia, unspecified: Secondary | ICD-10-CM

## 2020-01-17 HISTORY — PX: ESOPHAGOGASTRODUODENOSCOPY (EGD) WITH PROPOFOL: SHX5813

## 2020-01-17 LAB — COMPREHENSIVE METABOLIC PANEL
ALT: 9 U/L (ref 0–44)
AST: 10 U/L — ABNORMAL LOW (ref 15–41)
Albumin: 2.5 g/dL — ABNORMAL LOW (ref 3.5–5.0)
Alkaline Phosphatase: 60 U/L (ref 38–126)
Anion gap: 8 (ref 5–15)
BUN: 21 mg/dL (ref 8–23)
CO2: 28 mmol/L (ref 22–32)
Calcium: 7.9 mg/dL — ABNORMAL LOW (ref 8.9–10.3)
Chloride: 100 mmol/L (ref 98–111)
Creatinine, Ser: 1.99 mg/dL — ABNORMAL HIGH (ref 0.44–1.00)
GFR, Estimated: 25 mL/min — ABNORMAL LOW (ref >60)
Glucose, Bld: 82 mg/dL (ref 70–99)
Potassium: 4.7 mmol/L (ref 3.5–5.1)
Sodium: 136 mmol/L (ref 135–145)
Total Bilirubin: 0.6 mg/dL (ref 0.3–1.2)
Total Protein: 5.6 g/dL — ABNORMAL LOW (ref 6.5–8.1)

## 2020-01-17 LAB — CBC
HCT: 30.1 % — ABNORMAL LOW (ref 36.0–46.0)
Hemoglobin: 8.8 g/dL — ABNORMAL LOW (ref 12.0–15.0)
MCH: 26.8 pg (ref 26.0–34.0)
MCHC: 29.2 g/dL — ABNORMAL LOW (ref 30.0–36.0)
MCV: 91.8 fL (ref 80.0–100.0)
Platelets: 393 10*3/uL (ref 150–400)
RBC: 3.28 MIL/uL — ABNORMAL LOW (ref 3.87–5.11)
RDW: 16.9 % — ABNORMAL HIGH (ref 11.5–15.5)
WBC: 8.3 10*3/uL (ref 4.0–10.5)
nRBC: 0 % (ref 0.0–0.2)

## 2020-01-17 LAB — MAGNESIUM: Magnesium: 2.2 mg/dL (ref 1.7–2.4)

## 2020-01-17 SURGERY — ESOPHAGOGASTRODUODENOSCOPY (EGD) WITH PROPOFOL
Anesthesia: General

## 2020-01-17 MED ORDER — OXYCODONE HCL 5 MG PO TABS
5.0000 mg | ORAL_TABLET | Freq: Four times a day (QID) | ORAL | Status: DC | PRN
  Administered 2020-01-17 – 2020-01-19 (×5): 5 mg via ORAL
  Filled 2020-01-17 (×6): qty 1

## 2020-01-17 MED ORDER — STERILE WATER FOR IRRIGATION IR SOLN
Status: DC | PRN
  Administered 2020-01-17: 100 mL

## 2020-01-17 MED ORDER — PROPOFOL 10 MG/ML IV BOLUS
INTRAVENOUS | Status: DC | PRN
  Administered 2020-01-17: 20 mg via INTRAVENOUS
  Administered 2020-01-17 (×2): 30 mg via INTRAVENOUS
  Administered 2020-01-17: 20 mg via INTRAVENOUS
  Administered 2020-01-17: 30 mg via INTRAVENOUS

## 2020-01-17 MED ORDER — SODIUM CHLORIDE 0.9 % IV SOLN
510.0000 mg | Freq: Once | INTRAVENOUS | Status: DC
  Filled 2020-01-17: qty 17

## 2020-01-17 MED ORDER — LACTATED RINGERS IV SOLN: INTRAVENOUS | Status: DC

## 2020-01-17 MED ORDER — SODIUM CHLORIDE 0.9 % IV SOLN: INTRAVENOUS | Status: DC

## 2020-01-17 MED ORDER — LIDOCAINE HCL (CARDIAC) PF 100 MG/5ML IV SOSY
PREFILLED_SYRINGE | INTRAVENOUS | Status: DC | PRN
  Administered 2020-01-17: 100 mg via INTRAVENOUS

## 2020-01-17 MED ORDER — SODIUM CHLORIDE 0.9 % IV BOLUS
500.0000 mL | Freq: Once | INTRAVENOUS | Status: AC
  Administered 2020-01-17: 500 mL via INTRAVENOUS

## 2020-01-17 MED ORDER — SUCRALFATE 1 G PO TABS
1.0000 g | ORAL_TABLET | Freq: Two times a day (BID) | ORAL | Status: DC
  Administered 2020-01-17 – 2020-01-19 (×5): 1 g via ORAL
  Filled 2020-01-17 (×5): qty 1

## 2020-01-17 NOTE — Progress Notes (Signed)
Purewick in place during night but no output in canister. Small amount of urine noted on bed pad. Bladder scan done showing , patient has been NPO during night for endoscopy today. No c/o pain or discomfort.

## 2020-01-17 NOTE — Brief Op Note (Addendum)
01/15/2020 - 01/17/2020  2:48 PM  PATIENT:  Brenda Hopkins  79 y.o. female  PRE-OPERATIVE DIAGNOSIS:  epigastric pain, anemia  POST-OPERATIVE DIAGNOSIS:  z-line @ 34; distal end of the pouch @ 42; esophagitis; hiatal hernia @ 36; marginal ulcer in the small bowel;   PROCEDURE:  Procedure(s): ESOPHAGOGASTRODUODENOSCOPY (EGD) WITH PROPOFOL (N/A)  SURGEON:  Surgeon(s) and Role:    * Dolores Frame, MD - Primary  Patient underwent EGD under propofol sedation.  Patient was found to have 2 cm hiatal hernia.  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  There was no presence of active bleeding.  No intervention was performed.  Both the afferent and efferent limbs look healthy.  Recommendations: - Return patient to hospital ward for ongoing care.  - Resume previous diet.  - Use sucralfate tablets 1 gram PO BID for 1 month.  - Use Prilosec (omeprazole) 40 mg PO BID (open capsule) for 3 months, then once a day.  - Repeat EGD in 3 months. - Patient to avoid all NSAIDs (previously taking ibuprofen) and quit smoking  Katrinka Blazing, MD Gastroenterology and Hepatology [facility]

## 2020-01-17 NOTE — Op Note (Signed)
[facility] Patient Name: Brenda Hopkins Procedure Date: 01/17/2020 1:56 PM MRN: [id number] Date of Birth: April 28, 1940 Attending MD: Katrinka Blazing ,  CSN: [id number] Age: 79 Admit Type: Inpatient Procedure:                Upper GI endoscopy Indications:              Iron deficiency anemia Providers:                Katrinka Blazing, Crystal Page, Edythe Clarity,                            Technician Referring MD:              Medicines:                Monitored Anesthesia Care Complications:            No immediate complications. Estimated Blood Loss:     Estimated blood loss: none. Procedure:                Pre-Anesthesia Assessment:                           - Prior to the procedure, a History and Physical                            was performed, and patient medications, allergies                            and sensitivities were reviewed. The patient's                            tolerance of previous anesthesia was reviewed.                           - The risks and benefits of the procedure and the                            sedation options and risks were discussed with the                            patient. All questions were answered and informed                            consent was obtained.                           - ASA Grade Assessment: III - A patient with severe                            systemic disease.                           After obtaining informed consent, the endoscope was                            passed under direct vision. Throughout the  procedure, the patient's blood pressure, pulse, and                            oxygen saturations were monitored continuously. The                            Endoscope was introduced through the mouth, and                            advanced to the jejunum. The upper GI endoscopy was                            accomplished without difficulty. The patient                            tolerated  the procedure well. Scope In: 2:21:46 PM Scope Out: 2:29:23 PM Total Procedure Duration: 0 hours 7 minutes 37 seconds  Findings:      The Z-line was regular and was found 34 cm from the incisors.      A 2 cm hiatal hernia was present.      Evidence of a Roux-en-Y gastrojejunostomy was found. The pouch measured       6 cm in length. The gastrojejunal anastomosis was characterized by       congestion and erythema. This was traversed. The pouch-to-jejunum limb       was characterized by ulceration, wich involved at least 50% of the       circumference, approximately 4-5 cm in size. Both jejunual limbs were       examined and looked healthy. Impression:               - Z-line regular, 34 cm from the incisors.                           - 2 cm hiatal hernia.                           - Roux-en-Y gastrojejunostomy with gastrojejunal                            anastomosis characterized by congestion and                            erythema with a large anastomotic ulcer (jejunal                            side).                           - Rest of both afferent and efferent limbs were                            within normal limits.                           - No specimens collected. Moderate Sedation:      Per Anesthesia Care Recommendation:           -  Return patient to hospital ward for ongoing care.                           - Resume previous diet.                           - Use sucralfate tablets 1 gram PO BID for 1 month.                           - Use Prilosec (omeprazole) 40 mg PO BID (open                            capsule) for 3 months, then once a day.                           - Repeat EGD in 3 months. Procedure Code(s):        --- Professional ---                           [phone], GC, Esophagogastroduodenoscopy, flexible,                            transoral; diagnostic, including collection of                            specimen(s) by brushing or washing, when performed                             (separate procedure) Diagnosis Code(s):        --- Professional ---                           K44.9, Diaphragmatic hernia without obstruction or                            gangrene                           Z98.0, Intestinal bypass and anastomosis status                           D50.9, Iron deficiency anemia, unspecified CPT copyright 2019 [employer]. All rights reserved. The codes documented in this report are preliminary and upon coder review may  be revised to meet current compliance requirements. Katrinka Blazing, MD Katrinka Blazing,  01/17/2020 2:48:24 PM This report has been signed electronically. Number of Addenda: 0

## 2020-01-17 NOTE — Progress Notes (Addendum)
BP 89/52, pt lethargic but arousable. Notified Dr. Thomes Dinning to make aware. New order for bolus at 266ml/hr and to hold off sedative meds. Bolus started. Will re-check BP after bolus.

## 2020-01-17 NOTE — Progress Notes (Signed)
We will proceed with EGD as scheduled.  I thoroughly discussed with the patient his procedure, including the risks involved. Patient understands what the procedure involves including the benefits and any risks. Patient understands alternatives to the proposed procedure. Risks including (but not limited to) bleeding, tearing of the lining (perforation), rupture of adjacent organs, problems with heart and lung function, infection, and medication reactions. A small percentage of complications may require surgery, hospitalization, repeat endoscopic procedure, and/or transfusion.  Patient understood and agreed.  Nyron Mozer Castaneda, MD Gastroenterology and Hepatology [facility]  

## 2020-01-17 NOTE — Care Management Important Message (Signed)
Important Message  Patient Details  Name: Brenda Hopkins MRN: [id number] Date of Birth: 02-12-1941   Medicare Important Message Given:  Yes     Corey Harold 01/17/2020, 4:08 PM

## 2020-01-17 NOTE — Anesthesia Preprocedure Evaluation (Signed)
Anesthesia Evaluation  Patient identified by MRN, date of birth, ID band Patient awake    Reviewed: Allergy & Precautions, H&P , NPO status , Patient's Chart, lab work & pertinent test results, reviewed documented beta blocker date and time   Airway Mallampati: II  TM Distance: >3 FB Neck ROM: full    Dental no notable dental hx.    Pulmonary neg pulmonary ROS, former smoker,    Pulmonary exam normal breath sounds clear to auscultation       Cardiovascular Exercise Tolerance: Good hypertension, negative cardio ROS   Rhythm:regular Rate:Normal     Neuro/Psych PSYCHIATRIC DISORDERS Anxiety negative neurological ROS     GI/Hepatic Neg liver ROS, hiatal hernia, GERD  Medicated,  Endo/Other  negative endocrine ROS  Renal/GU negative Renal ROS  negative genitourinary   Musculoskeletal   Abdominal   Peds  Hematology negative hematology ROS (+)   Anesthesia Other Findings   Reproductive/Obstetrics negative OB ROS                             Anesthesia Physical Anesthesia Plan  ASA: III  Anesthesia Plan: General   Post-op Pain Management:    Induction:   PONV Risk Score and Plan: Propofol infusion  Airway Management Planned:   Additional Equipment:   Intra-op Plan:   Post-operative Plan:   Informed Consent: I have reviewed the patients History and Physical, chart, labs and discussed the procedure including the risks, benefits and alternatives for the proposed anesthesia with the patient or authorized representative who has indicated his/her understanding and acceptance.     Dental Advisory Given  Plan Discussed with: CRNA  Anesthesia Plan Comments:         Anesthesia Quick Evaluation

## 2020-01-17 NOTE — Anesthesia Postprocedure Evaluation (Signed)
Anesthesia Post Note  Patient: Brenda Hopkins  Procedure(s) Performed: ESOPHAGOGASTRODUODENOSCOPY (EGD) WITH PROPOFOL (N/A )  Patient location during evaluation: PACU Anesthesia Type: General Level of consciousness: awake Pain management: satisfactory to patient Vital Signs Assessment: post-procedure vital signs reviewed and stable Respiratory status: spontaneous breathing, respiratory function stable and patient connected to nasal cannula oxygen Cardiovascular status: stable Postop Assessment: no apparent nausea or vomiting Anesthetic complications: no   No complications documented.   Last Vitals:  Vitals:   01/17/20 0732 01/17/20 1242  BP: (!) 98/47 135/65  Pulse: 84 90  Resp:  (!) 21  Temp:  37.2 C  SpO2:  92%    Last Pain:  Vitals:   01/17/20 1409  TempSrc:   PainSc: 8                  Lorin Glass

## 2020-01-17 NOTE — Progress Notes (Signed)
RN called due to low BP of 80/49 with a MAP of 59.  She received Seroquel and oxycodone brought to bed last night.  IV NS 500 mL bolus x1 was given.  RN asked to hold any further sedative opiates at this time.

## 2020-01-17 NOTE — Progress Notes (Signed)
PROGRESS NOTE    Tzipporah Nagorski  ZOX:[id number] DOB: 1940/10/15 DOA: 01/15/2020 PCP: Shawnie Dapper, PA-C   Brief Narrative:  Per HPI: Brenda Hopkins a 79 y.o.femalewith medical history significant forhypertension, anxiety,chronic pain.Patient presented to the ED with complaints of persistent abdominal pain started at least 2 weeks ago. Patient was in the ED 10/23 for same abdominal pain, CTA chest abdomen and pelvis showed multiple acute diverticulitis, and marked severity coronary artery calcification. Patient was discharged home, with Augmentin, but patient states the same abdominal pain has persisted till today.  Patient was in the ED again, 2 days ago,- 01/13/2020,with some abdominal pain. CT abdominal pelvis without acute diverticulitis.,Chronic T9 compression fracture.   Patient today complains of pain in upper abdomen, nonradiating. No vomiting, no loose stools. No black stools, no vomiting of blood, no blood in stools. Patient admitsto taking a lot of NSAIDs every day. She also complains of back pain-but this is chronic. Patient complaints also included chest pain that started today, nonradiating, with associated difficulty breathing.She denies prior episodes of chest pain even with activity.She is unable to quantify her chest pain symptoms further.  Daughter reports poor oral intake over the past few days.  11/4: Patient was admitted with intractable epigastric abdominal pain in the setting of frequent NSAID use and is noted to have generalized anxiety disorder as well.  She has been seen by GI with plans for EGD in a.m. to evaluate for gastritis or peptic ulcer disease.  11/5: Patient noted to have hypotension overnight and is now oliguric with some AKI noted.  Plan to hold sedating medications and start IV fluid and monitor closely.  Plan for EGD today.  Assessment & Plan:   Principal Problem:   Intractable epigastric abdominal pain Active Problems:    Hypertension   Chronic back pain   Hiatal hernia   Smoker   Generalized anxiety disorder   Epigastric pain   Ventral hernia without obstruction or gangrene   Intractable epigastric abdominal pain-possibly multifactorial -Appreciate GI evaluation for EGD today to rule out gastritis/peptic ulcer disease given NSAID use -May have ventral hernia as part of the problem along with hydropic gallbladder, but no signs of cholecystitis -PPI IV twice daily -Holding home pain medications  Acute toxic encephalopathy -Likely related to polypharmacy in the setting of AKI -Continue to monitor off sedating agents to include Seroquel, narcotics, Requip, and gabapentin -Should improve his renal function recovers and patient remains off of these agents  AKI-oliguric -Poor urine output noted overnight -Likely related to some ATN related to hypotension -Continue IV fluid and maintain SBP greater than 90 -Monitor strict I's and O's and daily weights -Urine electrolytes ordered  Acute hypoxemic respiratory failure -Likely multifactorial with narcotic use in the setting of COPD -DuoNebs as needed -Continue nasal cannula supplementation and wean prior to discharge, otherwise may need home oxygen  Anemia-stable -Noted to have hemoglobin in the 11-12 range and is down to 8.8 today -No prior endoscopy noted and may have had colonoscopy in 2010 -Anemia panel with iron deficiency for which Feraheme will be given on 11/5  Chest pain -Troponin with flat trend -2D echocardiogram with no significant findings LVEF 60-65% and diastolic dysfunction grade 1 -CT chest on 10/23 with marked severe coronary artery calcification noted -Continue statin  Generalized anxiety disorder -PCP not prescribing Xanax because patient on opioids -IV Ativan 0.5 mg every 8 hours as needed -May require some antianxiety medications outpatient, will refer to PCP -Hold Seroquel at bedtime  Chronic  back pain -Hold  oxycodone and gabapentin given hypotension and some encephalopathy  Hypokalemia-resolved -Recheck labs and magnesium in a.m.  Hypertension -Noted hypotension overnight -Hold lisinopril and Norvasc -Hold sedating medications -Labetalol as needed   DVT prophylaxis: SCDs Code Status: Full code Family Communication:  Tried calling daughter 11/5 with no response Disposition Plan:  Status is: Inpatient  Remains inpatient appropriate because:IV treatments appropriate due to intensity of illness or inability to take PO and Inpatient level of care appropriate due to severity of illness   Dispo: The patient is from: Home  Anticipated d/c is to: Home vs SNF  Anticipated d/c date is: 2-3 days  Patient currently is not medically stable to d/c.  Patient requires EGD for further evaluation today and requires IV fluid for treatment of AKI.  Consultants:   GI  Procedures:   See below; plan for EGD 11/5  Antimicrobials:   None   Subjective: Patient seen and evaluated today with noted hypotension overnight for which she received fluid bolus.  She is noted to be quite altered this morning as well and has been oliguric.  Objective: Vitals:   01/17/20 0458 01/17/20 0534 01/17/20 0645 01/17/20 0732  BP: (!) 89/52 (!) 86/49 (!) 93/48 (!) 98/47  Pulse: 85 87 84 84  Resp: 18     Temp: 99.4 F (37.4 C)     TempSrc: Oral     SpO2: 92% 94%    Weight:      Height:        Intake/Output Summary (Last 24 hours) at 01/17/2020 1206 Last data filed at 01/17/2020 0900 Gross per 24 hour  Intake 57.18 ml  Output --  Net 57.18 ml   Filed Weights   01/15/20 2338  Weight: 73.9 kg    Examination:  General exam: Appears confused Respiratory system: Clear to auscultation. Respiratory effort normal.  Currently on 2.5 L nasal cannula oxygen. Cardiovascular system: S1 & S2 heard, RRR.  Gastrointestinal system: Abdomen is nondistended, soft and  nontender. Central nervous system: Alert and awake, but confused Extremities: No edema Skin: No rashes, lesions or ulcers Psychiatry: Cannot be assessed    Data Reviewed: I have personally reviewed following labs and imaging studies  CBC: Recent Labs  Lab 01/13/20 1301 01/15/20 1928 01/16/20 0501 01/17/20 0658  WBC 9.9 12.8* 8.8 8.3  NEUTROABS 8.0* 10.8*  --   --   HGB 10.2* 11.8* 8.9* 8.8*  HCT 35.0* 37.9 30.3* 30.1*  MCV 91.9 86.5 89.6 91.8  PLT 505* 556* 393 393   Basic Metabolic Panel: Recent Labs  Lab 01/13/20 1301 01/15/20 1928 01/15/20 2127 01/16/20 0501 01/17/20 0658  NA 135 134*  --  138 136  K 4.1 3.0*  --  3.2* 4.7  CL 104 90*  --  97* 100  CO2 26 28  --  31 28  GLUCOSE 142* 160*  --  121* 82  BUN 17 12  --  16 21  CREATININE 0.77 0.89  --  1.22* 1.99*  CALCIUM 8.3* 8.4*  --  7.8* 7.9*  MG  --   --  1.5*  --  2.2   GFR: Estimated Creatinine Clearance: 21.8 mL/min (A) (by C-G formula based on SCr of 1.99 mg/dL (H)). Liver Function Tests: Recent Labs  Lab 01/13/20 1301 01/15/20 1928 01/17/20 0658  AST 13* 20 10*  ALT ALKPHOS 78 93 60  BILITOT 0.2* 0.7 0.6  PROT 6.4* 7.7 5.6*  ALBUMIN 3.0* 3.6 2.5*  Recent Labs  Lab 01/13/20 1301 01/15/20 1928  LIPASE 16 20   No results for input(s): AMMONIA in the last 168 hours. Coagulation Profile: No results for input(s): INR, PROTIME in the last 168 hours. Cardiac Enzymes: No results for input(s): CKTOTAL, CKMB, CKMBINDEX, TROPONINI in the last 168 hours. BNP (last 3 results) No results for input(s): PROBNP in the last 8760 hours. HbA1C: No results for input(s): HGBA1C in the last 72 hours. CBG: No results for input(s): GLUCAP in the last 168 hours. Lipid Profile: No results for input(s): CHOL, HDL, LDLCALC, TRIG, CHOLHDL, LDLDIRECT in the last 72 hours. Thyroid Function Tests: No results for input(s): TSH, T4TOTAL, FREET4, T3FREE, THYROIDAB in the last 72 hours. Anemia  Panel: Recent Labs    01/16/20 1757  VITAMINB12 762  FOLATE 28.8  FERRITIN 13  TIBC 292  IRON 19*  RETICCTPCT 1.5   Sepsis Labs: Recent Labs  Lab 01/13/20 1301 01/13/20 1439  LATICACIDVEN 0.5 0.9    Recent Results (from the past 240 hour(s))  Respiratory Panel by RT PCR (Flu A&B, Covid) - Nasopharyngeal Swab     Status: None   Collection Time: 01/15/20  9:18 PM   Specimen: Nasopharyngeal Swab  Result Value Ref Range Status   SARS Coronavirus 2 by RT PCR NEGATIVE NEGATIVE Final    Comment: (NOTE) SARS-CoV-2 target nucleic acids are NOT DETECTED.  The SARS-CoV-2 RNA is generally detectable in upper respiratoy specimens during the acute phase of infection. The lowest concentration of SARS-CoV-2 viral copies this assay can detect is 131 copies/mL. A negative result does not preclude SARS-Cov-2 infection and should not be used as the sole basis for treatment or other patient management decisions. A negative result may occur with  improper specimen collection/handling, submission of specimen other than nasopharyngeal swab, presence of viral mutation(s) within the areas targeted by this assay, and inadequate number of viral copies (<131 copies/mL). A negative result must be combined with clinical observations, patient history, and epidemiological information. The expected result is Negative.  Fact Sheet for Patients:  https://www.moore.com/https://www.fda.gov/media/142436/download  Fact Sheet for Healthcare Providers:  https://www.young.biz/https://www.fda.gov/media/142435/download  This test is no t yet approved or cleared by the [place] FDA and  has been authorized for detection and/or diagnosis of SARS-CoV-2 by FDA under an Emergency Use Authorization (EUA). This EUA will remain  in effect (meaning this test can be used) for the duration of the COVID-19 declaration under Section 564(b)(1) of the Act, 21 U.S.C. section 360bbb-3(b)(1), unless the authorization is terminated or revoked sooner.      Influenza A by PCR NEGATIVE NEGATIVE Final   Influenza B by PCR NEGATIVE NEGATIVE Final    Comment: (NOTE) The Xpert Xpress SARS-CoV-2/FLU/RSV assay is intended as an aid in  the diagnosis of influenza from Nasopharyngeal swab specimens and  should not be used as a sole basis for treatment. Nasal washings and  aspirates are unacceptable for Xpert Xpress SARS-CoV-2/FLU/RSV  testing.  Fact Sheet for Patients: https://www.moore.com/https://www.fda.gov/media/142436/download  Fact Sheet for Healthcare Providers: https://www.young.biz/https://www.fda.gov/media/142435/download  This test is not yet approved or cleared by the [place] FDA and  has been authorized for detection and/or diagnosis of SARS-CoV-2 by  FDA under an Emergency Use Authorization (EUA). This EUA will remain  in effect (meaning this test can be used) for the duration of the  Covid-19 declaration under Section 564(b)(1) of the Act, 21  U.S.C. section 360bbb-3(b)(1), unless the authorization is  terminated or revoked. Performed at [facility], [address] [place],  [place] [address]          Radiology Studies: [facility] Chest Port 1 View  Result Date: 01/15/2020 CLINICAL DATA:  Abdominal and back pain, weakness EXAM: PORTABLE CHEST 1 VIEW COMPARISON:  01/13/2020 FINDINGS: Single frontal view of the chest demonstrates a stable cardiac silhouette. Chronic scarring throughout the lungs, most pronounced at the left lung base. No airspace disease, effusion, or pneumothorax. No acute bony abnormalities. IMPRESSION: 1. Stable areas of scarring.  No acute airspace disease. Electronically Signed   By: Sharlet Salina M.D.   On: 01/15/2020 19:55   ECHOCARDIOGRAM COMPLETE  Result Date: 01/16/2020    ECHOCARDIOGRAM REPORT   Patient Name:   Brenda Hopkins Date of Exam: 01/16/2020 Medical Rec #:  [id number]      Height:       61.0 in Accession #:    [id number]     Weight:       162.9 lb Date of Birth:  05-25-1940      BSA:          1.731 m Patient Age:    79 years       BP:            112/66 mmHg Patient Gender: F              HR:           77 bpm. [facility] Procedure: 2D Echo Indications:    Chest Pain 786.50 / R07.9  History:        Patient has no prior history of Echocardiogram examinations.                 Signs/Symptoms:Chest Pain; Risk Factors:Hypertension, Former                 Smoker and Dyslipidemia. GERD.  Sonographer:    Jeryl Columbia RDCS (AE) Referring Phys: [id number] [facility] IMPRESSIONS  1. Left ventricular ejection fraction, by estimation, is 60 to 65%. The left ventricle has normal function. The left ventricle has no regional wall motion abnormalities. There is moderate left ventricular hypertrophy. Left ventricular diastolic parameters are consistent with Grade I diastolic dysfunction (impaired relaxation).  2. Right ventricular systolic function is normal. The right ventricular size is normal.  3. The mitral valve is normal in structure. No evidence of mitral valve regurgitation. No evidence of mitral stenosis.  4. The aortic valve is tricuspid. There is mild calcification of the aortic valve. There is mild thickening of the aortic valve. Aortic valve regurgitation is not visualized. No aortic stenosis is present.  5. The inferior vena cava is normal in size with greater than 50% respiratory variability, suggesting right atrial pressure of 3 mmHg. FINDINGS  Left Ventricle: Left ventricular ejection fraction, by estimation, is 60 to 65%. The left ventricle has normal function. The left ventricle has no regional wall motion abnormalities. The left ventricular internal cavity size was normal in size. There is  moderate left ventricular hypertrophy. Left ventricular diastolic parameters are consistent with Grade I diastolic dysfunction (impaired relaxation). Normal left ventricular filling pressure. Right Ventricle: The right ventricular size is normal. No increase in right ventricular wall thickness. Right ventricular systolic function is normal.  Left Atrium: Left atrial size was normal in size. Right Atrium: Right atrial size was normal in size. Pericardium: There is no evidence of pericardial effusion. Mitral Valve: The mitral valve is normal in structure. There is mild thickening of the mitral valve leaflet(s). There is mild calcification  of the mitral valve leaflet(s). Mild mitral annular calcification. No evidence of mitral valve regurgitation. No evidence of mitral valve stenosis. Tricuspid Valve: The tricuspid valve is normal in structure. Tricuspid valve regurgitation is trivial. No evidence of tricuspid stenosis. Aortic Valve: The aortic valve is tricuspid. There is mild calcification of the aortic valve. There is mild thickening of the aortic valve. There is mild aortic valve annular calcification. Aortic valve regurgitation is not visualized. No aortic stenosis  is present. Aortic valve mean gradient measures 7.0 mmHg. Aortic valve peak gradient measures 12.7 mmHg. Aortic valve area, by VTI measures 2.58 cm. Pulmonic Valve: The pulmonic valve was not well visualized. Pulmonic valve regurgitation is not visualized. No evidence of pulmonic stenosis. Aorta: The aortic root is normal in size and structure. Pulmonary Artery: Indeterminant PASP, inadequate TR jet. Venous: The inferior vena cava is normal in size with greater than 50% respiratory variability, suggesting right atrial pressure of 3 mmHg. IAS/Shunts: No atrial level shunt detected by color flow Doppler.  LEFT VENTRICLE PLAX 2D LVIDd:         4.84 cm  Diastology LVIDs:         2.66 cm  LV e' medial:    10.80 cm/s LV PW:         1.20 cm  LV E/e' medial:  7.3 LV IVS:        1.24 cm  LV e' lateral:   5.98 cm/s LVOT diam:     2.20 cm  LV E/e' lateral: 13.1 LV SV:         93 LV SV Index:   54 LVOT Area:     3.80 cm  RIGHT VENTRICLE RV S prime:     16.30 cm/s TAPSE (M-mode): 1.8 cm LEFT ATRIUM             Index       RIGHT ATRIUM           Index LA diam:        5.00 cm 2.89 cm/m  RA Area:      12.70 cm LA Vol (A2C):   53.4 ml 30.85 ml/m RA Volume:   35.50 ml  20.51 ml/m LA Vol (A4C):   49.3 ml 28.48 ml/m LA Biplane Vol: 51.9 ml 29.98 ml/m  AORTIC VALVE AV Area (Vmax):    2.43 cm AV Area (Vmean):   2.01 cm AV Area (VTI):     2.58 cm AV Vmax:           178.41 cm/s AV Vmean:          126.491 cm/s AV VTI:            0.361 m AV Peak Grad:      12.7 mmHg AV Mean Grad:      7.0 mmHg LVOT Vmax:         113.97 cm/s LVOT Vmean:        66.793 cm/s LVOT VTI:          0.245 m LVOT/AV VTI ratio: 0.68  AORTA Ao Root diam: 2.60 cm MITRAL VALVE                TRICUSPID VALVE MV Area (PHT): 2.33 cm     TR Peak grad:   28.1 mmHg MV Decel Time: 326 msec     TR Vmax:        265.00 cm/s MV E velocity: 78.40 cm/s MV A velocity: 124.00 cm/s  SHUNTS MV E/A ratio:  0.63         Systemic VTI:  0.24 m                             Systemic Diam: 2.20 cm Dina Rich MD Electronically signed by Dina Rich MD Signature Date/Time: 01/16/2020/12:37:20 PM    Final         Scheduled Meds: . atorvastatin  20 mg Oral QHS  . pantoprazole (PROTONIX) IV  40 mg Intravenous Q12H  . potassium chloride  40 mEq Oral BID   Continuous Infusions: . ferumoxytol    . lactated ringers 100 mL/hr at 01/17/20 0848     LOS: 1 day    Time spent: 35 minutes    Umaima Scholten D Sherryll Burger, DO [facility]  If 7PM-7AM, please contact night-coverage [email or web address] 01/17/2020, 12:06 PM

## 2020-01-17 NOTE — Transfer of Care (Signed)
Immediate Anesthesia Transfer of Care Note  Patient: Brenda Hopkins  Procedure(s) Performed: ESOPHAGOGASTRODUODENOSCOPY (EGD) WITH PROPOFOL (N/A )  Patient Location: PACU  Anesthesia Type:General  Level of Consciousness: drowsy  Airway & Oxygen Therapy: Patient Spontanous Breathing  Post-op Assessment: Report given to RN and Post -op Vital signs reviewed and stable  Post vital signs: Reviewed and stable  Last Vitals:  Vitals Value Taken Time  BP 129/105 01/17/20 1434  Temp    Pulse 93 01/17/20 1436  Resp 23 01/17/20 1436  SpO2 95 % 01/17/20 1436  Vitals shown include unvalidated device data.  Last Pain:  Vitals:   01/17/20 1409  TempSrc:   PainSc: 8          Complications: No complications documented.

## 2020-01-18 LAB — BASIC METABOLIC PANEL
Anion gap: 5 (ref 5–15)
BUN: 18 mg/dL (ref 8–23)
CO2: 29 mmol/L (ref 22–32)
Calcium: 7.9 mg/dL — ABNORMAL LOW (ref 8.9–10.3)
Chloride: 101 mmol/L (ref 98–111)
Creatinine, Ser: 0.96 mg/dL (ref 0.44–1.00)
GFR, Estimated: >60 mL/min (ref >60)
Glucose, Bld: 91 mg/dL (ref 70–99)
Potassium: 4.9 mmol/L (ref 3.5–5.1)
Sodium: 135 mmol/L (ref 135–145)

## 2020-01-18 LAB — MAGNESIUM: Magnesium: 1.9 mg/dL (ref 1.7–2.4)

## 2020-01-18 LAB — CBC
HCT: 29 % — ABNORMAL LOW (ref 36.0–46.0)
Hemoglobin: 8.5 g/dL — ABNORMAL LOW (ref 12.0–15.0)
MCH: 27.1 pg (ref 26.0–34.0)
MCHC: 29.3 g/dL — ABNORMAL LOW (ref 30.0–36.0)
MCV: 92.4 fL (ref 80.0–100.0)
Platelets: 370 10*3/uL (ref 150–400)
RBC: 3.14 MIL/uL — ABNORMAL LOW (ref 3.87–5.11)
RDW: 16.6 % — ABNORMAL HIGH (ref 11.5–15.5)
WBC: 7.7 10*3/uL (ref 4.0–10.5)
nRBC: 0 % (ref 0.0–0.2)

## 2020-01-18 MED ORDER — FAMOTIDINE 20 MG PO TABS
20.0000 mg | ORAL_TABLET | Freq: Once | ORAL | Status: AC
  Administered 2020-01-18: 20 mg via ORAL
  Filled 2020-01-18: qty 1

## 2020-01-18 MED ORDER — ROPINIROLE HCL 1 MG PO TABS
2.0000 mg | ORAL_TABLET | Freq: Every day | ORAL | Status: DC
  Administered 2020-01-18: 1 mg via ORAL
  Filled 2020-01-18: qty 2

## 2020-01-18 MED ORDER — GABAPENTIN 300 MG PO CAPS
600.0000 mg | ORAL_CAPSULE | Freq: Three times a day (TID) | ORAL | Status: DC
  Administered 2020-01-18 – 2020-01-19 (×4): 600 mg via ORAL
  Filled 2020-01-18 (×4): qty 2

## 2020-01-18 MED ORDER — OXYCODONE HCL 5 MG PO TABS
15.0000 mg | ORAL_TABLET | Freq: Two times a day (BID) | ORAL | Status: DC
  Administered 2020-01-18 – 2020-01-19 (×3): 15 mg via ORAL
  Filled 2020-01-18 (×3): qty 3

## 2020-01-18 NOTE — Progress Notes (Signed)
PROGRESS NOTE    Brenda Hopkins  ZOX:[id number] DOB: 08/08/1940 DOA: 01/15/2020 PCP: Shawnie DapperMann, Benjamin L, PA-C   Brief Narrative:  Per HPI: Brenda OrBrenda [name] a [age] y.o.femalewith medical history significant forhypertension, anxiety,chronic pain.Patient presented to the ED with complaints of persistent abdominal pain started at least 2 weeks ago. Patient was in the ED 10/23 for same abdominal pain, CTA chest abdomen and pelvis showed multiple acute diverticulitis, and marked severity coronary artery calcification. Patient was discharged home, with Augmentin, but patient states the same abdominal pain has persisted till today.  Patient was in the ED again, 2 days ago,- 01/13/2020,with some abdominal pain. CT abdominal pelvis without acute diverticulitis.,Chronic T9 compression fracture.   Patient today complains of pain in upper abdomen, nonradiating. No vomiting, no loose stools. No black stools, no vomiting of blood, no blood in stools. Patient admitsto taking a lot of NSAIDs every day. She also complains of back pain-but this is chronic. Patient complaints also included chest pain that started today, nonradiating, with associated difficulty breathing.She denies prior episodes of chest pain even with activity.She is unable to quantify her chest pain symptoms further.  Daughter reports poor oral intake over the past few days.  11/4:Patient was admitted with intractable epigastric abdominal pain in the setting of frequent NSAID use and is noted to have generalized anxiety disorder as well. She has been seen by GI with plans for EGD in a.m. to evaluate for gastritis or peptic ulcer disease.  11/5: Patient noted to have hypotension overnight and is now oliguric with some AKI noted.  Plan to hold sedating medications and start IV fluid and monitor closely.  Plan for EGD today.  11/6: EGD on 11/5 demonstrated some mild esophagitis as well as marginal ulcer for which sucralfate  and PPI twice daily have been recommended and initiated.  AKI appears to have resolved and patient is more alert and awake and complaining of pain.  Blood pressures are stable.  Certain home pain medications will be resumed today to help with pain management.  PT evaluation requested to determine safety on discharge.  Assessment & Plan:   Principal Problem:   Intractable epigastric abdominal pain Active Problems:   Hypertension   Chronic back pain   Hiatal hernia   Smoker   Generalized anxiety disorder   Epigastric pain   Ventral hernia without obstruction or gangrene   Intractable epigastric abdominal pain-possibly multifactorial -EGD performed 11/5 with marginal ulcer and distal esophagitis; started on treatment with sucralfate and PPI twice daily -May have ventral hernia as part of the problem along with hydropic gallbladder, but no signs of cholecystitis -Resume home pain medications at lower dose  Acute toxic encephalopathy-improving -Likely related to polypharmacy in the setting of AKI -Resume home medications at lower doses as AKI has resolved  AKI-oliguric -Resolved with IV fluid administration -Likely related to some ATN related to hypotension -Continue IV fluid and maintain SBP greater than 90 -Monitor strict I's and O's and daily weights  Acute hypoxemic respiratory failure -Currently on 5 L oxygen, wean as tolerated -Likely multifactorial with narcotic use in the setting of COPD -DuoNebs as needed -Continue nasal cannula supplementation and wean prior to discharge, otherwise may need home oxygen  Anemia-stable -Noted to have hemoglobin in the 11-12 range and is down to 8.5 today -Feraheme given on 11/5 for iron deficiency -No overt bleeding noted, plan to follow CBC in a.m.  Chest pain -Troponin with flat trend -2D echocardiogram with no significant findings LVEF 60-65% and diastolic  dysfunction grade 1 -CT chest on 10/23 with marked severe coronary  artery calcification noted -Continue statin  Generalized anxiety disorder -PCP not prescribing Xanax because patient on opioids -IV Ativan 0.5 mg every 8 hours as needed -May require some antianxiety medications outpatient, will refer to PCP -Hold Seroquel at bedtime  Chronic back pain -Resume lower doses of oxycodone and gabapentin given hypotension and some encephalopathy -PT evaluation requested with possible need for placement  Hypertension -Stabilized -Continue to hold lisinopril and Norvasc -Resuming some sedating medications today due to ongoing pain -Labetalol as needed   DVT prophylaxis:SCDs Code Status:Full code Family Communication: Tried calling daughter 11/5-11/6 with no response Disposition Plan: Status is: Inpatient  Remains inpatient appropriate because:IV treatments appropriate due to intensity of illness or inability to take PO and Inpatient level of care appropriate due to severity of illness   Dispo: The patient is from:Home Anticipated d/c is [facility] vs SNF Anticipated d/c date is: 1-2 days Patient currently is not medically stable to d/c.Patient does not appear to be safe to go back to her usual home environment.  PT evaluation pending.  Consultants:  GI  Procedures:  EGD 11/5  Antimicrobials:   None   Subjective: Patient seen and evaluated today with ongoing pain complaints to her back noted.  Objective: Vitals:   01/17/20 1452 01/17/20 1508 01/17/20 2100 01/18/20 0600  BP:  133/66 (!) 121/59 135/75  Pulse:  88 93 76  Resp:  (!) 24 20 20   Temp: 98.1 F (36.7 C) 98.4 F (36.9 C) 98.8 F (37.1 C) 98.3 F (36.8 C)  TempSrc:  Oral    SpO2:  96% 97% 96%  Weight:      Height:        Intake/Output Summary (Last 24 hours) at 01/18/2020 1354 Last data filed at 01/18/2020 1220 Gross per 24 hour  Intake 1180 ml  Output 1500 ml  Net -320 ml   Filed Weights   01/15/20 2338   Weight: 73.9 kg    Examination:  General exam: Appears to be in mild to moderate distress and complaining of pain Respiratory system: Clear to auscultation. Respiratory effort normal.  Currently on 5 L nasal cannula oxygen. Cardiovascular system: S1 & S2 heard, RRR.  Gastrointestinal system: Abdomen is nondistended, soft and nontender.  Central nervous system: Alert and awake Extremities: No edema Skin: No rashes, lesions or ulcers Psychiatry: Flat affect    Data Reviewed: I have personally reviewed following labs and imaging studies  CBC: Recent Labs  Lab 01/13/20 1301 01/15/20 1928 01/16/20 0501 01/17/20 0658 01/18/20 0657  WBC 9.9 12.8* 8.8 8.3 7.7  NEUTROABS 8.0* 10.8*  --   --   --   HGB 10.2* 11.8* 8.9* 8.8* 8.5*  HCT 35.0* 37.9 30.3* 30.1* 29.0*  MCV 91.9 86.5 89.6 91.8 92.4  PLT 505* 556* 393 393 370   Basic Metabolic Panel: Recent Labs  Lab 01/13/20 1301 01/15/20 1928 01/15/20 2127 01/16/20 0501 01/17/20 0658 01/18/20 0657  NA 135 134*  --  138 136 135  K 4.1 3.0*  --  3.2* 4.7 4.9  CL 104 90*  --  97* 100 101  CO2 26 28  --  31 28 29   GLUCOSE 142* 160*  --  121* 82 91  BUN 17 12  --  16 21 18   CREATININE 0.77 0.89  --  1.22* 1.99* 0.96  CALCIUM 8.3* 8.4*  --  7.8* 7.9* 7.9*  MG  --   --  1.5*  --  2.2 1.9   GFR: Estimated Creatinine Clearance: 45.1 mL/min (by C-G formula based on SCr of 0.96 mg/dL). Liver Function Tests: Recent Labs  Lab 01/13/20 1301 01/15/20 1928 01/17/20 0658  AST 13* 20 10*  ALT 13 15 9   ALKPHOS 78 93 60  BILITOT 0.2* 0.7 0.6  PROT 6.4* 7.7 5.6*  ALBUMIN 3.0* 3.6 2.5*   Recent Labs  Lab 01/13/20 1301 01/15/20 1928  LIPASE 16 20   No results for input(s): AMMONIA in the last 168 hours. Coagulation Profile: No results for input(s): INR, PROTIME in the last 168 hours. Cardiac Enzymes: No results for input(s): CKTOTAL, CKMB, CKMBINDEX, TROPONINI in the last 168 hours. BNP (last 3 results) No results for  input(s): PROBNP in the last 8760 hours. HbA1C: No results for input(s): HGBA1C in the last 72 hours. CBG: No results for input(s): GLUCAP in the last 168 hours. Lipid Profile: No results for input(s): CHOL, HDL, LDLCALC, TRIG, CHOLHDL, LDLDIRECT in the last 72 hours. Thyroid Function Tests: No results for input(s): TSH, T4TOTAL, FREET4, T3FREE, THYROIDAB in the last 72 hours. Anemia Panel: Recent Labs    01/16/20 1757  VITAMINB12 762  FOLATE 28.8  FERRITIN 13  TIBC 292  IRON 19*  RETICCTPCT 1.5   Sepsis Labs: Recent Labs  Lab 01/13/20 1301 01/13/20 1439  LATICACIDVEN 0.5 0.9    Recent Results (from the past 240 hour(s))  Respiratory Panel by RT PCR (Flu A&B, Covid) - Nasopharyngeal Swab     Status: None   Collection Time: 01/15/20  9:18 PM   Specimen: Nasopharyngeal Swab  Result Value Ref Range Status   SARS Coronavirus 2 by RT PCR NEGATIVE NEGATIVE Final    Comment: (NOTE) SARS-CoV-2 target nucleic acids are NOT DETECTED.  The SARS-CoV-2 RNA is generally detectable in upper respiratoy specimens during the acute phase of infection. The lowest concentration of SARS-CoV-2 viral copies this assay can detect is 131 copies/mL. A negative result does not preclude SARS-Cov-2 infection and should not be used as the sole basis for treatment or other patient management decisions. A negative result may occur with  improper specimen collection/handling, submission of specimen other than nasopharyngeal swab, presence of viral mutation(s) within the areas targeted by this assay, and inadequate number of viral copies (<131 copies/mL). A negative result must be combined with clinical observations, patient history, and epidemiological information. The expected result is Negative.  Fact Sheet for Patients:  13/03/21  Fact Sheet for Healthcare Providers:  https://www.moore.com/  This test is no t yet approved or cleared by the  https://www.young.biz/ FDA and  has been authorized for detection and/or diagnosis of SARS-CoV-2 by FDA under an Emergency Use Authorization (EUA). This EUA will remain  in effect (meaning this test can be used) for the duration of the COVID-19 declaration under Section 564(b)(1) of the Act, 21 U.S.C. section 360bbb-3(b)(1), unless the authorization is terminated or revoked sooner.     Influenza A by PCR NEGATIVE NEGATIVE Final   Influenza B by PCR NEGATIVE NEGATIVE Final    Comment: (NOTE) The Xpert Xpress SARS-CoV-2/FLU/RSV assay is intended as an aid in  the diagnosis of influenza from Nasopharyngeal swab specimens and  should not be used as a sole basis for treatment. Nasal washings and  aspirates are unacceptable for Xpert Xpress SARS-CoV-2/FLU/RSV  testing.  Fact Sheet for Patients: [place]  Fact Sheet for Healthcare Providers: https://www.moore.com/  This test is not yet approved or cleared by the https://www.young.biz/ and  has been authorized  for detection and/or diagnosis of SARS-CoV-2 by  FDA under an Emergency Use Authorization (EUA). This EUA will remain  in effect (meaning this test can be used) for the duration of the  Covid-19 declaration under Section 564(b)(1) of the Act, 21  U.S.C. section 360bbb-3(b)(1), unless the authorization is  terminated or revoked. Performed at [facility], [address] [place], [place] [address]          Radiology Studies: No results found.      Scheduled Meds:  atorvastatin  20 mg Oral QHS   gabapentin  600 mg Oral TID   oxyCODONE  15 mg Oral BID   pantoprazole (PROTONIX) IV  40 mg Intravenous Q12H   potassium chloride  40 mEq Oral BID   rOPINIRole  2 mg Oral QHS   sucralfate  1 g Oral BID   Continuous Infusions:  ferumoxytol     lactated ringers 100 mL/hr at 01/17/20 0848     LOS: 2 days    Time spent: 30 minutes    Roniyah Llorens Hoover Brunette, DO [facility]  If 7PM-7AM, please contact night-coverage [email or web address] 01/18/2020, 1:54 PM

## 2020-01-19 LAB — BASIC METABOLIC PANEL
Anion gap: 7 (ref 5–15)
BUN: 13 mg/dL (ref 8–23)
CO2: 30 mmol/L (ref 22–32)
Calcium: 8.2 mg/dL — ABNORMAL LOW (ref 8.9–10.3)
Chloride: 100 mmol/L (ref 98–111)
Creatinine, Ser: 0.85 mg/dL (ref 0.44–1.00)
GFR, Estimated: >60 mL/min (ref >60)
Glucose, Bld: 194 mg/dL — ABNORMAL HIGH (ref 70–99)
Potassium: 4.4 mmol/L (ref 3.5–5.1)
Sodium: 137 mmol/L (ref 135–145)

## 2020-01-19 LAB — CBC
HCT: 32.9 % — ABNORMAL LOW (ref 36.0–46.0)
Hemoglobin: 9.4 g/dL — ABNORMAL LOW (ref 12.0–15.0)
MCH: 26.3 pg (ref 26.0–34.0)
MCHC: 28.6 g/dL — ABNORMAL LOW (ref 30.0–36.0)
MCV: 92.2 fL (ref 80.0–100.0)
Platelets: 413 10*3/uL — ABNORMAL HIGH (ref 150–400)
RBC: 3.57 MIL/uL — ABNORMAL LOW (ref 3.87–5.11)
RDW: 15.9 % — ABNORMAL HIGH (ref 11.5–15.5)
WBC: 8.1 10*3/uL (ref 4.0–10.5)
nRBC: 0 % (ref 0.0–0.2)

## 2020-01-19 LAB — MAGNESIUM: Magnesium: 1.5 mg/dL — ABNORMAL LOW (ref 1.7–2.4)

## 2020-01-19 MED ORDER — SUCRALFATE 1 G PO TABS: 1.0000 g | ORAL_TABLET | Freq: Two times a day (BID) | ORAL | 0 refills | Status: DC

## 2020-01-19 MED ORDER — ACETAMINOPHEN 325 MG PO TABS: 650.0000 mg | ORAL_TABLET | Freq: Four times a day (QID) | ORAL | 2 refills | Status: AC | PRN

## 2020-01-19 MED ORDER — PANTOPRAZOLE SODIUM 40 MG PO TBEC: 40.0000 mg | DELAYED_RELEASE_TABLET | Freq: Two times a day (BID) | ORAL | 0 refills | Status: DC

## 2020-01-19 NOTE — Progress Notes (Signed)
Nsg Discharge Note  Admit Date:  01/15/2020 Discharge date: 01/19/2020   Brenda Hopkins to be D/C'd Home  per MD order.  AVS completed.  Patient able to verbalize understanding.  Discharge Medication: Allergies as of 01/19/2020      Reactions   Prozac [fluoxetine Hcl] Other (See Comments)   Makes her fell crazy   Zoloft [sertraline Hcl]    Made her feel crazy--10/2013   Nicoderm [nicotine] Rash   Site rash      Medication List    STOP taking these medications   ALPRAZolam 0.5 MG tablet Commonly known as: XANAX   amoxicillin-clavulanate 875-125 MG tablet Commonly known as: AUGMENTIN   celecoxib 100 MG capsule Commonly known as: CELEBREX   INFANTS IBUPROFEN 50 PO   tiZANidine 4 MG tablet Commonly known as: ZANAFLEX   traZODone 50 MG tablet Commonly known as: DESYREL     TAKE these medications   acetaminophen 325 MG tablet Commonly known as: TYLENOL Take 2 tablets (650 mg total) by mouth every 6 (six) hours as needed for mild pain, moderate pain or headache (or Fever >/= 101).   amLODipine 10 MG tablet Commonly known as: NORVASC TAKE 1 TABLET BY MOUTH EVERY DAY   atorvastatin 20 MG tablet Commonly known as: LIPITOR TAKE 1 TABLET BY MOUTH EVERY NIGHT AT BEDTIME   BENGAY PAIN RELIEF + MASSAGE EX Apply 1 application topically daily as needed.   diphenoxylate-atropine 2.5-0.025 MG tablet Commonly known as: Lomotil Take 1 tablet by mouth 4 (four) times daily as needed for diarrhea or loose stools.   gabapentin 600 MG tablet Commonly known as: NEURONTIN TAKE 1 TABLET BY MOUTH THREE TIMES DAILY   lisinopril 10 MG tablet Commonly known as: ZESTRIL Take 10 mg by mouth daily.   ondansetron 4 MG tablet Commonly known as: ZOFRAN Take 1 tablet (4 mg total) by mouth every 6 (six) hours.   oxyCODONE 15 MG immediate release tablet Commonly known as: ROXICODONE Take 15 mg by mouth 4 (four) times daily.   pantoprazole 40 MG tablet Commonly known as: PROTONIX TAKE 1  TABLET BY MOUTH EACH DAY What changed:   See the new instructions.  Another medication with the same name was removed. Continue taking this medication, and follow the directions you see here.   pantoprazole 40 MG tablet Commonly known as: Protonix Take 1 tablet (40 mg total) by mouth 2 (two) times daily. What changed:   when to take this  Another medication with the same name was removed. Continue taking this medication, and follow the directions you see here.   potassium chloride SA 20 MEQ tablet Commonly known as: KLOR-CON Take 20 mEq by mouth 2 (two) times daily.   QUEtiapine 200 MG tablet Commonly known as: SEROQUEL Take 200 mg by mouth at bedtime.   rOPINIRole 3 MG tablet Commonly known as: REQUIP Take 3 mg by mouth at bedtime.   sucralfate 1 g tablet Commonly known as: CARAFATE Take 1 tablet (1 g total) by mouth 2 (two) times daily.   trolamine salicylate 10 % cream Commonly known as: ASPERCREME Apply 1 application topically as needed for muscle pain.       Discharge Assessment: Vitals:   01/19/20 0433 01/19/20 1421  BP: (!) 161/90 135/74  Pulse: 95 92  Resp: 20 20  Temp: 98.8 F (37.1 C) (!) 97 F (36.1 C)  SpO2: 95% 95%   Skin clean, dry and intact without evidence of skin break down, no evidence of skin tears  noted. IV catheter discontinued intact. Site without signs and symptoms of complications - no redness or edema noted at insertion site, patient denies c/o pain - only slight tenderness at site.  Dressing with slight pressure applied.  D/c Instructions-Education: Discharge instructions given to patient/family with verbalized understanding. D/c education completed with patient/family including follow up instructions, medication list, d/c activities limitations if indicated, with other d/c instructions as indicated by MD - patient able to verbalize understanding, all questions fully answered. Patient instructed to return to ED, call 911, or call MD  for any changes in condition.  Patient escorted via WC, and D/C home via private auto.  Jethro Poling, RN 01/19/2020 3:29 PM

## 2020-01-19 NOTE — Discharge Summary (Signed)
Physician Discharge Summary  Brenda Hopkins NFA:[id number] DOB: 10/07/1940 DOA: 01/15/2020  PCP: Shawnie Dapper, PA-C  Admit date: 01/15/2020  Discharge date: 01/19/2020  Admitted From:Home  Disposition:  Home  Recommendations for Outpatient Follow-up:  1. Follow up with PCP in 1-2 weeks 2. Continue on current home medications as prior and work on weaning narcotic and other pain medications to reduce polypharmacy with PCP.  Discussed with daughter. 3. Continue on Protonix 40 mg twice daily as recommended by GI with follow-up for EGD in 3 months that will be scheduled. 4. Avoid further NSAIDs and use Tylenol instead. 5. Continue on sucralfate for 1 month as prescribed below  Home Health: Yes with PT  Equipment/Devices: None  Discharge Condition: Stable  CODE STATUS: Full  Diet recommendation: Heart Healthy  Brief/Interim Summary: Per HPI: Brenda Hopkins a 79 y.o.femalewith medical history significant forhypertension, anxiety,chronic pain.Patient presented to the ED with complaints of persistent abdominal pain started at least 2 weeks ago. Patient was in the ED 10/23 for same abdominal pain, CTA chest abdomen and pelvis showed multiple acute diverticulitis, and marked severity coronary artery calcification. Patient was discharged home, with Augmentin, but patient states the same abdominal pain has persisted till today.  Patient was in the ED again, 2 days ago,- 01/13/2020,with some abdominal pain. CT abdominal pelvis without acute diverticulitis.,Chronic T9 compression fracture.   Patient today complains of pain in upper abdomen, nonradiating. No vomiting, no loose stools. No black stools, no vomiting of blood, no blood in stools. Patient admitsto taking a lot of NSAIDs every day. She also complains of back pain-but this is chronic. Patient complaints also included chest pain that started today, nonradiating, with associated difficulty breathing.She denies prior  episodes of chest pain even with activity.She is unable to quantify her chest pain symptoms further.  Daughter reports poor oral intake over the past few days.  -Patient was admitted for evaluation of intractable epigastric abdominal pain in the setting of frequent NSAID use.  She had undergone EGD evaluation for suspicion of gastritis and peptic ulcer disease with findings of mild esophagitis as well as marginal ulcer for which sucralfate and PPI twice daily was recommended and has been initiated.  She will need further follow-up in the outpatient setting in the next 3 months for repeat EGD for documentation of healing.  She was noted to have AKI during this admission with some dehydration and hypotension.  Much of this appears to be related to her polypharmacy with multiple home pain medications.  I have discussed this with the daughter who states that she will follow up with PCP regarding the need for weaning of these medications as she is at high risk for overdose.  She is to continue on her medications as otherwise prescribed and will have some home health physical therapy to assist her at home as she ambulates with walker and appears to be a higher fall risk.  She refuses to consider going to SNF.  Discharge Diagnoses:  Principal Problem:   Intractable epigastric abdominal pain Active Problems:   Hypertension   Chronic back pain   Hiatal hernia   Smoker   Generalized anxiety disorder   Epigastric pain   Ventral hernia without obstruction or gangrene  Principal discharge diagnosis: Intractable epigastric abdominal pain related to persistent NSAID use with noted distal esophagitis and marginal ulcer.  Discharge Instructions  Discharge Instructions    Diet - low sodium heart healthy   Complete by: As directed    Increase activity slowly  Complete by: As directed      Allergies as of 01/19/2020      Reactions   Prozac [fluoxetine Hcl] Other (See Comments)   Makes her fell crazy    Zoloft [sertraline Hcl]    Made her feel crazy--10/2013   Nicoderm [nicotine] Rash   Site rash      Medication List    STOP taking these medications   ALPRAZolam 0.5 MG tablet Commonly known as: XANAX   amoxicillin-clavulanate 875-125 MG tablet Commonly known as: AUGMENTIN   celecoxib 100 MG capsule Commonly known as: CELEBREX   INFANTS IBUPROFEN 50 PO   tiZANidine 4 MG tablet Commonly known as: ZANAFLEX   traZODone 50 MG tablet Commonly known as: DESYREL     TAKE these medications   acetaminophen 325 MG tablet Commonly known as: TYLENOL Take 2 tablets (650 mg total) by mouth every 6 (six) hours as needed for mild pain, moderate pain or headache (or Fever >/= 101).   amLODipine 10 MG tablet Commonly known as: NORVASC TAKE 1 TABLET BY MOUTH EVERY DAY   atorvastatin 20 MG tablet Commonly known as: LIPITOR TAKE 1 TABLET BY MOUTH EVERY NIGHT AT BEDTIME   BENGAY PAIN RELIEF + MASSAGE EX Apply 1 application topically daily as needed.   diphenoxylate-atropine 2.5-0.025 MG tablet Commonly known as: Lomotil Take 1 tablet by mouth 4 (four) times daily as needed for diarrhea or loose stools.   gabapentin 600 MG tablet Commonly known as: NEURONTIN TAKE 1 TABLET BY MOUTH THREE TIMES DAILY   lisinopril 10 MG tablet Commonly known as: ZESTRIL Take 10 mg by mouth daily.   ondansetron 4 MG tablet Commonly known as: ZOFRAN Take 1 tablet (4 mg total) by mouth every 6 (six) hours.   oxyCODONE 15 MG immediate release tablet Commonly known as: ROXICODONE Take 15 mg by mouth 4 (four) times daily.   pantoprazole 40 MG tablet Commonly known as: PROTONIX TAKE 1 TABLET BY MOUTH EACH DAY What changed:   See the new instructions.  Another medication with the same name was removed. Continue taking this medication, and follow the directions you see here.   pantoprazole 40 MG tablet Commonly known as: Protonix Take 1 tablet (40 mg total) by mouth 2 (two) times daily. What  changed:   when to take this  Another medication with the same name was removed. Continue taking this medication, and follow the directions you see here.   potassium chloride SA 20 MEQ tablet Commonly known as: KLOR-CON Take 20 mEq by mouth 2 (two) times daily.   QUEtiapine 200 MG tablet Commonly known as: SEROQUEL Take 200 mg by mouth at bedtime.   rOPINIRole 3 MG tablet Commonly known as: REQUIP Take 3 mg by mouth at bedtime.   sucralfate 1 g tablet Commonly known as: CARAFATE Take 1 tablet (1 g total) by mouth 2 (two) times daily.   trolamine salicylate 10 % cream Commonly known as: ASPERCREME Apply 1 application topically as needed for muscle pain.       Follow-up Information    Shawnie Dapper, PA-C. Schedule an appointment as soon as possible for a visit in 1 week(s).   Specialties: Physician Assistant, Internal Medicine Contact information: [address] [place] [address] [phone]              Allergies  Allergen Reactions  . Prozac [Fluoxetine Hcl] Other (See Comments)    Makes her fell crazy  . Zoloft [Sertraline Hcl]  Made her feel crazy--10/2013  . Nicoderm [Nicotine] Rash    Site rash    Consultations:  GI   Procedures/Studies: CT ABDOMEN PELVIS W CONTRAST  Result Date: 01/13/2020 CLINICAL DATA:  Suspected diverticulitis in a patient with worsening abdominal pain. Also presenting with lower chest pain that been going on for the past 6 days. Dull constant pain. EXAM: CT ABDOMEN AND PELVIS WITH CONTRAST TECHNIQUE: Multidetector CT imaging of the abdomen and pelvis was performed using the standard protocol following bolus administration of intravenous contrast. CONTRAST:  OMNIPAQUE IOHEXOL 300 MG/ML  SOLN COMPARISON:  CT angiography 01/04/2020 chest abdomen pelvis. FINDINGS: Lower chest: No focal consolidation within the visualized lungs. Severe four-vessel coronary artery calcifications. At least mild mitral annular  calcifications. Small hiatal hernia. Hepatobiliary: Hepatic steatosis. No focal liver abnormality. Nonspecific hydropic gallbladder measuring up to 6 cm on axial imaging. Otherwise no gallstones, gallbladder wall thickening, or pericholecystic fluid. No biliary dilatation. Pancreas: Diffusely atrophic. No focal lesion. Otherwise normal pancreatic contour. No surrounding inflammatory changes. No main pancreatic ductal dilatation. Spleen: Normal in size without focal abnormality. Adrenals/Urinary Tract: Hyperplastic right adrenal gland. Slightly hyperplastic left adrenal gland. No adrenal nodule bilaterally. Bilateral kidneys enhance symmetrically. No hydronephrosis. No hydroureter. The urinary bladder is unremarkable. Stomach/Bowel: Roux-en-Y gastric bypass. Stomach is within normal limits. No evidence of bowel wall thickening or dilatation. Diffuse sigmoid diverticulosis. The appendix is not visualized and likely surgically absent. Vascular/Lymphatic: No abdominal aorta or iliac aneurysm. Severe atherosclerotic plaque of the aorta and its branches. No abdominal, pelvic, or inguinal lymphadenopathy. Reproductive: Uterus and bilateral adnexa are unremarkable. Other: No intraperitoneal free fluid. No intraperitoneal free gas. No organized fluid collection. Musculoskeletal: Moderate sized fat containing supra-umbilical ventral wall hernia with an abdominal defect of 1.2 cm. Subcutaneus soft tissue edema along the midline lower back at the L5-S1 level. No organized fluid collection. Diffusely decreased bone density. No suspicious lytic or blastic osseous lesions. No acute displaced fracture. Multilevel degenerative changes of the spine. Posterolateral fusion at the L5-S1 level with similar-appearing grade 2 anterolisthesis of L5 on S1. Compression fracture of the T9 vertebral body that appears similar to prior with complete height loss centrally. Old superior and inferior right pubic rami fractures. IMPRESSION: 1.  Diffuse sigmoid diverticulosis with no acute diverticulitis. 2. Small hiatal hernia. 3. Moderate sized fat containing supra-umbilical ventral wall hernia with an abdominal defect of 1.2 cm. No findings suggest ischemia or bowel obstruction. 4. Hepatic steatosis. 5. Aortic Atherosclerosis (ICD10-I70.0) as well as severe coronary artery calcifications. 6. Chronic T9 compression fracture with 100% vertebral body height loss centrally. 7. Status post L5-S1 posterolateral fusion with stable grade 2 anterolisthesis at this level. Limited evaluation of central canal stenosis due to streak artifact originating from the surgical hardware. Electronically Signed   By: Tish Frederickson M.D.   On: 01/13/2020 17:19   DG Chest Port 1 View  Result Date: 01/15/2020 CLINICAL DATA:  Abdominal and back pain, weakness EXAM: PORTABLE CHEST 1 VIEW COMPARISON:  01/13/2020 FINDINGS: Single frontal view of the chest demonstrates a stable cardiac silhouette. Chronic scarring throughout the lungs, most pronounced at the left lung base. No airspace disease, effusion, or pneumothorax. No acute bony abnormalities. IMPRESSION: 1. Stable areas of scarring.  No acute airspace disease. Electronically Signed   By: Sharlet Salina M.D.   On: 01/15/2020 19:55   DG Chest Portable 1 View  Result Date: 01/13/2020 CLINICAL DATA:  Chest pain. EXAM: PORTABLE CHEST 1 VIEW COMPARISON:  CTA chest, abdomen, and  pelvis dated January 04, 2020. FINDINGS: The heart size and mediastinal contours are within normal limits. Atherosclerotic calcification of the aortic arch. Normal pulmonary vascularity. Unchanged bibasilar scarring. No focal consolidation, pleural effusion, or pneumothorax. No acute osseous abnormality. Old right-sided rib fractures again noted. Advanced degenerative changes of the right shoulder. IMPRESSION: No active disease. Electronically Signed   By: Obie Dredge M.D.   On: 01/13/2020 12:33   ECHOCARDIOGRAM COMPLETE  Result Date:  01/16/2020    ECHOCARDIOGRAM REPORT   Patient Name:   Brenda Hopkins Date of Exam: 01/16/2020 Medical Rec #:  [id number]      Height:       61.0 in Accession #:    [id number]     Weight:       162.9 lb Date of Birth:  03/10/1941      BSA:          1.731 m Patient Age:    79 years       BP:           112/66 mmHg Patient Gender: F              HR:           77 bpm. [facility] Procedure: 2D Echo Indications:    Chest Pain 786.50 / R07.9  History:        Patient has no prior history of Echocardiogram examinations.                 Signs/Symptoms:Chest Pain; Risk Factors:Hypertension, Former                 Smoker and Dyslipidemia. GERD.  Sonographer:    Jeryl Columbia RDCS (AE) Referring Phys: [id number] [facility] IMPRESSIONS  1. Left ventricular ejection fraction, by estimation, is 60 to 65%. The left ventricle has normal function. The left ventricle has no regional wall motion abnormalities. There is moderate left ventricular hypertrophy. Left ventricular diastolic parameters are consistent with Grade I diastolic dysfunction (impaired relaxation).  2. Right ventricular systolic function is normal. The right ventricular size is normal.  3. The mitral valve is normal in structure. No evidence of mitral valve regurgitation. No evidence of mitral stenosis.  4. The aortic valve is tricuspid. There is mild calcification of the aortic valve. There is mild thickening of the aortic valve. Aortic valve regurgitation is not visualized. No aortic stenosis is present.  5. The inferior vena cava is normal in size with greater than 50% respiratory variability, suggesting right atrial pressure of 3 mmHg. FINDINGS  Left Ventricle: Left ventricular ejection fraction, by estimation, is 60 to 65%. The left ventricle has normal function. The left ventricle has no regional wall motion abnormalities. The left ventricular internal cavity size was normal in size. There is  moderate left ventricular hypertrophy. Left  ventricular diastolic parameters are consistent with Grade I diastolic dysfunction (impaired relaxation). Normal left ventricular filling pressure. Right Ventricle: The right ventricular size is normal. No increase in right ventricular wall thickness. Right ventricular systolic function is normal. Left Atrium: Left atrial size was normal in size. Right Atrium: Right atrial size was normal in size. Pericardium: There is no evidence of pericardial effusion. Mitral Valve: The mitral valve is normal in structure. There is mild thickening of the mitral valve leaflet(s). There is mild calcification of the mitral valve leaflet(s). Mild mitral annular calcification. No evidence of mitral valve regurgitation. No evidence of mitral valve stenosis. Tricuspid Valve: The tricuspid valve is  normal in structure. Tricuspid valve regurgitation is trivial. No evidence of tricuspid stenosis. Aortic Valve: The aortic valve is tricuspid. There is mild calcification of the aortic valve. There is mild thickening of the aortic valve. There is mild aortic valve annular calcification. Aortic valve regurgitation is not visualized. No aortic stenosis  is present. Aortic valve mean gradient measures 7.0 mmHg. Aortic valve peak gradient measures 12.7 mmHg. Aortic valve area, by VTI measures 2.58 cm. Pulmonic Valve: The pulmonic valve was not well visualized. Pulmonic valve regurgitation is not visualized. No evidence of pulmonic stenosis. Aorta: The aortic root is normal in size and structure. Pulmonary Artery: Indeterminant PASP, inadequate TR jet. Venous: The inferior vena cava is normal in size with greater than 50% respiratory variability, suggesting right atrial pressure of 3 mmHg. IAS/Shunts: No atrial level shunt detected by color flow Doppler.  LEFT VENTRICLE PLAX 2D LVIDd:         4.84 cm  Diastology LVIDs:         2.66 cm  LV e' medial:    10.80 cm/s LV PW:         1.20 cm  LV E/e' medial:  7.3 LV IVS:        1.24 cm  LV e' lateral:    5.98 cm/s LVOT diam:     2.20 cm  LV E/e' lateral: 13.1 LV SV:         93 LV SV Index:   54 LVOT Area:     3.80 cm  RIGHT VENTRICLE RV S prime:     16.30 cm/s TAPSE (M-mode): 1.8 cm LEFT ATRIUM             Index       RIGHT ATRIUM           Index LA diam:        5.00 cm 2.89 cm/m  RA Area:     12.70 cm LA Vol (A2C):   53.4 ml 30.85 ml/m RA Volume:   35.50 ml  20.51 ml/m LA Vol (A4C):   49.3 ml 28.48 ml/m LA Biplane Vol: 51.9 ml 29.98 ml/m  AORTIC VALVE AV Area (Vmax):    2.43 cm AV Area (Vmean):   2.01 cm AV Area (VTI):     2.58 cm AV Vmax:           178.41 cm/s AV Vmean:          126.491 cm/s AV VTI:            0.361 m AV Peak Grad:      12.7 mmHg AV Mean Grad:      7.0 mmHg LVOT Vmax:         113.97 cm/s LVOT Vmean:        66.793 cm/s LVOT VTI:          0.245 m LVOT/AV VTI ratio: 0.68  AORTA Ao Root diam: 2.60 cm MITRAL VALVE                TRICUSPID VALVE MV Area (PHT): 2.33 cm     TR Peak grad:   28.1 mmHg MV Decel Time: 326 msec     TR Vmax:        265.00 cm/s MV E velocity: 78.40 cm/s MV A velocity: 124.00 cm/s  SHUNTS MV E/A ratio:  0.63         Systemic VTI:  0.24 m  Systemic Diam: 2.20 cm Dina RichJonathan Branch MD Electronically signed by Dina RichJonathan Branch MD Signature Date/Time: 01/16/2020/12:37:20 PM    Final    CT Angio Chest/Abd/Pel for Dissection W and/or W/WO  Result Date: 01/04/2020 CLINICAL DATA:  Chest pain and abdominal pain x6 days. EXAM: CT ANGIOGRAPHY CHEST, ABDOMEN AND PELVIS TECHNIQUE: Non-contrast CT of the chest was initially obtained. Multidetector CT imaging through the chest, abdomen and pelvis was performed using the standard protocol during bolus administration of intravenous contrast. Multiplanar reconstructed images and MIPs were obtained and reviewed to evaluate the vascular anatomy. CONTRAST:  100mL OMNIPAQUE IOHEXOL 350 MG/ML SOLN COMPARISON:  None. FINDINGS: CTA CHEST FINDINGS Cardiovascular: There is moderate to marked severity calcification of  the thoracic aorta, without evidence of aneurysmal dilatation. Satisfactory opacification of the pulmonary arteries to the segmental level. No evidence of pulmonary embolism. Normal heart size with marked severity coronary artery calcification. No pericardial effusion. Mediastinum/Nodes: No enlarged mediastinal, hilar, or axillary lymph nodes. Thyroid gland, trachea, and esophagus demonstrate no significant findings. Lungs/Pleura: Mild linear scarring and/or atelectasis is seen within the bilateral lung bases and inferior aspect of the left upper lobe. There is no evidence of a pleural effusion or pneumothorax. Musculoskeletal: A chronic versus congenital appearing linear area is seen involving the body of the sternum. Numerous chronic right-sided rib fractures are seen. A chronic compression fracture deformity is seen involving the T9 vertebral body. Review of the MIP images confirms the above findings. CTA ABDOMEN AND PELVIS FINDINGS VASCULAR Aorta: Marked severity calcification, without aneurysm, dissection, vasculitis or significant stenosis. Celiac: Patent without evidence of aneurysm, dissection, vasculitis or significant stenosis. SMA: Patent without evidence of aneurysm, dissection, vasculitis or significant stenosis. Renals: Both renal arteries are patent without evidence of aneurysm, dissection, vasculitis, fibromuscular dysplasia or significant stenosis. IMA: Patent without evidence of aneurysm, dissection, vasculitis or significant stenosis. Inflow: Marked severity calcification without evidence of aneurysm, dissection, vasculitis or significant stenosis. Veins: No obvious venous abnormality within the limitations of this arterial phase study. Review of the MIP images confirms the above findings. NON-VASCULAR Hepatobiliary: There is mild diffuse fatty infiltration of the liver parenchyma. No focal liver abnormality is seen. The gallbladder is moderately distended without evidence of gallstones,  gallbladder wall thickening, or biliary dilatation. Pancreas: Unremarkable. No pancreatic ductal dilatation or surrounding inflammatory changes. Spleen: Normal in size without focal abnormality. Adrenals/Urinary Tract: There is mild diffuse bilateral adrenal gland enlargement. Kidneys are normal, without renal calculi, focal lesion, or hydronephrosis. A moderate amount of heterogeneous, mildly increased attenuation is seen within the dependent portion of the urinary bladder lumen. Stomach/Bowel: There is a small to moderate sized hiatal hernia. Surgical sutures are seen within the adjacent portion of the gastric region. The appendix is surgically absent. Surgically anastomosed bowel is seen within the anterior aspect of the mid to lower abdomen. No evidence of bowel dilatation. Numerous diverticula are seen throughout the sigmoid colon. Mild thickening of the mid sigmoid colon is seen. Lymphatic: No abnormal abdominal or pelvic lymph nodes are identified. Reproductive: Status post hysterectomy. No adnexal masses. Other: No abdominal wall hernia or abnormality. No abdominopelvic ascites. Musculoskeletal: Chronic fracture deformities are seen involving the right superior and right inferior pubic rami. Bilateral metallic density pedicle screws are seen at the levels of L5 and S1. Approximately 1.1 cm anterolisthesis of the L5 vertebral body is seen on S1. Review of the MIP images confirms the above findings. IMPRESSION: 1. No evidence of aortic dissection or aneurysmal dilatation. 2. Marked severity coronary artery calcification.  3. Fatty liver. 4. Small to moderate sized hiatal hernia. 5. Sigmoid diverticulosis with mild thickening of the mid sigmoid colon which may represent mild acute diverticulitis. 6. Postoperative changes within the abdomen and pelvis. 7. Chronic fracture deformities involving the right superior and right inferior pubic rami. 8. 1.1 cm anterolisthesis of the L5 vertebral body on S1. 9. Aortic  atherosclerosis. Aortic Atherosclerosis (ICD10-I70.0). Electronically Signed   By: Aram Candela M.D.   On: 01/04/2020 22:55     Discharge Exam: Vitals:   01/18/20 2014 01/19/20 0433  BP: (!) 190/84 (!) 161/90  Pulse: 98 95  Resp: 20 20  Temp: 98.8 F (37.1 C) 98.8 F (37.1 C)  SpO2: 97% 95%   Vitals:   01/18/20 0600 01/18/20 1450 01/18/20 2014 01/19/20 0433  BP: 135/75 (!) 155/85 (!) 190/84 (!) 161/90  Pulse: 76 90 98 95  Resp: 20 20 20 20   Temp: 98.3 F (36.8 C) 99.3 F (37.4 C) 98.8 F (37.1 C) 98.8 F (37.1 C)  TempSrc:      SpO2: 96% 98% 97% 95%  Weight:      Height:        General: Pt is alert, awake, not in acute distress Cardiovascular: RRR, S1/S2 +, no rubs, no gallops Respiratory: CTA bilaterally, no wheezing, no rhonchi Abdominal: Soft, NT, ND, bowel sounds + Extremities: no edema, no cyanosis    The results of significant diagnostics from this hospitalization (including imaging, microbiology, ancillary and laboratory) are listed below for reference.     Microbiology: Recent Results (from the past 240 hour(s))  Respiratory Panel by RT PCR (Flu A&B, Covid) - Nasopharyngeal Swab     Status: None   Collection Time: 01/15/20  9:18 PM   Specimen: Nasopharyngeal Swab  Result Value Ref Range Status   SARS Coronavirus 2 by RT PCR NEGATIVE NEGATIVE Final    Comment: (NOTE) SARS-CoV-2 target nucleic acids are NOT DETECTED.  The SARS-CoV-2 RNA is generally detectable in upper respiratoy specimens during the acute phase of infection. The lowest concentration of SARS-CoV-2 viral copies this assay can detect is 131 copies/mL. A negative result does not preclude SARS-Cov-2 infection and should not be used as the sole basis for treatment or other patient management decisions. A negative result may occur with  improper specimen collection/handling, submission of specimen other than nasopharyngeal swab, presence of viral mutation(s) within the areas targeted  by this assay, and inadequate number of viral copies (<131 copies/mL). A negative result must be combined with clinical observations, patient history, and epidemiological information. The expected result is Negative.  Fact Sheet for Patients:  13/03/21  Fact Sheet for Healthcare Providers:  https://www.moore.com/  This test is no t yet approved or cleared by the https://www.young.biz/ FDA and  has been authorized for detection and/or diagnosis of SARS-CoV-2 by FDA under an Emergency Use Authorization (EUA). This EUA will remain  in effect (meaning this test can be used) for the duration of the COVID-19 declaration under Section 564(b)(1) of the Act, 21 U.S.C. section 360bbb-3(b)(1), unless the authorization is terminated or revoked sooner.     Influenza A by PCR NEGATIVE NEGATIVE Final   Influenza B by PCR NEGATIVE NEGATIVE Final    Comment: (NOTE) The Xpert Xpress SARS-CoV-2/FLU/RSV assay is intended as an aid in  the diagnosis of influenza from Nasopharyngeal swab specimens and  should not be used as a sole basis for treatment. Nasal washings and  aspirates are unacceptable for Xpert Xpress SARS-CoV-2/FLU/RSV  testing.  Fact  Sheet for Patients: https://www.moore.com/  Fact Sheet for Healthcare Providers: https://www.young.biz/  This test is not yet approved or cleared by the [place] FDA and  has been authorized for detection and/or diagnosis of SARS-CoV-2 by  FDA under an Emergency Use Authorization (EUA). This EUA will remain  in effect (meaning this test can be used) for the duration of the  Covid-19 declaration under Section 564(b)(1) of the Act, 21  U.S.C. section 360bbb-3(b)(1), unless the authorization is  terminated or revoked. Performed at [facility], [address] [place], [place] [address]      Labs: BNP (last 3 results) Recent Labs    01/15/20 1929  BNP 324.0*    Basic Metabolic Panel: Recent Labs  Lab 01/15/20 1928 01/15/20 2127 01/16/20 0501 01/17/20 0658 01/18/20 0657 01/19/20 0530  NA 134*  --  138 136 135 137  K 3.0*  --  3.2* 4.7 4.9 4.4  CL 90*  --  97* 100 101 100  CO2 28  --  31 28 29 30   GLUCOSE 160*  --  121* 82 91 194*  BUN 12  --  16 21 18 13   CREATININE 0.89  --  1.22* 1.99* 0.96 0.85  CALCIUM 8.4*  --  7.8* 7.9* 7.9* 8.2*  MG  --  1.5*  --  2.2 1.9 1.5*   Liver Function Tests: Recent Labs  Lab 01/13/20 1301 01/15/20 1928 01/17/20 0658  AST 13* 20 10*  ALT 13 15 9   ALKPHOS 78 93 60  BILITOT 0.2* 0.7 0.6  PROT 6.4* 7.7 5.6*  ALBUMIN 3.0* 3.6 2.5*   Recent Labs  Lab 01/13/20 1301 01/15/20 1928  LIPASE 16 20   No results for input(s): AMMONIA in the last 168 hours. CBC: Recent Labs  Lab 01/13/20 1301 01/13/20 1301 01/15/20 1928 01/16/20 0501 01/17/20 0658 01/18/20 0657 01/19/20 0530  WBC 9.9   < > 12.8* 8.8 8.3 7.7 8.1  NEUTROABS 8.0*  --  10.8*  --   --   --   --   HGB 10.2*   < > 11.8* 8.9* 8.8* 8.5* 9.4*  HCT 35.0*   < > 37.9 30.3* 30.1* 29.0* 32.9*  MCV 91.9   < > 86.5 89.6 91.8 92.4 92.2  PLT 505*   < > 556* 393 393 370 413*   < > = values in this interval not displayed.   Cardiac Enzymes: No results for input(s): CKTOTAL, CKMB, CKMBINDEX, TROPONINI in the last 168 hours. BNP: Invalid input(s): POCBNP CBG: No results for input(s): GLUCAP in the last 168 hours. D-Dimer No results for input(s): DDIMER in the last 72 hours. Hgb A1c No results for input(s): HGBA1C in the last 72 hours. Lipid Profile No results for input(s): CHOL, HDL, LDLCALC, TRIG, CHOLHDL, LDLDIRECT in the last 72 hours. Thyroid function studies No results for input(s): TSH, T4TOTAL, T3FREE, THYROIDAB in the last 72 hours.  Invalid input(s): FREET3 Anemia work up Recent Labs    01/16/20 1757  VITAMINB12 762  FOLATE 28.8  FERRITIN 13  TIBC 292  IRON 19*  RETICCTPCT 1.5   Urinalysis    Component Value  Date/Time   COLORURINE YELLOW 01/13/2020 1157   APPEARANCEUR CLEAR 01/13/2020 1157   LABSPEC 1.044 (H) 01/13/2020 1157   PHURINE 6.0 01/13/2020 1157   GLUCOSEU NEGATIVE 01/13/2020 1157   HGBUR NEGATIVE 01/13/2020 1157   BILIRUBINUR NEGATIVE 01/13/2020 1157   KETONESUR NEGATIVE 01/13/2020 1157   PROTEINUR NEGATIVE 01/13/2020 1157   NITRITE NEGATIVE 01/13/2020  1157   LEUKOCYTESUR NEGATIVE 01/13/2020 1157   Sepsis Labs Invalid input(s): PROCALCITONIN,  WBC,  LACTICIDVEN Microbiology Recent Results (from the past 240 hour(s))  Respiratory Panel by RT PCR (Flu A&B, Covid) - Nasopharyngeal Swab     Status: None   Collection Time: 01/15/20  9:18 PM   Specimen: Nasopharyngeal Swab  Result Value Ref Range Status   SARS Coronavirus 2 by RT PCR NEGATIVE NEGATIVE Final    Comment: (NOTE) SARS-CoV-2 target nucleic acids are NOT DETECTED.  The SARS-CoV-2 RNA is generally detectable in upper respiratoy specimens during the acute phase of infection. The lowest concentration of SARS-CoV-2 viral copies this assay can detect is 131 copies/mL. A negative result does not preclude SARS-Cov-2 infection and should not be used as the sole basis for treatment or other patient management decisions. A negative result may occur with  improper specimen collection/handling, submission of specimen other than nasopharyngeal swab, presence of viral mutation(s) within the areas targeted by this assay, and inadequate number of viral copies (<131 copies/mL). A negative result must be combined with clinical observations, patient history, and epidemiological information. The expected result is Negative.  Fact Sheet for Patients:  https://www.moore.com/  Fact Sheet for Healthcare Providers:  https://www.young.biz/  This test is no t yet approved or cleared by the [place] FDA and  has been authorized for detection and/or diagnosis of SARS-CoV-2 by FDA under an  Emergency Use Authorization (EUA). This EUA will remain  in effect (meaning this test can be used) for the duration of the COVID-19 declaration under Section 564(b)(1) of the Act, 21 U.S.C. section 360bbb-3(b)(1), unless the authorization is terminated or revoked sooner.     Influenza A by PCR NEGATIVE NEGATIVE Final   Influenza B by PCR NEGATIVE NEGATIVE Final    Comment: (NOTE) The Xpert Xpress SARS-CoV-2/FLU/RSV assay is intended as an aid in  the diagnosis of influenza from Nasopharyngeal swab specimens and  should not be used as a sole basis for treatment. Nasal washings and  aspirates are unacceptable for Xpert Xpress SARS-CoV-2/FLU/RSV  testing.  Fact Sheet for Patients: https://www.moore.com/  Fact Sheet for Healthcare Providers: https://www.young.biz/  This test is not yet approved or cleared by the [place] FDA and  has been authorized for detection and/or diagnosis of SARS-CoV-2 by  FDA under an Emergency Use Authorization (EUA). This EUA will remain  in effect (meaning this test can be used) for the duration of the  Covid-19 declaration under Section 564(b)(1) of the Act, 21  U.S.C. section 360bbb-3(b)(1), unless the authorization is  terminated or revoked. Performed at [facility], [address], [place], [place] [address]      Time coordinating discharge:  35 minutes  SIGNED:   Erick Blinks, DO [facility] 01/19/2020, 12:17 PM  If 7PM-7AM, please contact night-coverage [email or web address]

## 2020-01-19 NOTE — Progress Notes (Signed)
Patient removed IV attempting to go to bathroom unassisted.  Attempted to restart iv, patient refused at this time, stating she did not want an IV anymore.

## 2020-01-22 ENCOUNTER — Encounter (HOSPITAL_COMMUNITY): Payer: Self-pay | Admitting: Gastroenterology

## 2020-01-29 ENCOUNTER — Observation Stay (HOSPITAL_COMMUNITY)
Admission: EM | Admit: 2020-01-29 | Discharge: 2020-01-31 | Disposition: A | Discharge status: Home / Self Care | Payer: Medicare Other | Attending: Family Medicine | Admitting: Family Medicine

## 2020-01-29 ENCOUNTER — Encounter (HOSPITAL_COMMUNITY): Payer: Self-pay | Admitting: Emergency Medicine

## 2020-01-29 ENCOUNTER — Emergency Department (HOSPITAL_COMMUNITY): Payer: Medicare Other

## 2020-01-29 ENCOUNTER — Other Ambulatory Visit: Payer: Self-pay

## 2020-01-29 DIAGNOSIS — Z20822 Contact with and (suspected) exposure to covid-19: Secondary | ICD-10-CM | POA: Insufficient documentation

## 2020-01-29 DIAGNOSIS — Z9114 Patient's other noncompliance with medication regimen: Secondary | ICD-10-CM

## 2020-01-29 DIAGNOSIS — R000 Tachycardia, unspecified: Secondary | ICD-10-CM | POA: Diagnosis not present

## 2020-01-29 DIAGNOSIS — N179 Acute kidney failure, unspecified: Secondary | ICD-10-CM | POA: Diagnosis not present

## 2020-01-29 DIAGNOSIS — F17290 Nicotine dependence, other tobacco product, uncomplicated: Secondary | ICD-10-CM | POA: Diagnosis not present

## 2020-01-29 DIAGNOSIS — Z79899 Other long term (current) drug therapy: Secondary | ICD-10-CM | POA: Insufficient documentation

## 2020-01-29 DIAGNOSIS — K289 Gastrojejunal ulcer, unspecified as acute or chronic, without hemorrhage or perforation: Principal | ICD-10-CM | POA: Insufficient documentation

## 2020-01-29 DIAGNOSIS — E785 Hyperlipidemia, unspecified: Secondary | ICD-10-CM | POA: Diagnosis present

## 2020-01-29 DIAGNOSIS — E8809 Other disorders of plasma-protein metabolism, not elsewhere classified: Secondary | ICD-10-CM

## 2020-01-29 DIAGNOSIS — Z9089 Acquired absence of other organs: Secondary | ICD-10-CM | POA: Insufficient documentation

## 2020-01-29 DIAGNOSIS — J9601 Acute respiratory failure with hypoxia: Secondary | ICD-10-CM

## 2020-01-29 DIAGNOSIS — K219 Gastro-esophageal reflux disease without esophagitis: Secondary | ICD-10-CM | POA: Insufficient documentation

## 2020-01-29 DIAGNOSIS — R1013 Epigastric pain: Secondary | ICD-10-CM | POA: Diagnosis present

## 2020-01-29 DIAGNOSIS — Z90710 Acquired absence of both cervix and uterus: Secondary | ICD-10-CM | POA: Insufficient documentation

## 2020-01-29 DIAGNOSIS — K449 Diaphragmatic hernia without obstruction or gangrene: Secondary | ICD-10-CM

## 2020-01-29 DIAGNOSIS — E860 Dehydration: Secondary | ICD-10-CM | POA: Insufficient documentation

## 2020-01-29 DIAGNOSIS — R110 Nausea: Secondary | ICD-10-CM

## 2020-01-29 DIAGNOSIS — F411 Generalized anxiety disorder: Secondary | ICD-10-CM | POA: Diagnosis present

## 2020-01-29 DIAGNOSIS — F17200 Nicotine dependence, unspecified, uncomplicated: Secondary | ICD-10-CM | POA: Diagnosis present

## 2020-01-29 DIAGNOSIS — I10 Essential (primary) hypertension: Secondary | ICD-10-CM | POA: Diagnosis not present

## 2020-01-29 DIAGNOSIS — G8929 Other chronic pain: Secondary | ICD-10-CM | POA: Diagnosis present

## 2020-01-29 DIAGNOSIS — E669 Obesity, unspecified: Secondary | ICD-10-CM

## 2020-01-29 DIAGNOSIS — R1084 Generalized abdominal pain: Secondary | ICD-10-CM

## 2020-01-29 LAB — COMPREHENSIVE METABOLIC PANEL
ALT: 10 U/L (ref 0–44)
AST: 11 U/L — ABNORMAL LOW (ref 15–41)
Albumin: 3.2 g/dL — ABNORMAL LOW (ref 3.5–5.0)
Alkaline Phosphatase: 101 U/L (ref 38–126)
Anion gap: 10 (ref 5–15)
BUN: 29 mg/dL — ABNORMAL HIGH (ref 8–23)
CO2: 32 mmol/L (ref 22–32)
Calcium: 10 mg/dL (ref 8.9–10.3)
Chloride: 89 mmol/L — ABNORMAL LOW (ref 98–111)
Creatinine, Ser: 1.75 mg/dL — ABNORMAL HIGH (ref 0.44–1.00)
GFR, Estimated: 30 mL/min — ABNORMAL LOW (ref >60)
Glucose, Bld: 149 mg/dL — ABNORMAL HIGH (ref 70–99)
Potassium: 3.7 mmol/L (ref 3.5–5.1)
Sodium: 131 mmol/L — ABNORMAL LOW (ref 135–145)
Total Bilirubin: 0.7 mg/dL (ref 0.3–1.2)
Total Protein: 7.5 g/dL (ref 6.5–8.1)

## 2020-01-29 LAB — URINALYSIS, ROUTINE W REFLEX MICROSCOPIC
Bacteria, UA: NONE SEEN
Glucose, UA: NEGATIVE mg/dL
Hgb urine dipstick: NEGATIVE
Ketones, ur: NEGATIVE mg/dL
Nitrite: NEGATIVE
Protein, ur: 30 mg/dL — AB
Specific Gravity, Urine: 1.025 (ref 1.005–1.030)
pH: 5 (ref 5.0–8.0)

## 2020-01-29 LAB — CBC
HCT: 36 % (ref 36.0–46.0)
Hemoglobin: 11 g/dL — ABNORMAL LOW (ref 12.0–15.0)
MCH: 26.9 pg (ref 26.0–34.0)
MCHC: 30.6 g/dL (ref 30.0–36.0)
MCV: 88 fL (ref 80.0–100.0)
Platelets: 474 10*3/uL — ABNORMAL HIGH (ref 150–400)
RBC: 4.09 MIL/uL (ref 3.87–5.11)
RDW: 15.9 % — ABNORMAL HIGH (ref 11.5–15.5)
WBC: 12.9 10*3/uL — ABNORMAL HIGH (ref 4.0–10.5)
nRBC: 0 % (ref 0.0–0.2)

## 2020-01-29 LAB — LIPASE, BLOOD: Lipase: 18 U/L (ref 11–51)

## 2020-01-29 LAB — TROPONIN I (HIGH SENSITIVITY): Troponin I (High Sensitivity): 18 ng/L — ABNORMAL HIGH (ref <18)

## 2020-01-29 MED ORDER — SODIUM CHLORIDE 0.9 % IV SOLN
80.0000 mg | Freq: Once | INTRAVENOUS | Status: AC
  Administered 2020-01-30: 80 mg via INTRAVENOUS
  Filled 2020-01-29: qty 80

## 2020-01-29 MED ORDER — SODIUM CHLORIDE 0.9 % IV BOLUS
1000.0000 mL | Freq: Once | INTRAVENOUS | Status: AC
  Administered 2020-01-29: 1000 mL via INTRAVENOUS

## 2020-01-29 MED ORDER — ONDANSETRON HCL 4 MG/2ML IJ SOLN
4.0000 mg | Freq: Once | INTRAMUSCULAR | Status: AC
  Administered 2020-01-29: 4 mg via INTRAVENOUS
  Filled 2020-01-29: qty 2

## 2020-01-29 MED ORDER — HYDROMORPHONE HCL 1 MG/ML IJ SOLN
0.5000 mg | Freq: Once | INTRAMUSCULAR | Status: AC
  Administered 2020-01-29: 0.5 mg via INTRAVENOUS
  Filled 2020-01-29: qty 1

## 2020-01-29 NOTE — ED Triage Notes (Signed)
Pt c/o stomach ulcers with acute abdominal pain. Pt states she has not taken any medication.

## 2020-01-29 NOTE — ED Provider Notes (Signed)
[facility] EMERGENCY DEPARTMENT Provider Note   CSN: [id number] Arrival date & time: 01/29/20  1637     History Chief Complaint  Patient presents with  . Abdominal Pain    Brenda Hopkins is a 79 y.o. female.  Patient c/o epigastric pain for the past couple days. Symptoms acute onset, moderate, constant, dull,  radiates across abdomen and up into chest. Denies exertional cp or discomfort. No sob or unusual doe. +nausea. No vomiting. Having normal bms. No abd distension. No rectal bleeding or melena. Denies fever or chills. No back or flank pain. Pt with recent admission for similar symptoms, was found to have marginal ulcer. Denies current nsaid use.   The history is provided by the patient.  Abdominal Pain Associated symptoms: nausea   Associated symptoms: no chest pain, no chills, no constipation, no cough, no diarrhea, no dysuria, no fever, no shortness of breath, no sore throat and no vomiting        Past Medical History:  Diagnosis Date  . Chronic back pain   . GERD (gastroesophageal reflux disease)   . Hiatal hernia   . Hyperlipidemia   . Hypertension   . Pelvic fracture (HCC) 12/12/2012  . Smoker     Patient Active Problem List   Diagnosis Date Noted  . Epigastric pain 01/16/2020  . Ventral hernia without obstruction or gangrene   . Intractable epigastric abdominal pain 01/15/2020  . Insomnia 05/13/2014  . Generalized anxiety disorder 07/15/2013  . Hypertension   . Chronic back pain   . Hyperlipidemia   . GERD (gastroesophageal reflux disease)   . Hiatal hernia   . Smoker   . Pelvic fracture (HCC) 12/12/2012    Past Surgical History:  Procedure Laterality Date  . ABDOMINAL HYSTERECTOMY    . APPENDECTOMY    . ESOPHAGOGASTRODUODENOSCOPY (EGD) WITH PROPOFOL N/A 01/17/2020   Procedure: ESOPHAGOGASTRODUODENOSCOPY (EGD) WITH PROPOFOL;  Surgeon: Dolores Frame, MD;  Location: [facility]ITE;  Service: Gastroenterology;  Laterality: N/A;  .  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 HEMORROIDECTOMY    . LUMBAR SPINE SURGERY    . OOPHORECTOMY    . TONSILLECTOMY       OB History   No obstetric history on file.     History reviewed. No pertinent family history.  Social History   Tobacco Use  . Smoking status: Former Smoker    Packs/day: 0.50    Types: Cigarettes  . Smokeless tobacco: Former Neurosurgeon    Quit date: 12/28/2012  Vaping Use  . Vaping Use: Every day  Substance Use Topics  . Alcohol use: No  . Drug use: No    Home Medications Prior to Admission medications   Medication Sig Start Date End Date Taking? Authorizing Provider  acetaminophen (TYLENOL) 325 MG tablet Take 2 tablets (650 mg total) by mouth every 6 (six) hours as needed for mild pain, moderate pain or headache (or Fever >/= 101). 01/19/20 02/18/20  Sherryll Burger, Pratik D, DO  amLODipine (NORVASC) 10 MG tablet TAKE 1 TABLET BY MOUTH EVERY DAY 09/28/16   Allayne Butcher B, PA-C  atorvastatin (LIPITOR) 20 MG tablet TAKE 1 TABLET BY MOUTH EVERY NIGHT AT BEDTIME 07/11/16   Dixon, Patriciaann Clan, PA-C  diphenoxylate-atropine (LOMOTIL) 2.5-0.025 MG tablet Take 1 tablet by mouth 4 (four) times daily as needed for diarrhea or loose stools. 06/03/15   Donnetta Hutching, MD  gabapentin (NEURONTIN) 600 MG tablet TAKE 1 TABLET BY MOUTH THREE TIMES DAILY 07/13/15   Dorena Bodo,  PA-C  lisinopril (ZESTRIL) 10 MG tablet Take 10 mg by mouth daily. 12/04/19   [provider]  Menthol, Topical Analgesic, (BENGAY PAIN RELIEF + MASSAGE EX) Apply 1 application topically daily as needed.    [provider]  ondansetron (ZOFRAN) 4 MG tablet Take 1 tablet (4 mg total) by mouth every 6 (six) hours. 06/03/15   Donnetta Hutching, MD  oxyCODONE (ROXICODONE) 15 MG immediate release tablet Take 15 mg by mouth 4 (four) times daily.    [provider]  pantoprazole (PROTONIX) 40 MG tablet TAKE 1 TABLET BY MOUTH EACH DAY Patient taking differently: Take 40 mg by mouth daily.  12/25/15   Dorena Bodo, PA-C    pantoprazole (PROTONIX) 40 MG tablet Take 1 tablet (40 mg total) by mouth 2 (two) times daily. 01/19/20 04/18/20  Sherryll Burger, Pratik D, DO  potassium chloride SA (KLOR-CON) 20 MEQ tablet Take 20 mEq by mouth 2 (two) times daily. 01/02/20   [provider]  QUEtiapine (SEROQUEL) 200 MG tablet Take 200 mg by mouth at bedtime. 10/22/19   [provider]  rOPINIRole (REQUIP) 3 MG tablet Take 3 mg by mouth at bedtime. 10/23/19   [provider]  sucralfate (CARAFATE) 1 g tablet Take 1 tablet (1 g total) by mouth 2 (two) times daily. 01/19/20 02/18/20  Sherryll Burger, Pratik D, DO  trolamine salicylate (ASPERCREME) 10 % cream Apply 1 application topically as needed for muscle pain.    [provider]    Allergies    Prozac [fluoxetine hcl], Zoloft [sertraline hcl], and Nicoderm [nicotine]  Review of Systems   Review of Systems  Constitutional: Negative for chills and fever.  HENT: Negative for sore throat.   Eyes: Negative for redness.  Respiratory: Negative for cough and shortness of breath.   Cardiovascular: Negative for chest pain and leg swelling.  Gastrointestinal: Positive for abdominal pain and nausea. Negative for abdominal distention, constipation, diarrhea and vomiting.  Genitourinary: Negative for dysuria and flank pain.  Musculoskeletal: Negative for back pain and neck pain.  Skin: Negative for rash.  Neurological: Negative for headaches.  Hematological: Does not bruise/bleed easily.  Psychiatric/Behavioral: Negative for confusion.    Physical Exam Updated Vital Signs BP (!) 149/119 (BP Location: Right Arm)   Pulse (!) 107   Temp 97.7 F (36.5 C) (Oral)   Resp 18   Ht 1.549 m (5\' 1" )   Wt 79.4 kg   SpO2 94%   BMI 33.07 kg/m   Physical Exam Vitals and nursing note reviewed.  Constitutional:      Appearance: Normal appearance. She is well-developed.  HENT:     Head: Atraumatic.     Nose: Nose normal.     Mouth/Throat:     Mouth: Mucous membranes are  moist.  Eyes:     General: No scleral icterus.    Conjunctiva/sclera: Conjunctivae normal.  Neck:     Trachea: No tracheal deviation.  Cardiovascular:     Rate and Rhythm: Regular rhythm. Tachycardia present.     Pulses: Normal pulses.     Heart sounds: Normal heart sounds. No murmur heard.  No friction rub. No gallop.   Pulmonary:     Effort: Pulmonary effort is normal. No respiratory distress.     Breath sounds: Normal breath sounds.  Abdominal:     General: Bowel sounds are normal. There is no distension.     Palpations: Abdomen is soft. There is no mass.     Tenderness: There is abdominal tenderness.  There is no guarding.     Comments: Epigastric tenderness. No peritoneal signs. No incarcerated hernia.   Genitourinary:    Comments: No cva tenderness.  Musculoskeletal:        General: No swelling.     Cervical back: Normal range of motion and neck supple. No rigidity. No muscular tenderness.  Skin:    General: Skin is warm and dry.     Findings: No rash.  Neurological:     Mental Status: She is alert.     Comments: Alert, speech normal.   Psychiatric:     Comments: Anxious. Requesting pain medication.      ED Results / Procedures / Treatments   Labs (all labs ordered are listed, but only abnormal results are displayed) Results for orders placed or performed during the hospital encounter of 01/29/20  Lipase, blood  Result Value Ref Range   Lipase 18 11 - 51 U/L  Comprehensive metabolic panel  Result Value Ref Range   Sodium 131 (L) 135 - 145 mmol/L   Potassium 3.7 3.5 - 5.1 mmol/L   Chloride 89 (L) 98 - 111 mmol/L   CO2 32 22 - 32 mmol/L   Glucose, Bld 149 (H) 70 - 99 mg/dL   BUN 29 (H) 8 - 23 mg/dL   Creatinine, Ser 7.90 (H) 0.44 - 1.00 mg/dL   Calcium 24.0 8.9 - 97.3 mg/dL   Total Protein 7.5 6.5 - 8.1 g/dL   Albumin 3.2 (L) 3.5 - 5.0 g/dL   AST 11 (L) 15 - 41 U/L   ALT 10 0 - 44 U/L   Alkaline Phosphatase 101 38 - 126 U/L   Total Bilirubin 0.7 0.3 - 1.2  mg/dL   GFR, Estimated 30 (L) >60 mL/min   Anion gap 10 5 - 15  CBC  Result Value Ref Range   WBC 12.9 (H) 4.0 - 10.5 K/uL   RBC 4.09 3.87 - 5.11 MIL/uL   Hemoglobin 11.0 (L) 12.0 - 15.0 g/dL   HCT 53.2 36 - 46 %   MCV 88.0 80.0 - 100.0 fL   MCH 26.9 26.0 - 34.0 pg   MCHC 30.6 30.0 - 36.0 g/dL   RDW 99.2 (H) 42.6 - 83.4 %   Platelets 474 (H) 150 - 400 K/uL   nRBC 0.0 0.0 - 0.2 %  Urinalysis, Routine w reflex microscopic Urine, Clean Catch  Result Value Ref Range   Color, Urine AMBER (A) YELLOW   APPearance CLOUDY (A) CLEAR   Specific Gravity, Urine 1.025 1.005 - 1.030   pH 5.0 5.0 - 8.0   Glucose, UA NEGATIVE NEGATIVE mg/dL   Hgb urine dipstick NEGATIVE NEGATIVE   Bilirubin Urine SMALL (A) NEGATIVE   Ketones, ur NEGATIVE NEGATIVE mg/dL   Protein, ur 30 (A) NEGATIVE mg/dL   Nitrite NEGATIVE NEGATIVE   Leukocytes,Ua TRACE (A) NEGATIVE   RBC / HPF 0-5 0 - 5 RBC/hpf   WBC, UA 6-10 0 - 5 WBC/hpf   Bacteria, UA NONE SEEN NONE SEEN   Squamous Epithelial / LPF 11-20 0 - 5   Mucus PRESENT    Hyaline Casts, UA PRESENT    Non Squamous Epithelial 0-5 (A) NONE SEEN  Troponin I (High Sensitivity)  Result Value Ref Range   Troponin I (High Sensitivity) 18 (H) <18 ng/L   CT ABDOMEN PELVIS W CONTRAST  Result Date: 01/13/2020 CLINICAL DATA:  Suspected diverticulitis in a patient with worsening abdominal pain. Also presenting with lower chest pain that been going  on for the past 6 days. Dull constant pain. EXAM: CT ABDOMEN AND PELVIS WITH CONTRAST TECHNIQUE: Multidetector CT imaging of the abdomen and pelvis was performed using the standard protocol following bolus administration of intravenous contrast. CONTRAST:  OMNIPAQUE IOHEXOL 300 MG/ML  SOLN COMPARISON:  CT angiography 01/04/2020 chest abdomen pelvis. FINDINGS: Lower chest: No focal consolidation within the visualized lungs. Severe four-vessel coronary artery calcifications. At least mild mitral annular calcifications. Small hiatal  hernia. Hepatobiliary: Hepatic steatosis. No focal liver abnormality. Nonspecific hydropic gallbladder measuring up to 6 cm on axial imaging. Otherwise no gallstones, gallbladder wall thickening, or pericholecystic fluid. No biliary dilatation. Pancreas: Diffusely atrophic. No focal lesion. Otherwise normal pancreatic contour. No surrounding inflammatory changes. No main pancreatic ductal dilatation. Spleen: Normal in size without focal abnormality. Adrenals/Urinary Tract: Hyperplastic right adrenal gland. Slightly hyperplastic left adrenal gland. No adrenal nodule bilaterally. Bilateral kidneys enhance symmetrically. No hydronephrosis. No hydroureter. The urinary bladder is unremarkable. Stomach/Bowel: Roux-en-Y gastric bypass. Stomach is within normal limits. No evidence of bowel wall thickening or dilatation. Diffuse sigmoid diverticulosis. The appendix is not visualized and likely surgically absent. Vascular/Lymphatic: No abdominal aorta or iliac aneurysm. Severe atherosclerotic plaque of the aorta and its branches. No abdominal, pelvic, or inguinal lymphadenopathy. Reproductive: Uterus and bilateral adnexa are unremarkable. Other: No intraperitoneal free fluid. No intraperitoneal free gas. No organized fluid collection. Musculoskeletal: Moderate sized fat containing supra-umbilical ventral wall hernia with an abdominal defect of 1.2 cm. Subcutaneus soft tissue edema along the midline lower back at the L5-S1 level. No organized fluid collection. Diffusely decreased bone density. No suspicious lytic or blastic osseous lesions. No acute displaced fracture. Multilevel degenerative changes of the spine. Posterolateral fusion at the L5-S1 level with similar-appearing grade 2 anterolisthesis of L5 on S1. Compression fracture of the T9 vertebral body that appears similar to prior with complete height loss centrally. Old superior and inferior right pubic rami fractures. IMPRESSION: 1. Diffuse sigmoid diverticulosis with  no acute diverticulitis. 2. Small hiatal hernia. 3. Moderate sized fat containing supra-umbilical ventral wall hernia with an abdominal defect of 1.2 cm. No findings suggest ischemia or bowel obstruction. 4. Hepatic steatosis. 5. Aortic Atherosclerosis (ICD10-I70.0) as well as severe coronary artery calcifications. 6. Chronic T9 compression fracture with 100% vertebral body height loss centrally. 7. Status post L5-S1 posterolateral fusion with stable grade 2 anterolisthesis at this level. Limited evaluation of central canal stenosis due to streak artifact originating from the surgical hardware. Electronically Signed   By: Tish Frederickson M.D.   On: 01/13/2020 17:19   DG Chest Port 1 View  Result Date: 01/15/2020 CLINICAL DATA:  Abdominal and back pain, weakness EXAM: PORTABLE CHEST 1 VIEW COMPARISON:  01/13/2020 FINDINGS: Single frontal view of the chest demonstrates a stable cardiac silhouette. Chronic scarring throughout the lungs, most pronounced at the left lung base. No airspace disease, effusion, or pneumothorax. No acute bony abnormalities. IMPRESSION: 1. Stable areas of scarring.  No acute airspace disease. Electronically Signed   By: Sharlet Salina M.D.   On: 01/15/2020 19:55   DG Chest Portable 1 View  Result Date: 01/13/2020 CLINICAL DATA:  Chest pain. EXAM: PORTABLE CHEST 1 VIEW COMPARISON:  CTA chest, abdomen, and pelvis dated January 04, 2020. FINDINGS: The heart size and mediastinal contours are within normal limits. Atherosclerotic calcification of the aortic arch. Normal pulmonary vascularity. Unchanged bibasilar scarring. No focal consolidation, pleural effusion, or pneumothorax. No acute osseous abnormality. Old right-sided rib fractures again noted. Advanced degenerative changes of the right shoulder. IMPRESSION:  No active disease. Electronically Signed   By: Obie DredgeWilliam T Derry M.D.   On: 01/13/2020 12:33   ECHOCARDIOGRAM COMPLETE  Result Date: 01/16/2020    ECHOCARDIOGRAM REPORT    Patient Name:   Augusto GarbeBRENDA Radford Date of Exam: 01/16/2020 Medical Rec #:  [id number]      Height:       61.0 in Accession #:    [id number]     Weight:       162.9 lb Date of Birth:  11/04/1940      BSA:          1.731 m Patient Age:    79 years       BP:           112/66 mmHg Patient Gender: F              HR:           77 bpm. [facility] Procedure: 2D Echo Indications:    Chest Pain 786.50 / R07.9  History:        Patient has no prior history of Echocardiogram examinations.                 Signs/Symptoms:Chest Pain; Risk Factors:Hypertension, Former                 Smoker and Dyslipidemia. GERD.  Sonographer:    Jeryl ColumbiaJohanna Elliott RDCS (AE) Referring Phys: [id number] Heloise BeechamEJIROGHENE E [facility] CenterEMOKPAE IMPRESSIONS  1. Left ventricular ejection fraction, by estimation, is 60 to 65%. The left ventricle has normal function. The left ventricle has no regional wall motion abnormalities. There is moderate left ventricular hypertrophy. Left ventricular diastolic parameters are consistent with Grade I diastolic dysfunction (impaired relaxation).  2. Right ventricular systolic function is normal. The right ventricular size is normal.  3. The mitral valve is normal in structure. No evidence of mitral valve regurgitation. No evidence of mitral stenosis.  4. The aortic valve is tricuspid. There is mild calcification of the aortic valve. There is mild thickening of the aortic valve. Aortic valve regurgitation is not visualized. No aortic stenosis is present.  5. The inferior vena cava is normal in size with greater than 50% respiratory variability, suggesting right atrial pressure of 3 mmHg. FINDINGS  Left Ventricle: Left ventricular ejection fraction, by estimation, is 60 to 65%. The left ventricle has normal function. The left ventricle has no regional wall motion abnormalities. The left ventricular internal cavity size was normal in size. There is  moderate left ventricular hypertrophy. Left ventricular diastolic parameters are  consistent with Grade I diastolic dysfunction (impaired relaxation). Normal left ventricular filling pressure. Right Ventricle: The right ventricular size is normal. No increase in right ventricular wall thickness. Right ventricular systolic function is normal. Left Atrium: Left atrial size was normal in size. Right Atrium: Right atrial size was normal in size. Pericardium: There is no evidence of pericardial effusion. Mitral Valve: The mitral valve is normal in structure. There is mild thickening of the mitral valve leaflet(s). There is mild calcification of the mitral valve leaflet(s). Mild mitral annular calcification. No evidence of mitral valve regurgitation. No evidence of mitral valve stenosis. Tricuspid Valve: The tricuspid valve is normal in structure. Tricuspid valve regurgitation is trivial. No evidence of tricuspid stenosis. Aortic Valve: The aortic valve is tricuspid. There is mild calcification of the aortic valve. There is mild thickening of the aortic valve. There is mild aortic valve annular calcification. Aortic valve regurgitation is not visualized. No aortic stenosis  is present. Aortic valve mean gradient measures 7.0 mmHg. Aortic valve peak gradient measures 12.7 mmHg. Aortic valve area, by VTI measures 2.58 cm. Pulmonic Valve: The pulmonic valve was not well visualized. Pulmonic valve regurgitation is not visualized. No evidence of pulmonic stenosis. Aorta: The aortic root is normal in size and structure. Pulmonary Artery: Indeterminant PASP, inadequate TR jet. Venous: The inferior vena cava is normal in size with greater than 50% respiratory variability, suggesting right atrial pressure of 3 mmHg. IAS/Shunts: No atrial level shunt detected by color flow Doppler.  LEFT VENTRICLE PLAX 2D LVIDd:         4.84 cm  Diastology LVIDs:         2.66 cm  LV e' medial:    10.80 cm/s LV PW:         1.20 cm  LV E/e' medial:  7.3 LV IVS:        1.24 cm  LV e' lateral:   5.98 cm/s LVOT diam:     2.20 cm  LV  E/e' lateral: 13.1 LV SV:         93 LV SV Index:   54 LVOT Area:     3.80 cm  RIGHT VENTRICLE RV S prime:     16.30 cm/s TAPSE (M-mode): 1.8 cm LEFT ATRIUM             Index       RIGHT ATRIUM           Index LA diam:        5.00 cm 2.89 cm/m  RA Area:     12.70 cm LA Vol (A2C):   53.4 ml 30.85 ml/m RA Volume:   35.50 ml  20.51 ml/m LA Vol (A4C):   49.3 ml 28.48 ml/m LA Biplane Vol: 51.9 ml 29.98 ml/m  AORTIC VALVE AV Area (Vmax):    2.43 cm AV Area (Vmean):   2.01 cm AV Area (VTI):     2.58 cm AV Vmax:           178.41 cm/s AV Vmean:          126.491 cm/s AV VTI:            0.361 m AV Peak Grad:      12.7 mmHg AV Mean Grad:      7.0 mmHg LVOT Vmax:         113.97 cm/s LVOT Vmean:        66.793 cm/s LVOT VTI:          0.245 m LVOT/AV VTI ratio: 0.68  AORTA Ao Root diam: 2.60 cm MITRAL VALVE                TRICUSPID VALVE MV Area (PHT): 2.33 cm     TR Peak grad:   28.1 mmHg MV Decel Time: 326 msec     TR Vmax:        265.00 cm/s MV E velocity: 78.40 cm/s MV A velocity: 124.00 cm/s  SHUNTS MV E/A ratio:  0.63         Systemic VTI:  0.24 m                             Systemic Diam: 2.20 cm Dina Rich MD Electronically signed by Dina Rich MD Signature Date/Time: 01/16/2020/12:37:20 PM    Final    CT Angio Chest/Abd/Pel for Dissection W and/or W/WO  Result Date: 01/04/2020 CLINICAL DATA:  Chest pain and abdominal pain x6 days. EXAM: CT ANGIOGRAPHY CHEST, ABDOMEN AND PELVIS TECHNIQUE: Non-contrast CT of the chest was initially obtained. Multidetector CT imaging through the chest, abdomen and pelvis was performed using the standard protocol during bolus administration of intravenous contrast. Multiplanar reconstructed images and MIPs were obtained and reviewed to evaluate the vascular anatomy. CONTRAST:  OMNIPAQUE IOHEXOL 350 MG/ML SOLN COMPARISON:  None. FINDINGS: CTA CHEST FINDINGS Cardiovascular: There is moderate to marked severity calcification of the thoracic aorta, without evidence  of aneurysmal dilatation. Satisfactory opacification of the pulmonary arteries to the segmental level. No evidence of pulmonary embolism. Normal heart size with marked severity coronary artery calcification. No pericardial effusion. Mediastinum/Nodes: No enlarged mediastinal, hilar, or axillary lymph nodes. Thyroid gland, trachea, and esophagus demonstrate no significant findings. Lungs/Pleura: Mild linear scarring and/or atelectasis is seen within the bilateral lung bases and inferior aspect of the left upper lobe. There is no evidence of a pleural effusion or pneumothorax. Musculoskeletal: A chronic versus congenital appearing linear area is seen involving the body of the sternum. Numerous chronic right-sided rib fractures are seen. A chronic compression fracture deformity is seen involving the T9 vertebral body. Review of the MIP images confirms the above findings. CTA ABDOMEN AND PELVIS FINDINGS VASCULAR Aorta: Marked severity calcification, without aneurysm, dissection, vasculitis or significant stenosis. Celiac: Patent without evidence of aneurysm, dissection, vasculitis or significant stenosis. SMA: Patent without evidence of aneurysm, dissection, vasculitis or significant stenosis. Renals: Both renal arteries are patent without evidence of aneurysm, dissection, vasculitis, fibromuscular dysplasia or significant stenosis. IMA: Patent without evidence of aneurysm, dissection, vasculitis or significant stenosis. Inflow: Marked severity calcification without evidence of aneurysm, dissection, vasculitis or significant stenosis. Veins: No obvious venous abnormality within the limitations of this arterial phase study. Review of the MIP images confirms the above findings. NON-VASCULAR Hepatobiliary: There is mild diffuse fatty infiltration of the liver parenchyma. No focal liver abnormality is seen. The gallbladder is moderately distended without evidence of gallstones, gallbladder wall thickening, or biliary  dilatation. Pancreas: Unremarkable. No pancreatic ductal dilatation or surrounding inflammatory changes. Spleen: Normal in size without focal abnormality. Adrenals/Urinary Tract: There is mild diffuse bilateral adrenal gland enlargement. Kidneys are normal, without renal calculi, focal lesion, or hydronephrosis. A moderate amount of heterogeneous, mildly increased attenuation is seen within the dependent portion of the urinary bladder lumen. Stomach/Bowel: There is a small to moderate sized hiatal hernia. Surgical sutures are seen within the adjacent portion of the gastric region. The appendix is surgically absent. Surgically anastomosed bowel is seen within the anterior aspect of the mid to lower abdomen. No evidence of bowel dilatation. Numerous diverticula are seen throughout the sigmoid colon. Mild thickening of the mid sigmoid colon is seen. Lymphatic: No abnormal abdominal or pelvic lymph nodes are identified. Reproductive: Status post hysterectomy. No adnexal masses. Other: No abdominal wall hernia or abnormality. No abdominopelvic ascites. Musculoskeletal: Chronic fracture deformities are seen involving the right superior and right inferior pubic rami. Bilateral metallic density pedicle screws are seen at the levels of L5 and S1. Approximately 1.1 cm anterolisthesis of the L5 vertebral body is seen on S1. Review of the MIP images confirms the above findings. IMPRESSION: 1. No evidence of aortic dissection or aneurysmal dilatation. 2. Marked severity coronary artery calcification. 3. Fatty liver. 4. Small to moderate sized hiatal hernia. 5. Sigmoid diverticulosis with mild thickening of the mid sigmoid colon which may represent mild acute diverticulitis. 6. Postoperative changes within the abdomen and pelvis. 7. Chronic fracture deformities  involving the right superior and right inferior pubic rami. 8. 1.1 cm anterolisthesis of the L5 vertebral body on S1. 9. Aortic atherosclerosis. Aortic Atherosclerosis  (ICD10-I70.0). Electronically Signed   By: Aram Candela M.D.   On: 01/04/2020 22:55    EKG None  Radiology No results found.  Procedures Procedures (including critical care time)  Medications Ordered in ED Medications  sodium chloride 0.9 % bolus 1,000 mL (has no administration in time range)  HYDROmorphone (DILAUDID) injection 0.5 mg (has no administration in time range)  ondansetron (ZOFRAN) injection 4 mg (has no administration in time range)  pantoprazole (PROTONIX) 80 mg in sodium chloride 0.9 % 100 mL IVPB (has no administration in time range)    ED Course  I have reviewed the triage vital signs and the nursing notes.  Pertinent labs & imaging results that were available during my care of the patient were reviewed by me and considered in my medical decision making (see chart for details).    MDM Rules/Calculators/A&P                         Iv ns bolus. Stat labs.   Reviewed nursing notes and prior charts for additional history. Recent admission with epigastric pain - endoscopy then with large marginal ulcer.   protonix iv. Dilaudid .5 mg iv. zofran iv.   Labs reviewed/interpreted by me-  Wbc elevated compared to prior, 12. aki on labs. Additional iv ns bolus. Persistent pain and tenderness. Will get ct.   0030, CT pending - pt signed out to Dr Clayborne Dana, check CT when resulted and address accordingly (regardless of results, given AKI, pt will require admission).      Final Clinical Impression(s) / ED Diagnoses Final diagnoses:  None    Rx / DC Orders ED Discharge Orders    None       Cathren Laine, MD 01/30/20 [phone]

## 2020-01-30 DIAGNOSIS — R1013 Epigastric pain: Secondary | ICD-10-CM

## 2020-01-30 DIAGNOSIS — E860 Dehydration: Secondary | ICD-10-CM

## 2020-01-30 DIAGNOSIS — E669 Obesity, unspecified: Secondary | ICD-10-CM

## 2020-01-30 DIAGNOSIS — F17200 Nicotine dependence, unspecified, uncomplicated: Secondary | ICD-10-CM

## 2020-01-30 DIAGNOSIS — J9601 Acute respiratory failure with hypoxia: Secondary | ICD-10-CM

## 2020-01-30 DIAGNOSIS — K449 Diaphragmatic hernia without obstruction or gangrene: Secondary | ICD-10-CM

## 2020-01-30 DIAGNOSIS — Z9114 Patient's other noncompliance with medication regimen: Secondary | ICD-10-CM

## 2020-01-30 DIAGNOSIS — I10 Essential (primary) hypertension: Secondary | ICD-10-CM

## 2020-01-30 DIAGNOSIS — K289 Gastrojejunal ulcer, unspecified as acute or chronic, without hemorrhage or perforation: Principal | ICD-10-CM

## 2020-01-30 DIAGNOSIS — N179 Acute kidney failure, unspecified: Secondary | ICD-10-CM | POA: Diagnosis not present

## 2020-01-30 DIAGNOSIS — Z91148 Patient's other noncompliance with medication regimen for other reason: Secondary | ICD-10-CM

## 2020-01-30 DIAGNOSIS — E782 Mixed hyperlipidemia: Secondary | ICD-10-CM

## 2020-01-30 DIAGNOSIS — R110 Nausea: Secondary | ICD-10-CM

## 2020-01-30 DIAGNOSIS — F411 Generalized anxiety disorder: Secondary | ICD-10-CM

## 2020-01-30 DIAGNOSIS — E8809 Other disorders of plasma-protein metabolism, not elsewhere classified: Secondary | ICD-10-CM

## 2020-01-30 LAB — CBC
HCT: 33.8 % — ABNORMAL LOW (ref 36.0–46.0)
Hemoglobin: 10 g/dL — ABNORMAL LOW (ref 12.0–15.0)
MCH: 26.5 pg (ref 26.0–34.0)
MCHC: 29.6 g/dL — ABNORMAL LOW (ref 30.0–36.0)
MCV: 89.4 fL (ref 80.0–100.0)
Platelets: 438 10*3/uL — ABNORMAL HIGH (ref 150–400)
RBC: 3.78 MIL/uL — ABNORMAL LOW (ref 3.87–5.11)
RDW: 15.9 % — ABNORMAL HIGH (ref 11.5–15.5)
WBC: 10.3 10*3/uL (ref 4.0–10.5)
nRBC: 0 % (ref 0.0–0.2)

## 2020-01-30 LAB — COMPREHENSIVE METABOLIC PANEL
ALT: 7 U/L (ref 0–44)
ALT: 8 U/L (ref 0–44)
AST: 9 U/L — ABNORMAL LOW (ref 15–41)
AST: 8 U/L — ABNORMAL LOW (ref 15–41)
Albumin: 2.7 g/dL — ABNORMAL LOW (ref 3.5–5.0)
Albumin: 2.7 g/dL — ABNORMAL LOW (ref 3.5–5.0)
Alkaline Phosphatase: 88 U/L (ref 38–126)
Alkaline Phosphatase: 87 U/L (ref 38–126)
Anion gap: 9 (ref 5–15)
Anion gap: 8 (ref 5–15)
BUN: 27 mg/dL — ABNORMAL HIGH (ref 8–23)
BUN: 27 mg/dL — ABNORMAL HIGH (ref 8–23)
CO2: 30 mmol/L (ref 22–32)
CO2: 30 mmol/L (ref 22–32)
Calcium: 8.9 mg/dL (ref 8.9–10.3)
Calcium: 8.7 mg/dL — ABNORMAL LOW (ref 8.9–10.3)
Chloride: 94 mmol/L — ABNORMAL LOW (ref 98–111)
Chloride: 95 mmol/L — ABNORMAL LOW (ref 98–111)
Creatinine, Ser: 1.27 mg/dL — ABNORMAL HIGH (ref 0.44–1.00)
Creatinine, Ser: 1.31 mg/dL — ABNORMAL HIGH (ref 0.44–1.00)
GFR, Estimated: 44 mL/min — ABNORMAL LOW (ref >60)
GFR, Estimated: 42 mL/min — ABNORMAL LOW (ref >60)
Glucose, Bld: 125 mg/dL — ABNORMAL HIGH (ref 70–99)
Glucose, Bld: 115 mg/dL — ABNORMAL HIGH (ref 70–99)
Potassium: 2.8 mmol/L — ABNORMAL LOW (ref 3.5–5.1)
Potassium: 3 mmol/L — ABNORMAL LOW (ref 3.5–5.1)
Sodium: 133 mmol/L — ABNORMAL LOW (ref 135–145)
Sodium: 133 mmol/L — ABNORMAL LOW (ref 135–145)
Total Bilirubin: 0.7 mg/dL (ref 0.3–1.2)
Total Bilirubin: 0.7 mg/dL (ref 0.3–1.2)
Total Protein: 6.6 g/dL (ref 6.5–8.1)
Total Protein: 6.3 g/dL — ABNORMAL LOW (ref 6.5–8.1)

## 2020-01-30 LAB — PHOSPHORUS: Phosphorus: 3.7 mg/dL (ref 2.5–4.6)

## 2020-01-30 LAB — MAGNESIUM: Magnesium: 1.6 mg/dL — ABNORMAL LOW (ref 1.7–2.4)

## 2020-01-30 LAB — PROTIME-INR
INR: 1.1 (ref 0.8–1.2)
Prothrombin Time: 13.7 seconds (ref 11.4–15.2)

## 2020-01-30 LAB — APTT: aPTT: 31 seconds (ref 24–36)

## 2020-01-30 LAB — TROPONIN I (HIGH SENSITIVITY): Troponin I (High Sensitivity): 13 ng/L (ref <18)

## 2020-01-30 LAB — RESPIRATORY PANEL BY RT PCR (FLU A&B, COVID)
Influenza A by PCR: NEGATIVE
Influenza B by PCR: NEGATIVE
SARS Coronavirus 2 by RT PCR: NEGATIVE

## 2020-01-30 MED ORDER — OXYCODONE HCL 5 MG PO TABS
5.0000 mg | ORAL_TABLET | ORAL | Status: DC | PRN
  Filled 2020-01-30: qty 1

## 2020-01-30 MED ORDER — LABETALOL HCL 5 MG/ML IV SOLN: 10.0000 mg | INTRAVENOUS | Status: DC | PRN

## 2020-01-30 MED ORDER — GLUCERNA SHAKE PO LIQD
237.0000 mL | Freq: Three times a day (TID) | ORAL | Status: DC
  Administered 2020-01-30 – 2020-01-31 (×3): 237 mL via ORAL
  Filled 2020-01-30 (×5): qty 237

## 2020-01-30 MED ORDER — HEPARIN SODIUM (PORCINE) 5000 UNIT/ML IJ SOLN
5000.0000 [IU] | Freq: Three times a day (TID) | INTRAMUSCULAR | Status: DC
  Administered 2020-01-30 – 2020-01-31 (×5): 5000 [IU] via SUBCUTANEOUS
  Filled 2020-01-30 (×5): qty 1

## 2020-01-30 MED ORDER — PANTOPRAZOLE SODIUM 40 MG PO TBEC
40.0000 mg | DELAYED_RELEASE_TABLET | Freq: Two times a day (BID) | ORAL | Status: DC
  Administered 2020-01-30 – 2020-01-31 (×3): 40 mg via ORAL
  Filled 2020-01-30 (×3): qty 1

## 2020-01-30 MED ORDER — ONDANSETRON HCL 4 MG/2ML IJ SOLN: 4.0000 mg | Freq: Four times a day (QID) | INTRAMUSCULAR | Status: DC | PRN

## 2020-01-30 MED ORDER — HYDROMORPHONE HCL 1 MG/ML IJ SOLN
0.5000 mg | Freq: Once | INTRAMUSCULAR | Status: AC
  Administered 2020-01-30: 0.5 mg via INTRAVENOUS
  Filled 2020-01-30: qty 1

## 2020-01-30 MED ORDER — ACETAMINOPHEN 325 MG PO TABS: 650.0000 mg | ORAL_TABLET | Freq: Four times a day (QID) | ORAL | Status: DC | PRN

## 2020-01-30 MED ORDER — SODIUM CHLORIDE 0.9 % IV SOLN: Freq: Once | INTRAVENOUS | Status: AC

## 2020-01-30 MED ORDER — LABETALOL HCL 5 MG/ML IV SOLN: 5.0000 mg | Freq: Four times a day (QID) | INTRAVENOUS | Status: DC | PRN

## 2020-01-30 MED ORDER — MORPHINE SULFATE (PF) 2 MG/ML IV SOLN
2.0000 mg | INTRAVENOUS | Status: DC | PRN
  Administered 2020-01-30 – 2020-01-31 (×7): 2 mg via INTRAVENOUS
  Filled 2020-01-30 (×8): qty 1

## 2020-01-30 MED ORDER — SUCRALFATE 1 G PO TABS
1.0000 g | ORAL_TABLET | Freq: Three times a day (TID) | ORAL | Status: DC
  Administered 2020-01-30 – 2020-01-31 (×6): 1 g via ORAL
  Filled 2020-01-30 (×5): qty 1

## 2020-01-30 NOTE — Progress Notes (Signed)
SATURATION QUALIFICATIONS: (This note is used to comply with regulatory documentation for home oxygen)  Patient Saturations on Room Air at Rest = 96%  Patient Saturations on Room Air while Ambulating = 90%  Patient Saturations on 2 Liters of oxygen while Ambulating = 94%  Please briefly explain why patient needs home oxygen: Oxygen drops while ambulating.

## 2020-01-30 NOTE — ED Provider Notes (Signed)
12:47 AM Assumed care from Dr. Denton Lank, please see their note for full history, physical and decision making until this point. In brief this is a 79 y.o. year old female who presented to the ED tonight with Abdominal Pain     H/o abdominal pain, some chronic, to include having had diverticulitis in the past and most recently admitted, EGD done and found to have a marginal ulcer. Sent home on PPI/carafate and instructed to fu w/ pcp to wean medications and Gi for repeat EGD.  Back today because of pain and nausea. Still ttp in epigastric area. Found to have elevated WBC and AKI again. Plan is for admission but is pending ct scan to eval for any emergent issues.   CT w/ e/o marginal ulcer as well (not seen previously on ct scan but was seen on EGD, wonder if it has worsened). HR improved. VSS otherwise. Will d/w hospitalist regarding admission for dehydration/AKI per previous plan.   Labs, studies and imaging reviewed by myself and considered in medical decision making if ordered. Imaging interpreted by radiology.  Labs Reviewed  COMPREHENSIVE METABOLIC PANEL - Abnormal; Notable for the following components:      Result Value   Sodium 131 (*)    Chloride 89 (*)    Glucose, Bld 149 (*)    BUN 29 (*)    Creatinine, Ser 1.75 (*)    Albumin 3.2 (*)    AST 11 (*)    GFR, Estimated 30 (*)    All other components within normal limits  CBC - Abnormal; Notable for the following components:   WBC 12.9 (*)    Hemoglobin 11.0 (*)    RDW 15.9 (*)    Platelets 474 (*)    All other components within normal limits  URINALYSIS, ROUTINE W REFLEX MICROSCOPIC - Abnormal; Notable for the following components:   Color, Urine AMBER (*)    APPearance CLOUDY (*)    Bilirubin Urine SMALL (*)    Protein, ur 30 (*)    Leukocytes,Ua TRACE (*)    Non Squamous Epithelial 0-5 (*)    All other components within normal limits  TROPONIN I (HIGH SENSITIVITY) - Abnormal; Notable for the following components:   Troponin  I (High Sensitivity) 18 (*)    All other components within normal limits  URINE CULTURE  LIPASE, BLOOD  TROPONIN I (HIGH SENSITIVITY)    CT Abdomen Pelvis Wo Contrast    (Results Pending)    No follow-ups on file.    Kymere Fullington, Barbara Cower, MD 02/01/20 0130

## 2020-01-30 NOTE — H&P (Signed)
History and Physical  Brenda Hopkins WUJ:[id number] DOB: 01/10/1941 DOA: 01/29/2020  Referring physician: Harold HedgeMessner, Jason, MD  PCP: Shawnie DapperMann, Benjamin L, PA-C  Patient coming from: Home  Chief Complaint: Abdominal Pain  HPI: Brenda DrapeBrenda Gail Gehres is a 79 y.o. female with medical history significant for hypertension, anxiety, marginal ulcer, tobacco abuse and chronic pain who presents to the ED due to constant sharp and occasional dull pain with radiation to the chest that has been ongoing since being discharged from the hospital (admitted from 11/3-11/7) due to intractable epigastric abdominal pain, pain was rated as 10/10 on pain scale with no known alleviating/aggravating factor.  Patient states that she has had decreased oral intake due to the abdominal pain and associated nausea.  During the hospitalization, EGD was done and patient was noted to have a marginal ulcer, she was discharged home on PPI/Carafate and instructed to follow-up with PCP to wean medication and also to follow-up with GI for repeat EGD.  Apparently, patient has not been taking the Protonix since discharge from the hospital.  Patient has not been taking NSAIDs since discharge per daughter at bedside.  She denies, fever, chills, headache, chest pain, diarrhea or constipation.  ED Course:  In the emergency department, she was initially tachycardic, but this improved over time.  BP was 163/109 and she was initially hypoxic at O2 sat of 83%, but O2 sat at bedside was 92% on room air.  Work-up in the ED showed leukocytosis, thrombocytosis, hyperglycemia, BUN to creatinine 29/1.75 (baseline creatinine at 0.9-1.2).  Troponin x2 18> 13, albumin 3.2.  CT abdomen pelvis without contrast showed marginal ulcer and sliding-type hiatal hernia.  Gallbladder distention without visible calcified gallstones was also noted.  She was treated with IV Dilaudid, IV Protonix 80 Mg x1 was given, IV Zofran four Mg x1 was also given and patient was provided  with IV hydration.  Hospitalist was asked to admit patient for further evaluation and management.  Review of Systems: Constitutional: Negative for chills and fever.  HENT: Negative for ear pain and sore throat.   Eyes: Negative for pain and visual disturbance.  Respiratory: Negative for cough, chest tightness and shortness of breath.   Cardiovascular: Negative for chest pain and palpitations.  Gastrointestinal: Positive for abdominal pain and nausea.  Negative for vomiting.  Endocrine: Negative for polyphagia and polyuria.  Genitourinary: Negative for decreased urine volume, dysuria, enuresis, hematuria Musculoskeletal: Negative for arthralgias and back pain.  Skin: Negative for color change and rash.  Allergic/Immunologic: Negative for immunocompromised state.  Neurological: Negative for tremors, syncope, speech difficulty, weakness, light-headedness and headaches.  Hematological: Does not bruise/bleed easily.  All other systems reviewed and are negative Review of systems as noted in the HPI. All other systems reviewed and are negative.   Past Medical History:  Diagnosis Date  . Chronic back pain   . GERD (gastroesophageal reflux disease)   . Hiatal hernia   . Hyperlipidemia   . Hypertension   . Pelvic fracture (HCC) 12/12/2012  . Smoker    Past Surgical History:  Procedure Laterality Date  . ABDOMINAL HYSTERECTOMY    . APPENDECTOMY    . ESOPHAGOGASTRODUODENOSCOPY (EGD) WITH PROPOFOL N/A 01/17/2020   Procedure: ESOPHAGOGASTRODUODENOSCOPY (EGD) WITH PROPOFOL;  Surgeon: Dolores Frameastaneda Mayorga, Daniel, MD;  Location: [facility];  Service: Gastroenterology;  Laterality: N/A;  . 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 HEMORROIDECTOMY    . LUMBAR SPINE SURGERY    . OOPHORECTOMY    . TONSILLECTOMY  Social History:  reports that she has quit smoking. Her smoking use included cigarettes. She smoked 0.50 packs per day. She quit smokeless tobacco use about 7 years ago. She reports that she does  not drink alcohol and does not use drugs.   Allergies  Allergen Reactions  . Prozac [Fluoxetine Hcl] Other (See Comments)    Makes her fell crazy  . Zoloft [Sertraline Hcl]     Made her feel crazy--10/2013  . Nicoderm [Nicotine] Rash    Site rash    History reviewed. No pertinent family history.   Prior to Admission medications   Medication Sig Start Date End Date Taking? Authorizing Provider  acetaminophen (TYLENOL) 325 MG tablet Take 2 tablets (650 mg total) by mouth every 6 (six) hours as needed for mild pain, moderate pain or headache (or Fever >/= 101). 01/19/20 02/18/20  Sherryll Burger, Pratik D, DO  amLODipine (NORVASC) 10 MG tablet TAKE 1 TABLET BY MOUTH EVERY DAY 09/28/16   Allayne Butcher B, PA-C  atorvastatin (LIPITOR) 20 MG tablet TAKE 1 TABLET BY MOUTH EVERY NIGHT AT BEDTIME 07/11/16   Dixon, Patriciaann Clan, PA-C  diphenoxylate-atropine (LOMOTIL) 2.5-0.025 MG tablet Take 1 tablet by mouth 4 (four) times daily as needed for diarrhea or loose stools. 06/03/15   Donnetta Hutching, MD  gabapentin (NEURONTIN) 600 MG tablet TAKE 1 TABLET BY MOUTH THREE TIMES DAILY 07/13/15   Allayne Butcher B, PA-C  lisinopril (ZESTRIL) 10 MG tablet Take 10 mg by mouth daily. 12/04/19   [provider]  Menthol, Topical Analgesic, (BENGAY PAIN RELIEF + MASSAGE EX) Apply 1 application topically daily as needed.    [provider]  ondansetron (ZOFRAN) 4 MG tablet Take 1 tablet (4 mg total) by mouth every 6 (six) hours. 06/03/15   Donnetta Hutching, MD  oxyCODONE (ROXICODONE) 15 MG immediate release tablet Take 15 mg by mouth 4 (four) times daily.    [provider]  pantoprazole (PROTONIX) 40 MG tablet TAKE 1 TABLET BY MOUTH EACH DAY Patient taking differently: Take 40 mg by mouth daily.  12/25/15   Dorena Bodo, PA-C  pantoprazole (PROTONIX) 40 MG tablet Take 1 tablet (40 mg total) by mouth 2 (two) times daily. 01/19/20 04/18/20  Sherryll Burger, Pratik D, DO  potassium chloride SA (KLOR-CON) 20 MEQ tablet Take 20 mEq by mouth 2  (two) times daily. 01/02/20   [provider]  QUEtiapine (SEROQUEL) 200 MG tablet Take 200 mg by mouth at bedtime. 10/22/19   [provider]  rOPINIRole (REQUIP) 3 MG tablet Take 3 mg by mouth at bedtime. 10/23/19   [provider]  sucralfate (CARAFATE) 1 g tablet Take 1 tablet (1 g total) by mouth 2 (two) times daily. 01/19/20 02/18/20  Sherryll Burger, Pratik D, DO  trolamine salicylate (ASPERCREME) 10 % cream Apply 1 application topically as needed for muscle pain.    [provider]    Physical Exam: BP (!) 156/85   Pulse 84   Temp 97.7 F (36.5 C) (Oral)   Resp 16   Ht 5\' 1"  (1.549 m)   Wt 79.4 kg   SpO2 95%   BMI 33.07 kg/m   . General: 79 y.o. year-old female well developed well nourished in no acute distress.  Alert and oriented x3. 62 HEENT: Dry mucous membrane.  NCAT, EOMI . Neck: Supple, trachea medial. . Cardiovascular: Regular rate and rhythm with no rubs or gallops.  No thyromegaly or JVD noted.  No lower extremity edema. 2/4 pulses in all 4  extremities. Marland Kitchen Respiratory: Clear to auscultation with no wheezes or rales. Good inspiratory effort. . Abdomen: Soft, tender to palpation of epigastric area.  Nondistended with normal bowel sounds x4 quadrants. . Muskuloskeletal: No cyanosis, clubbing or edema noted bilaterally . Neuro: CN II-XII intact, strength, sensation, reflexes . Skin: No ulcerative lesions noted or rashes . Psychiatry: Judgement and insight appear normal. Mood is appropriate for condition and setting          Labs on Admission:  Basic Metabolic Panel: Recent Labs  Lab 01/29/20 2145  NA 131*  K 3.7  CL 89*  CO2 32  GLUCOSE 149*  BUN 29*  CREATININE 1.75*  CALCIUM 10.0   Liver Function Tests: Recent Labs  Lab 01/29/20 2145  AST 11*  ALT 10  ALKPHOS 101  BILITOT 0.7  PROT 7.5  ALBUMIN 3.2*   Recent Labs  Lab 01/29/20 2145  LIPASE 18   No results for input(s): AMMONIA in the last 168 hours. CBC: Recent Labs    Lab 01/29/20 2145  WBC 12.9*  HGB 11.0*  HCT 36.0  MCV 88.0  PLT 474*   Cardiac Enzymes: No results for input(s): CKTOTAL, CKMB, CKMBINDEX, TROPONINI in the last 168 hours.  BNP (last 3 results) Recent Labs    01/15/20 1929  BNP 324.0*    ProBNP (last 3 results) No results for input(s): PROBNP in the last 8760 hours.  CBG: No results for input(s): GLUCAP in the last 168 hours.  Radiological Exams on Admission: CT Abdomen Pelvis Wo Contrast  Result Date: 01/30/2020 CLINICAL DATA:  Several days of epigastric pain, acute onset, moderate constant and dull radiating across abdomen and appendage chest EXAM: CT ABDOMEN AND PELVIS WITHOUT CONTRAST TECHNIQUE: Multidetector CT imaging of the abdomen and pelvis was performed following the standard protocol without IV contrast. COMPARISON:  CT 01/13/2020, 01/04/2020, lumbar radiographs 02/10/2014 FINDINGS: Lower chest: Hypoattenuation of the cardiac blood pool relative to the myocardium compatible with mild anemia. Three-vessel coronary artery atherosclerosis. Dense calcifications upon the mitral annulus and aortic leaflets. Suspect lipomatous hypertrophy of the interatrial septum. No pericardial effusion. Areas of scarring and architectural distortion present in the bases. Some additional atelectatic changes are present. Few nonunited posterior right rib fractures are seen. Hepatobiliary: No visible focal liver lesion within limitations of an unenhanced CT. Smooth liver surface contour. Normal hepatic attenuation. Colonic interposition anterior to the liver. Moderate gallbladder distension measuring up to 6.3 cm in diameter and nearly 11 cm in length. No pericholecystic fluid or inflammation. No visible calcified gallstones. No biliary ductal dilatation. Pancreas: No pancreatic ductal dilatation or surrounding inflammatory changes. Spleen: Normal in size. No concerning splenic lesions. Adrenals/Urinary Tract: Lobular thickening of the adrenal  glands without concerning dominant nodule. Can reflect senescent adrenal hyperplasia. Kidneys are symmetric in size and normally located. Stable mild bilateral perinephric stranding is nonspecific particularly given chronicity. Lobular margins of the kidneys could reflect persistent fetal lobulation or cortical scarring unchanged from prior. Vascular calcifications in the renal pelves. No obstructive urolithiasis or hydronephrosis. No contour deforming or worrisome renal lesions are seen. Slightly asymmetric bladder wall thickening. May in part be attributable to some underdistention. No bladder calculi or debris. Stomach/Bowel: Sliding-type hiatal hernia. 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 There is irregular stranding and thickening adjacent the level of the patulous gastrojejunal anastomosis with surrounding mesenteric haze. No extraluminal gas is seen. No significant thickening or inflammation is seen upon the biliopancreatic limb. No distal thickening, obstruction or inflammation.  Some fecalized distal small bowel contents, nonspecific. Appendix is not visualized. No focal inflammation the vicinity of the cecum to suggest an occult appendicitis. Some mild colonic thickening in the region of the distal sigmoid diverticulosis without active inflammation may reflect sequela of prior diverticulitis. No other significant colonic thickening or dilatation accounting for underdistention. No evidence of obstruction. Vascular/Lymphatic: Atherosclerotic calcifications within the abdominal aorta and branch vessels. No aneurysm or ectasia. No enlarged abdominopelvic lymph nodes. Reproductive: Uterus is surgically absent. No concerning adnexal lesions. No concerning adnexal lesions. Other: Inflammatory changes centered at the gastrojejunal anastomosis, as above. Fat containing ventral hernia with some stranding of the herniated fat contents though overall appearance is similar to comparison  exam. Fascial defect of 1.2 by 1.2 cm. Tiny fat containing umbilical hernia as well. No bowel containing hernia. No abdominopelvic free air or fluid. Pelvic floor laxity similar to prior. Musculoskeletal: The osseous structures appear diffusely demineralized which may limit detection of small or nondisplaced fractures. Remote fractures of the right superior ramus/pubic root and inferior ramus/pubic body. No acute fracture or traumatic osseous injury. Prior L5-S1 PLIF with posterior translation of the interbody fusion cage with resulting compression upon the spinal canal which is similar to comparison imaging as remote is 2015. Screw and fixation rod constructs are intact without acute complication. Grade 2 anterolisthesis across this level is unchanged from comparison. Sclerotic vertebra plana deformity of T9 is unchanged from prior. IMPRESSION: 1. Remote retro colic gastric bypass with inflammatory changes centered at the level of the patulous gastrojejunal anastomosis with surrounding mesenteric haze. No extraluminal gas is seen. May reflect development of a marginal ulcer. Location may be amenable to direct visualization. No evidence of obstruction or concerning abnormality of the biliopancreatic limb. 2. Sliding-type hiatal hernia. 3. Gallbladder distension without visible calcified gallstones. Correlate with LFTs and if there is clinical concern for acute cholecystitis, consider further evaluation with right upper quadrant ultrasound. 4. Slightly asymmetric bladder wall thickening. May in part be attributable to some underdistention. Recommend correlation with urinalysis to exclude cystitis and outpatient cystoscopy if not previously performed. 5. Some mild colonic thickening in the region of the distal sigmoid diverticulosis without active inflammation may reflect sequela of prior diverticulitis. 6. Remote fractures of the right superior/pubic root and inferior ramus/pubic body. 7. Prior L5-S1 PLIF with  posterior translation of the interbody fusion cage with resulting compression upon the spinal canal which is similar to comparison imaging as remote is 2015. Grade 2 anterolisthesis across this level is unchanged from prior. 8. Sclerotic vertebra plana deformity of T9 is unchanged from prior. 9. Aortic Atherosclerosis (ICD10-I70.0). Electronically Signed   By: Kreg Shropshire M.D.   On: 01/30/2020 02:57    EKG: I independently viewed the EKG done and my findings are as followed: EKG was not done in the ED  Assessment/Plan Present on Admission: . Marginal ulcer . Epigastric pain . Hypertension . Chronic back pain . Smoker . Generalized anxiety disorder . Hyperlipidemia  Principal Problem:   Epigastric pain Active Problems:   Hypertension   Chronic back pain   Hyperlipidemia   Hiatal hernia   Smoker   Generalized anxiety disorder   Marginal ulcer   Noncompliance with medication regimen   AKI (acute kidney injury) (HCC)   Dehydration   Hypoalbuminemia   Obesity (BMI 30.0-34.9)   Nausea  Abdominal pain (epigastric) possibly secondary to marginal ulcer with noncompliance with medication regimen History of sliding hernia IV Protonix 80 Mg x1 was given Continue Protonix 40 mg  p.o. twice daily Continue Carafate IV morphine 2 mg every 4 hours as needed for moderate/severe pain  Nausea No IV Zofran 4 mg every 6 hours as needed  Acute kidney injury BUN to creatinine 29/1.75 (baseline creatinine at 0.9-1.2) Renally adjust medications, avoid nephrotoxic agents/dehydration/hypotension  Dehydration Continue IV hydration  Acute respiratory failure with hypoxia Patient's O2 sat was noted to drift down to upper 80s on room air, mostly when asleep Supplemental oxygen via [place] at 2 LPM was provided with improvement in O2 sat to 97%. Its possible that patient call have COPD considering history of tobacco abuse Patient may need to follow-up with PCP for an outpatient PFT  Essential  hypertension (uncontrolled) Continue IV labetalol 5 mg every 6 hours as needed for SBP > 170  Hyperlipidemia Continue home Lipitor  Chronic back pain  IV morphine 2 mg every 4 hours as needed for moderate/severe pain  Hypoalbuminemia possibly secondary to mild protein calorie malnutrition Albumin 3.2, protein supplement provided  Obesity (BMI 33.07) Counseled on diet and lifestyle modification  Tobacco abuse Patient has not smoked cigarettes since last admission, however, she continues to vape. She was counseled on vape cessation  DVT prophylaxis: Heparin subcu  Code Status: Full code  Family Communication: Daughter at bedside (all questions answered to satisfaction)  Disposition Plan:  Patient is from:                        home Anticipated DC to:                   home Anticipated DC date:                1 day Anticipated DC barriers:           Patient is unstable to be discharged at this time due to consistent abdominal pain possibly secondary to marginal ulcer and acute kidney injury which requires inpatient management   Consults called: None  Admission status: Observation    Frankey Shown MD [facility]  01/30/2020, 4:39 AM

## 2020-01-30 NOTE — ED Notes (Signed)
Pt's O2 sats dropped to 87% while sleeping, pt placed on O2 @ 2L via [place] and O2 sats increased to 97%;

## 2020-01-30 NOTE — TOC Initial Note (Signed)
Transition of Care [facility]) - Initial/Assessment Note    Patient Details  Name: Brenda Hopkins MRN: [id number] Date of Birth: January 26, 1941  Transition of Care [facility]) CM/SW Contact:    Karn Cassis, LCSW Phone Number: 01/30/2020, 4:04 PM  Clinical Narrative:  Pt in observation due to abdominal pain. Pt lives alone and reports she is fairly independent at baseline. Pt said she receives Me[employer] on Wheels and her daughter does grocery shopping for her and provides transportation. RN completed sat qualification note- pt does not need home O2. LCSW discussed discharge plan. PT pending. Pt states she would be open to home health, but is not interested in SNF. LCSW sent message to all home health agencies as pt has [employer]. [facility] considering. TOC will follow up after PT evaluation.                  Expected Discharge Plan: Home w Home Health Services Barriers to Discharge: Continued Medical Work up   Patient Goals and CMS Choice Patient states their goals for this hospitalization and ongoing recovery are:: return home   Choice offered to / list presented to : Patient  Expected Discharge Plan and Services Expected Discharge Plan: Home w Home Health Services In-house Referral: Clinical Social Work   Post Acute Care Choice: Home Health Living arrangements for the past 2 months: Single Family Home                 DME Arranged: N/A DME Agency: NA                  Prior Living Arrangements/Services Living arrangements for the past 2 months: Single Family Home Lives with:: Self Patient language and need for interpreter reviewed:: Yes Do you feel safe going back to the place where you live?: Yes      Need for Family Participation in Patient Care: No (Comment)   Current home services: DME, Meals on wheels (walker, wheelchair, 3N1) Criminal Activity/Legal Involvement Pertinent to Current Situation/Hospitalization: No - Comment as needed  Activities of Daily  Living Home Assistive Devices/Equipment: Bedside commode/3-in-1, Cane (specify quad or straight), Shower chair without back ADL Screening (condition at time of admission) Patient's cognitive ability adequate to safely complete daily activities?: Yes Is the patient deaf or have difficulty hearing?: No Does the patient have difficulty seeing, even when wearing glasses/contacts?: No Does the patient have difficulty concentrating, remembering, or making decisions?: No Patient able to express need for assistance with ADLs?: Yes Does the patient have difficulty dressing or bathing?: Yes Independently performs ADLs?: No Communication: Independent Dressing (OT): Needs assistance Is this a change from baseline?: Pre-admission baseline Grooming: Needs assistance Is this a change from baseline?: Pre-admission baseline Feeding: Independent Bathing: Needs assistance Is this a change from baseline?: Pre-admission baseline Toileting: Independent In/Out Bed: Independent, Independent with device (comment) Walks in Home: Independent with device (comment) Does the patient have difficulty walking or climbing stairs?: No Weakness of Legs: None Weakness of Arms/Hands: None  Permission Sought/Granted                  Emotional Assessment   Attitude/Demeanor/Rapport: Engaged Affect (typically observed): Accepting Orientation: : Oriented to Self, Oriented to Place, Oriented to  Time, Oriented to Situation Alcohol / Substance Use: Not Applicable Psych Involvement: No (comment)  Admission diagnosis:  Abdominal pain, generalized [R10.84] Dehydration [E86.0] Marginal ulcer [K28.9] AKI (acute kidney injury) (HCC) [N17.9] Patient Active Problem List   Diagnosis Date Noted  .  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 Noncompliance with medication regimen 01/30/2020  . AKI (acute kidney injury) (HCC) 01/30/2020  . Dehydration 01/30/2020  . Hypoalbuminemia 01/30/2020  . Obesity (BMI 30.0-34.9) 01/30/2020  .  Nausea 01/30/2020  . Acute respiratory failure with hypoxia (HCC) 01/30/2020  . Epigastric pain 01/16/2020  . Ventral hernia without obstruction or gangrene   . Intractable epigastric abdominal pain 01/15/2020  . Insomnia 05/13/2014  . Generalized anxiety disorder 07/15/2013  . Hypertension   . Chronic back pain   . Hyperlipidemia   . GERD (gastroesophageal reflux disease)   . Hiatal hernia   . Smoker   . Pelvic fracture (HCC) 12/12/2012   PCP:  Shawnie Dapper, PA-C Pharmacy:   [facility] [phone] - [place], [place] - [address] SEC [address] [place] [address] Phone: [phone] Fax: [phone]     Social Determinants of Health (SDOH) Interventions    Readmission Risk Interventions No flowsheet data found.

## 2020-01-30 NOTE — ED Notes (Signed)
Dr. Adefeso in with pt at this time ° °

## 2020-01-30 NOTE — Progress Notes (Signed)
Patient seen and evaluated, chart reviewed, please see EMR for updated orders. Please see full H&P dictated by admitting physician Dr. Thomes Dinning for same date of service.    Brief Summary:- 79 y.o. female with medical history significant for hypertension, anxiety, marginal ulcer, tobacco abuse and chronic pain admitted on 01/30/2020 with worsening abdominal pain after being recently discharged from the hospital post EGD    A/p 1)Abd Pain/Epigastric pain--CT abdomen and pelvis showing remote retro colic gastric bypass with inflammatory changes centered at the level of the patulous gastrojejunal anastomosis with surrounding mesenteric haze. No extraluminal gas is seen. May nreflect development of a marginal ulcer.  -EGD from 01/17/20 shows--- Roux-en-Y gastrojejunostomy with gastrojejunal anastomosis characterized by congestion and erythema with a large anastomotic ulcer (jejunal side -Post discharge patient admits to noncompliance with PPI and Carafate -Discussed with GI service no plans for endoluminal evaluation at this time -The need to be compliant with medications emphasized to patient -Continue PPI and Carafate  2)Acute hypoxic respiratory failure hypoxia while patient sleeps----02 sats down to 86 to 87 % when patient falls asleep--- will  do nocturnal oxygen desaturation study tonight--- NOD study --- RN and respiratory therapist to do pulse sats Overnight --O2 sats on room air while at rest and with ambulation stays at or above 90% as long as patient is awake -  3)COPD--recently quit tobacco but continues to vape --- This may be contributing to #2 above -Continue bronchodilators---  4)Hypoalbuminemia possibly secondary to mild protein calorie malnutrition Albumin 3.2, protein supplement provided  5)HTN--- continue amlodipine and lisinopril,   may use IV labetalol when necessary  Every 4 hours for systolic blood pressure over 160 mmhg  6)Generalized weakness and deconditioning---  physical therapy eval requested  --Total care time 46 minutes  - Patient seen and evaluated, chart reviewed, please see EMR for updated orders. Please see full H&P dictated by admitting physician Dr. Thomes Dinning for same date of service.   Shon Hale, MD

## 2020-01-31 DIAGNOSIS — R1013 Epigastric pain: Secondary | ICD-10-CM | POA: Diagnosis not present

## 2020-01-31 LAB — BASIC METABOLIC PANEL
Anion gap: 10 (ref 5–15)
BUN: 23 mg/dL (ref 8–23)
CO2: 28 mmol/L (ref 22–32)
Calcium: 8.7 mg/dL — ABNORMAL LOW (ref 8.9–10.3)
Chloride: 95 mmol/L — ABNORMAL LOW (ref 98–111)
Creatinine, Ser: 0.91 mg/dL (ref 0.44–1.00)
GFR, Estimated: >60 mL/min (ref >60)
Glucose, Bld: 148 mg/dL — ABNORMAL HIGH (ref 70–99)
Potassium: 3.4 mmol/L — ABNORMAL LOW (ref 3.5–5.1)
Sodium: 133 mmol/L — ABNORMAL LOW (ref 135–145)

## 2020-01-31 LAB — CBC
HCT: 37 % (ref 36.0–46.0)
Hemoglobin: 11.1 g/dL — ABNORMAL LOW (ref 12.0–15.0)
MCH: 26.6 pg (ref 26.0–34.0)
MCHC: 30 g/dL (ref 30.0–36.0)
MCV: 88.5 fL (ref 80.0–100.0)
Platelets: 496 10*3/uL — ABNORMAL HIGH (ref 150–400)
RBC: 4.18 MIL/uL (ref 3.87–5.11)
RDW: 15.8 % — ABNORMAL HIGH (ref 11.5–15.5)
WBC: 6.5 10*3/uL (ref 4.0–10.5)
nRBC: 0 % (ref 0.0–0.2)

## 2020-01-31 LAB — URINE CULTURE

## 2020-01-31 LAB — MAGNESIUM: Magnesium: 1.6 mg/dL — ABNORMAL LOW (ref 1.7–2.4)

## 2020-01-31 MED ORDER — SUCRALFATE 1 G PO TABS: 1.0000 g | ORAL_TABLET | Freq: Two times a day (BID) | ORAL | 0 refills | Status: DC

## 2020-01-31 MED ORDER — ONDANSETRON HCL 4 MG PO TABS: 4.0000 mg | ORAL_TABLET | Freq: Four times a day (QID) | ORAL | 0 refills | Status: DC

## 2020-01-31 MED ORDER — POTASSIUM CHLORIDE CRYS ER 20 MEQ PO TBCR
40.0000 meq | EXTENDED_RELEASE_TABLET | ORAL | Status: DC
  Administered 2020-01-31: 40 meq via ORAL
  Filled 2020-01-31: qty 2

## 2020-01-31 MED ORDER — PANTOPRAZOLE SODIUM 40 MG PO TBEC: 40.0000 mg | DELAYED_RELEASE_TABLET | Freq: Two times a day (BID) | ORAL | 2 refills | Status: DC

## 2020-01-31 MED ORDER — MAGNESIUM SULFATE 4 GM/100ML IV SOLN
4.0000 g | Freq: Once | INTRAVENOUS | Status: AC
  Administered 2020-01-31: 4 g via INTRAVENOUS
  Filled 2020-01-31: qty 100

## 2020-01-31 NOTE — Discharge Instructions (Signed)
1)Avoid ibuprofen/Advil/Aleve/Motrin/Goody Powders/Naproxen/BC powders/Meloxicam/Diclofenac/Indomethacin and other Nonsteroidal anti-inflammatory medications as these will make you more likely to bleed and can cause stomach ulcers, can also cause Kidney problems.   2) please take Protonix and Carafate as prescribed to treat your stomach/bowel ulcers  3) follow-up to primary care physician in 1 to 2 weeks for recheck and reevaluation was advised

## 2020-01-31 NOTE — Progress Notes (Signed)
Discussed with hospitalist. Issues related to PPI/Carafate non-compliance. We will sign off for now, will remove consult order. Please contact us and enter a new consult order if we can be of further assistance.   Thank you for allowing [place] to participate in the care of Brenda Pagan, DNP, AGNP-C Adult & Gerontological Nurse Practitioner [facility]

## 2020-01-31 NOTE — Plan of Care (Signed)
  Problem: Acute Rehab PT Goals(only PT should resolve) Goal: Patient Will Transfer Sit To/From Stand Outcome: Progressing Flowsheets (Taken 01/31/2020 0859) Patient will transfer sit to/from stand: with supervision Goal: Pt Will Transfer Bed To Chair/Chair To Bed Outcome: Progressing Flowsheets (Taken 01/31/2020 0859) Pt will Transfer Bed to Chair/Chair to Bed: with supervision Goal: Pt Will Ambulate Outcome: Progressing Flowsheets (Taken 01/31/2020 0859) Pt will Ambulate:  25 feet  with min guard assist  with rolling walker Goal: Pt/caregiver will Perform Home Exercise Program Outcome: Progressing Flowsheets (Taken 01/31/2020 0859) Pt/caregiver will Perform Home Exercise Program:  For increased strengthening  For improved balance  Independently   9:00 AM, 01/31/20 Wyman Songster PT, DPT Physical Therapist at [facility]

## 2020-01-31 NOTE — Care Management Obs Status (Signed)
MEDICARE OBSERVATION STATUS NOTIFICATION   Patient Details  Name: Brenda Hopkins MRN: [id number] Date of Birth: Aug 26, 1940   Medicare Observation Status Notification Given:  Yes    Corey Harold 01/31/2020, 10:39 AM

## 2020-01-31 NOTE — TOC Transition Note (Signed)
Transition of Care [facility]) - CM/SW Discharge Note   Patient Details  Name: Brenda Hopkins MRN: [id number] Date of Birth: 1941/01/18  Transition of Care [facility] CM/SW Contact:  Barry Brunner, LCSW Phone Number: 01/31/2020, 3:18 PM   Clinical Narrative:    CSW received order for HHPT and RN. CSW contacted Denyse Amass with [facility]. Denyse Amass agreeable to provide [facility] services. TOC signing off.    Final next level of care: Home w Home Health Services Barriers to Discharge: Barriers Resolved   Patient Goals and CMS Choice Patient states their goals for this hospitalization and ongoing recovery are:: Return home with [facility] CMS Medicare.gov Compare Post Acute Care list provided to:: Patient Choice offered to / list presented to : Patient  Discharge Placement                Patient to be transferred to facility by: N/A Name of family member notified: N/A Patient and family notified of of transfer: 01/31/20  Discharge Plan and Services In-house Referral: Clinical Social Work   Post Acute Care Choice: Home Health          DME Arranged: N/A DME Agency: NA       HH Arranged: RN, PT HH Agency: [facility] Date [facility] Agency Contacted: 01/31/20 Time HH Agency Contacted: [phone] Representative spoke with at [facility] Agency: Denyse Amass  Social Determinants of Health (SDOH) Interventions     Readmission Risk Interventions No flowsheet data found.

## 2020-01-31 NOTE — Care Management Obs Status (Signed)
MEDICARE OBSERVATION STATUS NOTIFICATION   Patient Details  Name: Brenda Hopkins MRN: [id number] Date of Birth: 07-30-1940   Medicare Observation Status Notification Given:       Corey Harold 01/31/2020, 10:39 AM

## 2020-01-31 NOTE — Discharge Summary (Signed)
Brenda Hopkins, is a 79 y.o. female  DOB [birth year]  MRN [id number].  Admission date:  01/29/2020  Admitting Physician  Frankey Shown, DO  Discharge Date:  01/31/2020   Primary MD  Shawnie Dapper, PA-C  Recommendations for primary care physician for things to follow:  1)Avoid ibuprofen/Advil/Aleve/Motrin/Goody Powders/Naproxen/BC powders/Meloxicam/Diclofenac/Indomethacin and other Nonsteroidal anti-inflammatory medications as these will make you more likely to bleed and can cause stomach ulcers, can also cause Kidney problems.   2) please take Protonix and Carafate as prescribed to treat your stomach/bowel ulcers  3) follow-up to primary care physician in 1 to 2 weeks for recheck and reevaluation was advised  Admission Diagnosis  Abdominal pain, generalized [R10.84] Dehydration [E86.0] Marginal ulcer [K28.9] AKI (acute kidney injury) (HCC) [N17.9]   Discharge Diagnosis  Abdominal pain, generalized [R10.84] Dehydration [E86.0] Marginal ulcer [K28.9] AKI (acute kidney injury) (HCC) [N17.9]    Principal Problem:   Epigastric pain Active Problems:   Gi Tract---Marginal ulcer   Noncompliance with medication regimen   Acute respiratory failure with hypoxia (HCC)   Smoker   Hypertension   Chronic back pain   Hyperlipidemia   Hiatal hernia   Generalized anxiety disorder   AKI (acute kidney injury) (HCC)   Dehydration   Hypoalbuminemia   Obesity (BMI 30.0-34.9)   Nausea      Past Medical History:  Diagnosis Date  . Chronic back pain   . GERD (gastroesophageal reflux disease)   . Hiatal hernia   . Hyperlipidemia   . Hypertension   . Pelvic fracture (HCC) 12/12/2012  . Smoker     Past Surgical History:  Procedure Laterality Date  . ABDOMINAL HYSTERECTOMY    . APPENDECTOMY    . ESOPHAGOGASTRODUODENOSCOPY (EGD) WITH PROPOFOL N/A 01/17/2020   Procedure: ESOPHAGOGASTRODUODENOSCOPY  (EGD) WITH PROPOFOL;  Surgeon: Dolores Frame, MD;  Location: [facility];  Service: Gastroenterology;  Laterality: N/A;  . 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 HEMORROIDECTOMY    . LUMBAR SPINE SURGERY    . OOPHORECTOMY    . TONSILLECTOMY       HPI  from the history and physical done on the day of admission:     Chief Complaint: Abdominal Pain  HPI: Brenda Hopkins is a 79 y.o. female with medical history significant for hypertension, anxiety, marginal ulcer, tobacco abuse and chronic pain who presents to the ED due to constant sharp and occasional dull pain with radiation to the chest that has been ongoing since being discharged from the hospital (admitted from 11/3-11/7) due to intractable epigastric abdominal pain, pain was rated as 10/10 on pain scale with no known alleviating/aggravating factor.  Patient states that she has had decreased oral intake due to the abdominal pain and associated nausea.  During the hospitalization, EGD was done and patient was noted to have a marginal ulcer, she was discharged home on PPI/Carafate and instructed to follow-up with PCP to wean medication and also to follow-up with GI for repeat EGD.  Apparently, patient has not been taking  the Protonix since discharge from the hospital.  Patient has not been taking NSAIDs since discharge per daughter at bedside.  She denies, fever, chills, headache, chest pain, diarrhea or constipation.  ED Course:  In the emergency department, she was initially tachycardic, but this improved over time.  BP was 163/109 and she was initially hypoxic at O2 sat of 83%, but O2 sat at bedside was 92% on room air.  Work-up in the ED showed leukocytosis, thrombocytosis, hyperglycemia, BUN to creatinine 29/1.75 (baseline creatinine at 0.9-1.2).  Troponin x2 18> 13, albumin 3.2.  CT abdomen pelvis without contrast showed marginal ulcer and sliding-type hiatal hernia.  Gallbladder distention without visible calcified gallstones was  also noted.  She was treated with IV Dilaudid, IV Protonix 80 Mg x1 was given, IV Zofran four Mg x1 was also given and patient was provided with IV hydration.  Hospitalist was asked to admit patient for further evaluation and management.    Hospital Course:    Brief Summary:- 79 y.o.femalewith medical history significant forhypertension, anxiety, marginalulcer, tobacco abuseandchronic pain admitted on 01/30/2020 with worsening abdominal pain after being recently discharged from the hospital post EGD   A/p 1)Abd Pain/Epigastric pain--CT abdomen and pelvis showing remote retro colic gastric bypass with inflammatory changes centered at the level of the patulous gastrojejunal anastomosis with surrounding mesenteric haze. No extraluminal gas is seen. May nreflect development of a marginal ulcer.  -EGD from 01/17/20 shows--- Roux-en-Y gastrojejunostomy with gastrojejunal anastomosis characterized by congestion and erythema with a large anastomotic ulcer (jejunal side -Post discharge patient admits to noncompliance with PPI and Carafate -Discussed with GI service no plans for endoluminal evaluation at this time -The need to be compliant with medications emphasized to patient -Continue PPI and Carafate  -We will discharge patient home with home health RN to try to improve medication compliance also discussed with patient's daughter to encourage patient to improve medication compliance  2)Acute hypoxic respiratory failure hypoxia while patient sleeps----02 sats down to 86 to 87 % when patient falls asleep--- hypoxia resolved --- --O2 sats on room air while at rest and with ambulation stays at or above 90% as long as patient is awake - 3)COPD--recently quit tobacco but continues to vape --- This may be contributing to #2 above -Continue bronchodilators---  4)Hypoalbuminemia possiblysecondary to mild protein calorie malnutrition Albumin 3.2, protein supplement recommended  5)HTN---  continue amlodipine and lisinopril,   6)Generalized weakness and deconditioning--- physical therapy eval appreciated, they recommended SNF rehab, patient refused SNF rehab, she is willing to try home health PT -Discussed with patient's daughter concerning risk of fall, patient's daughter okay with patient coming home with home health PT  Discharge Condition: stable  Follow UP   Follow-up Information    [facility] Follow [facility].   Specialty: Home Health Services Why: PT & RN  Contact information: [address] [place] [address] [phone]        Shawnie Dapper, PA-C. Schedule an appointment as soon as possible for a visit in 1 week(s).   Specialties: Physician Assistant, Internal Medicine Why: For recheck and reevaluation and repeat CBC Contact information: [address] [place] [address] [phone]                Consults obtained - na  Diet and Activity recommendation:  As advised  Discharge Instructions   Discharge Instructions    Call MD for:  difficulty breathing, headache or visual disturbances   Complete by: As directed  Call MD for:  persistant dizziness or light-headedness   Complete by: As directed    Call MD for:  persistant nausea and vomiting   Complete by: As directed    Call MD for:  severe uncontrolled pain   Complete by: As directed    Call MD for:  temperature >100.4   Complete by: As directed    Diet - low sodium heart healthy   Complete by: As directed    Discharge instructions   Complete by: As directed    1)Avoid ibuprofen/Advil/Aleve/Motrin/Goody Powders/Naproxen/BC powders/Meloxicam/Diclofenac/Indomethacin and other Nonsteroidal anti-inflammatory medications as these will make you more likely to bleed and can cause stomach ulcers, can also cause Kidney problems.   2) please take Protonix and Carafate as prescribed to treat your stomach/bowel ulcers  3) follow-up to primary care  physician in 1 to 2 weeks for recheck and reevaluation was advised   Increase activity slowly   Complete by: As directed         Discharge Medications     Allergies as of 01/31/2020      Reactions   Prozac [fluoxetine Hcl] Other (See Comments)   Makes her fell crazy   Zoloft [sertraline Hcl]    Made her feel crazy--10/2013   Nicoderm [nicotine] Rash   Site rash      Medication List    STOP taking these medications   diphenoxylate-atropine 2.5-0.025 MG tablet Commonly known as: Lomotil     TAKE these medications   acetaminophen 325 MG tablet Commonly known as: TYLENOL Take 2 tablets (650 mg total) by mouth every 6 (six) hours as needed for mild pain, moderate pain or headache (or Fever >/= 101).   amLODipine 10 MG tablet Commonly known as: NORVASC TAKE 1 TABLET BY MOUTH EVERY DAY   atorvastatin 20 MG tablet Commonly known as: LIPITOR TAKE 1 TABLET BY MOUTH EVERY NIGHT AT BEDTIME   gabapentin 600 MG tablet Commonly known as: NEURONTIN TAKE 1 TABLET BY MOUTH THREE TIMES DAILY   lisinopril 10 MG tablet Commonly known as: ZESTRIL Take 10 mg by mouth daily.   ondansetron 4 MG tablet Commonly known as: ZOFRAN Take 1 tablet (4 mg total) by mouth every 6 (six) hours.   oxyCODONE 15 MG immediate release tablet Commonly known as: ROXICODONE Take 15 mg by mouth 4 (four) times daily.   pantoprazole 40 MG tablet Commonly known as: Protonix Take 1 tablet (40 mg total) by mouth 2 (two) times daily. What changed: Another medication with the same name was removed. Continue taking this medication, and follow the directions you see here.   potassium chloride SA 20 MEQ tablet Commonly known as: KLOR-CON Take 20 mEq by mouth 2 (two) times daily.   QUEtiapine 200 MG tablet Commonly known as: SEROQUEL Take 200 mg by mouth at bedtime.   rOPINIRole 3 MG tablet Commonly known as: REQUIP Take 3 mg by mouth at bedtime.   sucralfate 1 g tablet Commonly known as:  CARAFATE Take 1 tablet (1 g total) by mouth 2 (two) times daily.       Major procedures and Radiology Reports - PLEASE review detailed and final reports for all details, in brief -    CT Abdomen Pelvis Wo Contrast  Result Date: 01/30/2020 CLINICAL DATA:  Several days of epigastric pain, acute onset, moderate constant and dull radiating across abdomen and appendage chest EXAM: CT ABDOMEN AND PELVIS WITHOUT CONTRAST TECHNIQUE: Multidetector CT imaging of the abdomen and pelvis was performed following the standard  protocol without IV contrast. COMPARISON:  CT 01/13/2020, 01/04/2020, lumbar radiographs 02/10/2014 FINDINGS: Lower chest: Hypoattenuation of the cardiac blood pool relative to the myocardium compatible with mild anemia. Three-vessel coronary artery atherosclerosis. Dense calcifications upon the mitral annulus and aortic leaflets. Suspect lipomatous hypertrophy of the interatrial septum. No pericardial effusion. Areas of scarring and architectural distortion present in the bases. Some additional atelectatic changes are present. Few nonunited posterior right rib fractures are seen. Hepatobiliary: No visible focal liver lesion within limitations of an unenhanced CT. Smooth liver surface contour. Normal hepatic attenuation. Colonic interposition anterior to the liver. Moderate gallbladder distension measuring up to 6.3 cm in diameter and nearly 11 cm in length. No pericholecystic fluid or inflammation. No visible calcified gallstones. No biliary ductal dilatation. Pancreas: No pancreatic ductal dilatation or surrounding inflammatory changes. Spleen: Normal in size. No concerning splenic lesions. Adrenals/Urinary Tract: Lobular thickening of the adrenal glands without concerning dominant nodule. Can reflect senescent adrenal hyperplasia. Kidneys are symmetric in size and normally located. Stable mild bilateral perinephric stranding is nonspecific particularly given chronicity. Lobular margins of the  kidneys could reflect persistent fetal lobulation or cortical scarring unchanged from prior. Vascular calcifications in the renal pelves. No obstructive urolithiasis or hydronephrosis. No contour deforming or worrisome renal lesions are seen. Slightly asymmetric bladder wall thickening. May in part be attributable to some underdistention. No bladder calculi or debris. Stomach/Bowel: Sliding-type hiatal hernia. 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 There is irregular stranding and thickening adjacent the level of the patulous gastrojejunal anastomosis with surrounding mesenteric haze. No extraluminal gas is seen. No significant thickening or inflammation is seen upon the biliopancreatic limb. No distal thickening, obstruction or inflammation. Some fecalized distal small bowel contents, nonspecific. Appendix is not visualized. No focal inflammation the vicinity of the cecum to suggest an occult appendicitis. Some mild colonic thickening in the region of the distal sigmoid diverticulosis without active inflammation may reflect sequela of prior diverticulitis. No other significant colonic thickening or dilatation accounting for underdistention. No evidence of obstruction. Vascular/Lymphatic: Atherosclerotic calcifications within the abdominal aorta and branch vessels. No aneurysm or ectasia. No enlarged abdominopelvic lymph nodes. Reproductive: Uterus is surgically absent. No concerning adnexal lesions. No concerning adnexal lesions. Other: Inflammatory changes centered at the gastrojejunal anastomosis, as above. Fat containing ventral hernia with some stranding of the herniated fat contents though overall appearance is similar to comparison exam. Fascial defect of 1.2 by 1.2 cm. Tiny fat containing umbilical hernia as well. No bowel containing hernia. No abdominopelvic free air or fluid. Pelvic floor laxity similar to prior. Musculoskeletal: The osseous structures appear diffusely  demineralized which may limit detection of small or nondisplaced fractures. Remote fractures of the right superior ramus/pubic root and inferior ramus/pubic body. No acute fracture or traumatic osseous injury. Prior L5-S1 PLIF with posterior translation of the interbody fusion cage with resulting compression upon the spinal canal which is similar to comparison imaging as remote is 2015. Screw and fixation rod constructs are intact without acute complication. Grade 2 anterolisthesis across this level is unchanged from comparison. Sclerotic vertebra plana deformity of T9 is unchanged from prior. IMPRESSION: 1. Remote retro colic gastric bypass with inflammatory changes centered at the level of the patulous gastrojejunal anastomosis with surrounding mesenteric haze. No extraluminal gas is seen. May reflect development of a marginal ulcer. Location may be amenable to direct visualization. No evidence of obstruction or concerning abnormality of the biliopancreatic limb. 2. Sliding-type hiatal hernia. 3. Gallbladder distension without visible calcified  gallstones. Correlate with LFTs and if there is clinical concern for acute cholecystitis, consider further evaluation with right upper quadrant ultrasound. 4. Slightly asymmetric bladder wall thickening. May in part be attributable to some underdistention. Recommend correlation with urinalysis to exclude cystitis and outpatient cystoscopy if not previously performed. 5. Some mild colonic thickening in the region of the distal sigmoid diverticulosis without active inflammation may reflect sequela of prior diverticulitis. 6. Remote fractures of the right superior/pubic root and inferior ramus/pubic body. 7. Prior L5-S1 PLIF with posterior translation of the interbody fusion cage with resulting compression upon the spinal canal which is similar to comparison imaging as remote is 2015. Grade 2 anterolisthesis across this level is unchanged from prior. 8. Sclerotic vertebra  plana deformity of T9 is unchanged from prior. 9. Aortic Atherosclerosis (ICD10-I70.0). Electronically Signed   By: Kreg Shropshire M.D.   On: 01/30/2020 02:57   CT ABDOMEN PELVIS W CONTRAST  Result Date: 01/13/2020 CLINICAL DATA:  Suspected diverticulitis in a patient with worsening abdominal pain. Also presenting with lower chest pain that been going on for the past 6 days. Dull constant pain. EXAM: CT ABDOMEN AND PELVIS WITH CONTRAST TECHNIQUE: Multidetector CT imaging of the abdomen and pelvis was performed using the standard protocol following bolus administration of intravenous contrast. CONTRAST:  OMNIPAQUE IOHEXOL 300 MG/ML  SOLN COMPARISON:  CT angiography 01/04/2020 chest abdomen pelvis. FINDINGS: Lower chest: No focal consolidation within the visualized lungs. Severe four-vessel coronary artery calcifications. At least mild mitral annular calcifications. Small hiatal hernia. Hepatobiliary: Hepatic steatosis. No focal liver abnormality. Nonspecific hydropic gallbladder measuring up to 6 cm on axial imaging. Otherwise no gallstones, gallbladder wall thickening, or pericholecystic fluid. No biliary dilatation. Pancreas: Diffusely atrophic. No focal lesion. Otherwise normal pancreatic contour. No surrounding inflammatory changes. No main pancreatic ductal dilatation. Spleen: Normal in size without focal abnormality. Adrenals/Urinary Tract: Hyperplastic right adrenal gland. Slightly hyperplastic left adrenal gland. No adrenal nodule bilaterally. Bilateral kidneys enhance symmetrically. No hydronephrosis. No hydroureter. The urinary bladder is unremarkable. Stomach/Bowel: Roux-en-Y gastric bypass. Stomach is within normal limits. No evidence of bowel wall thickening or dilatation. Diffuse sigmoid diverticulosis. The appendix is not visualized and likely surgically absent. Vascular/Lymphatic: No abdominal aorta or iliac aneurysm. Severe atherosclerotic plaque of the aorta and its branches. No abdominal,  pelvic, or inguinal lymphadenopathy. Reproductive: Uterus and bilateral adnexa are unremarkable. Other: No intraperitoneal free fluid. No intraperitoneal free gas. No organized fluid collection. Musculoskeletal: Moderate sized fat containing supra-umbilical ventral wall hernia with an abdominal defect of 1.2 cm. Subcutaneus soft tissue edema along the midline lower back at the L5-S1 level. No organized fluid collection. Diffusely decreased bone density. No suspicious lytic or blastic osseous lesions. No acute displaced fracture. Multilevel degenerative changes of the spine. Posterolateral fusion at the L5-S1 level with similar-appearing grade 2 anterolisthesis of L5 on S1. Compression fracture of the T9 vertebral body that appears similar to prior with complete height loss centrally. Old superior and inferior right pubic rami fractures. IMPRESSION: 1. Diffuse sigmoid diverticulosis with no acute diverticulitis. 2. Small hiatal hernia. 3. Moderate sized fat containing supra-umbilical ventral wall hernia with an abdominal defect of 1.2 cm. No findings suggest ischemia or bowel obstruction. 4. Hepatic steatosis. 5. Aortic Atherosclerosis (ICD10-I70.0) as well as severe coronary artery calcifications. 6. Chronic T9 compression fracture with 100% vertebral body height loss centrally. 7. Status post L5-S1 posterolateral fusion with stable grade 2 anterolisthesis at this level. Limited evaluation of central canal stenosis due to streak artifact originating from  the surgical hardware. Electronically Signed   By: Tish Frederickson M.D.   On: 01/13/2020 17:19   DG Chest Port 1 View  Result Date: 01/15/2020 CLINICAL DATA:  Abdominal and back pain, weakness EXAM: PORTABLE CHEST 1 VIEW COMPARISON:  01/13/2020 FINDINGS: Single frontal view of the chest demonstrates a stable cardiac silhouette. Chronic scarring throughout the lungs, most pronounced at the left lung base. No airspace disease, effusion, or pneumothorax. No acute  bony abnormalities. IMPRESSION: 1. Stable areas of scarring.  No acute airspace disease. Electronically Signed   By: Sharlet Salina M.D.   On: 01/15/2020 19:55   DG Chest Portable 1 View  Result Date: 01/13/2020 CLINICAL DATA:  Chest pain. EXAM: PORTABLE CHEST 1 VIEW COMPARISON:  CTA chest, abdomen, and pelvis dated January 04, 2020. FINDINGS: The heart size and mediastinal contours are within normal limits. Atherosclerotic calcification of the aortic arch. Normal pulmonary vascularity. Unchanged bibasilar scarring. No focal consolidation, pleural effusion, or pneumothorax. No acute osseous abnormality. Old right-sided rib fractures again noted. Advanced degenerative changes of the right shoulder. IMPRESSION: No active disease. Electronically Signed   By: Obie Dredge M.D.   On: 01/13/2020 12:33   ECHOCARDIOGRAM COMPLETE  Result Date: 01/16/2020    ECHOCARDIOGRAM REPORT   Patient Name:   Brenda Hopkins Date of Exam: 01/16/2020 Medical Rec #:  [id number]      Height:       61.0 in Accession #:    [id number]     Weight:       162.9 lb Date of Birth:  [birth year]      BSA:          1.731 m Patient Age:    79 years       BP:           112/66 mmHg Patient Gender: F              HR:           77 bpm. [facility] Procedure: 2D Echo Indications:    Chest Pain 786.50 / R07.9  History:        Patient has no prior history of Echocardiogram examinations.                 Signs/Symptoms:Chest Pain; Risk Factors:Hypertension, Former                 Smoker and Dyslipidemia. GERD.  Sonographer:    Jeryl Columbia RDCS (AE) Referring Phys: [id number] [facility] IMPRESSIONS  1. Left ventricular ejection fraction, by estimation, is 60 to 65%. The left ventricle has normal function. The left ventricle has no regional wall motion abnormalities. There is moderate left ventricular hypertrophy. Left ventricular diastolic parameters are consistent with Grade I diastolic dysfunction (impaired relaxation).  2. Right  ventricular systolic function is normal. The right ventricular size is normal.  3. The mitral valve is normal in structure. No evidence of mitral valve regurgitation. No evidence of mitral stenosis.  4. The aortic valve is tricuspid. There is mild calcification of the aortic valve. There is mild thickening of the aortic valve. Aortic valve regurgitation is not visualized. No aortic stenosis is present.  5. The inferior vena cava is normal in size with greater than 50% respiratory variability, suggesting right atrial pressure of 3 mmHg. FINDINGS  Left Ventricle: Left ventricular ejection fraction, by estimation, is 60 to 65%. The left ventricle has normal function. The left ventricle has no regional wall motion abnormalities. The  left ventricular internal cavity size was normal in size. There is  moderate left ventricular hypertrophy. Left ventricular diastolic parameters are consistent with Grade I diastolic dysfunction (impaired relaxation). Normal left ventricular filling pressure. Right Ventricle: The right ventricular size is normal. No increase in right ventricular wall thickness. Right ventricular systolic function is normal. Left Atrium: Left atrial size was normal in size. Right Atrium: Right atrial size was normal in size. Pericardium: There is no evidence of pericardial effusion. Mitral Valve: The mitral valve is normal in structure. There is mild thickening of the mitral valve leaflet(s). There is mild calcification of the mitral valve leaflet(s). Mild mitral annular calcification. No evidence of mitral valve regurgitation. No evidence of mitral valve stenosis. Tricuspid Valve: The tricuspid valve is normal in structure. Tricuspid valve regurgitation is trivial. No evidence of tricuspid stenosis. Aortic Valve: The aortic valve is tricuspid. There is mild calcification of the aortic valve. There is mild thickening of the aortic valve. There is mild aortic valve annular calcification. Aortic valve  regurgitation is not visualized. No aortic stenosis  is present. Aortic valve mean gradient measures 7.0 mmHg. Aortic valve peak gradient measures 12.7 mmHg. Aortic valve area, by VTI measures 2.58 cm. Pulmonic Valve: The pulmonic valve was not well visualized. Pulmonic valve regurgitation is not visualized. No evidence of pulmonic stenosis. Aorta: The aortic root is normal in size and structure. Pulmonary Artery: Indeterminant PASP, inadequate TR jet. Venous: The inferior vena cava is normal in size with greater than 50% respiratory variability, suggesting right atrial pressure of 3 mmHg. IAS/Shunts: No atrial level shunt detected by color flow Doppler.  LEFT VENTRICLE PLAX 2D LVIDd:         4.84 cm  Diastology LVIDs:         2.66 cm  LV e' medial:    10.80 cm/s LV PW:         1.20 cm  LV E/e' medial:  7.3 LV IVS:        1.24 cm  LV e' lateral:   5.98 cm/s LVOT diam:     2.20 cm  LV E/e' lateral: 13.1 LV SV:         93 LV SV Index:   54 LVOT Area:     3.80 cm  RIGHT VENTRICLE RV S prime:     16.30 cm/s TAPSE (M-mode): 1.8 cm LEFT ATRIUM             Index       RIGHT ATRIUM           Index LA diam:        5.00 cm 2.89 cm/m  RA Area:     12.70 cm LA Vol (A2C):   53.4 ml 30.85 ml/m RA Volume:   35.50 ml  20.51 ml/m LA Vol (A4C):   49.3 ml 28.48 ml/m LA Biplane Vol: 51.9 ml 29.98 ml/m  AORTIC VALVE AV Area (Vmax):    2.43 cm AV Area (Vmean):   2.01 cm AV Area (VTI):     2.58 cm AV Vmax:           178.41 cm/s AV Vmean:          126.491 cm/s AV VTI:            0.361 m AV Peak Grad:      12.7 mmHg AV Mean Grad:      7.0 mmHg LVOT Vmax:         113.97 cm/s LVOT Vmean:  66.793 cm/s LVOT VTI:          0.245 m LVOT/AV VTI ratio: 0.68  AORTA Ao Root diam: 2.60 cm MITRAL VALVE                TRICUSPID VALVE MV Area (PHT): 2.33 cm     TR Peak grad:   28.1 mmHg MV Decel Time: 326 msec     TR Vmax:        265.00 cm/s MV E velocity: 78.40 cm/s MV A velocity: 124.00 cm/s  SHUNTS MV E/A ratio:  0.63         Systemic  VTI:  0.24 m                             Systemic Diam: 2.20 cm Dina Rich MD Electronically signed by Dina Rich MD Signature Date/Time: 01/16/2020/12:37:20 PM    Final    CT Angio Chest/Abd/Pel for Dissection W and/or W/WO  Result Date: 01/04/2020 CLINICAL DATA:  Chest pain and abdominal pain x6 days. EXAM: CT ANGIOGRAPHY CHEST, ABDOMEN AND PELVIS TECHNIQUE: Non-contrast CT of the chest was initially obtained. Multidetector CT imaging through the chest, abdomen and pelvis was performed using the standard protocol during bolus administration of intravenous contrast. Multiplanar reconstructed images and MIPs were obtained and reviewed to evaluate the vascular anatomy. CONTRAST:  OMNIPAQUE IOHEXOL 350 MG/ML SOLN COMPARISON:  None. FINDINGS: CTA CHEST FINDINGS Cardiovascular: There is moderate to marked severity calcification of the thoracic aorta, without evidence of aneurysmal dilatation. Satisfactory opacification of the pulmonary arteries to the segmental level. No evidence of pulmonary embolism. Normal heart size with marked severity coronary artery calcification. No pericardial effusion. Mediastinum/Nodes: No enlarged mediastinal, hilar, or axillary lymph nodes. Thyroid gland, trachea, and esophagus demonstrate no significant findings. Lungs/Pleura: Mild linear scarring and/or atelectasis is seen within the bilateral lung bases and inferior aspect of the left upper lobe. There is no evidence of a pleural effusion or pneumothorax. Musculoskeletal: A chronic versus congenital appearing linear area is seen involving the body of the sternum. Numerous chronic right-sided rib fractures are seen. A chronic compression fracture deformity is seen involving the T9 vertebral body. Review of the MIP images confirms the above findings. CTA ABDOMEN AND PELVIS FINDINGS VASCULAR Aorta: Marked severity calcification, without aneurysm, dissection, vasculitis or significant stenosis. Celiac: Patent without  evidence of aneurysm, dissection, vasculitis or significant stenosis. SMA: Patent without evidence of aneurysm, dissection, vasculitis or significant stenosis. Renals: Both renal arteries are patent without evidence of aneurysm, dissection, vasculitis, fibromuscular dysplasia or significant stenosis. IMA: Patent without evidence of aneurysm, dissection, vasculitis or significant stenosis. Inflow: Marked severity calcification without evidence of aneurysm, dissection, vasculitis or significant stenosis. Veins: No obvious venous abnormality within the limitations of this arterial phase study. Review of the MIP images confirms the above findings. NON-VASCULAR Hepatobiliary: There is mild diffuse fatty infiltration of the liver parenchyma. No focal liver abnormality is seen. The gallbladder is moderately distended without evidence of gallstones, gallbladder wall thickening, or biliary dilatation. Pancreas: Unremarkable. No pancreatic ductal dilatation or surrounding inflammatory changes. Spleen: Normal in size without focal abnormality. Adrenals/Urinary Tract: There is mild diffuse bilateral adrenal gland enlargement. Kidneys are normal, without renal calculi, focal lesion, or hydronephrosis. A moderate amount of heterogeneous, mildly increased attenuation is seen within the dependent portion of the urinary bladder lumen. Stomach/Bowel: There is a small to moderate sized hiatal hernia. Surgical sutures are seen within the adjacent portion of  the gastric region. The appendix is surgically absent. Surgically anastomosed bowel is seen within the anterior aspect of the mid to lower abdomen. No evidence of bowel dilatation. Numerous diverticula are seen throughout the sigmoid colon. Mild thickening of the mid sigmoid colon is seen. Lymphatic: No abnormal abdominal or pelvic lymph nodes are identified. Reproductive: Status post hysterectomy. No adnexal masses. Other: No abdominal wall hernia or abnormality. No abdominopelvic  ascites. Musculoskeletal: Chronic fracture deformities are seen involving the right superior and right inferior pubic rami. Bilateral metallic density pedicle screws are seen at the levels of L5 and S1. Approximately 1.1 cm anterolisthesis of the L5 vertebral body is seen on S1. Review of the MIP images confirms the above findings. IMPRESSION: 1. No evidence of aortic dissection or aneurysmal dilatation. 2. Marked severity coronary artery calcification. 3. Fatty liver. 4. Small to moderate sized hiatal hernia. 5. Sigmoid diverticulosis with mild thickening of the mid sigmoid colon which may represent mild acute diverticulitis. 6. Postoperative changes within the abdomen and pelvis. 7. Chronic fracture deformities involving the right superior and right inferior pubic rami. 8. 1.1 cm anterolisthesis of the L5 vertebral body on S1. 9. Aortic atherosclerosis. Aortic Atherosclerosis (ICD10-I70.0). Electronically Signed   By: Aram Candela M.D.   On: 01/04/2020 22:55    Micro Results    Recent Results (from the past 240 hour(s))  Urine Culture     Status: Abnormal   Collection Time: 01/29/20  8:32 PM   Specimen: Urine, Random  Result Value Ref Range Status   Specimen Description   Final    URINE, RANDOM Performed at [facility], [address] [place], [place] [address]    Special Requests   Final    NONE Performed at [facility], [address] [place], [place] [address]    Culture MULTIPLE SPECIES PRESENT, SUGGEST RECOLLECTION (A)  Final   Report Status 01/31/2020 FINAL  Final  Respiratory Panel by RT PCR (Flu A&B, Covid) - Nasopharyngeal Swab     Status: None   Collection Time: 01/30/20  4:23 AM   Specimen: Nasopharyngeal Swab  Result Value Ref Range Status   SARS Coronavirus 2 by RT PCR NEGATIVE NEGATIVE Final    Comment: (NOTE) SARS-CoV-2 target nucleic acids are NOT DETECTED.  The SARS-CoV-2 RNA is generally detectable in upper respiratoy specimens during the acute phase of  infection. The lowest concentration of SARS-CoV-2 viral copies this assay can detect is 131 copies/mL. A negative result does not preclude SARS-Cov-2 infection and should not be used as the sole basis for treatment or other patient management decisions. A negative result may occur with  improper specimen collection/handling, submission of specimen other than nasopharyngeal swab, presence of viral mutation(s) within the areas targeted by this assay, and inadequate number of viral copies (<131 copies/mL). A negative result must be combined with clinical observations, patient history, and epidemiological information. The expected result is Negative.  Fact Sheet for Patients:  https://www.moore.com/  Fact Sheet for Healthcare Providers:  https://www.young.biz/  This test is no t yet approved or cleared by the [place] FDA and  has been authorized for detection and/or diagnosis of SARS-CoV-2 by FDA under an Emergency Use Authorization (EUA). This EUA will remain  in effect (meaning this test can be used) for the duration of the COVID-19 declaration under Section 564(b)(1) of the Act, 21 U.S.C. section 360bbb-3(b)(1), unless the authorization is terminated or revoked sooner.     Influenza A by PCR NEGATIVE NEGATIVE Final   Influenza  B by PCR NEGATIVE NEGATIVE Final    Comment: (NOTE) The Xpert Xpress SARS-CoV-2/FLU/RSV assay is intended as an aid in  the diagnosis of influenza from Nasopharyngeal swab specimens and  should not be used as a sole basis for treatment. Nasal washings and  aspirates are unacceptable for Xpert Xpress SARS-CoV-2/FLU/RSV  testing.  Fact Sheet for Patients: https://www.moore.com/  Fact Sheet for Healthcare Providers: https://www.young.biz/  This test is not yet approved or cleared by the [place] FDA and  has been authorized for detection and/or diagnosis of SARS-CoV-2  by  FDA under an Emergency Use Authorization (EUA). This EUA will remain  in effect (meaning this test can be used) for the duration of the  Covid-19 declaration under Section 564(b)(1) of the Act, 21  U.S.C. section 360bbb-3(b)(1), unless the authorization is  terminated or revoked. Performed at [facility], [address] [place], [place] [address]    Today   Subjective    Brenda Hopkins today has no new complaints  No fever  Or chills   No Nausea, Vomiting or Diarrhea -  Patient has been seen and examined prior to discharge   Objective   Blood pressure (!) 141/84, pulse 82, temperature 97.6 F (36.4 C), temperature source Oral, resp. rate 19, height 5\' 1"  (1.549 m), weight 79.4 kg, SpO2 96 %.   Intake/Output Summary (Last 24 hours) at 01/31/2020 1524 Last data filed at 01/31/2020 1522 Gross per 24 hour  Intake 724.23 ml  Output 400 ml  Net 324.23 ml   Exam Gen:- Awake Alert, no acute distress  HEENT:- [place].AT, No sclera icterus Neck-Supple Neck,No JVD,.  Lungs-  CTAB , good air movement bilaterally  CV- S1, S2 normal, regular Abd-  +ve B.Sounds, Abd Soft, No tenderness,    Extremity/Skin:- No  edema,   good pulses Psych-affect is appropriate, oriented x3 Neuro-Generalized weakness, no new focal deficits, no tremors    Data Review   CBC w Diff:  Lab Results  Component Value Date   WBC 6.5 01/31/2020   HGB 11.1 (L) 01/31/2020   HCT 37.0 01/31/2020   PLT 496 (H) 01/31/2020   LYMPHOPCT 8 01/15/2020   MONOPCT 7 01/15/2020   EOSPCT 0 01/15/2020   BASOPCT 0 01/15/2020   CMP:  Lab Results  Component Value Date   NA 133 (L) 01/31/2020   K 3.4 (L) 01/31/2020   CL 95 (L) 01/31/2020   CO2 28 01/31/2020   BUN 23 01/31/2020   CREATININE 0.91 01/31/2020   CREATININE 0.82 05/13/2014   PROT 6.3 (L) 01/30/2020   ALBUMIN 2.7 (L) 01/30/2020   BILITOT 0.7 01/30/2020   ALKPHOS 87 01/30/2020   AST 8 (L) 01/30/2020   ALT 8 01/30/2020   Total Discharge time is  about 33 minutes  Shon Hale M.D on 01/31/2020 at 3:24 PM  Go to [email or web address] -  for contact info  [facility] - Office  [phone]

## 2020-01-31 NOTE — Evaluation (Signed)
Physical Therapy Evaluation Patient Details Name: Brenda Hopkins MRN: [id number] DOB: 13-Feb-1941 Today's Date: 01/31/2020   History of Present Illness  Brenda Hopkins is a 79 y.o. female with medical history significant for hypertension, anxiety, marginal ulcer, tobacco abuse and chronic pain who presents to the ED due to constant sharp and occasional dull pain with radiation to the chest that has been ongoing since being discharged from the hospital (admitted from 11/3-11/7) due to intractable epigastric abdominal pain, pain was rated as 10/10 on pain scale with no known alleviating/aggravating factor.  Patient states that she has had decreased oral intake due to the abdominal pain and associated nausea.  During the hospitalization, EGD was done and patient was noted to have a marginal ulcer, she was discharged home on PPI/Carafate and instructed to follow-up with PCP to wean medication and also to follow-up with GI for repeat EGD.  Apparently, patient has not been taking the Protonix since discharge from the hospital.  Patient has not been taking NSAIDs since discharge per daughter at bedside.  She denies, fever, chills, headache, chest pain, diarrhea or constipation.    Clinical Impression  Patient limited for functional mobility as stated below secondary to BLE weakness, fatigue and impaired standing balance. Patient seated EOB at beginning of session. Patient transfers to standing with use of RW and does not require physical assist. Patient ambulates in room with slow, labored cadence and requires standing rest break after 5 feet secondary to fatigue and impaired activity tolerance. Patient ambulates back to bed with fatigue noted following. Patient stated she does not really feel very safe going back home due to continued fatigue with activity. Patient educated on use of RW to reduce the risk of falling. Patient ended session seated EOB. Patient will benefit from continued physical therapy  in hospital and recommended venue below to increase strength, balance, endurance for safe ADLs and gait.     Follow Up Recommendations SNF;Supervision - Intermittent;Supervision for mobility/OOB    Equipment Recommendations  None recommended by PT    Recommendations for Other Services       Precautions / Restrictions Precautions Precautions: Fall Restrictions Weight Bearing Restrictions: No      Mobility  Bed Mobility               General bed mobility comments: Patient seated EOB at beginning of session    Transfers Overall transfer level: Needs assistance Equipment used: Rolling walker (2 wheeled) Transfers: Sit to/from [employer] Sit to Stand: Min guard Stand pivot transfers: Min guard       General transfer comment: Patient transfers to standing with RW, min g for safety/balance  Ambulation/Gait Ambulation/Gait assistance: Min guard Gait Distance (Feet): 10 Feet Assistive device: Rolling walker (2 wheeled) Gait Pattern/deviations: Step-through pattern;Decreased step length - right;Decreased step length - left;Decreased stride length Gait velocity: decreased   General Gait Details: slow, labored cadence with RW, fatigued after 5 feet requiring standing rest break before returning to bed  Stairs            Wheelchair Mobility    Modified Rankin (Stroke Patients Only)       Balance Overall balance assessment: Needs assistance Sitting-balance support: Feet supported;No upper extremity supported Sitting balance-Leahy Scale: Normal Sitting balance - Comments: seated EOB   Standing balance support: Bilateral upper extremity supported Standing balance-Leahy Scale: Fair Standing balance comment: fair/good with RW  Pertinent Vitals/Pain Pain Assessment: 0-10 Pain Score: 8  Pain Location: abdomen Pain Descriptors / Indicators: Aching Pain Intervention(s): Limited activity within patient's  tolerance;Monitored during session;Repositioned    Home Living Family/patient expects to be discharged to:: Private residence Living Arrangements: Alone Available Help at Discharge: Family;Available PRN/intermittently Type of Home: House Home Access: Level entry     Home Layout: One level Home Equipment: Walker - 2 wheels;Cane - single point;Shower seat;Bedside commode;Wheelchair - manual      Prior Function Level of Independence: Needs assistance   Gait / Transfers Assistance Needed: Patient states household ambulator without AD but occasionally uses RW or SPC  ADL's / Homemaking Assistance Needed: Independent with basic ADL, daughter assists with groceries        Hand Dominance        Extremity/Trunk Assessment   Upper Extremity Assessment Upper Extremity Assessment: Generalized weakness    Lower Extremity Assessment Lower Extremity Assessment: Generalized weakness    Cervical / Trunk Assessment Cervical / Trunk Assessment: Normal  Communication   Communication: No difficulties  Cognition Arousal/Alertness: Awake/alert Behavior During Therapy: WFL for tasks assessed/performed Overall Cognitive Status: Within Functional Limits for tasks assessed                                        General Comments      Exercises     Assessment/Plan    PT Assessment Patient needs continued PT services  PT Problem List Decreased strength;Decreased mobility;Decreased activity tolerance;Pain       PT Treatment Interventions DME instruction;Therapeutic exercise;Gait training;Balance training;Stair training;Functional mobility training;Neuromuscular re-education;Patient/family education;Therapeutic activities    PT Goals (Current goals can be found in the Care Plan section)  Acute Rehab PT Goals Patient Stated Goal: return home PT Goal Formulation: With patient Time For Goal Achievement: 02/14/20 Potential to Achieve Goals: Fair    Frequency Min  3X/week   Barriers to discharge        Co-evaluation               AM-PAC PT "6 Clicks" Mobility  Outcome Measure Help needed turning from your back to your side while in a flat bed without using bedrails?: None Help needed moving from lying on your back to sitting on the side of a flat bed without using bedrails?: A Little Help needed moving to and from a bed to a chair (including a wheelchair)?: A Little Help needed standing up from a chair using your arms (e.g., wheelchair or bedside chair)?: A Little Help needed to walk in hospital room?: A Little Help needed climbing 3-5 steps with a railing? : A Lot 6 Click Score: 18    End of Session Equipment Utilized During Treatment: Gait belt Activity Tolerance: Patient limited by fatigue;Patient tolerated treatment well Patient left: in bed;with call bell/phone within reach Nurse Communication: Mobility status PT Visit Diagnosis: Unsteadiness on feet (R26.81);Other abnormalities of gait and mobility (R26.89);Muscle weakness (generalized) (M62.81)    Time: 0277-4128 PT Time Calculation (min) (ACUTE ONLY): 13 min   Charges:   PT Evaluation $PT Eval Moderate Complexity: 1 Mod         8:57 AM, 01/31/20 Wyman Songster PT, DPT Physical Therapist at [facility]

## 2020-04-07 DIAGNOSIS — G894 Chronic pain syndrome: Secondary | ICD-10-CM | POA: Diagnosis not present

## 2020-05-04 DIAGNOSIS — M5136 Other intervertebral disc degeneration, lumbar region: Secondary | ICD-10-CM | POA: Diagnosis not present

## 2020-05-04 DIAGNOSIS — G894 Chronic pain syndrome: Secondary | ICD-10-CM | POA: Diagnosis not present

## 2020-05-04 DIAGNOSIS — M1991 Primary osteoarthritis, unspecified site: Secondary | ICD-10-CM | POA: Diagnosis not present

## 2020-05-04 DIAGNOSIS — M5416 Radiculopathy, lumbar region: Secondary | ICD-10-CM | POA: Diagnosis not present

## 2020-06-04 DIAGNOSIS — G4709 Other insomnia: Secondary | ICD-10-CM | POA: Diagnosis not present

## 2020-06-04 DIAGNOSIS — G894 Chronic pain syndrome: Secondary | ICD-10-CM | POA: Diagnosis not present

## 2020-06-04 DIAGNOSIS — I499 Cardiac arrhythmia, unspecified: Secondary | ICD-10-CM | POA: Diagnosis not present

## 2020-06-04 DIAGNOSIS — Z681 Body mass index (BMI) 19 or less, adult: Secondary | ICD-10-CM | POA: Diagnosis not present

## 2020-06-12 ENCOUNTER — Emergency Department (HOSPITAL_COMMUNITY): Payer: Medicare Other

## 2020-06-12 ENCOUNTER — Inpatient Hospital Stay (HOSPITAL_COMMUNITY)
Admission: EM | Admit: 2020-06-12 | Discharge: 2020-06-24 | DRG: 291 | Disposition: A | Discharge status: Home Health | Payer: Medicare Other | Attending: Internal Medicine | Admitting: Internal Medicine

## 2020-06-12 ENCOUNTER — Encounter (HOSPITAL_COMMUNITY): Payer: Self-pay | Admitting: *Deleted

## 2020-06-12 ENCOUNTER — Other Ambulatory Visit: Payer: Self-pay

## 2020-06-12 DIAGNOSIS — K219 Gastro-esophageal reflux disease without esophagitis: Secondary | ICD-10-CM | POA: Diagnosis present

## 2020-06-12 DIAGNOSIS — J9601 Acute respiratory failure with hypoxia: Secondary | ICD-10-CM | POA: Diagnosis present

## 2020-06-12 DIAGNOSIS — J181 Lobar pneumonia, unspecified organism: Secondary | ICD-10-CM | POA: Diagnosis not present

## 2020-06-12 DIAGNOSIS — Z8719 Personal history of other diseases of the digestive system: Secondary | ICD-10-CM | POA: Diagnosis not present

## 2020-06-12 DIAGNOSIS — K6289 Other specified diseases of anus and rectum: Secondary | ICD-10-CM

## 2020-06-12 DIAGNOSIS — E875 Hyperkalemia: Secondary | ICD-10-CM | POA: Diagnosis not present

## 2020-06-12 DIAGNOSIS — R600 Localized edema: Secondary | ICD-10-CM | POA: Diagnosis not present

## 2020-06-12 DIAGNOSIS — K289 Gastrojejunal ulcer, unspecified as acute or chronic, without hemorrhage or perforation: Secondary | ICD-10-CM | POA: Diagnosis present

## 2020-06-12 DIAGNOSIS — R0602 Shortness of breath: Secondary | ICD-10-CM

## 2020-06-12 DIAGNOSIS — A419 Sepsis, unspecified organism: Secondary | ICD-10-CM | POA: Diagnosis not present

## 2020-06-12 DIAGNOSIS — J440 Chronic obstructive pulmonary disease with acute lower respiratory infection: Secondary | ICD-10-CM | POA: Diagnosis not present

## 2020-06-12 DIAGNOSIS — K3189 Other diseases of stomach and duodenum: Secondary | ICD-10-CM | POA: Diagnosis not present

## 2020-06-12 DIAGNOSIS — R6510 Systemic inflammatory response syndrome (SIRS) of non-infectious origin without acute organ dysfunction: Secondary | ICD-10-CM | POA: Diagnosis not present

## 2020-06-12 DIAGNOSIS — Z66 Do not resuscitate: Secondary | ICD-10-CM | POA: Diagnosis present

## 2020-06-12 DIAGNOSIS — K449 Diaphragmatic hernia without obstruction or gangrene: Secondary | ICD-10-CM | POA: Diagnosis present

## 2020-06-12 DIAGNOSIS — R918 Other nonspecific abnormal finding of lung field: Secondary | ICD-10-CM | POA: Diagnosis not present

## 2020-06-12 DIAGNOSIS — R109 Unspecified abdominal pain: Secondary | ICD-10-CM

## 2020-06-12 DIAGNOSIS — D649 Anemia, unspecified: Secondary | ICD-10-CM | POA: Diagnosis not present

## 2020-06-12 DIAGNOSIS — G894 Chronic pain syndrome: Secondary | ICD-10-CM | POA: Diagnosis not present

## 2020-06-12 DIAGNOSIS — N179 Acute kidney failure, unspecified: Secondary | ICD-10-CM | POA: Diagnosis present

## 2020-06-12 DIAGNOSIS — Z452 Encounter for adjustment and management of vascular access device: Secondary | ICD-10-CM | POA: Diagnosis not present

## 2020-06-12 DIAGNOSIS — R112 Nausea with vomiting, unspecified: Secondary | ICD-10-CM | POA: Diagnosis not present

## 2020-06-12 DIAGNOSIS — Q396 Congenital diverticulum of esophagus: Secondary | ICD-10-CM | POA: Diagnosis not present

## 2020-06-12 DIAGNOSIS — I700 Atherosclerosis of aorta: Secondary | ICD-10-CM | POA: Diagnosis present

## 2020-06-12 DIAGNOSIS — I2510 Atherosclerotic heart disease of native coronary artery without angina pectoris: Secondary | ICD-10-CM | POA: Diagnosis present

## 2020-06-12 DIAGNOSIS — R1010 Upper abdominal pain, unspecified: Secondary | ICD-10-CM | POA: Diagnosis not present

## 2020-06-12 DIAGNOSIS — K921 Melena: Secondary | ICD-10-CM

## 2020-06-12 DIAGNOSIS — Z515 Encounter for palliative care: Secondary | ICD-10-CM | POA: Diagnosis not present

## 2020-06-12 DIAGNOSIS — G9341 Metabolic encephalopathy: Secondary | ICD-10-CM

## 2020-06-12 DIAGNOSIS — K439 Ventral hernia without obstruction or gangrene: Secondary | ICD-10-CM | POA: Diagnosis present

## 2020-06-12 DIAGNOSIS — I10 Essential (primary) hypertension: Secondary | ICD-10-CM | POA: Diagnosis not present

## 2020-06-12 DIAGNOSIS — R339 Retention of urine, unspecified: Secondary | ICD-10-CM | POA: Diagnosis present

## 2020-06-12 DIAGNOSIS — I517 Cardiomegaly: Secondary | ICD-10-CM | POA: Diagnosis not present

## 2020-06-12 DIAGNOSIS — I509 Heart failure, unspecified: Secondary | ICD-10-CM

## 2020-06-12 DIAGNOSIS — S2243XA Multiple fractures of ribs, bilateral, initial encounter for closed fracture: Secondary | ICD-10-CM | POA: Diagnosis not present

## 2020-06-12 DIAGNOSIS — R569 Unspecified convulsions: Secondary | ICD-10-CM | POA: Diagnosis not present

## 2020-06-12 DIAGNOSIS — S22070A Wedge compression fracture of T9-T10 vertebra, initial encounter for closed fracture: Secondary | ICD-10-CM | POA: Diagnosis not present

## 2020-06-12 DIAGNOSIS — I110 Hypertensive heart disease with heart failure: Secondary | ICD-10-CM | POA: Diagnosis not present

## 2020-06-12 DIAGNOSIS — I5032 Chronic diastolic (congestive) heart failure: Secondary | ICD-10-CM | POA: Diagnosis present

## 2020-06-12 DIAGNOSIS — Z980 Intestinal bypass and anastomosis status: Secondary | ICD-10-CM | POA: Diagnosis not present

## 2020-06-12 DIAGNOSIS — F17290 Nicotine dependence, other tobacco product, uncomplicated: Secondary | ICD-10-CM | POA: Diagnosis present

## 2020-06-12 DIAGNOSIS — D509 Iron deficiency anemia, unspecified: Secondary | ICD-10-CM | POA: Diagnosis not present

## 2020-06-12 DIAGNOSIS — Z79899 Other long term (current) drug therapy: Secondary | ICD-10-CM

## 2020-06-12 DIAGNOSIS — I5033 Acute on chronic diastolic (congestive) heart failure: Secondary | ICD-10-CM | POA: Diagnosis present

## 2020-06-12 DIAGNOSIS — E785 Hyperlipidemia, unspecified: Secondary | ICD-10-CM | POA: Diagnosis not present

## 2020-06-12 DIAGNOSIS — R1013 Epigastric pain: Secondary | ICD-10-CM | POA: Diagnosis not present

## 2020-06-12 DIAGNOSIS — R0902 Hypoxemia: Secondary | ICD-10-CM | POA: Diagnosis not present

## 2020-06-12 DIAGNOSIS — K9189 Other postprocedural complications and disorders of digestive system: Secondary | ICD-10-CM | POA: Diagnosis not present

## 2020-06-12 DIAGNOSIS — Z7189 Other specified counseling: Secondary | ICD-10-CM | POA: Diagnosis not present

## 2020-06-12 DIAGNOSIS — R0600 Dyspnea, unspecified: Secondary | ICD-10-CM

## 2020-06-12 DIAGNOSIS — Z20822 Contact with and (suspected) exposure to covid-19: Secondary | ICD-10-CM | POA: Diagnosis present

## 2020-06-12 DIAGNOSIS — R5381 Other malaise: Secondary | ICD-10-CM | POA: Diagnosis not present

## 2020-06-12 DIAGNOSIS — E782 Mixed hyperlipidemia: Secondary | ICD-10-CM

## 2020-06-12 DIAGNOSIS — R6521 Severe sepsis with septic shock: Secondary | ICD-10-CM | POA: Diagnosis not present

## 2020-06-12 DIAGNOSIS — Z781 Physical restraint status: Secondary | ICD-10-CM

## 2020-06-12 DIAGNOSIS — A0472 Enterocolitis due to Clostridium difficile, not specified as recurrent: Secondary | ICD-10-CM | POA: Diagnosis not present

## 2020-06-12 DIAGNOSIS — Z79891 Long term (current) use of opiate analgesic: Secondary | ICD-10-CM

## 2020-06-12 DIAGNOSIS — I472 Ventricular tachycardia: Secondary | ICD-10-CM | POA: Diagnosis not present

## 2020-06-12 DIAGNOSIS — J9811 Atelectasis: Secondary | ICD-10-CM | POA: Diagnosis present

## 2020-06-12 DIAGNOSIS — E869 Volume depletion, unspecified: Secondary | ICD-10-CM | POA: Diagnosis not present

## 2020-06-12 DIAGNOSIS — I5031 Acute diastolic (congestive) heart failure: Secondary | ICD-10-CM | POA: Diagnosis not present

## 2020-06-12 DIAGNOSIS — Z9884 Bariatric surgery status: Secondary | ICD-10-CM

## 2020-06-12 DIAGNOSIS — Z888 Allergy status to other drugs, medicaments and biological substances status: Secondary | ICD-10-CM

## 2020-06-12 DIAGNOSIS — I4891 Unspecified atrial fibrillation: Secondary | ICD-10-CM | POA: Diagnosis not present

## 2020-06-12 DIAGNOSIS — R079 Chest pain, unspecified: Secondary | ICD-10-CM | POA: Diagnosis not present

## 2020-06-12 DIAGNOSIS — K2289 Other specified disease of esophagus: Secondary | ICD-10-CM | POA: Diagnosis not present

## 2020-06-12 LAB — CBC WITH DIFFERENTIAL/PLATELET
Abs Immature Granulocytes: 0.04 10*3/uL (ref 0.00–0.07)
Basophils Absolute: 0 10*3/uL (ref 0.0–0.1)
Basophils Relative: 1 %
Eosinophils Absolute: 0.1 10*3/uL (ref 0.0–0.5)
Eosinophils Relative: 1 %
HCT: 31.8 % — ABNORMAL LOW (ref 36.0–46.0)
Hemoglobin: 9.1 g/dL — ABNORMAL LOW (ref 12.0–15.0)
Immature Granulocytes: 1 %
Lymphocytes Relative: 10 %
Lymphs Abs: 0.9 10*3/uL (ref 0.7–4.0)
MCH: 26.4 pg (ref 26.0–34.0)
MCHC: 28.6 g/dL — ABNORMAL LOW (ref 30.0–36.0)
MCV: 92.2 fL (ref 80.0–100.0)
Monocytes Absolute: 0.6 10*3/uL (ref 0.1–1.0)
Monocytes Relative: 8 %
Neutro Abs: 6.9 10*3/uL (ref 1.7–7.7)
Neutrophils Relative %: 79 %
Platelets: 366 10*3/uL (ref 150–400)
RBC: 3.45 MIL/uL — ABNORMAL LOW (ref 3.87–5.11)
RDW: 17.8 % — ABNORMAL HIGH (ref 11.5–15.5)
WBC: 8.5 10*3/uL (ref 4.0–10.5)
nRBC: 0 % (ref 0.0–0.2)

## 2020-06-12 LAB — COMPREHENSIVE METABOLIC PANEL
ALT: 24 U/L (ref 0–44)
AST: 31 U/L (ref 15–41)
Albumin: 3.1 g/dL — ABNORMAL LOW (ref 3.5–5.0)
Alkaline Phosphatase: 109 U/L (ref 38–126)
Anion gap: 9 (ref 5–15)
BUN: 27 mg/dL — ABNORMAL HIGH (ref 8–23)
CO2: 25 mmol/L (ref 22–32)
Calcium: 8.2 mg/dL — ABNORMAL LOW (ref 8.9–10.3)
Chloride: 103 mmol/L (ref 98–111)
Creatinine, Ser: 0.89 mg/dL (ref 0.44–1.00)
GFR, Estimated: >60 mL/min (ref >60)
Glucose, Bld: 135 mg/dL — ABNORMAL HIGH (ref 70–99)
Potassium: 3.7 mmol/L (ref 3.5–5.1)
Sodium: 137 mmol/L (ref 135–145)
Total Bilirubin: 0.6 mg/dL (ref 0.3–1.2)
Total Protein: 6.5 g/dL (ref 6.5–8.1)

## 2020-06-12 LAB — TSH: TSH: 1.242 u[IU]/mL (ref 0.350–4.500)

## 2020-06-12 LAB — LACTIC ACID, PLASMA
Lactic Acid, Venous: 1 mmol/L (ref 0.5–1.9)
Lactic Acid, Venous: 1.2 mmol/L (ref 0.5–1.9)

## 2020-06-12 LAB — BRAIN NATRIURETIC PEPTIDE: B Natriuretic Peptide: 1270 pg/mL — ABNORMAL HIGH (ref 0.0–100.0)

## 2020-06-12 LAB — RESP PANEL BY RT-PCR (FLU A&B, COVID) ARPGX2
Influenza A by PCR: NEGATIVE
Influenza B by PCR: NEGATIVE
SARS Coronavirus 2 by RT PCR: NEGATIVE

## 2020-06-12 LAB — TROPONIN I (HIGH SENSITIVITY)
Troponin I (High Sensitivity): 71 ng/L — ABNORMAL HIGH (ref <18)
Troponin I (High Sensitivity): 63 ng/L — ABNORMAL HIGH (ref <18)

## 2020-06-12 MED ORDER — FUROSEMIDE 10 MG/ML IJ SOLN
40.0000 mg | Freq: Two times a day (BID) | INTRAMUSCULAR | Status: DC
  Administered 2020-06-13 – 2020-06-16 (×7): 40 mg via INTRAVENOUS
  Filled 2020-06-12 (×7): qty 4

## 2020-06-12 MED ORDER — ATORVASTATIN CALCIUM 20 MG PO TABS
20.0000 mg | ORAL_TABLET | Freq: Every day | ORAL | Status: DC
  Administered 2020-06-12 – 2020-06-14 (×3): 20 mg via ORAL
  Filled 2020-06-12 (×2): qty 1
  Filled 2020-06-12: qty 2

## 2020-06-12 MED ORDER — ACETAMINOPHEN 325 MG PO TABS
650.0000 mg | ORAL_TABLET | ORAL | Status: DC | PRN
  Administered 2020-06-13 – 2020-06-16 (×4): 650 mg via ORAL
  Filled 2020-06-12 (×4): qty 2

## 2020-06-12 MED ORDER — SODIUM CHLORIDE 0.9% FLUSH
3.0000 mL | INTRAVENOUS | Status: DC | PRN
Start: 2020-06-12 — End: 2020-06-24
  Administered 2020-06-23: 3 mL via INTRAVENOUS

## 2020-06-12 MED ORDER — OXYCODONE HCL 5 MG PO TABS
15.0000 mg | ORAL_TABLET | Freq: Four times a day (QID) | ORAL | Status: DC
  Administered 2020-06-12 – 2020-06-14 (×7): 15 mg via ORAL
  Filled 2020-06-12 (×7): qty 3

## 2020-06-12 MED ORDER — SODIUM CHLORIDE 0.9 % IV SOLN
500.0000 mg | Freq: Once | INTRAVENOUS | Status: AC
  Administered 2020-06-12: 500 mg via INTRAVENOUS
  Filled 2020-06-12: qty 500

## 2020-06-12 MED ORDER — ONDANSETRON HCL 4 MG/2ML IJ SOLN
4.0000 mg | Freq: Four times a day (QID) | INTRAMUSCULAR | Status: DC | PRN
  Administered 2020-06-14 – 2020-06-23 (×3): 4 mg via INTRAVENOUS
  Filled 2020-06-12 (×3): qty 2

## 2020-06-12 MED ORDER — GABAPENTIN 300 MG PO CAPS
600.0000 mg | ORAL_CAPSULE | Freq: Three times a day (TID) | ORAL | Status: DC
  Administered 2020-06-12 – 2020-06-17 (×14): 600 mg via ORAL
  Filled 2020-06-12 (×14): qty 2

## 2020-06-12 MED ORDER — FUROSEMIDE 10 MG/ML IJ SOLN
40.0000 mg | Freq: Once | INTRAMUSCULAR | Status: AC
  Administered 2020-06-12: 40 mg via INTRAVENOUS
  Filled 2020-06-12: qty 4

## 2020-06-12 MED ORDER — ROPINIROLE HCL 1 MG PO TABS
3.0000 mg | ORAL_TABLET | Freq: Every day | ORAL | Status: DC
  Administered 2020-06-13 – 2020-06-17 (×6): 3 mg via ORAL
  Filled 2020-06-12 (×7): qty 3

## 2020-06-12 MED ORDER — ENOXAPARIN SODIUM 40 MG/0.4ML ~~LOC~~ SOLN
40.0000 mg | SUBCUTANEOUS | Status: DC
  Administered 2020-06-12 – 2020-06-16 (×5): 40 mg via SUBCUTANEOUS
  Filled 2020-06-12 (×5): qty 0.4

## 2020-06-12 MED ORDER — QUETIAPINE FUMARATE 100 MG PO TABS
200.0000 mg | ORAL_TABLET | Freq: Every day | ORAL | Status: DC
  Administered 2020-06-12 – 2020-06-17 (×6): 200 mg via ORAL
  Filled 2020-06-12 (×6): qty 2

## 2020-06-12 MED ORDER — POTASSIUM CHLORIDE CRYS ER 20 MEQ PO TBCR
20.0000 meq | EXTENDED_RELEASE_TABLET | Freq: Two times a day (BID) | ORAL | Status: DC
  Administered 2020-06-12 – 2020-06-14 (×5): 20 meq via ORAL
  Filled 2020-06-12 (×6): qty 1

## 2020-06-12 MED ORDER — SODIUM CHLORIDE 0.9 % IV SOLN
1.0000 g | Freq: Once | INTRAVENOUS | Status: AC
  Administered 2020-06-12: 1 g via INTRAVENOUS
  Filled 2020-06-12: qty 10

## 2020-06-12 MED ORDER — LISINOPRIL 10 MG PO TABS
10.0000 mg | ORAL_TABLET | Freq: Every day | ORAL | Status: DC
  Administered 2020-06-13 – 2020-06-15 (×3): 10 mg via ORAL
  Filled 2020-06-12 (×3): qty 1

## 2020-06-12 MED ORDER — SODIUM CHLORIDE 0.9 % IV SOLN
250.0000 mL | INTRAVENOUS | Status: DC | PRN
Start: 2020-06-12 — End: 2020-06-24

## 2020-06-12 MED ORDER — AMLODIPINE BESYLATE 5 MG PO TABS
10.0000 mg | ORAL_TABLET | Freq: Every day | ORAL | Status: DC
  Administered 2020-06-13 – 2020-06-15 (×3): 10 mg via ORAL
  Filled 2020-06-12 (×3): qty 2

## 2020-06-12 MED ORDER — IOHEXOL 350 MG/ML SOLN
75.0000 mL | Freq: Once | INTRAVENOUS | Status: AC | PRN
  Administered 2020-06-12: 75 mL via INTRAVENOUS

## 2020-06-12 MED ORDER — PANTOPRAZOLE SODIUM 40 MG PO TBEC
40.0000 mg | DELAYED_RELEASE_TABLET | Freq: Two times a day (BID) | ORAL | Status: DC
  Administered 2020-06-12 – 2020-06-17 (×9): 40 mg via ORAL
  Filled 2020-06-12 (×9): qty 1

## 2020-06-12 MED ORDER — SODIUM CHLORIDE 0.9% FLUSH
3.0000 mL | Freq: Two times a day (BID) | INTRAVENOUS | Status: DC
  Administered 2020-06-13 – 2020-06-24 (×20): 3 mL via INTRAVENOUS

## 2020-06-12 NOTE — ED Provider Notes (Signed)
[facility] EMERGENCY DEPARTMENT Provider Note   CSN: [id number] Arrival date & time: 06/12/20  1346     History Chief Complaint  Patient presents with  . Shortness of Breath    Brenda Hopkins is a 80 y.o. female presenting for evaluation of shortness of breath and chest pain.  Patient states she has had increasing shortness of breath over the past week.  She reports worsening shortness of breath with exertion and when laying flat.  She states she has also had mild chest pain for the same amount of time.  It does not radiate.  She denies fevers, chills, cough, nausea, vomiting, abdominal pain, urinary symptoms, normal bowel movements.  She has been noted to have leg swelling which is new for her.  She is not on Lasix.  She is not on blood thinners.  She states she saw her PCP recently who noted an abnormal heart rhythm and referred her to cardiology, but she has not gone yet.  She has never needed a cardiologist before.  She reports a history of COPD.  No history of diabetes she is not vaccinated for COVID.  She denies sick contacts.  HPI     Past Medical History:  Diagnosis Date  . Chronic back pain   . GERD (gastroesophageal reflux disease)   . Hiatal hernia   . Hyperlipidemia   . Hypertension   . Pelvic fracture (HCC) 12/12/2012  . Smoker     Patient Active Problem List   Diagnosis Date Noted  . Gi Tract---Marginal ulcer 01/30/2020  . Noncompliance with medication regimen 01/30/2020  . AKI (acute kidney injury) (HCC) 01/30/2020  . Dehydration 01/30/2020  . Hypoalbuminemia 01/30/2020  . Obesity (BMI 30.0-34.9) 01/30/2020  . Nausea 01/30/2020  . Acute respiratory failure with hypoxia (HCC) 01/30/2020  . Epigastric pain 01/16/2020  . Ventral hernia without obstruction or gangrene   . Intractable epigastric abdominal pain 01/15/2020  . Insomnia 05/13/2014  . Generalized anxiety disorder 07/15/2013  . Hypertension   . Chronic back pain   . Hyperlipidemia   . GERD  (gastroesophageal reflux disease)   . Hiatal hernia   . Smoker   . Pelvic fracture (HCC) 12/12/2012    Past Surgical History:  Procedure Laterality Date  . ABDOMINAL HYSTERECTOMY    . APPENDECTOMY    . ESOPHAGOGASTRODUODENOSCOPY (EGD) WITH PROPOFOL N/A 01/17/2020   Procedure: ESOPHAGOGASTRODUODENOSCOPY (EGD) WITH PROPOFOL;  Surgeon: Dolores Frame, MD;  Location: [facility];  Service: Gastroenterology;  Laterality: N/A;  . 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 HEMORROIDECTOMY    . LUMBAR SPINE SURGERY    . OOPHORECTOMY    . TONSILLECTOMY       OB History   No obstetric history on file.     No family history on file.  Social History   Tobacco Use  . Smoking status: Former Smoker    Packs/day: 0.50    Types: Cigarettes  . Smokeless tobacco: Former Neurosurgeon    Quit date: 12/28/2012  Vaping Use  . Vaping Use: Every day  Substance Use Topics  . Alcohol use: No  . Drug use: No    Home Medications Prior to Admission medications   Medication Sig Start Date End Date Taking? Authorizing Provider  amLODipine (NORVASC) 10 MG tablet TAKE 1 TABLET BY MOUTH EVERY DAY 09/28/16   Allayne Butcher B, PA-C  atorvastatin (LIPITOR) 20 MG tablet TAKE 1 TABLET BY MOUTH EVERY NIGHT AT BEDTIME 07/11/16   Dorena Bodo, PA-C  gabapentin (NEURONTIN) 600 MG tablet TAKE 1 TABLET BY MOUTH THREE TIMES DAILY 07/13/15   Allayne Butcher B, PA-C  lisinopril (ZESTRIL) 10 MG tablet Take 10 mg by mouth daily. 12/04/19   [provider]  ondansetron (ZOFRAN) 4 MG tablet Take 1 tablet (4 mg total) by mouth every 6 (six) hours. 01/31/20   Shon Hale, MD  oxyCODONE (ROXICODONE) 15 MG immediate release tablet Take 15 mg by mouth 4 (four) times daily.    [provider]  pantoprazole (PROTONIX) 40 MG tablet Take 1 tablet (40 mg total) by mouth 2 (two) times daily. 01/31/20 04/30/20  Shon Hale, MD  potassium chloride SA (KLOR-CON) 20 MEQ tablet Take 20 mEq by mouth 2 (two) times daily. 01/02/20    [provider]  QUEtiapine (SEROQUEL) 200 MG tablet Take 200 mg by mouth at bedtime. 10/22/19   [provider]  rOPINIRole (REQUIP) 3 MG tablet Take 3 mg by mouth at bedtime. 10/23/19   [provider]  sucralfate (CARAFATE) 1 g tablet Take 1 tablet (1 g total) by mouth 2 (two) times daily. 01/31/20 03/01/20  Shon Hale, MD    Allergies    Prozac [fluoxetine hcl], Zoloft [sertraline hcl], and Nicoderm [nicotine]  Review of Systems   Review of Systems  Respiratory: Positive for shortness of breath.   Cardiovascular: Positive for chest pain and leg swelling.  All other systems reviewed and are negative.   Physical Exam Updated Vital Signs BP (!) 160/97   Pulse 85   Temp 98.2 F (36.8 C)   Resp 19   SpO2 100%   Physical Exam Vitals and nursing note reviewed.  Constitutional:      General: She is not in acute distress.    Appearance: She is well-developed.     Comments: Appears chronically ill, increased work of breathing  HENT:     Head: Normocephalic and atraumatic.  Eyes:     Extraocular Movements: Extraocular movements intact.     Conjunctiva/sclera: Conjunctivae normal.     Pupils: Pupils are equal, round, and reactive to light.  Cardiovascular:     Rate and Rhythm: Regular rhythm. Tachycardia present.     Pulses: Normal pulses.     Comments: Tachycardic around 115 on my evaluation Pulmonary:     Effort: Tachypnea and accessory muscle usage present.     Breath sounds: Normal breath sounds. No wheezing.     Comments: Tachypneic with increased accessory muscle use.  No obvious abnormal lung sounds.  Hypoxic in the mid 80s on room air, improved to mid 90s with 2 L of oxygen. Abdominal:     General: There is no distension.     Palpations: Abdomen is soft. There is no mass.     Tenderness: There is no abdominal tenderness. There is no guarding or rebound.  Musculoskeletal:        General: Normal range of motion.     Cervical back:  Normal range of motion and neck supple.     Right lower leg: Edema present.     Left lower leg: Edema present.     Comments: 2-3+ pitting edema bilaterally.  Left leg greater than right.  Skin:    General: Skin is warm and dry.     Capillary Refill: Capillary refill takes less than 2 seconds.  Neurological:     Mental Status: She is alert and oriented to person, place, and time.     ED Results / Procedures / Treatments   Labs (  all labs ordered are listed, but only abnormal results are displayed) Labs Reviewed  CBC WITH DIFFERENTIAL/PLATELET - Abnormal; Notable for the following components:      Result Value   RBC 3.45 (*)    Hemoglobin 9.1 (*)    HCT 31.8 (*)    MCHC 28.6 (*)    RDW 17.8 (*)    All other components within normal limits  COMPREHENSIVE METABOLIC PANEL - Abnormal; Notable for the following components:   Glucose, Bld 135 (*)    BUN 27 (*)    Calcium 8.2 (*)    Albumin 3.1 (*)    All other components within normal limits  BRAIN NATRIURETIC PEPTIDE - Abnormal; Notable for the following components:   B Natriuretic Peptide 1,270.0 (*)    All other components within normal limits  TROPONIN I (HIGH SENSITIVITY) - Abnormal; Notable for the following components:   Troponin I (High Sensitivity) 71 (*)    All other components within normal limits  TROPONIN I (HIGH SENSITIVITY) - Abnormal; Notable for the following components:   Troponin I (High Sensitivity) 63 (*)    All other components within normal limits  RESP PANEL BY RT-PCR (FLU A&B, COVID) ARPGX2  CULTURE, BLOOD (ROUTINE X 2)  CULTURE, BLOOD (ROUTINE X 2)  LACTIC ACID, PLASMA  LACTIC ACID, PLASMA    EKG EKG Interpretation  Date/Time:  Friday June 12 2020 14:03:40 EDT Ventricular Rate:  101 PR Interval:  162 QRS Duration: 78 QT Interval:  375 QTC Calculation: 487 R Axis:   -11 Text Interpretation: Sinus tachycardia Atrial premature complexes Anterior infarct, age indeterminate No significant change  since last tracing Confirmed by Jacalyn LefevreHaviland, Julie [phone]([id number]) on 06/12/2020 2:09:17 PM   Radiology DG Chest 1 View  Result Date: 06/12/2020 CLINICAL DATA:  Shortness of breath EXAM: CHEST  1 VIEW COMPARISON:  January 15, 2020 FINDINGS: There is ill-defined airspace opacity in the right upper lobe. There is scarring in the left base. The heart is enlarged with pulmonary vascularity normal, stable. There is aortic atherosclerosis. Bones are osteoporotic. There is extensive arthropathy in the right shoulder. IMPRESSION: Ill-defined airspace opacity consistent with pneumonia right upper lobe. Scarring left base. Stable cardiac enlargement. Bones osteoporotic. Aortic Atherosclerosis (ICD10-I70.0). Electronically Signed   By: Bretta BangWilliam  Woodruff III M.D.   On: 06/12/2020 14:47   CT Angio Chest PE W and/or Wo Contrast  Result Date: 06/12/2020 CLINICAL DATA:  80 year old female with shortness of breath. EXAM: CT ANGIOGRAPHY CHEST WITH CONTRAST TECHNIQUE: Multidetector CT imaging of the chest was performed using the standard protocol during bolus administration of intravenous contrast. Multiplanar CT image reconstructions and MIPs were obtained to evaluate the vascular anatomy. CONTRAST:  Seventy-five mL Omnipaque 350, intravenous COMPARISON:  01/04/2020 FINDINGS: Cardiovascular: Satisfactory opacification of the pulmonary arteries to the segmental level. No evidence of pulmonary embolism. Similar appearing moderate global cardiomegaly. Mitral annular calcifications. Severe coronary atherosclerotic calcifications. Scattered atherosclerotic calcifications of the thoracic aorta. No pericardial effusion. Mediastinum/Nodes: No enlarged mediastinal, hilar, or axillary lymph nodes. The esophagus is patulous throughout with a standing column of enteric contents. Thyroid gland and trachea demonstrate no significant findings. Lungs/Pleura: Bibasilar and lingular subsegmental atelectasis. Mild upper lobe predominant centrilobular  emphysema. No suspicious pulmonary nodules. No pleural effusion or pneumothorax. 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 The visualized upper abdomen is otherwise within normal limits. Musculoskeletal: Progressive compression deformity of the T9 vertebral body with associated kyphosis at this level, now nearly vertebra plana morphology. Mild, unchanged retropulsion. Similar appearing congenital  sternal abnormality versus nonunion horizontally oriented sternal fracture about the mid sternal body. Multilevel chronic, bilateral posterolateral nondisplaced rib fractures. Advanced degenerative changes of the glenohumeral joints bilaterally. No acute osseous abnormality. Review of the MIP images confirms the above findings. IMPRESSION: Vascular: 1. No evidence of pulmonary embolism. 2. Unchanged moderate global cardiomegaly. 3. Severe coronary atherosclerotic calcifications. 4.  Aortic Atherosclerosis (ICD10-I70.0). Non-Vascular: 1. Bibasilar and lingular subsegmental atelectasis. 2. Progressive compression deformity of the T9 vertebral body, now essentially vertebra plana. Unchanged mild retropulsion. Marliss Coots, MD [facility] Radiology Electronically Signed   By: Marliss Coots MD   On: 06/12/2020 16:45   US Venous Img Lower  Left (DVT Study)  Result Date: 06/12/2020 CLINICAL DATA:  Lower extremity pain and edema EXAM: LEFT LOWER EXTREMITY VENOUS DUPLEX ULTRASOUND TECHNIQUE: Gray-scale sonography with graded compression, as well as color Doppler and duplex ultrasound were performed to evaluate the left lower extremity deep venous system from the level of the common femoral vein and including the common femoral, femoral, profunda femoral, popliteal and calf veins including the posterior tibial, peroneal and gastrocnemius veins when visible. The superficial great saphenous vein was also interrogated. Spectral Doppler was utilized to evaluate flow at  rest and with distal augmentation maneuvers in the common femoral, femoral and popliteal veins. COMPARISON:  None. FINDINGS: Contralateral Common Femoral Vein: Respiratory phasicity is normal and symmetric with the symptomatic side. No evidence of thrombus. Normal compressibility. Common Femoral Vein: No evidence of thrombus. Normal compressibility, respiratory phasicity and response to augmentation. Saphenofemoral Junction: No evidence of thrombus. Normal compressibility and flow on color Doppler imaging. Profunda Femoral Vein: No evidence of thrombus. Normal compressibility and flow on color Doppler imaging. Femoral Vein: No evidence of thrombus. Normal compressibility, respiratory phasicity and response to augmentation. Popliteal Vein: No evidence of thrombus. Normal compressibility, respiratory phasicity and response to augmentation. Calf Veins: No evidence of thrombus. Normal compressibility and flow on color Doppler imaging. Superficial Great Saphenous Vein: No evidence of thrombus. Normal compressibility. Venous Reflux:  None. Other Findings:  Soft tissue edema noted in the calf region. IMPRESSION: No evidence of deep venous thrombosis in the left lower extremity. Right common femoral vein patent. There is left calf region soft tissue edema. Electronically Signed   By: Bretta Bang III M.D.   On: 06/12/2020 14:46    Procedures Procedures   Medications Ordered in ED Medications  azithromycin (ZITHROMAX) 500 mg in sodium chloride 0.9 % 250 mL IVPB (has no administration in time range)  cefTRIAXone (ROCEPHIN) 1 g in sodium chloride 0.9 % 100 mL IVPB (1 g Intravenous New Bag/Given 06/12/20 1537)  furosemide (LASIX) injection 40 mg (40 mg Intravenous Given 06/12/20 1537)  iohexol (OMNIPAQUE) 350 MG/ML injection 75 mL (75 mLs Intravenous Contrast Given 06/12/20 1554)    ED Course  I have reviewed the triage vital signs and the nursing notes.  Pertinent labs & imaging results that were available  during my care of the patient were reviewed by me and considered in my medical decision making (see chart for details).    MDM Rules/Calculators/A&P                          Patient presented for evaluation of chest pain and shortness of breath.  On exam, patient has increased work of breathing.  In the setting of orthopnea, dyspnea on exertion, leg swelling, concern for acute onset heart failure.  Will give Lasix.  Patient  initially hypoxic on room air, placed on 2 L and sats improved to mid 90s.  Due to work of breathing, BiPAP was ordered.  Also consider ACS as patient is chest pain, obtain EKG and troponin.  Consider infection, though less likely as patient is afebrile and without cough.  Consider PE, especially as left leg is more swollen than right.  DVT ultrasound ordered.  Labs interpreted by me, shows elevated BNP consistent with heart failure.  Mildly elevated troponin, likely secondary to demand.  No leukocytosis indicate infection.  Chest x-ray viewed interpreted by me, no obvious heart failure.  Per radiology, there does appear to be a right upper lobe pneumonia.  Antibiotics started.  However chest x-ray does not obviously explain patient's hypoxia and shortness of breath on arrival.  As such, obtain CTA to rule out PE.  On reevaluation after Lasix, oxygenation, and antibiotics, patient has improved work of breathing.  She states her shortness of breath is much better, she still has chest discomfort.  Patient's daughter is at bedside, states patient was found to be in A. fib last week at her PCPs office, this is why she was referred to cardiology but has not yet gone.  Unclear whether patient is on a blood thinner at this time.  CTA shows no sign of PE.  Will call for admission.  Discussed with Dr. Seth Bake from [facility] hospitalist service, patient to be admitted.  Final Clinical Impression(s) / ED Diagnoses Final diagnoses:  Acute heart failure, unspecified heart failure type [facility])   Hypoxia    Rx / DC Orders ED Discharge Orders    None       Alveria Apley, PA-C 06/12/20 1726    Jacalyn Lefevre, MD 06/15/20 1655

## 2020-06-12 NOTE — ED Triage Notes (Signed)
Shortness of breath for the past week, worse today

## 2020-06-12 NOTE — ED Notes (Signed)
Updated pt on POC for admission and that she will be given nighttime medications for sleep on the floor. Pt medicated for pain "all over." denies dyspnea at this time.

## 2020-06-12 NOTE — ED Notes (Signed)
Pt repositioned for comfort in bed. Provided ice chips. Daughter remains at bedside. Remains on monitor. No additional needs voiced at this time.

## 2020-06-12 NOTE — H&P (Signed)
[facility] H&P   Patient Demographics:    Brenda Hopkins, is a 80 y.o. female  MRN: [id number]   DOB - [birth year]  Admit Date - 06/12/2020  Outpatient Primary MD for the patient is Samuella Bruin  Referring MD/NP/PA: PA sophia    Patient coming from: home  Chief Complaint  Patient presents with  . Shortness of Breath      HPI:    Brenda Hopkins  is a 80 y.o. female, with medical history significant forhypertension, anxiety, marginalulcer, tobacco abuseandchronic pain due to complaints of shortness of breath, reports symptoms going on for last week, reports intense with exertion, and laying flat, as well she does report some epigastric/lower chest pain, does not radiate, as well she does report worsening lower extremity edema, denies fever, chills, cough, nausea or vomiting, she denies any dysuria or polyuria, reports she was recently seen by her PCP for lower extremity edema which she was started on Lasix for last 4 days, report was told to have A. fib, and referral has been made to cardiologist but she did not get to see them yet, as well she does report history of COPD, but she denies any cough, productive sputum or wheezing. -Was significant for BNP of 1270, chest x-ray significant for vascular congestion, EKG nonacute, troponins non-ACS pattern 71>63, was given IV Lasix, and [facility] hospitalist consulted to admit for CHF.    Review of systems:    In addition to the HPI above, No Fever-chills, No Headache, No changes with Vision or hearing, No problems swallowing food or Liquids, She reports chest pain, shortness of breath, she denies cough. No Abdominal pain, No Nausea or Vommitting, Bowel movements are regular, No Blood in stool or Urine, No dysuria, No new skin rashes or bruises, No new joints pains-aches,  No new weakness, tingling, numbness in any extremity, No  recent weight gain or loss, No polyuria, polydypsia or polyphagia, No significant Mental Stressors.  A full 10 point Review of Systems was done, except as stated above, all other Review of Systems were negative.   With Past History of the following :    Past Medical History:  Diagnosis Date  . Chronic back pain   . GERD (gastroesophageal reflux disease)   . Hiatal hernia   . Hyperlipidemia   . Hypertension   . Pelvic fracture (HCC) 12/12/2012  . Smoker       Past Surgical History:  Procedure Laterality Date  . ABDOMINAL HYSTERECTOMY    . APPENDECTOMY    . ESOPHAGOGASTRODUODENOSCOPY (EGD) WITH PROPOFOL N/A 01/17/2020   Procedure: ESOPHAGOGASTRODUODENOSCOPY (EGD) WITH PROPOFOL;  Surgeon: Dolores Frame, MD;  Location: [facility];  Service: Gastroenterology;  Laterality: N/A;  . 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 HEMORROIDECTOMY    . LUMBAR SPINE SURGERY    . OOPHORECTOMY    . TONSILLECTOMY  Social History:     Social History   Tobacco Use  . Smoking status: Former Smoker    Packs/day: 0.50    Types: Cigarettes  . Smokeless tobacco: Former Neurosurgeon    Quit date: 12/28/2012  Substance Use Topics  . Alcohol use: No       Family History :   Family history was reviewed, nonpertinent  Home Medications:   Prior to Admission medications   Medication Sig Start Date End Date Taking? Authorizing Provider  amLODipine (NORVASC) 10 MG tablet TAKE 1 TABLET BY MOUTH EVERY DAY 09/28/16   Allayne Butcher B, PA-C  atorvastatin (LIPITOR) 20 MG tablet TAKE 1 TABLET BY MOUTH EVERY NIGHT AT BEDTIME 07/11/16   Dixon, Mary B, PA-C  gabapentin (NEURONTIN) 600 MG tablet TAKE 1 TABLET BY MOUTH THREE TIMES DAILY 07/13/15   Allayne Butcher B, PA-C  lisinopril (ZESTRIL) 10 MG tablet Take 10 mg by mouth daily. 12/04/19   [provider]  ondansetron (ZOFRAN) 4 MG tablet Take 1 tablet (4 mg total) by mouth every 6 (six) hours. 01/31/20   Shon Hale, MD  oxyCODONE (ROXICODONE) 15  MG immediate release tablet Take 15 mg by mouth 4 (four) times daily.    [provider]  pantoprazole (PROTONIX) 40 MG tablet Take 1 tablet (40 mg total) by mouth 2 (two) times daily. 01/31/20 04/30/20  Shon Hale, MD  potassium chloride SA (KLOR-CON) 20 MEQ tablet Take 20 mEq by mouth 2 (two) times daily. 01/02/20   [provider]  QUEtiapine (SEROQUEL) 200 MG tablet Take 200 mg by mouth at bedtime. 10/22/19   [provider]  rOPINIRole (REQUIP) 3 MG tablet Take 3 mg by mouth at bedtime. 10/23/19   [provider]  sucralfate (CARAFATE) 1 g tablet Take 1 tablet (1 g total) by mouth 2 (two) times daily. 01/31/20 03/01/20  Shon Hale, MD     Allergies:     Allergies  Allergen Reactions  . Prozac [Fluoxetine Hcl] Other (See Comments)    Makes her fell crazy  . Zoloft [Sertraline Hcl]     Made her feel crazy--10/2013  . Nicoderm [Nicotine] Rash    Site rash     Physical Exam:   Vitals  Blood pressure (!) 160/97, pulse 85, temperature 98.2 F (36.8 C), resp. rate 19, SpO2 100 %.   1. General elderly female, laying in bed, no apparent distress  2. Normal affect and insight, Not Suicidal or Homicidal, Awake Alert, Oriented X 3.  3. No F.N deficits, ALL C.Nerves Intact, Strength 5/5 all 4 extremities, Sensation intact all 4 extremities, Plantars down going.  4. Ears and Eyes appear Normal, Conjunctivae clear, PERRLA. Moist Oral Mucosa.  5. Supple Neck, No JVD, No cervical lymphadenopathy appriciated, No Carotid Bruits.  6. Symmetrical Chest wall movement, Good air movement bilaterally, bibasilar crackles, +2 edema bilaterally  7. RRR, No Gallops, Rubs or Murmurs, No Parasternal Heave.  8. Positive Bowel Sounds, Abdomen Soft, No tenderness, No organomegaly appriciated,No rebound -guarding or rigidity.  9.  No Cyanosis, Normal Skin Turgor, No Skin Rash or Bruise.  10. Good muscle tone,  joints appear normal , no effusions, Normal  ROM.  11. No Palpable Lymph Nodes in Neck or Axillae    Data Review:    CBC Recent Labs  Lab 06/12/20 1413  WBC 8.5  HGB 9.1*  HCT 31.8*  PLT 366  MCV 92.2  MCH 26.4  MCHC 28.6*  RDW 17.8*  LYMPHSABS 0.9  MONOABS 0.6  EOSABS 0.1  BASOSABS 0.0   ------------------------------------------------------------------------------------------------------------------  Chemistries  Recent Labs  Lab 06/12/20 1413  NA 137  K 3.7  CL 103  CO2 25  GLUCOSE 135*  BUN 27*  CREATININE 0.89  CALCIUM 8.2*  AST 31  ALT 24  ALKPHOS 109  BILITOT 0.6   ------------------------------------------------------------------------------------------------------------------ CrCl cannot be calculated (Unknown ideal weight.). ------------------------------------------------------------------------------------------------------------------ No results for input(s): TSH, T4TOTAL, T3FREE, THYROIDAB in the last 72 hours.  Invalid input(s): FREET3  Coagulation profile No results for input(s): INR, PROTIME in the last 168 hours. ------------------------------------------------------------------------------------------------------------------- No results for input(s): DDIMER in the last 72 hours. -------------------------------------------------------------------------------------------------------------------  Cardiac Enzymes No results for input(s): CKMB, TROPONINI, MYOGLOBIN in the last 168 hours.  Invalid input(s): CK ------------------------------------------------------------------------------------------------------------------    Component Value Date/Time   BNP 1,270.0 (H) 06/12/2020 1413     ---------------------------------------------------------------------------------------------------------------  Urinalysis    Component Value Date/Time   COLORURINE AMBER (A) 01/29/2020 2032   APPEARANCEUR CLOUDY (A) 01/29/2020 2032   LABSPEC 1.025 01/29/2020 2032   PHURINE 5.0 01/29/2020  2032   GLUCOSEU NEGATIVE 01/29/2020 2032   HGBUR NEGATIVE 01/29/2020 2032   BILIRUBINUR SMALL (A) 01/29/2020 2032   KETONESUR NEGATIVE 01/29/2020 2032   PROTEINUR 30 (A) 01/29/2020 2032   NITRITE NEGATIVE 01/29/2020 2032   LEUKOCYTESUR TRACE (A) 01/29/2020 2032    ----------------------------------------------------------------------------------------------------------------   Imaging Results:    DG Chest 1 View  Result Date: 06/12/2020 CLINICAL DATA:  Shortness of breath EXAM: CHEST  1 VIEW COMPARISON:  January 15, 2020 FINDINGS: There is ill-defined airspace opacity in the right upper lobe. There is scarring in the left base. The heart is enlarged with pulmonary vascularity normal, stable. There is aortic atherosclerosis. Bones are osteoporotic. There is extensive arthropathy in the right shoulder. IMPRESSION: Ill-defined airspace opacity consistent with pneumonia right upper lobe. Scarring left base. Stable cardiac enlargement. Bones osteoporotic. Aortic Atherosclerosis (ICD10-I70.0). Electronically Signed   By: Bretta BangWilliam  Woodruff III M.D.   On: 06/12/2020 14:47   CT Angio Chest PE W and/or Wo Contrast  Result Date: 06/12/2020 CLINICAL DATA:  80 year old female with shortness of breath. EXAM: CT ANGIOGRAPHY CHEST WITH CONTRAST TECHNIQUE: Multidetector CT imaging of the chest was performed using the standard protocol during bolus administration of intravenous contrast. Multiplanar CT image reconstructions and MIPs were obtained to evaluate the vascular anatomy. CONTRAST:  Seventy-five mL Omnipaque 350, intravenous COMPARISON:  01/04/2020 FINDINGS: Cardiovascular: Satisfactory opacification of the pulmonary arteries to the segmental level. No evidence of pulmonary embolism. Similar appearing moderate global cardiomegaly. Mitral annular calcifications. Severe coronary atherosclerotic calcifications. Scattered atherosclerotic calcifications of the thoracic aorta. No pericardial effusion.  Mediastinum/Nodes: No enlarged mediastinal, hilar, or axillary lymph nodes. The esophagus is patulous throughout with a standing column of enteric contents. Thyroid gland and trachea demonstrate no significant findings. Lungs/Pleura: Bibasilar and lingular subsegmental atelectasis. Mild upper lobe predominant centrilobular emphysema. No suspicious pulmonary nodules. No pleural effusion or pneumothorax. 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 The visualized upper abdomen is otherwise within normal limits. Musculoskeletal: Progressive compression deformity of the T9 vertebral body with associated kyphosis at this level, now nearly vertebra plana morphology. Mild, unchanged retropulsion. 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 Multilevel chronic, bilateral posterolateral nondisplaced rib fractures. Advanced degenerative changes of the glenohumeral joints bilaterally. No acute osseous abnormality. Review of the MIP images confirms the above findings. IMPRESSION: Vascular: 1. No evidence of pulmonary embolism. 2. Unchanged moderate global cardiomegaly. 3. Severe coronary atherosclerotic calcifications.  4.  Aortic Atherosclerosis (ICD10-I70.0). Non-Vascular: 1. Bibasilar and lingular subsegmental atelectasis. 2. Progressive compression deformity of the T9 vertebral body, now essentially vertebra plana. Unchanged mild retropulsion. Marliss Coots, MD [facility] Electronically Signed   By: Marliss Coots MD   On: 06/12/2020 16:45   US Venous Img Lower  Left (DVT Study)  Result Date: 06/12/2020 CLINICAL DATA:  Lower extremity pain and edema EXAM: LEFT LOWER EXTREMITY VENOUS DUPLEX ULTRASOUND TECHNIQUE: Gray-scale sonography with graded compression, as well as color Doppler and duplex ultrasound were performed to evaluate the left lower extremity deep venous  system from the level of the common femoral vein and including the common femoral, femoral, profunda femoral, popliteal and calf veins including the posterior tibial, peroneal and gastrocnemius veins when visible. The superficial great saphenous vein was also interrogated. Spectral Doppler was utilized to evaluate flow at rest and with distal augmentation maneuvers in the common femoral, femoral and popliteal veins. COMPARISON:  None. FINDINGS: Contralateral Common Femoral Vein: Respiratory phasicity is normal and symmetric with the symptomatic side. No evidence of thrombus. Normal compressibility. Common Femoral Vein: No evidence of thrombus. Normal compressibility, respiratory phasicity and response to augmentation. Saphenofemoral Junction: No evidence of thrombus. Normal compressibility and flow on color Doppler imaging. Profunda Femoral Vein: No evidence of thrombus. Normal compressibility and flow on color Doppler imaging. Femoral Vein: No evidence of thrombus. Normal compressibility, respiratory phasicity and response to augmentation. Popliteal Vein: No evidence of thrombus. Normal compressibility, respiratory phasicity and response to augmentation. Calf Veins: No evidence of thrombus. Normal compressibility and flow on color Doppler imaging. Superficial Great Saphenous Vein: No evidence of thrombus. Normal compressibility. Venous Reflux:  None. Other Findings:  Soft tissue edema noted in the calf region. IMPRESSION: No evidence of deep venous thrombosis in the left lower extremity. Right common femoral vein patent. There is left calf region soft tissue edema. Electronically Signed   By: Bretta Bang III M.D.   On: 06/12/2020 14:46    My personal review of EKG: Rhythm NSR, Rate  101 /min, QTc 487 , no Acute ST changes   Assessment & Plan:    Active Problems:   Hypertension   Hyperlipidemia   GERD (gastroesophageal reflux disease)   Acute respiratory failure with hypoxia (HCC)   Acute on  chronic diastolic CHF (congestive heart failure) (HCC)  Acute on chronic diastolic CHF -It was presents with elevated BNP, vascular congestion on imaging, worsening lower extremity edema. -She is admitted under CHF pathway, she will be started on IV Lasix, daily weights, strict ins and outs. -Given sudden onset of volume overload symptoms, I will repeat her 2D echo.  Patient with atrial fibrillation -Report patient was diagnosed with A. fib recent PCP office, currently she is in sinus rhythm, I have tried to call office to confirm that, but unfortunately no one is available. -She will be monitored closely on telemetry, if any A. fib develops then she will be started on full anticoagulation. -Check TSH.  Chest pain -Typical, she had some reproducible epigastric pain on palpation, EKG nonacute, ACS ruled out, troponin 71> 63.   COPD -No active wheezing, no with home medications.  Hyperlipidemia -Continue with statin  Hypertension -Continue with Norvasc and lisinopril  Chronic pain syndrome -Continue with home medications  DVT Prophylaxis  Lovenox  AM Labs Ordered, also please review Full Orders  Family Communication: Admission, patients condition and plan of care including tests being ordered have been discussed with the patient and  daughter at bedside who indicate understanding and agree with the plan and Code Status.  Code Status Full  Likely DC to  home  Condition GUARDED    Consults called: none    Admission status: inpatient    Time spent in minutes : 60 minutes   Huey Bienenstock M.D on 06/12/2020 at 5:32 PM   [facility] - Office  [phone]

## 2020-06-12 NOTE — ED Notes (Signed)
Pt sitting up eating meal tray. Call bell in reach. Bed low and locked, remains connected to monitors.

## 2020-06-12 NOTE — ED Notes (Signed)
Pt sitting up in bed doing word puzzles. No additional needs voiced at this time. Updated her on POC for admission room. She expressed understanding. Bed low and locked, call bell in reach. Daughter has gone home.

## 2020-06-12 NOTE — ED Notes (Signed)
Pt asked if she could have her home dose of oxycodone as she normally would have taken a dose at this time of day. Explained admitting MD did not order this medication but would be contacted to determine if he will place an order. At this time pt resting in bed, RR even and nonlabored. Speaking in full sentences. Updated on POC for admission. Bed low and locked, call bell in reach. Daughter will return to see pt.

## 2020-06-13 ENCOUNTER — Inpatient Hospital Stay (HOSPITAL_COMMUNITY): Payer: Medicare Other

## 2020-06-13 DIAGNOSIS — I5033 Acute on chronic diastolic (congestive) heart failure: Secondary | ICD-10-CM | POA: Diagnosis not present

## 2020-06-13 DIAGNOSIS — I10 Essential (primary) hypertension: Secondary | ICD-10-CM | POA: Diagnosis not present

## 2020-06-13 DIAGNOSIS — I5031 Acute diastolic (congestive) heart failure: Secondary | ICD-10-CM | POA: Diagnosis not present

## 2020-06-13 DIAGNOSIS — J9601 Acute respiratory failure with hypoxia: Secondary | ICD-10-CM | POA: Diagnosis not present

## 2020-06-13 DIAGNOSIS — E782 Mixed hyperlipidemia: Secondary | ICD-10-CM | POA: Diagnosis not present

## 2020-06-13 DIAGNOSIS — K219 Gastro-esophageal reflux disease without esophagitis: Secondary | ICD-10-CM

## 2020-06-13 LAB — BASIC METABOLIC PANEL
Anion gap: 7 (ref 5–15)
BUN: 23 mg/dL (ref 8–23)
CO2: 29 mmol/L (ref 22–32)
Calcium: 8 mg/dL — ABNORMAL LOW (ref 8.9–10.3)
Chloride: 106 mmol/L (ref 98–111)
Creatinine, Ser: 0.75 mg/dL (ref 0.44–1.00)
GFR, Estimated: >60 mL/min (ref >60)
Glucose, Bld: 85 mg/dL (ref 70–99)
Potassium: 4.1 mmol/L (ref 3.5–5.1)
Sodium: 142 mmol/L (ref 135–145)

## 2020-06-13 LAB — CBC WITH DIFFERENTIAL/PLATELET
Abs Immature Granulocytes: 0.01 10*3/uL (ref 0.00–0.07)
Basophils Absolute: 0 10*3/uL (ref 0.0–0.1)
Basophils Relative: 1 %
Eosinophils Absolute: 0.2 10*3/uL (ref 0.0–0.5)
Eosinophils Relative: 4 %
HCT: 28 % — ABNORMAL LOW (ref 36.0–46.0)
Hemoglobin: 8 g/dL — ABNORMAL LOW (ref 12.0–15.0)
Immature Granulocytes: 0 %
Lymphocytes Relative: 25 %
Lymphs Abs: 1.3 10*3/uL (ref 0.7–4.0)
MCH: 26.3 pg (ref 26.0–34.0)
MCHC: 28.6 g/dL — ABNORMAL LOW (ref 30.0–36.0)
MCV: 92.1 fL (ref 80.0–100.0)
Monocytes Absolute: 0.6 10*3/uL (ref 0.1–1.0)
Monocytes Relative: 12 %
Neutro Abs: 2.9 10*3/uL (ref 1.7–7.7)
Neutrophils Relative %: 58 %
Platelets: 284 10*3/uL (ref 150–400)
RBC: 3.04 MIL/uL — ABNORMAL LOW (ref 3.87–5.11)
RDW: 17.8 % — ABNORMAL HIGH (ref 11.5–15.5)
WBC: 5 10*3/uL (ref 4.0–10.5)
nRBC: 0 % (ref 0.0–0.2)

## 2020-06-13 LAB — ECHOCARDIOGRAM COMPLETE
Area-P 1/2: 3.36 cm2
Height: 64 in
S' Lateral: 2.99 cm
Weight: 2511.48 oz

## 2020-06-13 LAB — TROPONIN I (HIGH SENSITIVITY)
Troponin I (High Sensitivity): 28 ng/L — ABNORMAL HIGH (ref <18)
Troponin I (High Sensitivity): 29 ng/L — ABNORMAL HIGH (ref <18)
Troponin I (High Sensitivity): 43 ng/L — ABNORMAL HIGH (ref <18)

## 2020-06-13 LAB — BRAIN NATRIURETIC PEPTIDE: B Natriuretic Peptide: 1152 pg/mL — ABNORMAL HIGH (ref 0.0–100.0)

## 2020-06-13 MED ORDER — ALUM & MAG HYDROXIDE-SIMETH 200-200-20 MG/5ML PO SUSP
30.0000 mL | ORAL | Status: DC | PRN
  Administered 2020-06-13 (×2): 30 mL via ORAL
  Filled 2020-06-13 (×2): qty 30

## 2020-06-13 MED ORDER — NITROGLYCERIN 0.4 MG SL SUBL
0.4000 mg | SUBLINGUAL_TABLET | SUBLINGUAL | Status: DC | PRN
  Administered 2020-06-13: 0.4 mg via SUBLINGUAL
  Filled 2020-06-13: qty 1

## 2020-06-13 MED ORDER — SUCRALFATE 1 GM/10ML PO SUSP
1.0000 g | Freq: Three times a day (TID) | ORAL | Status: DC
  Administered 2020-06-13 – 2020-06-24 (×20): 1 g via ORAL
  Filled 2020-06-13 (×20): qty 10

## 2020-06-13 MED ORDER — SUCRALFATE 1 GM/10ML PO SUSP
1.0000 g | Freq: Three times a day (TID) | ORAL | Status: DC
  Filled 2020-06-13: qty 10

## 2020-06-13 NOTE — Progress Notes (Signed)
*  PRELIMINARY RESULTS* Echocardiogram 2D Echocardiogram has been performed.  Brenda Hopkins 06/13/2020, 2:56 PM

## 2020-06-13 NOTE — Progress Notes (Signed)
PROGRESS NOTE    Patient: Brenda Hopkins                            PCP: Samuella BruinMann, Benjamin L, PA-C                    DOB: 03/25/1940            DOA: 06/12/2020 [id number]             DOS: 06/13/2020, 6:55 AM   LOS: 1 day   Date of Service: The patient was seen and examined on 06/13/2020  Subjective:   The patient was seen and examined this morning. Stable at this time.... Sleepy--but arousable Still complaining of SOB but no chest pain or Palpitation Otherwise no issues overnight .  Brief Narrative:   Brenda Hopkins  is a 80 y.o. female, with medical history significant forhypertension, anxiety, marginalulcer, tobacco abuseandchronic pain due to complaints of shortness of breath, reports symptoms going on for last week, reports intense with exertion, and laying flat, as well she does report some epigastric/lower chest pain, does not radiate, as well she does report worsening lower extremity edema, denies fever, chills, cough, nausea or vomiting,.  she denies any dysuria or polyuria, reports she was recently seen by her PCP for lower extremity edema which she was started on Lasix for last 4 days, report was told to have A. fib, and referral has been made to cardiologist but she did not get to see them yet, as well she does report history of COPD, but she denies any cough, productive sputum or wheezing.  -Was significant for BNP of 1270, chest x-ray significant for vascular congestion, EKG nonacute, troponins non-ACS pattern 71>63, was given IV Lasix, and [facility] consulted to admit for CHF.   Assessment & Plan:   Active Problems:   Hypertension   Hyperlipidemia   GERD (gastroesophageal reflux disease)   Acute respiratory failure with hypoxia (HCC)   Acute on chronic diastolic CHF (congestive heart failure) (HCC)   Acute on chronic diastolic CHF -Currently stable, no significant changes lower extremity edema, shortness of breath improved -BNP 1270 >>> -Troponin 71,  63, 43  -elevated BNP, vascular congestion on imaging, worsening lower extremity edema. -Continue Lasix 40 mg daily weight, I's and O's -Repeating 2D echocardiogram >>  -Urology consult for further input -She is admitted under CHF pathway, she will be started on IV Lasix, daily weights, strict ins and outs. -Given sudden onset of volume overload symptoms, will repeat 2D echo  -CTA: Reviewed-pulmonary embolism, cardiomegaly, severe coronary atherosclerosis, aortic atherosclerosis,.. Bilateral sublingual atelectasis, T9 vertebral compression deformity   ?Atrial fibrillation -Possible new onset -?  Paroxysmal -Reported by PCP, current EKG normal sinus rhythm -TSH normal at 1.24.   Chest pain -No chest pain this morning -Typical, she had some reproducible epigastric pain on palpation,  - EKG nonacute, ACS ruled out, troponin 71> 63 >> 43, -As needed nitroglycerin aspirin oxygen    COPD -No active wheezing, no with home medications.  Hyperlipidemia -Continue with statin  Hypertension -Continue with Norvasc and lisinopril  Chronic pain syndrome -Continue with home medications     ---------------------------------------------------------------------------------------------------------------------------------------- Nutritional status:  The patient's BMI is: Body mass index is 26.94 kg/m. I agree with the assessment and plan as outlined ---------------------------------------------------------------------------------------------------------------------------------------- Cultures; Blood cultures x2  Antimicrobials: None   Consultants: Cardiology  ---------------------------------------------------------------------------------------------------------------------------------------- DVT prophylaxis:  SCD/Compression stockings and Lovenox SQ Code Status:  Code Status: Full Code  Family Communication: No family member present at bedside- attempt will be made to  update daily The above findings and plan of care has been discussed with patient (and family)  in detail,  they expressed understanding and agreement of above. -Advance care planning has been discussed.   Admission status:   Status is: Inpatient  Remains inpatient appropriate because:Hemodynamically unstable and Inpatient level of care appropriate due to severity of illness   Dispo: The patient is from: Home              Anticipated d/c is to: Home              Patient currently is not medically stable to d/c.   Difficult to place patient No      Level of care: Telemetry   Procedures:   No admission procedures for hospital encounter.    Antimicrobials:  Anti-infectives (From admission, onward)   Start     Dose/Rate Route Frequency Ordered Stop   06/12/20 1500  cefTRIAXone (ROCEPHIN) 1 g in sodium chloride 0.9 % 100 mL IVPB        1 g 200 mL/hr over 30 Minutes Intravenous  Once 06/12/20 1454 06/12/20 1756   06/12/20 1500  azithromycin (ZITHROMAX) 500 mg in sodium chloride 0.9 % 250 mL IVPB        500 mg 250 mL/hr over 60 Minutes Intravenous  Once 06/12/20 1454 06/12/20 1904       Medication:  . amLODipine  10 mg Oral Daily  . atorvastatin  20 mg Oral QHS  . enoxaparin (LOVENOX) injection  40 mg Subcutaneous Q24H  . furosemide  40 mg Intravenous BID  . gabapentin  600 mg Oral TID  . lisinopril  10 mg Oral Daily  . oxyCODONE  15 mg Oral QID  . pantoprazole  40 mg Oral BID  . potassium chloride SA  20 mEq Oral BID  . QUEtiapine  200 mg Oral QHS  . rOPINIRole  3 mg Oral QHS  . sodium chloride flush  3 mL Intravenous Q12H    sodium chloride, acetaminophen, ondansetron (ZOFRAN) IV, sodium chloride flush   Objective:   Vitals:   06/12/20 2214 06/13/20 0007 06/13/20 0030 06/13/20 0406  BP: (!) 156/98 (!) 92/51  (!) 91/56  Pulse: 93 88  69  Resp: (!) 22 18  18   Temp: 98.6 F (37 C) 98 F (36.7 C)  (!) 97.2 F (36.2 C)  TempSrc: Oral     SpO2: 97% 95%  91%   Weight:   71.2 kg   Height:   5\' 4"  (1.626 m)     Intake/Output Summary (Last 24 hours) at 06/13/2020 Last data filed at 06/12/2020 2242 Gross per 24 hour  Intake 350 ml  Output 4000 ml  Net -3650 ml   Filed Weights   06/13/20 0030  Weight: 71.2 kg     Examination:   Physical Exam  Constitution:  Alert, cooperative, no distress,  Appears calm and comfortable  Psychiatric: Normal and stable mood and affect, cognition intact,   HEENT: Normocephalic, PERRL, otherwise with in Normal limits  Chest:Chest symmetric Cardio vascular:  S1/S2, RRR, No murmure, No Rubs or Gallops  pulmonary: Clear to auscultation bilaterally, respirations unlabored, negative wheezes / crackles Abdomen: Soft, non-tender, non-distended, bowel sounds,no masses, no organomegaly Muscular skeletal: Limited exam - in bed, able to move all 4 extremities, Normal strength,  Neuro: CNII-XII intact. , normal motor and sensation, reflexes intact  Extremities: No pitting edema lower extremities, +2 pulses  Skin: Dry, warm to touch, negative for any Rashes, No open wounds Wounds: per nursing documentation    ------------------------------------------------------------------------------------------------------------------------------------------    LABs:  CBC Latest Ref Rng & Units 06/13/2020 06/12/2020 01/31/2020  WBC 4.0 - 10.5 K/uL 5.0 8.5 6.5  Hemoglobin 12.0 - 15.0 g/dL 8.0(L) 9.1(L) 11.1(L)  Hematocrit 36.0 - 46.0 % 28.0(L) 31.8(L) 37.0  Platelets 150 - 400 K/uL 284 366 496(H)   CMP Latest Ref Rng & Units 06/12/2020 01/31/2020 01/30/2020  Glucose 70 - 99 mg/dL 454(U) 981(X) 914(N)  BUN 8 - 23 mg/dL 82(N) 23 56(O)  Creatinine 0.44 - 1.00 mg/dL 1.30 8.65 7.84(O)  Sodium 135 - 145 mmol/L 137 133(L) 133(L)  Potassium 3.5 - 5.1 mmol/L 3.7 3.4(L) 3.0(L)  Chloride 98 - 111 mmol/L 103 95(L) 95(L)  CO2 22 - 32 mmol/L 25 28 30   Calcium 8.9 - 10.3 mg/dL 8.2(L) 8.7(L) 8.7(L)  Total Protein 6.5 - 8.1 g/dL 6.5 - 6.3(L)   Total Bilirubin 0.3 - 1.2 mg/dL 0.6 - 0.7  Alkaline Phos 38 - 126 U/L 109 - 87  AST 15 - 41 U/L 31 - 8(L)  ALT 0 - 44 U/L 24 - 8       Micro Results Recent Results (from the past 240 hour(s))  Blood culture (routine x 2)     Status: None (Preliminary result)   Collection Time: 06/12/20  3:05 PM   Specimen: BLOOD LEFT HAND  Result Value Ref Range Status   Specimen Description BLOOD LEFT HAND  Final   Special Requests   Final    Blood Culture adequate volume BOTTLES DRAWN AEROBIC AND ANAEROBIC   Culture   Final    NO GROWTH < 24 HOURS Performed at [facility], [address], [place], [place]    Report Status PENDING  Incomplete  Blood culture (routine x 2)     Status: None (Preliminary result)   Collection Time: 06/12/20  3:05 PM   Specimen: BLOOD RIGHT HAND  Result Value Ref Range Status   Specimen Description BLOOD RIGHT HAND  Final   Special Requests   Final    Blood Culture results may not be optimal due to an inadequate volume of blood received in culture bottles BOTTLES DRAWN AEROBIC AND ANAEROBIC   Culture   Final    NO GROWTH < 24 HOURS Performed at [facility], [address] [place], [place]    Report Status PENDING  Incomplete  Resp Panel by RT-PCR (Flu A&B, Covid) Nasopharyngeal Swab     Status: None   Collection Time: 06/12/20  3:51 PM   Specimen: Nasopharyngeal Swab; Nasopharyngeal(NP) swabs in vial transport medium  Result Value Ref Range Status   SARS Coronavirus 2 by RT PCR NEGATIVE NEGATIVE Final    Comment: (NOTE) SARS-CoV-2 target nucleic acids are NOT DETECTED.  The SARS-CoV-2 RNA is generally detectable in upper respiratory specimens during the acute phase of infection. The lowest concentration of SARS-CoV-2 viral copies this assay can detect is 138 copies/mL. A negative result does not preclude SARS-Cov-2 infection and should not be used as the sole basis for treatment or other patient management decisions. A negative result  may occur with  improper specimen collection/handling, submission of specimen other than nasopharyngeal swab, presence of viral mutation(s) within the areas targeted by this assay, and inadequate number of viral copies(<138 copies/mL). A negative result must be combined with clinical observations, patient history, and epidemiological information. The  expected result is Negative.  Fact Sheet for Patients:  BloggerCourse.com  Fact Sheet for Healthcare Providers:  SeriousBroker.it  This test is no t yet approved or cleared by the [place] FDA and  has been authorized for detection and/or diagnosis of SARS-CoV-2 by FDA under an Emergency Use Authorization (EUA). This EUA will remain  in effect (meaning this test can be used) for the duration of the COVID-19 declaration under Section 564(b)(1) of the Act, 21 U.S.C.section 360bbb-3(b)(1), unless the authorization is terminated  or revoked sooner.       Influenza A by PCR NEGATIVE NEGATIVE Final   Influenza B by PCR NEGATIVE NEGATIVE Final    Comment: (NOTE) The Xpert Xpress SARS-CoV-2/FLU/RSV plus assay is intended as an aid in the diagnosis of influenza from Nasopharyngeal swab specimens and should not be used as a sole basis for treatment. Nasal washings and aspirates are unacceptable for Xpert Xpress SARS-CoV-2/FLU/RSV testing.  Fact Sheet for Patients: BloggerCourse.com  Fact Sheet for Healthcare Providers: SeriousBroker.it  This test is not yet approved or cleared by the [place] FDA and has been authorized for detection and/or diagnosis of SARS-CoV-2 by FDA under an Emergency Use Authorization (EUA). This EUA will remain in effect (meaning this test can be used) for the duration of the COVID-19 declaration under Section 564(b)(1) of the Act, 21 U.S.C. section 360bbb-3(b)(1), unless the authorization is terminated  or revoked.  Performed at [facility], [address], [place], [place] [address]     Radiology Reports DG Chest 1 View  Result Date: 06/12/2020 CLINICAL DATA:  Shortness of breath EXAM: CHEST  1 VIEW COMPARISON:  January 15, 2020 FINDINGS: There is ill-defined airspace opacity in the right upper lobe. There is scarring in the left base. The heart is enlarged with pulmonary vascularity normal, stable. There is aortic atherosclerosis. Bones are osteoporotic. There is extensive arthropathy in the right shoulder. IMPRESSION: Ill-defined airspace opacity consistent with pneumonia right upper lobe. Scarring left base. Stable cardiac enlargement. Bones osteoporotic. Aortic Atherosclerosis (ICD10-I70.0). Electronically Signed   By: Bretta Bang III M.D.   On: 06/12/2020 14:47   CT Angio Chest PE W and/or Wo Contrast  Result Date: 06/12/2020 CLINICAL DATA:  80 year old female with shortness of breath. EXAM: CT ANGIOGRAPHY CHEST WITH CONTRAST TECHNIQUE: Multidetector CT imaging of the chest was performed using the standard protocol during bolus administration of intravenous contrast. Multiplanar CT image reconstructions and MIPs were obtained to evaluate the vascular anatomy. CONTRAST:  Seventy-five mL Omnipaque 350, intravenous COMPARISON:  01/04/2020 FINDINGS: Cardiovascular: Satisfactory opacification of the pulmonary arteries to the segmental level. No evidence of pulmonary embolism. Similar appearing moderate global cardiomegaly. Mitral annular calcifications. Severe coronary atherosclerotic calcifications. Scattered atherosclerotic calcifications of the thoracic aorta. No pericardial effusion. Mediastinum/Nodes: No enlarged mediastinal, hilar, or axillary lymph nodes. The esophagus is patulous throughout with a standing column of enteric contents. Thyroid gland and trachea demonstrate no significant findings. Lungs/Pleura: Bibasilar and lingular subsegmental atelectasis. Mild upper lobe predominant  centrilobular emphysema. No suspicious pulmonary nodules. No pleural effusion or pneumothorax. 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 The visualized upper abdomen is otherwise within normal limits. Musculoskeletal: Progressive compression deformity of the T9 vertebral body with associated kyphosis at this level, now nearly vertebra plana morphology. Mild, unchanged retropulsion. 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 Multilevel chronic, bilateral posterolateral nondisplaced rib fractures. Advanced degenerative changes of the glenohumeral joints bilaterally. No acute osseous abnormality. Review of the  MIP images confirms the above findings. IMPRESSION: Vascular: 1. No evidence of pulmonary embolism. 2. Unchanged moderate global cardiomegaly. 3. Severe coronary atherosclerotic calcifications. 4.  Aortic Atherosclerosis (ICD10-I70.0). Non-Vascular: 1. Bibasilar and lingular subsegmental atelectasis. 2. Progressive compression deformity of the T9 vertebral body, now essentially vertebra plana. Unchanged mild retropulsion. Marliss Coots, MD [facility] Radiology Electronically Signed   By: Marliss Coots MD   On: 06/12/2020 16:45   US Venous Img Lower  Left (DVT Study)  Result Date: 06/12/2020 CLINICAL DATA:  Lower extremity pain and edema EXAM: LEFT LOWER EXTREMITY VENOUS DUPLEX ULTRASOUND TECHNIQUE: Gray-scale sonography with graded compression, as well as color Doppler and duplex ultrasound were performed to evaluate the left lower extremity deep venous system from the level of the common femoral vein and including the common femoral, femoral, profunda femoral, popliteal and calf veins including the posterior tibial, peroneal and gastrocnemius veins when visible. The superficial great saphenous vein was also interrogated. Spectral Doppler was utilized to  evaluate flow at rest and with distal augmentation maneuvers in the common femoral, femoral and popliteal veins. COMPARISON:  None. FINDINGS: Contralateral Common Femoral Vein: Respiratory phasicity is normal and symmetric with the symptomatic side. No evidence of thrombus. Normal compressibility. Common Femoral Vein: No evidence of thrombus. Normal compressibility, respiratory phasicity and response to augmentation. Saphenofemoral Junction: No evidence of thrombus. Normal compressibility and flow on color Doppler imaging. Profunda Femoral Vein: No evidence of thrombus. Normal compressibility and flow on color Doppler imaging. Femoral Vein: No evidence of thrombus. Normal compressibility, respiratory phasicity and response to augmentation. Popliteal Vein: No evidence of thrombus. Normal compressibility, respiratory phasicity and response to augmentation. Calf Veins: No evidence of thrombus. Normal compressibility and flow on color Doppler imaging. Superficial Great Saphenous Vein: No evidence of thrombus. Normal compressibility. Venous Reflux:  None. Other Findings:  Soft tissue edema noted in the calf region. IMPRESSION: No evidence of deep venous thrombosis in the left lower extremity. Right common femoral vein patent. There is left calf region soft tissue edema. Electronically Signed   By: Bretta Bang III M.D.   On: 06/12/2020 14:46    SIGNED: Kendell Bane, MD, FHM. [facility],  Pager (please use [email or web address] to page/text) Please use Epic Secure Chat for non-urgent communication (7AM-7PM)  If 7PM-7AM, please contact night-coverage [email or web address], 06/13/2020, 6:55 AM

## 2020-06-14 DIAGNOSIS — K219 Gastro-esophageal reflux disease without esophagitis: Secondary | ICD-10-CM | POA: Diagnosis not present

## 2020-06-14 DIAGNOSIS — Z8719 Personal history of other diseases of the digestive system: Secondary | ICD-10-CM

## 2020-06-14 DIAGNOSIS — E782 Mixed hyperlipidemia: Secondary | ICD-10-CM | POA: Diagnosis not present

## 2020-06-14 DIAGNOSIS — J9601 Acute respiratory failure with hypoxia: Secondary | ICD-10-CM | POA: Diagnosis not present

## 2020-06-14 DIAGNOSIS — I5033 Acute on chronic diastolic (congestive) heart failure: Secondary | ICD-10-CM | POA: Diagnosis not present

## 2020-06-14 LAB — BASIC METABOLIC PANEL
Anion gap: 8 (ref 5–15)
BUN: 16 mg/dL (ref 8–23)
CO2: 34 mmol/L — ABNORMAL HIGH (ref 22–32)
Calcium: 8.2 mg/dL — ABNORMAL LOW (ref 8.9–10.3)
Chloride: 99 mmol/L (ref 98–111)
Creatinine, Ser: 0.67 mg/dL (ref 0.44–1.00)
GFR, Estimated: >60 mL/min (ref >60)
Glucose, Bld: 84 mg/dL (ref 70–99)
Potassium: 4.5 mmol/L (ref 3.5–5.1)
Sodium: 141 mmol/L (ref 135–145)

## 2020-06-14 LAB — BRAIN NATRIURETIC PEPTIDE: B Natriuretic Peptide: 589 pg/mL — ABNORMAL HIGH (ref 0.0–100.0)

## 2020-06-14 MED ORDER — OXYCODONE HCL 5 MG PO TABS
15.0000 mg | ORAL_TABLET | Freq: Four times a day (QID) | ORAL | Status: DC | PRN
  Administered 2020-06-15 – 2020-06-17 (×4): 15 mg via ORAL
  Filled 2020-06-14 (×5): qty 3

## 2020-06-14 NOTE — Progress Notes (Signed)
PROGRESS NOTE    Patient: Brenda Hopkins                            PCP: Samuella Bruin                    DOB: [birth year]            DOA: 06/12/2020 ZOX:[id number]             DOS: 06/14/2020, 10:03 AM   LOS: 2 days   Date of Service: The patient was seen and examined on 06/14/2020  Subjective:   The patient was seen and examined this morning, laying in chair, nursing staff present at bedside.  Once again patient developed epigastric pain this morning post having breakfast.  Then developed nausea vomiting.  Brief Narrative:   Kamarri Fischetti  is a 80 y.o. female, with medical history significant forhypertension, anxiety, marginalulcer, tobacco abuseandchronic pain due to complaints of shortness of breath, reports symptoms going on for last week, reports intense with exertion, and laying flat, as well she does report some epigastric/lower chest pain, does not radiate, as well she does report worsening lower extremity edema, denies fever, chills, cough, nausea or vomiting,.  she denies any dysuria or polyuria, reports she was recently seen by her PCP for lower extremity edema which she was started on Lasix for last 4 days, report was told to have A. fib, and referral has been made to cardiologist but she did not get to see them yet, as well she does report history of COPD, but she denies any cough, productive sputum or wheezing.  -Was significant for BNP of 1270, chest x-ray significant for vascular congestion, EKG nonacute, troponins non-ACS pattern 71>63, was given IV Lasix, and [facility] consulted to admit for CHF.   Assessment & Plan:   Active Problems:   Hypertension   Hyperlipidemia   GERD (gastroesophageal reflux disease)   Acute respiratory failure with hypoxia (HCC)   Acute on chronic diastolic CHF (congestive heart failure) (HCC)   Acute on chronic diastolic CHF -Stable, improved, on 2 L of oxygen satting 90% -BNP 1270 >>> -Troponin 71, 63, 43, 28,  29 -elevated BNP, vascular congestion on imaging, worsening lower extremity edema. -Continue Lasix 40 mg daily weight, I's and O's -2D echocardiogram >> reviewed ejection fraction 55-60%, diastolic congestive heart failure grade 1 -Considering consulting cardiology -She is admitted under CHF pathway, she will be started on IV Lasix, daily weights, strict ins and outs. -Given sudden onset of volume overload symptoms, will repeat 2D echo  -CTA: Reviewed-pulmonary embolism, cardiomegaly, severe coronary atherosclerosis, aortic atherosclerosis,.. Bilateral sublingual atelectasis, T9 vertebral compression deformity   ?Atrial fibrillation -Possible ?  Paroxysmal new onset -Reported by PCP, current EKG normal sinus rhythm -TSH normal at 1.24. -We will continue to monitor, remained to be in normal sinus rhythm  Epigastric chest pain -Post prandial with every meal developed gastric pain then followed with nausea vomiting -No relief from Protonix, Carafate, Maalox -Consulting gastroenterology for evaluation  Chest pain -Likely mostly epigastric -Repeating EKGs within normal limits, -Atypical she had some reproducible epigastric pain on palpation,  - EKG nonacute, ACS ruled out, troponin 71> 63 >> 43, 28, 29 -As needed nitroglycerin aspirin oxygen    COPD-No active wheezing, no with home medications... Remains on 2 L of oxygen 90%  Possible [facility] pneumonia/bronchitis -Blood cultures been obtained, continue current IV antibiotics (Rocephin/azithromycin  Hyperlipidemia -Continue  with statin  Hypertension -Continue with Norvasc and lisinopril  Chronic pain syndrome -Continue with home medications     ---------------------------------------------------------------------------------------------------------------------------------------- Nutritional status:  The patient's BMI is: Body mass index is 26 kg/m. I agree with the assessment and plan as outlined  ---------------------------------------------------------------------------------------------------------------------------------------- Cultures; Blood cultures x2  Antimicrobials: IV Rocephin/azithromycin  Consultants: Cardiology  ---------------------------------------------------------------------------------------------------------------------------------------- DVT prophylaxis:  SCD/Compression stockings and Lovenox SQ Code Status:   Code Status: Full Code  Family Communication:  No family member present at bedside-  The above findings and plan of care has been discussed with patient  in detail,  they expressed understanding and agreement of above. -Advance care planning has been discussed.   Admission status:   Status is: Inpatient  Remains inpatient appropriate because:Hemodynamically unstable and Inpatient level of care appropriate due to severity of illness   Dispo: The patient is from: Home              Anticipated d/c is to: Home              Patient currently is not medically stable to d/c.   Difficult to place patient No      Level of care: Telemetry   Procedures:   No admission procedures for hospital encounter.    Antimicrobials:  Anti-infectives (From admission, onward)   Start     Dose/Rate Route Frequency Ordered Stop   06/12/20 1500  cefTRIAXone (ROCEPHIN) 1 g in sodium chloride 0.9 % 100 mL IVPB        1 g 200 mL/hr over 30 Minutes Intravenous  Once 06/12/20 1454 06/12/20 1756   06/12/20 1500  azithromycin (ZITHROMAX) 500 mg in sodium chloride 0.9 % 250 mL IVPB        500 mg 250 mL/hr over 60 Minutes Intravenous  Once 06/12/20 1454 06/12/20 1904       Medication:  . amLODipine  10 mg Oral Daily  . atorvastatin  20 mg Oral QHS  . enoxaparin (LOVENOX) injection  40 mg Subcutaneous Q24H  . furosemide  40 mg Intravenous BID  . gabapentin  600 mg Oral TID  . lisinopril  10 mg Oral Daily  . oxyCODONE  15 mg Oral QID  . pantoprazole  40  mg Oral BID  . potassium chloride SA  20 mEq Oral BID  . QUEtiapine  200 mg Oral QHS  . rOPINIRole  3 mg Oral QHS  . sodium chloride flush  3 mL Intravenous Q12H  . sucralfate  1 g Oral TID with meals    sodium chloride, acetaminophen, alum & mag hydroxide-simeth, nitroGLYCERIN, ondansetron (ZOFRAN) IV, sodium chloride flush   Objective:   Vitals:   06/13/20 2044 06/14/20 0313 06/14/20 0448 06/14/20 0851  BP: 121/77 99/62  (!) 139/93  Pulse: 96 94    Resp: 18 18    Temp: 98 F (36.7 C) 98.2 F (36.8 C)    TempSrc:      SpO2: 98% 90%    Weight:   68.7 kg   Height:        Intake/Output Summary (Last 24 hours) at 06/14/2020 1003 Last data filed at 06/14/2020 0900 Gross per 24 hour  Intake 360 ml  Output 1200 ml  Net -840 ml   Filed Weights   06/13/20 0030 06/14/20 0448  Weight: 71.2 kg 68.7 kg     Examination:   .   Physical Exam:   General:  Alert, oriented, cooperative, mild-moderate distress complaining of epigastric pain  HEENT:  Normocephalic, PERRL,  otherwise with in Normal limits   Neuro:  CNII-XII intact. , normal motor and sensation, reflexes intact   Lungs:   Clear to auscultation BL, Respirations unlabored, no wheezes / crackles  Cardio:    S1/S2, RRR, No murmure, No Rubs or Gallops   Abdomen:    Epigastric abdominal pain -otherwise soft, epigastric tenderness , bowel sounds active all four quadrants,  no guarding or peritoneal signs.  Muscular skeletal:  Limited exam - in bed, able to move all 4 extremities, Normal strength,  2+ pulses,  symmetric, No pitting edema  Skin:  Dry, warm to touch, negative for any Rashes,  Wounds: Please see nursing documentation         ------------------------------------------------------------------------------------------------------------------------------------------    LABs:  CBC Latest Ref Rng & Units 06/13/2020 06/12/2020 01/31/2020  WBC 4.0 - 10.5 K/uL 5.0 8.5 6.5  Hemoglobin 12.0 - 15.0 g/dL 8.0(L) 9.1(L)  11.1(L)  Hematocrit 36.0 - 46.0 % 28.0(L) 31.8(L) 37.0  Platelets 150 - 400 K/uL 284 366 496(H)   CMP Latest Ref Rng & Units 06/14/2020 06/13/2020 06/12/2020  Glucose 70 - 99 mg/dL 84 85 268(T)  BUN 8 - 23 mg/dL 16 23 41(D)  Creatinine 0.44 - 1.00 mg/dL 6.22 2.97 9.89  Sodium 135 - 145 mmol/L 141 142 137  Potassium 3.5 - 5.1 mmol/L 4.5 4.1 3.7  Chloride 98 - 111 mmol/L 99 106 103  CO2 22 - 32 mmol/L 34(H) 29 25  Calcium 8.9 - 10.3 mg/dL 8.2(L) 8.0(L) 8.2(L)  Total Protein 6.5 - 8.1 g/dL - - 6.5  Total Bilirubin 0.3 - 1.2 mg/dL - - 0.6  Alkaline Phos 38 - 126 U/L - - 109  AST 15 - 41 U/L - - 31  ALT 0 - 44 U/L - - 24       Micro Results Recent Results (from the past 240 hour(s))  Blood culture (routine x 2)     Status: None (Preliminary result)   Collection Time: 06/12/20  3:05 PM   Specimen: BLOOD LEFT HAND  Result Value Ref Range Status   Specimen Description BLOOD LEFT HAND  Final   Special Requests   Final    Blood Culture adequate volume BOTTLES DRAWN AEROBIC AND ANAEROBIC   Culture   Final    NO GROWTH < 24 HOURS Performed at [facility], [address] [place], [place] [address]    Report Status PENDING  Incomplete  Blood culture (routine x 2)     Status: None (Preliminary result)   Collection Time: 06/12/20  3:05 PM   Specimen: BLOOD RIGHT HAND  Result Value Ref Range Status   Specimen Description BLOOD RIGHT HAND  Final   Special Requests   Final    Blood Culture results may not be optimal due to an inadequate volume of blood received in culture bottles BOTTLES DRAWN AEROBIC AND ANAEROBIC   Culture   Final    NO GROWTH < 24 HOURS Performed at [facility], [address] [place], [place] [address]    Report Status PENDING  Incomplete  Resp Panel by RT-PCR (Flu A&B, Covid) Nasopharyngeal Swab     Status: None   Collection Time: 06/12/20  3:51 PM   Specimen: Nasopharyngeal Swab; Nasopharyngeal(NP) swabs in vial transport medium  Result Value Ref Range Status    SARS Coronavirus 2 by RT PCR NEGATIVE NEGATIVE Final    Comment: (NOTE) SARS-CoV-2 target nucleic acids are NOT DETECTED.  The SARS-CoV-2 RNA is generally detectable in upper respiratory specimens  during the acute phase of infection. The lowest concentration of SARS-CoV-2 viral copies this assay can detect is 138 copies/mL. A negative result does not preclude SARS-Cov-2 infection and should not be used as the sole basis for treatment or other patient management decisions. A negative result may occur with  improper specimen collection/handling, submission of specimen other than nasopharyngeal swab, presence of viral mutation(s) within the areas targeted by this assay, and inadequate number of viral copies(<138 copies/mL). A negative result must be combined with clinical observations, patient history, and epidemiological information. The expected result is Negative.  Fact Sheet for Patients:  BloggerCourse.com  Fact Sheet for Healthcare Providers:  SeriousBroker.it  This test is no t yet approved or cleared by the [place] FDA and  has been authorized for detection and/or diagnosis of SARS-CoV-2 by FDA under an Emergency Use Authorization (EUA). This EUA will remain  in effect (meaning this test can be used) for the duration of the COVID-19 declaration under Section 564(b)(1) of the Act, 21 U.S.C.section 360bbb-3(b)(1), unless the authorization is terminated  or revoked sooner.       Influenza A by PCR NEGATIVE NEGATIVE Final   Influenza B by PCR NEGATIVE NEGATIVE Final    Comment: (NOTE) The Xpert Xpress SARS-CoV-2/FLU/RSV plus assay is intended as an aid in the diagnosis of influenza from Nasopharyngeal swab specimens and should not be used as a sole basis for treatment. Nasal washings and aspirates are unacceptable for Xpert Xpress SARS-CoV-2/FLU/RSV testing.  Fact Sheet for  Patients: BloggerCourse.com  Fact Sheet for Healthcare Providers: SeriousBroker.it  This test is not yet approved or cleared by the [place] FDA and has been authorized for detection and/or diagnosis of SARS-CoV-2 by FDA under an Emergency Use Authorization (EUA). This EUA will remain in effect (meaning this test can be used) for the duration of the COVID-19 declaration under Section 564(b)(1) of the Act, 21 U.S.C. section 360bbb-3(b)(1), unless the authorization is terminated or revoked.  Performed at [facility], [address], [place], [place] [address]     Radiology Reports DG Chest 1 View  Result Date: 06/12/2020 CLINICAL DATA:  Shortness of breath EXAM: CHEST  1 VIEW COMPARISON:  January 15, 2020 FINDINGS: There is ill-defined airspace opacity in the right upper lobe. There is scarring in the left base. The heart is enlarged with pulmonary vascularity normal, stable. There is aortic atherosclerosis. Bones are osteoporotic. There is extensive arthropathy in the right shoulder. IMPRESSION: Ill-defined airspace opacity consistent with pneumonia right upper lobe. Scarring left base. Stable cardiac enlargement. Bones osteoporotic. Aortic Atherosclerosis (ICD10-I70.0). Electronically Signed   By: Bretta Bang III M.D.   On: 06/12/2020 14:47   CT Angio Chest PE W and/or Wo Contrast  Result Date: 06/12/2020 CLINICAL DATA:  80 year old female with shortness of breath. EXAM: CT ANGIOGRAPHY CHEST WITH CONTRAST TECHNIQUE: Multidetector CT imaging of the chest was performed using the standard protocol during bolus administration of intravenous contrast. Multiplanar CT image reconstructions and MIPs were obtained to evaluate the vascular anatomy. CONTRAST:  Seventy-five mL Omnipaque 350, intravenous COMPARISON:  01/04/2020 FINDINGS: Cardiovascular: Satisfactory opacification of the pulmonary arteries to the segmental level. No evidence of  pulmonary embolism. Similar appearing moderate global cardiomegaly. Mitral annular calcifications. Severe coronary atherosclerotic calcifications. Scattered atherosclerotic calcifications of the thoracic aorta. No pericardial effusion. Mediastinum/Nodes: No enlarged mediastinal, hilar, or axillary lymph nodes. The esophagus is patulous throughout with a standing column of enteric contents. Thyroid gland and trachea demonstrate no significant findings. Lungs/Pleura: Bibasilar and lingular  subsegmental atelectasis. Mild upper lobe predominant centrilobular emphysema. No suspicious pulmonary nodules. No pleural effusion or pneumothorax. 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 The visualized upper abdomen is otherwise within normal limits. Musculoskeletal: Progressive compression deformity of the T9 vertebral body with associated kyphosis at this level, now nearly vertebra plana morphology. Mild, unchanged retropulsion. 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 Multilevel chronic, bilateral posterolateral nondisplaced rib fractures. Advanced degenerative changes of the glenohumeral joints bilaterally. No acute osseous abnormality. Review of the MIP images confirms the above findings. IMPRESSION: Vascular: 1. No evidence of pulmonary embolism. 2. Unchanged moderate global cardiomegaly. 3. Severe coronary atherosclerotic calcifications. 4.  Aortic Atherosclerosis (ICD10-I70.0). Non-Vascular: 1. Bibasilar and lingular subsegmental atelectasis. 2. Progressive compression deformity of the T9 vertebral body, now essentially vertebra plana. Unchanged mild retropulsion. Marliss Coots, MD [facility] Rad[facility] Electronically Signed   By: Marliss Coots MD   On: 06/12/2020 16:45   US Venous Img Lower  Left (DVT Study)  Result Date: 06/12/2020 CLINICAL DATA:  Lower extremity  pain and edema EXAM: LEFT LOWER EXTREMITY VENOUS DUPLEX ULTRASOUND TECHNIQUE: Gray-scale sonography with graded compression, as well as color Doppler and duplex ultrasound were performed to evaluate the left lower extremity deep venous system from the level of the common femoral vein and including the common femoral, femoral, profunda femoral, popliteal and calf veins including the posterior tibial, peroneal and gastrocnemius veins when visible. The superficial great saphenous vein was also interrogated. Spectral Doppler was utilized to evaluate flow at rest and with distal augmentation maneuvers in the common femoral, femoral and popliteal veins. COMPARISON:  None. FINDINGS: Contralateral Common Femoral Vein: Respiratory phasicity is normal and symmetric with the symptomatic side. No evidence of thrombus. Normal compressibility. Common Femoral Vein: No evidence of thrombus. Normal compressibility, respiratory phasicity and response to augmentation. Saphenofemoral Junction: No evidence of thrombus. Normal compressibility and flow on color Doppler imaging. Profunda Femoral Vein: No evidence of thrombus. Normal compressibility and flow on color Doppler imaging. Femoral Vein: No evidence of thrombus. Normal compressibility, respiratory phasicity and response to augmentation. Popliteal Vein: No evidence of thrombus. Normal compressibility, respiratory phasicity and response to augmentation. Calf Veins: No evidence of thrombus. Normal compressibility and flow on color Doppler imaging. Superficial Great Saphenous Vein: No evidence of thrombus. Normal compressibility. Venous Reflux:  None. Other Findings:  Soft tissue edema noted in the calf region. IMPRESSION: No evidence of deep venous thrombosis in the left lower extremity. Right common femoral vein patent. There is left calf region soft tissue edema. Electronically Signed   By: Bretta Bang III M.D.   On: 06/12/2020 14:46   ECHOCARDIOGRAM COMPLETE  Result  Date: 06/13/2020    ECHOCARDIOGRAM REPORT   Patient Name:   SOPHIYA MORELLO Date of Exam: 06/13/2020 Medical Rec #:  [id number]           Height:       64.0 in Accession #:    [id number]          Weight:       157.0 lb Date of Birth:  14-Jan-1941           BSA:          1.765 m Patient Age:    79 years            BP:           108/55 mmHg Patient Gender: F  HR:           91 bpm. [facility] Procedure: 2D Echo Indications:    CHF-Acute Diastolic I50.31  History:        Patient has prior history of Echocardiogram examinations, most                 recent 01/16/2020. Risk Factors:Hypertension, Dyslipidemia and                 Former Smoker.  Sonographer:    Jeryl ColumbiaJohanna Elliott RDCS (AE) Referring Phys: [id number] [address] IMPRESSIONS  1. Left ventricular ejection fraction, by estimation, is 55 to 60%. The left ventricle has normal function. The left ventricle has no regional wall motion abnormalities. There is mild concentric left ventricular hypertrophy of the basal-septal segment. Left ventricular diastolic parameters are consistent with Grade I diastolic dysfunction (impaired relaxation).  2. Right ventricular systolic function is normal. The right ventricular size is normal. There is mildly elevated pulmonary artery systolic pressure. The estimated right ventricular systolic pressure is 42.0 mmHg.  3. The mitral valve is grossly normal. No evidence of mitral valve regurgitation. No evidence of mitral stenosis. Severe mitral annular calcification.  4. The aortic valve was not well visualized. There is mild calcification of the aortic valve. Aortic valve regurgitation is not visualized. No aortic stenosis is present.  5. The inferior vena cava is dilated in size with <50% respiratory variability, suggesting right atrial pressure of 15 mmHg. Comparison(s): A prior study was performed on 01/16/2020. Left ventricle is less hyperdynamic. FINDINGS  Left Ventricle: Left ventricular ejection  fraction, by estimation, is 55 to 60%. The left ventricle has normal function. The left ventricle has no regional wall motion abnormalities. The left ventricular internal cavity size was normal in size. There is  mild concentric left ventricular hypertrophy of the basal-septal segment. Left ventricular diastolic parameters are consistent with Grade I diastolic dysfunction (impaired relaxation). Right Ventricle: The right ventricular size is normal. No increase in right ventricular wall thickness. Right ventricular systolic function is normal. There is mildly elevated pulmonary artery systolic pressure. The tricuspid regurgitant velocity is 2.60  m/s, and with an assumed right atrial pressure of 15 mmHg, the estimated right ventricular systolic pressure is 42.0 mmHg. Left Atrium: Left atrial size was normal in size. Right Atrium: Right atrial size was normal in size. Pericardium: There is no evidence of pericardial effusion. Mitral Valve: The mitral valve is grossly normal. There is mild thickening of the mitral valve leaflet(s). There is moderate calcification of the mitral valve leaflet(s). Severe mitral annular calcification. No evidence of mitral valve regurgitation. No evidence of mitral valve stenosis. MV peak gradient, 6.7 mmHg. The mean mitral valve gradient is 3.0 mmHg with average heart rate of 98 bpm. Tricuspid Valve: The tricuspid valve is grossly normal. Tricuspid valve regurgitation is trivial. No evidence of tricuspid stenosis. Aortic Valve: The aortic valve was not well visualized. There is mild calcification of the aortic valve. Aortic valve regurgitation is not visualized. No aortic stenosis is present. Pulmonic Valve: The pulmonic valve was not well visualized. Pulmonic valve regurgitation is not visualized. No evidence of pulmonic stenosis. Aorta: The aortic root is normal in size and structure. Venous: The inferior vena cava is dilated in size with less than 50% respiratory variability,  suggesting right atrial pressure of 15 mmHg. IAS/Shunts: The atrial septum is grossly normal.  LEFT VENTRICLE PLAX 2D LVIDd:         4.18 cm  Diastology LVIDs:         2.99 cm  LV e' medial:    5.00 cm/s LV PW:         1.40 cm  LV E/e' medial:  18.3 LV IVS:        1.35 cm  LV e' lateral:   4.24 cm/s LVOT diam:     2.10 cm  LV E/e' lateral: 21.5 LVOT Area:     3.46 cm  RIGHT VENTRICLE RV S prime:     14.30 cm/s TAPSE (M-mode): 2.4 cm LEFT ATRIUM             Index       RIGHT ATRIUM           Index LA diam:        4.30 cm 2.44 cm/m  RA Area:     14.40 cm LA Vol (A2C):   52.3 ml 29.63 ml/m RA Volume:   35.60 ml  20.17 ml/m LA Vol (A4C):   48.3 ml 27.37 ml/m LA Biplane Vol: 52.1 ml 29.52 ml/m   AORTA Ao Root diam: 2.50 cm MITRAL VALVE                TRICUSPID VALVE MV Area (PHT): 3.36 cm     TR Peak grad:   27.0 mmHg MV Peak grad:  6.7 mmHg     TR Vmax:        260.00 cm/s MV Mean grad:  3.0 mmHg MV Vmax:       1.29 m/s     SHUNTS MV Vmean:      87.5 cm/s    Systemic Diam: 2.10 cm MV Decel Time: 226 msec MV E velocity: 91.30 cm/s MV A velocity: 120.00 cm/s MV E/A ratio:  0.76 Riley Lam MD Electronically signed by Riley Lam MD Signature Date/Time: 06/13/2020/3:04:50 PM    Final     SIGNED: Kendell Bane, MD, FHM. [facility],  Pager (please use [email or web address] to page/text) Please use Epic Secure Chat for non-urgent communication (7AM-7PM)  If 7PM-7AM, please contact night-coverage [email or web address], 06/14/2020, 10:03 AM

## 2020-06-14 NOTE — Progress Notes (Signed)
Pt has been asleep most of the afternoon, 15mg  oxycodone held. Pt has not complained of pain and has been resting. MD notified and orders modified

## 2020-06-14 NOTE — Consult Note (Signed)
Maylon Peppers, M.D. 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  Patient is a poor historian.  Based on medical record, the patient had worsening shortness of breath upon exertion for the last week along with orthopnea.  Notably she had presented for the last 2 days worsening epigastric abdominal pain along with recurrent episodes of nausea and intermittent episodes of vomiting bilious contents without hematemesis.  She reports that she was not having any of these symptoms before but due to the severity she decided to come to the ER.  Patient was found to have elevated BNP of 1270 with chest x-ray showing significant vascular congestion.  Heart very mildly elevated troponins.  CTA of the chest was negative for any PE, with presence of bibasilar atelectasis.  Rest of labs during admission showed hemoglobin of 9.1 with MCV of 92 (previous hemoglobin from 5 months ago was 11.1), also had presence of normal LFTs and renal function, normal electrolytes.  Patient was started on IV Lasix for management of decompensated heart failure and is currently on oxygen supplementation.  Most recent hemoglobin today was 8.0 with MCV of 92.  The patient denies having any melena or hematochezia, denies had any fever, chills, abdominal distention, changes in her weight.  She reported being compliant to the medications "she was prescribed on last time she was in the  hospital".  She does not remember the names of the medications she is currently taking but based on medical records she was taking Protonix 40 mg twice a day.  She denies taking any NSAIDs and reports only taking Tylenol but her list of medications states she is taking Celebrex.  Notably, the patient was admitted to the hospital in the past due to an episode of upper gastrointestinal bleeding.  Due to these the patient had an EGD on 01/17/2020 which showed presence of a 2 cm hiatal hernia, also had evidence of a Roux-en-Y gastric bypass with presence of a large anastomotic ulcer in the jejunal side close to 5 cm in extension without any presence of active bleeding.  Patient was counseled to take Prilosec 40 mg twice a day for 3 months and then continue with once a day indefinitely as well as to take Carafate 1 g twice a day.  She was advised to avoid any NSAIDs and to quit smoking.  She was also counseled to have a repeat EGD in 3 months but she did not follow-up with the GI clinic.  Notably, the patient had a CT of the abdomen and pelvis without IV contrast in November 2021 that showed some gallbladder distention without cholelithiasis.  Past Medical History: SEE CHRONIC ISSSUES: Past Medical History:  Diagnosis Date  . Chronic back pain   . GERD (gastroesophageal reflux disease)   . Hiatal hernia   . Hyperlipidemia   .  Hypertension   . Pelvic fracture ([place]) 12/12/2012  . Smoker    Past Surgical History:  Past Surgical History:  Procedure Laterality Date  . ABDOMINAL HYSTERECTOMY    . APPENDECTOMY    . ESOPHAGOGASTRODUODENOSCOPY (EGD) WITH PROPOFOL N/A 01/17/2020   Procedure: ESOPHAGOGASTRODUODENOSCOPY (EGD) WITH PROPOFOL;  Surgeon: Harvel Quale, MD;  [facility];  Service: Gastroenterology;  Laterality: N/A;  . 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 HEMORROIDECTOMY    . LUMBAR SPINE SURGERY    . OOPHORECTOMY    . TONSILLECTOMY     Family History: No family history on  file. Social History:  Social History   Tobacco Use  . Smoking status: Former Smoker    Packs/day: 0.50    Types: Cigarettes  . Smokeless tobacco: Former Systems developer    Quit date: 12/28/2012  Vaping Use  . Vaping Use: Every day  Substance Use Topics  . Alcohol use: No  . Drug use: No    Home Medications:  Prior to Admission medications   Medication Sig Start Date End Date Taking? Authorizing Provider  amLODipine (NORVASC) 10 MG tablet TAKE 1 TABLET BY MOUTH EVERY DAY 09/28/16  Yes Dena Billet B, PA-C  celecoxib (CELEBREX) 100 MG capsule Take 100 mg by mouth 2 (two) times daily. 05/04/20  Yes [provider]  diphenhydrAMINE (BENADRYL) 25 mg capsule Take 50 mg by mouth every 6 (six) hours as needed.   Yes [provider]  gabapentin (NEURONTIN) 600 MG tablet TAKE 1 TABLET BY MOUTH THREE TIMES DAILY 07/13/15  Yes Dena Billet B, PA-C  lisinopril (ZESTRIL) 10 MG tablet Take 10 mg by mouth daily. 12/04/19  Yes [provider]  oxyCODONE (ROXICODONE) 15 MG immediate release tablet Take 15 mg by mouth 4 (four) times daily.   Yes [provider]  pantoprazole (PROTONIX) 40 MG tablet Take 1 tablet (40 mg total) by mouth 2 (two) times daily. 01/31/20 04/30/20 Yes Emokpae, Courage, MD  sertraline (ZOLOFT) 25 MG tablet Take 25 mg by mouth daily. 05/04/20  Yes [provider]  Holly Bodily See admin instructions. follow package directions 06/05/20  Yes [provider]  atorvastatin (LIPITOR) 20 MG tablet TAKE 1 TABLET BY MOUTH EVERY NIGHT AT BEDTIME Patient not taking: No sig reported 07/11/16   Dena Billet B, PA-C  ondansetron (ZOFRAN) 4 MG tablet Take 1 tablet (4 mg total) by mouth every 6 (six) hours. Patient not taking: No sig reported 01/31/20   Roxan Hockey, MD  potassium chloride SA (KLOR-CON) 20 MEQ tablet Take 20 mEq by mouth 2 (two) times daily. Patient not taking: No sig reported 01/02/20   [provider]  QUEtiapine  (SEROQUEL) 200 MG tablet Take 200 mg by mouth at bedtime. 10/22/19   [provider]  rOPINIRole (REQUIP) 3 MG tablet Take 3 mg by mouth at bedtime. 10/23/19   [provider]  sucralfate (CARAFATE) 1 g tablet Take 1 tablet (1 g total) by mouth 2 (two) times daily. Patient not taking: Reported on 06/12/2020 01/31/20 03/01/20  Roxan Hockey, MD    Inpatient Medications:  Current Facility-Administered Medications:  .  0.9 %  sodium chloride infusion, 250 mL, Intravenous, PRN, Elgergawy, Silver Huguenin, MD .  acetaminophen (TYLENOL) tablet 650 mg, 650 mg, Oral, Q4H PRN, Elgergawy, Silver Huguenin, MD, 650 mg at 06/13/20 1941 .  alum & mag hydroxide-simeth (MAALOX/MYLANTA) 200-200-20 MG/5ML suspension 30 mL, 30 mL, Oral, Q4H PRN, Shahmehdi, Seyed A, MD, 30 mL at 06/13/20 1941 .  amLODipine (NORVASC) tablet 10 mg, 10 mg, Oral, Daily, Elgergawy, Silver Huguenin, MD, 10 mg at 06/14/20 0851 .  atorvastatin (LIPITOR) tablet 20 mg, 20 mg, Oral, QHS, Elgergawy, Silver Huguenin, MD, 20 mg at 06/13/20 2111 .  enoxaparin (LOVENOX) injection 40 mg, 40 mg, Subcutaneous, Q24H, Elgergawy, Silver Huguenin, MD, 40 mg at 06/13/20 2112 .  furosemide (LASIX) injection 40 mg, 40 mg, Intravenous, BID, Elgergawy, Silver Huguenin, MD, 40 mg at 06/14/20 0855 .  gabapentin (NEURONTIN) capsule 600 mg, 600 mg, Oral, TID, Elgergawy, Silver Huguenin, MD, 600 mg at 06/14/20 0854 .  lisinopril (ZESTRIL) tablet 10 mg, 10 mg, Oral, Daily, Elgergawy, Silver Huguenin, MD, 10 mg at 06/14/20 0854 .  nitroGLYCERIN (NITROSTAT) SL tablet 0.4 mg, 0.4 mg, Sublingual, Q5 min PRN, Shahmehdi, Seyed A, MD, 0.4 mg at 06/13/20 1332 .  ondansetron (ZOFRAN) injection 4 mg, 4 mg, Intravenous, Q6H PRN, Elgergawy, Silver Huguenin, MD, 4 mg at 06/14/20 1042 .  oxyCODONE (Oxy IR/ROXICODONE) immediate release tablet 15 mg, 15 mg, Oral, QID, Elgergawy, Silver Huguenin, MD, 15 mg at 06/14/20 0854 .  pantoprazole (PROTONIX) EC tablet 40 mg, 40 mg, Oral, BID, Elgergawy, Silver Huguenin, MD, 40 mg at 06/14/20 0854 .   potassium chloride SA (KLOR-CON) CR tablet 20 mEq, 20 mEq, Oral, BID, Elgergawy, Silver Huguenin, MD, 20 mEq at 06/14/20 0853 .  QUEtiapine (SEROQUEL) tablet 200 mg, 200 mg, Oral, QHS, Elgergawy, Silver Huguenin, MD, 200 mg at 06/13/20 2111 .  rOPINIRole (REQUIP) tablet 3 mg, 3 mg, Oral, QHS, Elgergawy, Silver Huguenin, MD, 3 mg at 06/13/20 2111 .  sodium chloride flush (NS) 0.9 % injection 3 mL, 3 mL, Intravenous, Q12H, Elgergawy, Silver Huguenin, MD, 3 mL at 06/14/20 0900 .  sodium chloride flush (NS) 0.9 % injection 3 mL, 3 mL, Intravenous, PRN, Elgergawy, Dawood S, MD .  sucralfate (CARAFATE) 1 GM/10ML suspension 1 g, 1 g, Oral, TID with meals, Shahmehdi, Seyed A, MD, 1 g at 06/14/20 0902 Allergies: Prozac [fluoxetine hcl], Zoloft [sertraline hcl], and Nicoderm [nicotine]  Complete Review of Systems: GENERAL: negative for malaise, night sweats HEENT: No changes in hearing or vision, no nose bleeds or other nasal problems. NECK: Negative for lumps, goiter, pain and significant neck swelling RESPIRATORY: Negative for cough, wheezing CARDIOVASCULAR: Negative for chest pain, leg swelling, palpitations, orthopnea GI: SEE HPI MUSCULOSKELETAL: Negative for joint pain or swelling, back pain, and muscle pain. SKIN: Negative for lesions, rash PSYCH: Negative for sleep disturbance, mood disorder and recent psychosocial stressors. HEMATOLOGY Negative for prolonged bleeding, bruising easily, and swollen nodes. ENDOCRINE: Negative for cold or heat intolerance, polyuria, polydipsia and goiter. NEURO: negative for tremor, gait imbalance, syncope and seizures. The remainder of the review of systems is noncontributory.  Physical Exam: BP (!) 139/93   Pulse 94   Temp 98.2 F (36.8 C)   Resp 18   Ht _0  (1.626 m)   Wt 68.7 kg   SpO2 90%   BMI 26.00 kg/m  GENERAL: The patient is AO x3, in no acute distress. Elder. HEENT: Head is normocephalic and atraumatic. EOMI are intact. Mouth is well hydrated and without  lesions. NECK: Supple. No masses LUNGS: Clear to auscultation. No presence of rhonchi/wheezing/rales. Adequate chest expansion HEART: RRR, normal s1 and s2. ABDOMEN: tender to palpation in the RUQ and epigastric area, no guarding, no peritoneal signs, and nondistended. BS +. No masses. EXTREMITIES: Without any cyanosis, clubbing, rash, lesions or edema. NEUROLOGIC: AOx3, no focal motor deficit. SKIN: no jaundice, no rashes  Laboratory Data CBC:     Component Value Date/Time   WBC 5.0 06/13/2020 0606   RBC 3.04 (L) 06/13/2020 0606   HGB 8.0 (L) 06/13/2020 0606   HCT 28.0 (L) 06/13/2020 0606   PLT 284 06/13/2020 0606   MCV 92.1 06/13/2020 0606   MCH 26.3 06/13/2020 0606   MCHC 28.6 (L) 06/13/2020 0606   RDW 17.8 (H) 06/13/2020 0606   LYMPHSABS 1.3 06/13/2020 0606   MONOABS 0.6 06/13/2020 0606   EOSABS 0.2 06/13/2020 0606   BASOSABS 0.0 06/13/2020 0606   COAG:  Lab Results  Component Value Date   INR 1.1 01/30/2020    BMP:  BMP Latest Ref Rng & Units 06/14/2020 06/13/2020 06/12/2020  Glucose 70 - 99 mg/dL 84 85 135(H)  BUN 8 - 23 mg/dL 16 23 27(H)  Creatinine 0.44 - 1.00 mg/dL 0.67 0.75 0.89  Sodium 135 - 145 mmol/L 141 142 137  Potassium 3.5 - 5.1 mmol/L 4.5 4.1 3.7  Chloride 98 - 111 mmol/L 99 106 103  CO2 22 - 32 mmol/L 34(H) 29 25  Calcium 8.9 - 10.3 mg/dL 8.2(L) 8.0(L) 8.2(L)    HEPATIC:  Hepatic Function Latest Ref Rng & Units 06/12/2020 01/30/2020 01/30/2020  Total Protein 6.5 - 8.1 g/dL 6.5 6.3(L) 6.6  Albumin 3.5 - 5.0 g/dL 3.1(L) 2.7(L) 2.7(L)  AST 15 - 41 U/L 31 8(L) 9(L)  ALT 0 - 44 U/L _0 Alk Phosphatase 38 - 126 U/L 109 87 88  Total Bilirubin 0.3 - 1.2 mg/dL 0.6 0.7 0.7    CARDIAC: No results found for: CKTOTAL, CKMB, CKMBINDEX, TROPONINI   Imaging: I personally reviewed and interpreted the available imaging.  Assessment & Plan: Briefly, this is a 80 year old female with past medical history of Roux-en-Y gastric bypass complicated by anastomotic  ulcer, hypertension, anxiety, chronic back pain, who came to the hospital after presenting worsening shortness of breath, found to have exacerbation of her heart failure.  She was also presenting new onset of nausea and vomiting along with abdominal pain in the epigastric area.  She had similar symptoms in the past and was found to have a large anastomotic ulcer at the jejunal side of her gastrojejunal anastomosis.  It is unclear if she has been compliant with her medication but it seems that she has been taking Celebrex since February.  Notably, she was found to have worsening anemia without any clinical symptoms of active gastrointestinal bleeding.  Due to this, we will need to check her iron stores, but will consider repeating an EGD to evaluate this area once her heart failure is better compensated.  We will keep her n.p.o. for possible EGD tomorrow if she is doing better.  She will need to continue with PPI twice daily and Carafate.  It would be very important for her to completely avoid cigarette smoking and to avoid any kind of NSAIDs including Celebrex.  Other potential etiologies for her symptoms include possible cholelithiasis, for which a liver ultrasound will be ordered today.  # Epigastric/RUQ abdominal pain # Nausea with vomiting # Anastomotic ulcer - c/w PPI BID PO (open capsule to swallow granules) - Carafate 1 g q6h - Repeat CBC qday, transfuse if Hb <8 - 2 large bore IV lines - Active T/S - Keep NPO after MN - Avoid NSAIDs and cigarette smoking - Will assess if OK to proceed with EGD tomorrow - RUQ [place] - Zofran as needed for nausea, can add Phenergan if refractory  Harvel Quale,  MD Gastroenterology and Hepatology [facility]

## 2020-06-15 ENCOUNTER — Inpatient Hospital Stay (HOSPITAL_COMMUNITY): Payer: Medicare Other

## 2020-06-15 DIAGNOSIS — R1013 Epigastric pain: Secondary | ICD-10-CM | POA: Diagnosis not present

## 2020-06-15 DIAGNOSIS — E785 Hyperlipidemia, unspecified: Secondary | ICD-10-CM

## 2020-06-15 DIAGNOSIS — R112 Nausea with vomiting, unspecified: Secondary | ICD-10-CM | POA: Diagnosis not present

## 2020-06-15 DIAGNOSIS — K219 Gastro-esophageal reflux disease without esophagitis: Secondary | ICD-10-CM | POA: Diagnosis not present

## 2020-06-15 DIAGNOSIS — R079 Chest pain, unspecified: Secondary | ICD-10-CM

## 2020-06-15 DIAGNOSIS — E782 Mixed hyperlipidemia: Secondary | ICD-10-CM | POA: Diagnosis not present

## 2020-06-15 DIAGNOSIS — J9601 Acute respiratory failure with hypoxia: Secondary | ICD-10-CM | POA: Diagnosis not present

## 2020-06-15 DIAGNOSIS — I5033 Acute on chronic diastolic (congestive) heart failure: Secondary | ICD-10-CM | POA: Diagnosis not present

## 2020-06-15 LAB — HEPATIC FUNCTION PANEL
ALT: 15 U/L (ref 0–44)
AST: 16 U/L (ref 15–41)
Albumin: 3 g/dL — ABNORMAL LOW (ref 3.5–5.0)
Alkaline Phosphatase: 99 U/L (ref 38–126)
Bilirubin, Direct: 0.1 mg/dL (ref 0.0–0.2)
Indirect Bilirubin: 0.5 mg/dL (ref 0.3–0.9)
Total Bilirubin: 0.6 mg/dL (ref 0.3–1.2)
Total Protein: 6.4 g/dL — ABNORMAL LOW (ref 6.5–8.1)

## 2020-06-15 LAB — CBC WITH DIFFERENTIAL/PLATELET
Abs Immature Granulocytes: 0.01 10*3/uL (ref 0.00–0.07)
Basophils Absolute: 0 10*3/uL (ref 0.0–0.1)
Basophils Relative: 1 %
Eosinophils Absolute: 0.2 10*3/uL (ref 0.0–0.5)
Eosinophils Relative: 3 %
HCT: 36.6 % (ref 36.0–46.0)
Hemoglobin: 10.3 g/dL — ABNORMAL LOW (ref 12.0–15.0)
Immature Granulocytes: 0 %
Lymphocytes Relative: 20 %
Lymphs Abs: 0.9 10*3/uL (ref 0.7–4.0)
MCH: 26.1 pg (ref 26.0–34.0)
MCHC: 28.1 g/dL — ABNORMAL LOW (ref 30.0–36.0)
MCV: 92.7 fL (ref 80.0–100.0)
Monocytes Absolute: 0.4 10*3/uL (ref 0.1–1.0)
Monocytes Relative: 9 %
Neutro Abs: 3.1 10*3/uL (ref 1.7–7.7)
Neutrophils Relative %: 67 %
Platelets: 310 10*3/uL (ref 150–400)
RBC: 3.95 MIL/uL (ref 3.87–5.11)
RDW: 17.3 % — ABNORMAL HIGH (ref 11.5–15.5)
WBC: 4.6 10*3/uL (ref 4.0–10.5)
nRBC: 0 % (ref 0.0–0.2)

## 2020-06-15 LAB — BRAIN NATRIURETIC PEPTIDE: B Natriuretic Peptide: 282 pg/mL — ABNORMAL HIGH (ref 0.0–100.0)

## 2020-06-15 LAB — BASIC METABOLIC PANEL
Anion gap: 8 (ref 5–15)
BUN: 18 mg/dL (ref 8–23)
CO2: 34 mmol/L — ABNORMAL HIGH (ref 22–32)
Calcium: 8.1 mg/dL — ABNORMAL LOW (ref 8.9–10.3)
Chloride: 95 mmol/L — ABNORMAL LOW (ref 98–111)
Creatinine, Ser: 0.85 mg/dL (ref 0.44–1.00)
GFR, Estimated: >60 mL/min (ref >60)
Glucose, Bld: 84 mg/dL (ref 70–99)
Potassium: 5.8 mmol/L — ABNORMAL HIGH (ref 3.5–5.1)
Sodium: 137 mmol/L (ref 135–145)

## 2020-06-15 LAB — IRON AND TIBC
Iron: 27 ug/dL — ABNORMAL LOW (ref 28–170)
Saturation Ratios: 7 % — ABNORMAL LOW (ref 10.4–31.8)
TIBC: 411 ug/dL (ref 250–450)
UIBC: 384 ug/dL

## 2020-06-15 LAB — FERRITIN: Ferritin: 12 ng/mL (ref 11–307)

## 2020-06-15 MED ORDER — ATORVASTATIN CALCIUM 20 MG PO TABS
20.0000 mg | ORAL_TABLET | Freq: Every day | ORAL | Status: DC
  Administered 2020-06-16 – 2020-06-23 (×4): 20 mg via ORAL
  Filled 2020-06-15 (×6): qty 1

## 2020-06-15 NOTE — Progress Notes (Signed)
SLP Cancellation Note  Patient Details Name: Staria Birkhead MRN: [id number] DOB: 1940/10/01   Cancelled treatment:       Reason Eval/Treat Not Completed: Other (comment) (Pt NPO pending EGD; SLP will check back)  Thank you,  Havery Moros, CCC-SLP [phone]   Maicol Bowland 06/15/2020, 8:42 AM

## 2020-06-15 NOTE — TOC Initial Note (Signed)
Transition of Care [facility]) - Initial/Assessment Note    Patient Details  Name: Brenda Hopkins MRN: [id number] Date of Birth: Feb 21, 1941  [facility] of [facility]) CM/SW Contact:    Annice Needy, LCSW Phone Number: 06/15/2020, 5:28 PM  Clinical Narrative:                 Patient from home alone. Uses a walker at baseline. Does not drive, daughter transports to appointments. Patient has wheelchair and shower chair. Wants to discharge home.  Consulted for heart failure home screen. Appears to be new dx of CHF. Patient does not currently take daily weights and does not follow heart healthy diet. Advises that she has not been told to do so.  Patient no longer receives Meal on Wheels as she stopped them due to meal being frozen and her inability to cook them due to mobility difficulties.  Daughter is supportive.  TOC will follow through d/c to address needs.   Expected Discharge Plan: Home w Home Health Services Barriers to Discharge: Continued Medical Work up   Patient Goals and CMS Choice Patient states their goals for this hospitalization and ongoing recovery are:: return home      Expected Discharge Plan and Services Expected Discharge Plan: Home w Home Health Services       Living arrangements for the past 2 months: Single Family Home                                      Prior Living Arrangements/Services Living arrangements for the past 2 months: Single Family Home Lives with:: Self Patient language and need for interpreter reviewed:: Yes Do you feel safe going back to the place where you live?: Yes      Need for Family Participation in Patient Care: Yes (Comment) Care giver support system in place?: Yes (comment) Current home services: DME Criminal Activity/Legal Involvement Pertinent to Current Situation/Hospitalization: No - Comment as needed  Activities of Daily Living Home Assistive Devices/Equipment: None ADL Screening (condition at time of  admission) Patient's cognitive ability adequate to safely complete daily activities?: Yes Is the patient deaf or have difficulty hearing?: No Does the patient have difficulty seeing, even when wearing glasses/contacts?: Yes Does the patient have difficulty concentrating, remembering, or making decisions?: No Patient able to express need for assistance with ADLs?: Yes Does the patient have difficulty dressing or bathing?: No Independently performs ADLs?: Yes (appropriate for developmental age) Does the patient have difficulty walking or climbing stairs?: Yes Weakness of Legs: Both Weakness of Arms/Hands: None  Permission Sought/Granted                  Emotional Assessment     Affect (typically observed): Appropriate Orientation: : Oriented to Self,Oriented to Place,Oriented to  Time,Oriented to Situation Alcohol / Substance Use: Not Applicable Psych Involvement: No (comment)  Admission diagnosis:  Hypoxia [R09.02] Acute on chronic diastolic CHF (congestive heart failure) (HCC) [I50.33] Acute heart failure, unspecified heart failure type (HCC) [I50.9] Patient Active Problem List   Diagnosis Date Noted  . Acute on chronic diastolic CHF (congestive heart failure) (HCC) 06/12/2020  . Gi Tract---Marginal ulcer 01/30/2020  . Noncompliance with medication regimen 01/30/2020  . AKI (acute kidney injury) (HCC) 01/30/2020  . Dehydration 01/30/2020  . Hypoalbuminemia 01/30/2020  . Obesity (BMI 30.0-34.9) 01/30/2020  . Nausea 01/30/2020  . Acute respiratory failure with hypoxia (HCC) 01/30/2020  . Epigastric  pain 01/16/2020  . Ventral hernia without obstruction or gangrene   . Intractable epigastric abdominal pain 01/15/2020  . Insomnia 05/13/2014  . Generalized anxiety disorder 07/15/2013  . Hypertension   . Chronic back pain   . Hyperlipidemia   . GERD (gastroesophageal reflux disease)   . Hiatal hernia   . Smoker   . Pelvic fracture (HCC) 12/12/2012   PCP:  Shawnie Dapper, PA-C Pharmacy:   [facility] [phone] - [place], [place] - [address]  [place] [address] Phone: [phone] Fax: [phone]     Social Determinants of Health (SDOH) Interventions    Readmission Risk Interventions No flowsheet data found.

## 2020-06-15 NOTE — Evaluation (Signed)
Clinical/Bedside Swallow Evaluation Patient Details  Name: Brenda Hopkins MRN: [id number] Date of Birth: [birth year]  Today's Date: 06/15/2020 Time: SLP Start Time (ACUTE ONLY): 1622 SLP Stop Time (ACUTE ONLY): 1651 SLP Time Calculation (min) (ACUTE ONLY): 29 min  Past Medical History:  Past Medical History:  Diagnosis Date  . Chronic back pain   . GERD (gastroesophageal reflux disease)   . Hiatal hernia   . Hyperlipidemia   . Hypertension   . Pelvic fracture (HCC) 12/12/2012  . Smoker    Past Surgical History:  Past Surgical History:  Procedure Laterality Date  . ABDOMINAL HYSTERECTOMY    . APPENDECTOMY    . ESOPHAGOGASTRODUODENOSCOPY (EGD) WITH PROPOFOL N/A 01/17/2020   Procedure: ESOPHAGOGASTRODUODENOSCOPY (EGD) WITH PROPOFOL;  Surgeon: Dolores Frame, MD;  [facility];  Service: Gastroenterology;  Laterality: N/A;  . 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 HEMORROIDECTOMY    . LUMBAR SPINE SURGERY    . OOPHORECTOMY    . TONSILLECTOMY     HPI:  Brenda Hopkins  is a 80 y.o. female, with medical history significant for hypertension, anxiety, marginal ulcer, tobacco abuse and chronic pain due to complaints of shortness of breath, reports symptoms going on for last week, reports intense with exertion, and laying flat, as well she does report some epigastric/lower chest pain, does not radiate. She is informed that we have ruled out cardiac this must be GI in nature, Planning for EGD tomorrow. Pt is on a clear liquid diet but will be NPO tonight for EGD planned for tomorrow. SLP requested and granted permission to assess with solids for BSE.   Assessment / Plan / Recommendation Clinical Impression  Clinical swallowing evaluation completed while Pt was sitting upright in bed. Pt assessed with thin liquids, regular textures and jell-o without overt s/sx of aspiration. Pt's daughter was present for evaluation. Pt denies swallowing difficulty with the exception of  occasionally getting "choked" on things like cornbread. Pt with known hx of esophageal issues. Pt's presentation and report of symptoms (globus sensation, belching, regurgitation) are likely esophageal in nature; there are no s/sx of oropharyngeal dysphagia noted today. Recommend initiate D3/mech soft diet and thin liquids with meds to be administered whole with liquids, however defer to MD and/or GI to initiate solid diet when medically appropriate. There are no further ST needs noted at this time, ST will sign off. Thank you, SLP Visit Diagnosis: Dysphagia, unspecified (R13.10)    Aspiration Risk  Mild aspiration risk    Diet Recommendation Dysphagia 3 (Mech soft);Thin liquid   Liquid Administration via: Cup;Straw Medication Administration: Whole meds with liquid Supervision: Patient able to self feed;Intermittent supervision to cue for compensatory strategies Compensations: Minimize environmental distractions;Slow rate;Small sips/bites;Follow solids with liquid Postural Changes: Seated upright at 90 degrees;Remain upright for at least 30 minutes after po intake    Other  Recommendations Oral Care Recommendations: Oral care BID   Follow up Recommendations   none       Swallow Study   General HPI: Brenda Hopkins  is a 80 y.o. female, with medical history significant for hypertension, anxiety, marginal ulcer, tobacco abuse and chronic pain due to complaints of shortness of breath, reports symptoms going on for last week, reports intense with exertion, and laying flat, as well she does report some epigastric/lower chest pain, does not radiate. She is informed that we have ruled out cardiac this must be GI in nature, Planning for EGD tomorrow. Pt is on a clear liquid diet but will  be NPO tonight for EGD planned for tomorrow. SLP requested and granted permission to assess with solids for BSE. Type of Study: Bedside Swallow Evaluation Previous Swallow Assessment: none in chart Diet Prior to this  Study: Thin liquids Temperature Spikes Noted: No History of Recent Intubation: No Behavior/Cognition: Alert;Cooperative;Pleasant mood Oral Cavity Assessment: Within Functional Limits Oral Cavity - Dentition: Edentulous;Dentures, not available Vision: Functional for self-feeding Self-Feeding Abilities: Able to feed self;Needs set up Patient Positioning: Upright in bed Baseline Vocal Quality: Normal Volitional Cough: Strong Volitional Swallow: Able to elicit    Oral/Motor/Sensory Function Overall Oral Motor/Sensory Function: Within functional limits   Ice Chips Ice chips: Within functional limits   Thin Liquid Thin Liquid: Within functional limits    Nectar Thick Nectar Thick Liquid: Not tested   Honey Thick Honey Thick Liquid: Not tested   Puree Puree: Within functional limits   Solid     Solid: Within functional limits     Brenda Poteat H. Romie Levee, CCC-SLP Speech Language Pathologist  Brenda Hopkins 06/15/2020,5:22 PM

## 2020-06-15 NOTE — Progress Notes (Addendum)
PROGRESS NOTE    Patient: Brenda Hopkins                            PCP: Samuella Bruin                    DOB: Sep 16, 1940            DOA: 06/12/2020 ZOX:[id number]             DOS: 06/15/2020, 10:42 AM   LOS: 3 days   Date of Service: The patient was seen and examined on 06/15/2020  Subjective:   The patient was seen and examined this morning, nursing staff present at bedside, once again complaining of epigastric pain.... That is yesterday morning she has been on clear liquid diet. She is informed that we have ruled out cardiac this must be GI in nature.. She reported that she has had ulcers in the past.  N.p.o. awaiting GI evaluation for possible EGD  Brief Narrative:   Brenda Hopkins  is a 80 y.o. female, with medical history significant forhypertension, anxiety, marginalulcer, tobacco abuseandchronic pain due to complaints of shortness of breath, reports symptoms going on for last week, reports intense with exertion, and laying flat, as well she does report some epigastric/lower chest pain, does not radiate, as well she does report worsening lower extremity edema, denies fever, chills, cough, nausea or vomiting,.  she denies any dysuria or polyuria, reports she was recently seen by her PCP for lower extremity edema which she was started on Lasix for last 4 days, report was told to have A. fib, and referral has been made to cardiologist but she did not get to see them yet, as well she does report history of COPD, but she denies any cough, productive sputum or wheezing.  -Was significant for BNP of 1270, chest x-ray significant for vascular congestion, EKG nonacute, troponins non-ACS pattern 71>63, was given IV Lasix, and [facility] hospitalist consulted to admit for CHF.   Assessment & Plan:   Active Problems:   Hypertension   Hyperlipidemia   GERD (gastroesophageal reflux disease)   Acute respiratory failure with hypoxia (HCC)   Acute on chronic diastolic CHF (congestive  heart failure) (HCC)  Epigastric chest pain -Once again having pain this morning, mainly in epigastric area -She has been on clear liquid diet, and n.p.o. this morning -Stating not as bad as yesterday... Did not vomit this morning -Post prandial with every meal developed gastric pain then followed with nausea vomiting -No relief from Protonix, Carafate, Maalox -Consulting gastroenterology >>> appreciate input, anticipating EGD today or tomorrow -Ultrasound right upper quadrant negative for cholelithiasis or cholecystitis  Chest pain -Mostly epigastric, ACS has been ruled out -Repeating EKGs within normal limits, -Atypical she had some reproducible epigastric pain on palpation,  - EKG nonacute, ACS ruled out, troponin 71> 63 >> 43, 28, 29 -As needed nitroglycerin aspirin oxygen   Acute on chronic diastolic CHF -Stable, improved, on 2 L of oxygen satting 90% -BNP 1270 >>> --5.8 L since admission -Continue IV Lasix -Troponin 71, 63, 43, 28, 29 -elevated BNP, vascular congestion on imaging, worsening lower extremity edema. -Continue Lasix 40 mg daily weight, I's and O's -2D echocardiogram >> reviewed ejection fraction 55-60%, diastolic congestive heart failure grade 1 -Considering consulting cardiology -She is admitted under CHF pathway,  on IV Lasix, daily weights, strict ins and outs. -Cardiology following  -CTA: Reviewed-pulmonary embolism, cardiomegaly, severe coronary atherosclerosis,  aortic atherosclerosis,.. Bilateral sublingual atelectasis, T9 vertebral compression deformity   ?Atrial fibrillation  -Reported possible A. fib as an outpatient -She has been normal sinus rhythm, tachycardia at times, all EKGs reviewed during hospital course and monitor... No atrial fibrillation or flutter was observedt -Reported by PCP, current EKG normal sinus rhythm -TSH normal at 1.24. -We will continue to monitor, remained to be in normal sinus rhythm  COPD-No active wheezing, no with  home medications... Remains on 2 L of oxygen 90%  Possible [facility] pneumonia/bronchitis -Blood cultures been obtained, continue current IV antibiotics (Rocephin/azithromycin)  Hyperlipidemia -Continue with statin  Hypertension -Continue with Norvasc and lisinopril  Chronic pain syndrome -Continue with home medications    ---------------------------------------------------------------------------------------------------------------------------------------- Nutritional status:  The patient's BMI is: Body mass index is 25.77 kg/m. I agree with the assessment and plan as outlined ---------------------------------------------------------------------------------------------------------------------------------------- Cultures; Blood cultures x2  Antimicrobials: IV Rocephin/azithromycin  Consultants: Cardiology/gastroenterologist  ---------------------------------------------------------------------------------------------------------------------------------------- DVT prophylaxis:  SCD/Compression stockings and Lovenox SQ Code Status:   Code Status: Full Code  Family Communication: Updated her daughter Ms. Caman [phone] The above findings and plan of care has been discussed with patient  in detail,  they expressed understanding and agreement of above. -Advance care planning has been discussed.   Admission status:   Status is: Inpatient  Remains inpatient appropriate because:Hemodynamically unstable and Inpatient level of care appropriate due to severity of illness   Dispo: The patient is from: Home              Anticipated d/c is to: Home              Patient currently is not medically stable to d/c.   Difficult to place patient No     Level of care: Telemetry   Procedures:   No admission procedures for hospital encounter.    Antimicrobials:  Anti-infectives (From admission, onward)   Start     Dose/Rate Route Frequency Ordered Stop   06/12/20 1500   cefTRIAXone (ROCEPHIN) 1 g in sodium chloride 0.9 % 100 mL IVPB        1 g 200 mL/hr over 30 Minutes Intravenous  Once 06/12/20 1454 06/12/20 1756   06/12/20 1500  azithromycin (ZITHROMAX) 500 mg in sodium chloride 0.9 % 250 mL IVPB        500 mg 250 mL/hr over 60 Minutes Intravenous  Once 06/12/20 1454 06/12/20 1904       Medication:  . amLODipine  10 mg Oral Daily  . [START ON 06/16/2020] atorvastatin  20 mg Oral QHS  . enoxaparin (LOVENOX) injection  40 mg Subcutaneous Q24H  . furosemide  40 mg Intravenous BID  . gabapentin  600 mg Oral TID  . lisinopril  10 mg Oral Daily  . pantoprazole  40 mg Oral BID  . QUEtiapine  200 mg Oral QHS  . rOPINIRole  3 mg Oral QHS  . sodium chloride flush  3 mL Intravenous Q12H  . sucralfate  1 g Oral TID with meals    sodium chloride, acetaminophen, alum & mag hydroxide-simeth, nitroGLYCERIN, ondansetron (ZOFRAN) IV, oxyCODONE, sodium chloride flush   Objective:   Vitals:   06/14/20 1313 06/14/20 2042 06/15/20 0441 06/15/20 0600  BP: (!) 103/59 (!) 110/100 106/67   Pulse: 84 91 86   Resp: 18 16 14    Temp: 97.7 F (36.5 C) 98.8 F (37.1 C) 98.2 F (36.8 C)   TempSrc: Oral Oral Oral   SpO2: 95% 92% 97%   Weight:    68.1  kg  Height:        Intake/Output Summary (Last 24 hours) at 06/15/2020 1042 Last data filed at 06/15/2020 0830 Gross per 24 hour  Intake 940 ml  Output 3255 ml  Net -2315 ml   Filed Weights   06/13/20 0030 06/14/20 0448 06/15/20 0600  Weight: 71.2 kg 68.7 kg 68.1 kg     Examination:      Physical Exam:   General:   80 year old elderly female in discomfort with epigastric pain alert,  otherwise awake following commands  HEENT:  Normocephalic, PERRL, otherwise with in Normal limits   Neuro:  CNII-XII intact. , normal motor and sensation, reflexes intact   Lungs:   Clear to auscultation BL, Respirations unlabored, no wheezes / crackles  Cardio:    S1/S2, RRR, No murmure, No Rubs or Gallops   Abdomen:   Soft,  non-tender, bowel sounds active all four quadrants,  no guarding or peritoneal signs.  Muscular skeletal:   Generalized weaknesses, Limited exam - in bed, able to move all 4 extremities, Normal strength,  2+ pulses,  symmetric, No pitting edema  Skin:  Dry, warm to touch, negative for any Rashes,  Wounds: Please see nursing documentation            ------------------------------------------------------------------------------------------------------------------------------------------    LABs:  CBC Latest Ref Rng & Units 06/13/2020 06/12/2020 01/31/2020  WBC 4.0 - 10.5 K/uL 5.0 8.5 6.5  Hemoglobin 12.0 - 15.0 g/dL 8.0(L) 9.1(L) 11.1(L)  Hematocrit 36.0 - 46.0 % 28.0(L) 31.8(L) 37.0  Platelets 150 - 400 K/uL 284 366 496(H)   CMP Latest Ref Rng & Units 06/15/2020 06/14/2020 06/13/2020  Glucose 70 - 99 mg/dL 84 84 85  BUN 8 - 23 mg/dL 18 16 23   Creatinine 0.44 - 1.00 mg/dL 7.16 9.67  Sodium 135 - 145 mmol/L 137 141 142  Potassium 3.5 - 5.1 mmol/L 5.8(H) 4.5 4.1  Chloride 98 - 111 mmol/L 95(L) 99 106  CO2 22 - 32 mmol/L 34(H) 34(H) 29  Calcium 8.9 - 10.3 mg/dL 8.1(L) 8.2(L) 8.0(L)  Total Protein 6.5 - 8.1 g/dL - - -  Total Bilirubin 0.3 - 1.2 mg/dL - - -  Alkaline Phos 38 - 126 U/L - - -  AST 15 - 41 U/L - - -  ALT 0 - 44 U/L - - -       Micro Results Recent Results (from the past 240 hour(s))  Blood culture (routine x 2)     Status: None (Preliminary result)   Collection Time: 06/12/20  3:05 PM   Specimen: BLOOD LEFT HAND  Result Value Ref Range Status   Specimen Description BLOOD LEFT HAND  Final   Special Requests   Final    Blood Culture adequate volume BOTTLES DRAWN AEROBIC AND ANAEROBIC   Culture   Final    NO GROWTH 3 DAYS Performed at [facility], [address] [place], [place]    Report Status PENDING  Incomplete  Blood culture (routine x 2)     Status: None (Preliminary result)   Collection Time: 06/12/20  3:05 PM   Specimen: BLOOD RIGHT HAND   Result Value Ref Range Status   Specimen Description BLOOD RIGHT HAND  Final   Special Requests   Final    Blood Culture results may not be optimal due to an inadequate volume of blood received in culture bottles BOTTLES DRAWN AEROBIC AND ANAEROBIC   Culture   Final    NO GROWTH 3 DAYS Performed  at [facility], [address], [place], [place] [address]    Report Status PENDING  Incomplete  Resp Panel by RT-PCR (Flu A&B, Covid) Nasopharyngeal Swab     Status: None   Collection Time: 06/12/20  3:51 PM   Specimen: Nasopharyngeal Swab; Nasopharyngeal(NP) swabs in vial transport medium  Result Value Ref Range Status   SARS Coronavirus 2 by RT PCR NEGATIVE NEGATIVE Final    Comment: (NOTE) SARS-CoV-2 target nucleic acids are NOT DETECTED.  The SARS-CoV-2 RNA is generally detectable in upper respiratory specimens during the acute phase of infection. The lowest concentration of SARS-CoV-2 viral copies this assay can detect is 138 copies/mL. A negative result does not preclude SARS-Cov-2 infection and should not be used as the sole basis for treatment or other patient management decisions. A negative result may occur with  improper specimen collection/handling, submission of specimen other than nasopharyngeal swab, presence of viral mutation(s) within the areas targeted by this assay, and inadequate number of viral copies(<138 copies/mL). A negative result must be combined with clinical observations, patient history, and epidemiological information. The expected result is Negative.  Fact Sheet for Patients:  BloggerCourse.com  Fact Sheet for Healthcare Providers:  SeriousBroker.it  This test is no t yet approved or cleared by the [place] FDA and  has been authorized for detection and/or diagnosis of SARS-CoV-2 by FDA under an Emergency Use Authorization (EUA). This EUA will remain  in effect (meaning this test can be used) for  the duration of the COVID-19 declaration under Section 564(b)(1) of the Act, 21 U.S.C.section 360bbb-3(b)(1), unless the authorization is terminated  or revoked sooner.       Influenza A by PCR NEGATIVE NEGATIVE Final   Influenza B by PCR NEGATIVE NEGATIVE Final    Comment: (NOTE) The Xpert Xpress SARS-CoV-2/FLU/RSV plus assay is intended as an aid in the diagnosis of influenza from Nasopharyngeal swab specimens and should not be used as a sole basis for treatment. Nasal washings and aspirates are unacceptable for Xpert Xpress SARS-CoV-2/FLU/RSV testing.  Fact Sheet for Patients: BloggerCourse.com  Fact Sheet for Healthcare Providers: SeriousBroker.it  This test is not yet approved or cleared by the [place] FDA and has been authorized for detection and/or diagnosis of SARS-CoV-2 by FDA under an Emergency Use Authorization (EUA). This EUA will remain in effect (meaning this test can be used) for the duration of the COVID-19 declaration under Section 564(b)(1) of the Act, 21 U.S.C. section 360bbb-3(b)(1), unless the authorization is terminated or revoked.  Performed at [facility], [address] [place], [place] [address]     Radiology Reports DG Chest 1 View  Result Date: 06/12/2020 CLINICAL DATA:  Shortness of breath EXAM: CHEST  1 VIEW COMPARISON:  January 15, 2020 FINDINGS: There is ill-defined airspace opacity in the right upper lobe. There is scarring in the left base. The heart is enlarged with pulmonary vascularity normal, stable. There is aortic atherosclerosis. Bones are osteoporotic. There is extensive arthropathy in the right shoulder. IMPRESSION: Ill-defined airspace opacity consistent with pneumonia right upper lobe. Scarring left base. Stable cardiac enlargement. Bones osteoporotic. Aortic Atherosclerosis (ICD10-I70.0). Electronically Signed   By: Bretta Bang III M.D.   On: 06/12/2020 14:47   CT Angio  Chest PE W and/or Wo Contrast  Result Date: 06/12/2020 CLINICAL DATA:  80 year old female with shortness of breath. EXAM: CT ANGIOGRAPHY CHEST WITH CONTRAST TECHNIQUE: Multidetector CT imaging of the chest was performed using the standard protocol during bolus administration of intravenous contrast. Multiplanar CT  image reconstructions and MIPs were obtained to evaluate the vascular anatomy. CONTRAST:  Seventy-five mL Omnipaque 350, intravenous COMPARISON:  01/04/2020 FINDINGS: Cardiovascular: Satisfactory opacification of the pulmonary arteries to the segmental level. No evidence of pulmonary embolism. Similar appearing moderate global cardiomegaly. Mitral annular calcifications. Severe coronary atherosclerotic calcifications. Scattered atherosclerotic calcifications of the thoracic aorta. No pericardial effusion. Mediastinum/Nodes: No enlarged mediastinal, hilar, or axillary lymph nodes. The esophagus is patulous throughout with a standing column of enteric contents. Thyroid gland and trachea demonstrate no significant findings. Lungs/Pleura: Bibasilar and lingular subsegmental atelectasis. Mild upper lobe predominant centrilobular emphysema. No suspicious pulmonary nodules. No pleural effusion or pneumothorax. 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 The visualized upper abdomen is otherwise within normal limits. Musculoskeletal: Progressive compression deformity of the T9 vertebral body with associated kyphosis at this level, now nearly vertebra plana morphology. Mild, unchanged retropulsion. 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 Multilevel chronic, bilateral posterolateral nondisplaced rib fractures. Advanced degenerative changes of the glenohumeral joints bilaterally. No acute osseous abnormality. Review of the MIP images confirms the above findings. IMPRESSION: Vascular: 1. No evidence of pulmonary  embolism. 2. Unchanged moderate global cardiomegaly. 3. Severe coronary atherosclerotic calcifications. 4.  Aortic Atherosclerosis (ICD10-I70.0). Non-Vascular: 1. Bibasilar and lingular subsegmental atelectasis. 2. Progressive compression deformity of the T9 vertebral body, now essentially vertebra plana. Unchanged mild retropulsion. Marliss Coots, MD [facility] Radiology Electronically Signed   By: Marliss Coots MD   On: 06/12/2020 16:45   US Venous Img Lower  Left (DVT Study)  Result Date: 06/12/2020 CLINICAL DATA:  Lower extremity pain and edema EXAM: LEFT LOWER EXTREMITY VENOUS DUPLEX ULTRASOUND TECHNIQUE: Gray-scale sonography with graded compression, as well as color Doppler and duplex ultrasound were performed to evaluate the left lower extremity deep venous system from the level of the common femoral vein and including the common femoral, femoral, profunda femoral, popliteal and calf veins including the posterior tibial, peroneal and gastrocnemius veins when visible. The superficial great saphenous vein was also interrogated. Spectral Doppler was utilized to evaluate flow at rest and with distal augmentation maneuvers in the common femoral, femoral and popliteal veins. COMPARISON:  None. FINDINGS: Contralateral Common Femoral Vein: Respiratory phasicity is normal and symmetric with the symptomatic side. No evidence of thrombus. Normal compressibility. Common Femoral Vein: No evidence of thrombus. Normal compressibility, respiratory phasicity and response to augmentation. Saphenofemoral Junction: No evidence of thrombus. Normal compressibility and flow on color Doppler imaging. Profunda Femoral Vein: No evidence of thrombus. Normal compressibility and flow on color Doppler imaging. Femoral Vein: No evidence of thrombus. Normal compressibility, respiratory phasicity and response to augmentation. Popliteal Vein: No evidence of thrombus. Normal compressibility,  respiratory phasicity and response to augmentation. Calf Veins: No evidence of thrombus. Normal compressibility and flow on color Doppler imaging. Superficial Great Saphenous Vein: No evidence of thrombus. Normal compressibility. Venous Reflux:  None. Other Findings:  Soft tissue edema noted in the calf region. IMPRESSION: No evidence of deep venous thrombosis in the left lower extremity. Right common femoral vein patent. There is left calf region soft tissue edema. Electronically Signed   By: Bretta Bang III M.D.   On: 06/12/2020 14:46   DG CHEST PORT 1 VIEW  Result Date: 06/15/2020 CLINICAL DATA:  Shortness of breath, hypertension, former smoker EXAM: PORTABLE CHEST 1 VIEW COMPARISON:  Portable exam 0656 hours compared to 06/12/2020 FINDINGS: Enlargement of cardiac silhouette. Mediastinal contours and pulmonary vascularity normal. Atherosclerotic  calcification aorta. Bibasilar atelectasis. No acute infiltrate, pleural effusion, or pneumothorax. Bones demineralized with advanced degenerative changes of the RIGHT glenohumeral joint. IMPRESSION: Enlargement of cardiac silhouette with bibasilar atelectasis. Aortic Atherosclerosis (ICD10-I70.0). Electronically Signed   By: Ulyses Southward M.D.   On: 06/15/2020 08:06   ECHOCARDIOGRAM COMPLETE  Result Date: 06/13/2020    ECHOCARDIOGRAM REPORT   Patient Name:   Brenda Hopkins Date of Exam: 06/13/2020 Medical Rec #:  [id number]           Height:       64.0 in Accession #:    [id number]          Weight:       157.0 lb Date of Birth:  Aug 13, 1940           BSA:          1.765 m Patient Age:    79 years            BP:           108/55 mmHg Patient Gender: F                   HR:           91 bpm. [facility] Procedure: 2D Echo Indications:    CHF-Acute Diastolic I50.31  History:        Patient has prior history of Echocardiogram examinations, most                 recent 01/16/2020. Risk Factors:Hypertension, Dyslipidemia and                 Former  Smoker.  Sonographer:    Jeryl Columbia RDCS (AE) Referring Phys: [id number] [address] IMPRESSIONS  1. Left ventricular ejection fraction, by estimation, is 55 to 60%. The left ventricle has normal function. The left ventricle has no regional wall motion abnormalities. There is mild concentric left ventricular hypertrophy of the basal-septal segment. Left ventricular diastolic parameters are consistent with Grade I diastolic dysfunction (impaired relaxation).  2. Right ventricular systolic function is normal. The right ventricular size is normal. There is mildly elevated pulmonary artery systolic pressure. The estimated right ventricular systolic pressure is 42.0 mmHg.  3. The mitral valve is grossly normal. No evidence of mitral valve regurgitation. No evidence of mitral stenosis. Severe mitral annular calcification.  4. The aortic valve was not well visualized. There is mild calcification of the aortic valve. Aortic valve regurgitation is not visualized. No aortic stenosis is present.  5. The inferior vena cava is dilated in size with <50% respiratory variability, suggesting right atrial pressure of 15 mmHg. Comparison(s): A prior study was performed on 01/16/2020. Left ventricle is less hyperdynamic. FINDINGS  Left Ventricle: Left ventricular ejection fraction, by estimation, is 55 to 60%. The left ventricle has normal function. The left ventricle has no regional wall motion abnormalities. The left ventricular internal cavity size was normal in size. There is  mild concentric left ventricular hypertrophy of the basal-septal segment. Left ventricular diastolic parameters are consistent with Grade I diastolic dysfunction (impaired relaxation). Right Ventricle: The right ventricular size is normal. No increase in right ventricular wall thickness. Right ventricular systolic function is normal. There is mildly elevated pulmonary artery systolic pressure. The tricuspid regurgitant velocity is 2.60  m/s, and with an  assumed right atrial pressure of 15 mmHg, the estimated right ventricular systolic pressure is 42.0 mmHg. Left Atrium: Left atrial size was normal in size. Right  Atrium: Right atrial size was normal in size. Pericardium: There is no evidence of pericardial effusion. Mitral Valve: The mitral valve is grossly normal. There is mild thickening of the mitral valve leaflet(s). There is moderate calcification of the mitral valve leaflet(s). Severe mitral annular calcification. No evidence of mitral valve regurgitation. No evidence of mitral valve stenosis. MV peak gradient, 6.7 mmHg. The mean mitral valve gradient is 3.0 mmHg with average heart rate of 98 bpm. Tricuspid Valve: The tricuspid valve is grossly normal. Tricuspid valve regurgitation is trivial. No evidence of tricuspid stenosis. Aortic Valve: The aortic valve was not well visualized. There is mild calcification of the aortic valve. Aortic valve regurgitation is not visualized. No aortic stenosis is present. Pulmonic Valve: The pulmonic valve was not well visualized. Pulmonic valve regurgitation is not visualized. No evidence of pulmonic stenosis. Aorta: The aortic root is normal in size and structure. Venous: The inferior vena cava is dilated in size with less than 50% respiratory variability, suggesting right atrial pressure of 15 mmHg. IAS/Shunts: The atrial septum is grossly normal.  LEFT VENTRICLE PLAX 2D LVIDd:         4.18 cm  Diastology LVIDs:         2.99 cm  LV e' medial:    5.00 cm/s LV PW:         1.40 cm  LV E/e' medial:  18.3 LV IVS:        1.35 cm  LV e' lateral:   4.24 cm/s LVOT diam:     2.10 cm  LV E/e' lateral: 21.5 LVOT Area:     3.46 cm  RIGHT VENTRICLE RV S prime:     14.30 cm/s TAPSE (M-mode): 2.4 cm LEFT ATRIUM             Index       RIGHT ATRIUM           Index LA diam:        4.30 cm 2.44 cm/m  RA Area:     14.40 cm LA Vol (A2C):   52.3 ml 29.63 ml/m RA Volume:   35.60 ml  20.17 ml/m LA Vol (A4C):   48.3 ml 27.37 ml/m LA Biplane  Vol: 52.1 ml 29.52 ml/m   AORTA Ao Root diam: 2.50 cm MITRAL VALVE                TRICUSPID VALVE MV Area (PHT): 3.36 cm     TR Peak grad:   27.0 mmHg MV Peak grad:  6.7 mmHg     TR Vmax:        260.00 cm/s MV Mean grad:  3.0 mmHg MV Vmax:       1.29 m/s     SHUNTS MV Vmean:      87.5 cm/s    Systemic Diam: 2.10 cm MV Decel Time: 226 msec MV E velocity: 91.30 cm/s MV A velocity: 120.00 cm/s MV E/A ratio:  0.76 Riley Lam MD Electronically signed by Riley Lam MD Signature Date/Time: 06/13/2020/3:04:50 PM    Final    US Abdomen Limited RUQ (LIVER/GB)  Result Date: 06/15/2020 CLINICAL DATA:  Diffuse abdominal pain. 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 EXAM: ULTRASOUND ABDOMEN LIMITED RIGHT UPPER QUADRANT COMPARISON:  Prior CT scan November 2021 FINDINGS: Gallbladder: No gallstones or wall thickening visualized. No sonographic Murphy sign noted by sonographer. Common bile duct: Diameter: Within normal limits at 5-6 mm Liver: No focal lesion identified. Within normal limits in  parenchymal echogenicity. Portal vein is patent on color Doppler imaging with normal direction of blood flow towards the liver. Other: None. IMPRESSION: Negative right upper quadrant ultrasound. Electronically Signed   By: Malachy Moan M.D.   On: 06/15/2020 09:55    SIGNED: Kendell Bane, MD, FHM. [facility],  Pager (please use [email or web address] to page/text) Please use Epic Secure Chat for non-urgent communication (7AM-7PM)  If 7PM-7AM, please contact night-coverage [email or web address], 06/15/2020, 10:42 AM

## 2020-06-15 NOTE — Progress Notes (Signed)
Subjective: Notes chronic abdominal pain. No N/V. No diarrhea. Still feeling short of breath. Not on oxygen at home. Difficult historian.   Objective: Vital signs in last 24 hours: Temp:  [97.7 F (36.5 C)-98.8 F (37.1 C)] 98.2 F (36.8 C) (04/04 0441) Pulse Rate:  [84-91] 86 (04/04 0441) Resp:  [14-18] 14 (04/04 0441) BP: (103-110)/(59-100) 106/67 (04/04 0441) SpO2:  [92 %-97 %] 97 % (04/04 0441) Weight:  [68.1 kg] 68.1 kg (04/04 0600) Last BM Date: 06/12/20 General:   Alert and oriented to person, irritable Head:  Normocephalic and atraumatic. Abdomen:  Bowel sounds present, round but soft, markedly TTP RUQ, TTP epigastric, mild TTP lower abdomen, moderate sized umbilical hernia Msk:  Symmetrical without gross deformities. Normal posture. Neurologic:  Alert and  oriented x3 Psych:  Flat affect  Intake/Output from previous day: 04/03 0701 - 04/04 0700 In: 1300 [P.O.:1300] Out: 2605 [Urine:2605] Intake/Output this shift: No intake/output data recorded.  Lab Results: Recent Labs    06/12/20 1413 06/13/20 0606  WBC 8.5 5.0  HGB 9.1* 8.0*  HCT 31.8* 28.0*  PLT 366 284   BMET Recent Labs    06/13/20 0606 06/14/20 0553 06/15/20 0441  NA 142 141 137  K 4.1 4.5 5.8*  CL 106 99 95*  CO2 29 34* 34*  GLUCOSE 85 84 84  BUN 23 16 18   CREATININE 0.75 0.67 0.85  CALCIUM 8.0* 8.2* 8.1*   LFT Recent Labs    06/12/20 1413  PROT 6.5  ALBUMIN 3.1*  AST 31  ALT 24  ALKPHOS 109  BILITOT 0.6    Studies/Results: DG CHEST PORT 1 VIEW  Result Date: 06/15/2020 CLINICAL DATA:  Shortness of breath, hypertension, former smoker EXAM: PORTABLE CHEST 1 VIEW COMPARISON:  Portable exam 0656 hours compared to 06/12/2020 FINDINGS: Enlargement of cardiac silhouette. Mediastinal contours and pulmonary vascularity normal. Atherosclerotic calcification aorta. Bibasilar atelectasis. No acute infiltrate, pleural effusion, or pneumothorax. Bones demineralized with advanced  degenerative changes of the RIGHT glenohumeral joint. IMPRESSION: Enlargement of cardiac silhouette with bibasilar atelectasis. Aortic Atherosclerosis (ICD10-I70.0). Electronically Signed   By: 08/12/2020 M.D.   On: 06/15/2020 08:06   ECHOCARDIOGRAM COMPLETE  Result Date: 06/13/2020    ECHOCARDIOGRAM REPORT   Patient Name:   NENE ARANAS Date of Exam: 06/13/2020 Medical Rec #:  08/13/2020           Height:       64.0 in Accession #:    [id number]          Weight:       157.0 lb Date of Birth:  [birth year]           BSA:          1.765 m Patient Age:    80 years            BP:           108/55 mmHg Patient Gender: F                   HR:           91 bpm. Exam Location:  13/03/1940 Procedure: 2D Echo Indications:    CHF-Acute Diastolic I50.31  History:        Patient has prior history of Echocardiogram examinations, most                 recent 01/16/2020. Risk Factors:Hypertension, Dyslipidemia and  Former Smoker.  Sonographer:    Jeryl Columbia RDCS (AE) Referring Phys: [id number] DAWOOD S ELGERGAWY IMPRESSIONS  1. Left ventricular ejection fraction, by estimation, is 55 to 60%. The left ventricle has normal function. The left ventricle has no regional wall motion abnormalities. There is mild concentric left ventricular hypertrophy of the basal-septal segment. Left ventricular diastolic parameters are consistent with Grade I diastolic dysfunction (impaired relaxation).  2. Right ventricular systolic function is normal. The right ventricular size is normal. There is mildly elevated pulmonary artery systolic pressure. The estimated right ventricular systolic pressure is 42.0 mmHg.  3. The mitral valve is grossly normal. No evidence of mitral valve regurgitation. No evidence of mitral stenosis. Severe mitral annular calcification.  4. The aortic valve was not well visualized. There is mild calcification of the aortic valve. Aortic valve regurgitation is not visualized. No aortic stenosis is present.  5.  The inferior vena cava is dilated in size with <50% respiratory variability, suggesting right atrial pressure of 15 mmHg. Comparison(s): A prior study was performed on 01/16/2020. Left ventricle is less hyperdynamic. FINDINGS  Left Ventricle: Left ventricular ejection fraction, by estimation, is 55 to 60%. The left ventricle has normal function. The left ventricle has no regional wall motion abnormalities. The left ventricular internal cavity size was normal in size. There is  mild concentric left ventricular hypertrophy of the basal-septal segment. Left ventricular diastolic parameters are consistent with Grade I diastolic dysfunction (impaired relaxation). Right Ventricle: The right ventricular size is normal. No increase in right ventricular wall thickness. Right ventricular systolic function is normal. There is mildly elevated pulmonary artery systolic pressure. The tricuspid regurgitant velocity is 2.60  m/s, and with an assumed right atrial pressure of 15 mmHg, the estimated right ventricular systolic pressure is 42.0 mmHg. Left Atrium: Left atrial size was normal in size. Right Atrium: Right atrial size was normal in size. Pericardium: There is no evidence of pericardial effusion. Mitral Valve: The mitral valve is grossly normal. There is mild thickening of the mitral valve leaflet(s). There is moderate calcification of the mitral valve leaflet(s). Severe mitral annular calcification. No evidence of mitral valve regurgitation. No evidence of mitral valve stenosis. MV peak gradient, 6.7 mmHg. The mean mitral valve gradient is 3.0 mmHg with average heart rate of 98 bpm. Tricuspid Valve: The tricuspid valve is grossly normal. Tricuspid valve regurgitation is trivial. No evidence of tricuspid stenosis. Aortic Valve: The aortic valve was not well visualized. There is mild calcification of the aortic valve. Aortic valve regurgitation is not visualized. No aortic stenosis is present. Pulmonic Valve: The pulmonic  valve was not well visualized. Pulmonic valve regurgitation is not visualized. No evidence of pulmonic stenosis. Aorta: The aortic root is normal in size and structure. Venous: The inferior vena cava is dilated in size with less than 50% respiratory variability, suggesting right atrial pressure of 15 mmHg. IAS/Shunts: The atrial septum is grossly normal.  LEFT VENTRICLE PLAX 2D LVIDd:         4.18 cm  Diastology LVIDs:         2.99 cm  LV e' medial:    5.00 cm/s LV PW:         1.40 cm  LV E/e' medial:  18.3 LV IVS:        1.35 cm  LV e' lateral:   4.24 cm/s LVOT diam:     2.10 cm  LV E/e' lateral: 21.5 LVOT Area:     3.46 cm  RIGHT VENTRICLE RV  S prime:     14.30 cm/s TAPSE (M-mode): 2.4 cm LEFT ATRIUM             Index       RIGHT ATRIUM           Index LA diam:        4.30 cm 2.44 cm/m  RA Area:     14.40 cm LA Vol (A2C):   52.3 ml 29.63 ml/m RA Volume:   35.60 ml  20.17 ml/m LA Vol (A4C):   48.3 ml 27.37 ml/m LA Biplane Vol: 52.1 ml 29.52 ml/m   AORTA Ao Root diam: 2.50 cm MITRAL VALVE                TRICUSPID VALVE MV Area (PHT): 3.36 cm     TR Peak grad:   27.0 mmHg MV Peak grad:  6.7 mmHg     TR Vmax:        260.00 cm/s MV Mean grad:  3.0 mmHg MV Vmax:       1.29 m/s     SHUNTS MV Vmean:      87.5 cm/s    Systemic Diam: 2.10 cm MV Decel Time: 226 msec MV E velocity: 91.30 cm/s MV A velocity: 120.00 cm/s MV E/A ratio:  0.76 Riley Lam MD Electronically signed by Riley Lam MD Signature Date/Time: 06/13/2020/3:04:50 PM    Final     Assessment: 80 year old female with history of Roux-en-Y gastric bypass, 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   Abdominal pain: markedly TTP RUQ on exam. US abdomen ordered for today to assess for cholelithiasis/cholecystitis. LFTs on admission normal but need to update. Concern for biliary etiology but known anastomotic ulcer could certainly be playing a role.  She is overdue for EGD surveillance to document ulcer healing. Abdominal pain persists since admission, but N/V have resolved.  Anemia: worsening anemia on admission, with Hgb 9.1 on admission and previously 11.1 in Nov 2021. No overt GI bleeding. Will update iron studies. Would recommend EGD once stable from cardiopulmonary standpoint.   Plan: RUQ [place] today Recheck CBC, HFP, add iron studies PPI BID Carafate QID Recommend EGD this admission once stable from cardiopulmonary standpoint:appreciate cardiology following May have clear liquids after [place] completed Will reassess tomorrow for readiness for EGD    Gelene Mink, PhD, ANP-BC [facility]    LOS: 3 days    06/15/2020, 9:03 AM

## 2020-06-15 NOTE — Consult Note (Addendum)
Cardiology Consultation:   Patient ID: Brenda Hopkins MRN: [id number]; DOB: 07/24/1940  Admit date: 06/12/2020 Date of Consult: 06/15/2020  PCP:  Samuella BruinMann, Benjamin L, PA-C   [facility]  Cardiologist:  Dina RichBranch, Levell Tavano, MD  Advanced Practice Provider:  No care team member to display Electrophysiologist:  None  [id number]}    Patient Profile:   Brenda DrapeBrenda Gail Trettin is a 80 y.o. female with a hx of hypertension who is being seen today for the evaluation of new CHF and question of A. fib at the request of Dr Flossie DibbleShahmehdi.  History of Present Illness:   Ms. Brenda Hopkins is a 80 year old female patient with history of gastric bypass, hypertension, anxiety.  She presented with shortness of breat,h leg edema, chest pain, epigastric pain, nausea and vomiting.  Patient was recently started on Lasix by PCP for lower extremity edema and a referral was put in for cardiology for possible atrial fibrillation.  Patient is a very poor historian and difficult to understand as she left her dentures at home. She is moaning in pain all over. She says she has been short of breath and legs started to swell last week. She's had a pain in her chest-pressure of and on for a week. Also epigastric pain. She lives alone, doesn't drive. She says her PCP told her she may have Afib. Denies palpitations or heart racing. BNP 1270 on admission.   Past Medical History:  Diagnosis Date  . Chronic back pain   . GERD (gastroesophageal reflux disease)   . Hiatal hernia   . Hyperlipidemia   . Hypertension   . Pelvic fracture (HCC) 12/12/2012  . Smoker     Past Surgical History:  Procedure Laterality Date  . ABDOMINAL HYSTERECTOMY    . APPENDECTOMY    . ESOPHAGOGASTRODUODENOSCOPY (EGD) WITH PROPOFOL N/A 01/17/2020   Procedure: ESOPHAGOGASTRODUODENOSCOPY (EGD) WITH PROPOFOL;  Surgeon: Dolores Frameastaneda Mayorga, Daniel, MD;  Location: [facility];  Service: Gastroenterology;  Laterality: N/A;  . GASTRIC BYPASS   03/14/2001  . HEMORROIDECTOMY    . LUMBAR SPINE SURGERY    . OOPHORECTOMY    . TONSILLECTOMY       Home Medications:  Prior to Admission medications   Medication Sig Start Date End Date Taking? Authorizing Provider  amLODipine (NORVASC) 10 MG tablet TAKE 1 TABLET BY MOUTH EVERY DAY 09/28/16  Yes Allayne Butcherixon, Mary B, PA-C  celecoxib (CELEBREX) 100 MG capsule Take 100 mg by mouth 2 (two) times daily. 05/04/20  Yes [provider]  diphenhydrAMINE (BENADRYL) 25 mg capsule Take 50 mg by mouth every 6 (six) hours as needed.   Yes [provider]  gabapentin (NEURONTIN) 600 MG tablet TAKE 1 TABLET BY MOUTH THREE TIMES DAILY 07/13/15  Yes Allayne Butcherixon, Mary B, PA-C  lisinopril (ZESTRIL) 10 MG tablet Take 10 mg by mouth daily. 12/04/19  Yes [provider]  oxyCODONE (ROXICODONE) 15 MG immediate release tablet Take 15 mg by mouth 4 (four) times daily.   Yes [provider]  pantoprazole (PROTONIX) 40 MG tablet Take 1 tablet (40 mg total) by mouth 2 (two) times daily. 01/31/20 04/30/20 Yes Emokpae, Courage, MD  sertraline (ZOLOFT) 25 MG tablet Take 25 mg by mouth daily. 05/04/20  Yes [provider]  Durel SaltsXARELTO STARTER PACK See admin instructions. follow package directions 06/05/20  Yes [provider]  atorvastatin (LIPITOR) 20 MG tablet TAKE 1 TABLET BY MOUTH EVERY NIGHT AT BEDTIME Patient not taking: No sig reported 07/11/16   Dorena Bodoixon, Mary B,  PA-C  ondansetron (ZOFRAN) 4 MG tablet Take 1 tablet (4 mg total) by mouth every 6 (six) hours. Patient not taking: No sig reported 01/31/20   Shon Hale, MD  potassium chloride SA (KLOR-CON) 20 MEQ tablet Take 20 mEq by mouth 2 (two) times daily. Patient not taking: No sig reported 01/02/20   [provider]  QUEtiapine (SEROQUEL) 200 MG tablet Take 200 mg by mouth at bedtime. 10/22/19   [provider]  rOPINIRole (REQUIP) 3 MG tablet Take 3 mg by mouth at bedtime. 10/23/19   [provider]   sucralfate (CARAFATE) 1 g tablet Take 1 tablet (1 g total) by mouth 2 (two) times daily. Patient not taking: Reported on 06/12/2020 01/31/20 03/01/20  Shon Hale, MD    Inpatient Medications: Scheduled Meds: . amLODipine  10 mg Oral Daily  . [START ON 06/16/2020] atorvastatin  20 mg Oral QHS  . enoxaparin (LOVENOX) injection  40 mg Subcutaneous Q24H  . furosemide  40 mg Intravenous BID  . gabapentin  600 mg Oral TID  . lisinopril  10 mg Oral Daily  . pantoprazole  40 mg Oral BID  . QUEtiapine  200 mg Oral QHS  . rOPINIRole  3 mg Oral QHS  . sodium chloride flush  3 mL Intravenous Q12H  . sucralfate  1 g Oral TID with meals   Continuous Infusions: . sodium chloride     PRN Meds: sodium chloride, acetaminophen, alum & mag hydroxide-simeth, nitroGLYCERIN, ondansetron (ZOFRAN) IV, oxyCODONE, sodium chloride flush  Allergies:    Allergies  Allergen Reactions  . Prozac [Fluoxetine Hcl] Other (See Comments)    Makes her fell crazy  . Zoloft [Sertraline Hcl]     Made her feel crazy--10/2013  . Nicoderm [Nicotine] Rash    Site rash    Social History:   Social History   Socioeconomic History  . Marital status: Divorced    Spouse name: Not on file  . Number of children: Not on file  . Years of education: Not on file  . Highest education level: Not on file  Occupational History  . Not on file  Tobacco Use  . Smoking status: Former Smoker    Packs/day: 0.50    Types: Cigarettes  . Smokeless tobacco: Former Neurosurgeon    Quit date: 12/28/2012  Vaping Use  . Vaping Use: Every day  Substance and Sexual Activity  . Alcohol use: No  . Drug use: No  . Sexual activity: Not on file  Other Topics Concern  . Not on file  Social History Narrative  . Not on file   Social Determinants of Health   Financial Resource Strain: Not on file  Food Insecurity: Not on file  Transportation Needs: Not on file  Physical Activity: Not on file  Stress: Not on file  Social Connections: Not  on file  Intimate Partner Violence: Not on file    Family History:     No family history on file.   ROS:  Please see the history of present illness.  Review of Systems  Constitutional: Negative.  HENT: Negative.   Eyes: Negative.   Cardiovascular: Positive for chest pain, dyspnea on exertion and leg swelling.  Respiratory: Negative.   Hematologic/Lymphatic: Negative.   Musculoskeletal: Positive for muscle cramps and myalgias. Negative for joint pain.  Gastrointestinal: Positive for abdominal pain, nausea and vomiting.  Genitourinary: Negative.   Neurological: Negative.     All other ROS reviewed and negative.     Physical Exam/Data:  Vitals:   06/14/20 1313 06/14/20 2042 06/15/20 0441 06/15/20 0600  BP: (!) 103/59 (!) 110/100 106/67   Pulse: 84 91 86   Resp: Temp: 97.7 F (36.5 C) 98.8 F (37.1 C) 98.2 F (36.8 C)   TempSrc: Oral Oral Oral   SpO2: 95% 92% 97%   Weight:    68.1 kg  Height:        Intake/Output Summary (Last 24 hours) at 06/15/2020 0928 Last data filed at 06/15/2020 0600 Gross per 24 hour  Intake 940 ml  Output 2305 ml  Net -1365 ml   Last 3 Weights 06/15/2020 06/14/2020 06/13/2020  Weight (lbs) 150 lb 2.1 oz 151 lb 7.3 oz 156 lb 15.5 oz  Weight (kg) 68.1 kg 68.7 kg 71.2 kg     Body mass index is 25.77 kg/m.  General:  Well nourished, well developed, in no acute distress  HEENT: normal Lymph: no adenopathy Neck: no JVD Endocrine:  No thryomegaly Vascular: No carotid bruits; FA pulses 2+ bilaterally without bruits  Cardiac:  normal S1, S2; RRR; no murmur   Lungs:  clear to auscultation bilaterally, no wheezing, rhonchi or rales  Abd: soft, nontender, no hepatomegaly  Ext: no edema Musculoskeletal:  No deformities, BUE and BLE strength normal and equal Skin: warm and dry  Neuro:  CNs 2-12 intact, no focal abnormalities noted Psych:  Normal affect   EKG:  The EKG was personally reviewed and demonstrates: 06/13/2020 normal sinus rhythm  with LVH and T wave inversion anterior laterally KG/1/22 looks like atrial tachycardia at 120 bpm Telemetry:  Telemetry was personally reviewed and demonstrates:  NSR with some atrial tachycardia  Relevant CV Studies: 2D echo 06/13/2020 IMPRESSIONS     1. Left ventricular ejection fraction, by estimation, is 55 to 60%. The  left ventricle has normal function. The left ventricle has no regional  wall motion abnormalities. There is mild concentric left ventricular  hypertrophy of the basal-septal segment.  Left ventricular diastolic parameters are consistent with Grade I  diastolic dysfunction (impaired relaxation).   2. Right ventricular systolic function is normal. The right ventricular  size is normal. There is mildly elevated pulmonary artery systolic  pressure. The estimated right ventricular systolic pressure is 42.0 mmHg.   3. The mitral valve is grossly normal. No evidence of mitral valve  regurgitation. No evidence of mitral stenosis. Severe mitral annular  calcification.   4. The aortic valve was not well visualized. There is mild calcification  of the aortic valve. Aortic valve regurgitation is not visualized. No  aortic stenosis is present.   5. The inferior vena cava is dilated in size with <50% respiratory  variability, suggesting right atrial pressure of 15 mmHg.   Comparison(s): A prior study was performed on 01/16/2020. Left ventricle is  less hyperdynamic.   2D echo 01/16/2020 IMPRESSIONS     1. Left ventricular ejection fraction, by estimation, is 60 to 65%. The  left ventricle has normal function. The left ventricle has no regional  wall motion abnormalities. There is moderate left ventricular hypertrophy.  Left ventricular diastolic  parameters are consistent with Grade I diastolic dysfunction (impaired  relaxation).   2. Right ventricular systolic function is normal. The right ventricular  size is normal.   3. The mitral valve is normal in structure. No  evidence of mitral valve  regurgitation. No evidence of mitral stenosis.   4. The aortic valve is tricuspid. There is mild calcification of the  aortic valve.  There is mild thickening of the aortic valve. Aortic valve  regurgitation is not visualized. No aortic stenosis is present.   5. The inferior vena cava is normal in size with greater than 50%  respiratory variability, suggesting right atrial pressure of 3 mmHg.   Laboratory Data:  High Sensitivity Troponin:   Recent Labs  Lab 06/12/20 1413 06/12/20 1614 06/12/20 2337 06/13/20 1351 06/13/20 1556  TROPONINIHS 71* 63* 43* 28* 29*     Chemistry Recent Labs  Lab 06/13/20 0606 06/14/20 0553 06/15/20 0441  NA 142 141 137  K 4.1 4.5 5.8*  CL 106 99 95*  CO2 29 34* 34*  GLUCOSE 85 84 84  BUN 23 16 18   CREATININE 0.75 0.67 0.85  CALCIUM 8.0* 8.2* 8.1*  GFRNONAA >60 >60 >60  ANIONGAP 7 8 8     Recent Labs  Lab 06/12/20 1413  PROT 6.5  ALBUMIN 3.1*  AST 31  ALT 24  ALKPHOS 109  BILITOT 0.6   Hematology Recent Labs  Lab 06/12/20 1413 06/13/20 0606  WBC 8.5 5.0  RBC 3.45* 3.04*  HGB 9.1* 8.0*  HCT 31.8* 28.0*  MCV 92.2 92.1  MCH 26.4 26.3  MCHC 28.6* 28.6*  RDW 17.8* 17.8*  PLT 366 284   BNP Recent Labs  Lab 06/13/20 0606 06/14/20 0553 06/15/20 0441  BNP 1,152.0* 589.0* 282.0*    DDimer No results for input(s): DDIMER in the last 168 hours.   Radiology/Studies:  DG Chest 1 View  Result Date: 06/12/2020 CLINICAL DATA:  Shortness of breath EXAM: CHEST  1 VIEW COMPARISON:  January 15, 2020 FINDINGS: There is ill-defined airspace opacity in the right upper lobe. There is scarring in the left base. The heart is enlarged with pulmonary vascularity normal, stable. There is aortic atherosclerosis. Bones are osteoporotic. There is extensive arthropathy in the right shoulder. IMPRESSION: Ill-defined airspace opacity consistent with pneumonia right upper lobe. Scarring left base. Stable cardiac enlargement.  Bones osteoporotic. Aortic Atherosclerosis (ICD10-I70.0). Electronically Signed   By: 08/12/2020 III M.D.   On: 06/12/2020 14:47   CT Angio Chest PE W and/or Wo Contrast  Result Date: 06/12/2020 CLINICAL DATA:  80 year old female with shortness of breath. EXAM: CT ANGIOGRAPHY CHEST WITH CONTRAST TECHNIQUE: Multidetector CT imaging of the chest was performed using the standard protocol during bolus administration of intravenous contrast. Multiplanar CT image reconstructions and MIPs were obtained to evaluate the vascular anatomy. CONTRAST:  Seventy-five mL Omnipaque 350, intravenous COMPARISON:  01/04/2020 FINDINGS: Cardiovascular: Satisfactory opacification of the pulmonary arteries to the segmental level. No evidence of pulmonary embolism. Similar appearing moderate global cardiomegaly. Mitral annular calcifications. Severe coronary atherosclerotic calcifications. Scattered atherosclerotic calcifications of the thoracic aorta. No pericardial effusion. Mediastinum/Nodes: No enlarged mediastinal, hilar, or axillary lymph nodes. The esophagus is patulous throughout with a standing column of enteric contents. Thyroid gland and trachea demonstrate no significant findings. Lungs/Pleura: Bibasilar and lingular subsegmental atelectasis. Mild upper lobe predominant centrilobular emphysema. No suspicious pulmonary nodules. No pleural effusion or pneumothorax. 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 The visualized upper abdomen is otherwise within normal limits. Musculoskeletal: Progressive compression deformity of the T9 vertebral body with associated kyphosis at this level, now nearly vertebra plana morphology. Mild, unchanged retropulsion. 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 Multilevel chronic, bilateral posterolateral nondisplaced rib fractures. Advanced degenerative changes of the glenohumeral  joints bilaterally. No acute osseous abnormality. Review of the MIP images confirms the above findings. IMPRESSION: Vascular:  1. No evidence of pulmonary embolism. 2. Unchanged moderate global cardiomegaly. 3. Severe coronary atherosclerotic calcifications. 4.  Aortic Atherosclerosis (ICD10-I70.0). Non-Vascular: 1. Bibasilar and lingular subsegmental atelectasis. 2. Progressive compression deformity of the T9 vertebral body, now essentially vertebra plana. Unchanged mild retropulsion. Marliss Coots, MD [facility] Electronically Signed   By: Marliss Coots MD   On: 06/12/2020 16:45   US Venous Img Lower  Left (DVT Study)  Result Date: 06/12/2020 CLINICAL DATA:  Lower extremity pain and edema EXAM: LEFT LOWER EXTREMITY VENOUS DUPLEX ULTRASOUND TECHNIQUE: Gray-scale sonography with graded compression, as well as color Doppler and duplex ultrasound were performed to evaluate the left lower extremity deep venous system from the level of the common femoral vein and including the common femoral, femoral, profunda femoral, popliteal and calf veins including the posterior tibial, peroneal and gastrocnemius veins when visible. The superficial great saphenous vein was also interrogated. Spectral Doppler was utilized to evaluate flow at rest and with distal augmentation maneuvers in the common femoral, femoral and popliteal veins. COMPARISON:  None. FINDINGS: Contralateral Common Femoral Vein: Respiratory phasicity is normal and symmetric with the symptomatic side. No evidence of thrombus. Normal compressibility. Common Femoral Vein: No evidence of thrombus. Normal compressibility, respiratory phasicity and response to augmentation. Saphenofemoral Junction: No evidence of thrombus. Normal compressibility and flow on color Doppler imaging. Profunda Femoral Vein: No evidence of thrombus. Normal compressibility and flow on color Doppler imaging. Femoral Vein: No evidence  of thrombus. Normal compressibility, respiratory phasicity and response to augmentation. Popliteal Vein: No evidence of thrombus. Normal compressibility, respiratory phasicity and response to augmentation. Calf Veins: No evidence of thrombus. Normal compressibility and flow on color Doppler imaging. Superficial Great Saphenous Vein: No evidence of thrombus. Normal compressibility. Venous Reflux:  None. Other Findings:  Soft tissue edema noted in the calf region. IMPRESSION: No evidence of deep venous thrombosis in the left lower extremity. Right common femoral vein patent. There is left calf region soft tissue edema. Electronically Signed   By: Bretta Bang III M.D.   On: 06/12/2020 14:46   DG CHEST PORT 1 VIEW  Result Date: 06/15/2020 CLINICAL DATA:  Shortness of breath, hypertension, former smoker EXAM: PORTABLE CHEST 1 VIEW COMPARISON:  Portable exam 0656 hours compared to 06/12/2020 FINDINGS: Enlargement of cardiac silhouette. Mediastinal contours and pulmonary vascularity normal. Atherosclerotic calcification aorta. Bibasilar atelectasis. No acute infiltrate, pleural effusion, or pneumothorax. Bones demineralized with advanced degenerative changes of the RIGHT glenohumeral joint. IMPRESSION: Enlargement of cardiac silhouette with bibasilar atelectasis. Aortic Atherosclerosis (ICD10-I70.0). Electronically Signed   By: Ulyses Southward M.D.   On: 06/15/2020 08:06   ECHOCARDIOGRAM COMPLETE  Result Date: 06/13/2020    ECHOCARDIOGRAM REPORT   Patient Name:   Brenda Hopkins Date of Exam: 06/13/2020 Medical Rec #:  [id number]           Height:       64.0 in Accession #:    [id number]          Weight:       157.0 lb Date of Birth:  1940-03-22           BSA:          1.765 m Patient Age:    79 years            BP:           108/55 mmHg Patient Gender: F  HR:           91 bpm. [facility] Procedure: 2D Echo Indications:    CHF-Acute Diastolic I50.31  History:        Patient has  prior history of Echocardiogram examinations, most                 recent 01/16/2020. Risk Factors:Hypertension, Dyslipidemia and                 Former Smoker.  Sonographer:    Jeryl Columbia RDCS (AE) Referring Phys: [id number] [address] [place] IMPRESSIONS  1. Left ventricular ejection fraction, by estimation, is 55 to 60%. The left ventricle has normal function. The left ventricle has no regional wall motion abnormalities. There is mild concentric left ventricular hypertrophy of the basal-septal segment. Left ventricular diastolic parameters are consistent with Grade I diastolic dysfunction (impaired relaxation).  2. Right ventricular systolic function is normal. The right ventricular size is normal. There is mildly elevated pulmonary artery systolic pressure. The estimated right ventricular systolic pressure is 42.0 mmHg.  3. The mitral valve is grossly normal. No evidence of mitral valve regurgitation. No evidence of mitral stenosis. Severe mitral annular calcification.  4. The aortic valve was not well visualized. There is mild calcification of the aortic valve. Aortic valve regurgitation is not visualized. No aortic stenosis is present.  5. The inferior vena cava is dilated in size with <50% respiratory variability, suggesting right atrial pressure of 15 mmHg. Comparison(s): A prior study was performed on 01/16/2020. Left ventricle is less hyperdynamic. FINDINGS  Left Ventricle: Left ventricular ejection fraction, by estimation, is 55 to 60%. The left ventricle has normal function. The left ventricle has no regional wall motion abnormalities. The left ventricular internal cavity size was normal in size. There is  mild concentric left ventricular hypertrophy of the basal-septal segment. Left ventricular diastolic parameters are consistent with Grade I diastolic dysfunction (impaired relaxation). Right Ventricle: The right ventricular size is normal. No increase in right ventricular wall thickness. Right ventricular  systolic function is normal. There is mildly elevated pulmonary artery systolic pressure. The tricuspid regurgitant velocity is 2.60  m/s, and with an assumed right atrial pressure of 15 mmHg, the estimated right ventricular systolic pressure is 42.0 mmHg. Left Atrium: Left atrial size was normal in size. Right Atrium: Right atrial size was normal in size. Pericardium: There is no evidence of pericardial effusion. Mitral Valve: The mitral valve is grossly normal. There is mild thickening of the mitral valve leaflet(s). There is moderate calcification of the mitral valve leaflet(s). Severe mitral annular calcification. No evidence of mitral valve regurgitation. No evidence of mitral valve stenosis. MV peak gradient, 6.7 mmHg. The mean mitral valve gradient is 3.0 mmHg with average heart rate of 98 bpm. Tricuspid Valve: The tricuspid valve is grossly normal. Tricuspid valve regurgitation is trivial. No evidence of tricuspid stenosis. Aortic Valve: The aortic valve was not well visualized. There is mild calcification of the aortic valve. Aortic valve regurgitation is not visualized. No aortic stenosis is present. Pulmonic Valve: The pulmonic valve was not well visualized. Pulmonic valve regurgitation is not visualized. No evidence of pulmonic stenosis. Aorta: The aortic root is normal in size and structure. Venous: The inferior vena cava is dilated in size with less than 50% respiratory variability, suggesting right atrial pressure of 15 mmHg. IAS/Shunts: The atrial septum is grossly normal.  LEFT VENTRICLE PLAX 2D LVIDd:         4.18 cm  Diastology LVIDs:         2.99 cm  LV e' medial:    5.00 cm/s LV PW:         1.40 cm  LV E/e' medial:  18.3 LV IVS:        1.35 cm  LV e' lateral:   4.24 cm/s LVOT diam:     2.10 cm  LV E/e' lateral: 21.5 LVOT Area:     3.46 cm  RIGHT VENTRICLE RV S prime:     14.30 cm/s TAPSE (M-mode): 2.4 cm LEFT ATRIUM             Index       RIGHT ATRIUM           Index LA diam:        4.30 cm  2.44 cm/m  RA Area:     14.40 cm LA Vol (A2C):   52.3 ml 29.63 ml/m RA Volume:   35.60 ml  20.17 ml/m LA Vol (A4C):   48.3 ml 27.37 ml/m LA Biplane Vol: 52.1 ml 29.52 ml/m   AORTA Ao Root diam: 2.50 cm MITRAL VALVE                TRICUSPID VALVE MV Area (PHT): 3.36 cm     TR Peak grad:   27.0 mmHg MV Peak grad:  6.7 mmHg     TR Vmax:        260.00 cm/s MV Mean grad:  3.0 mmHg MV Vmax:       1.29 m/s     SHUNTS MV Vmean:      87.5 cm/s    Systemic Diam: 2.10 cm MV Decel Time: 226 msec MV E velocity: 91.30 cm/s MV A velocity: 120.00 cm/s MV E/A ratio:  0.76 Riley Lam MD Electronically signed by Riley Lam MD Signature Date/Time: 06/13/2020/3:04:50 PM    Final      Assessment and Plan:   Acute diastolic CHF started on Lasix  4 days PTA by PCP for lower extremity edema BNP 1270, troponins flat.  2D echo LVEF 55 to 60% with grade 1 DD.  Diuresed 5.8 L since admission, 1.3 L past 24 hours on Lasix 40 mg IV twice daily BNP 282 today.would continue IV lasix one more day. K 5.8-would repeat before holding lisinopril  ?Atrial fibrillation was referred to cardiology by PCP 06/10/2020 for outpatient evaluation EKG here normal sinus rhythm and some atrial tachycardia.could use low dose beta blocker -may need to decrease amlodipine in order to add with low BP  Hypertension on amlodipine and lisinopril  Chest pain, Abnormal EKG with new T wave inversion anterior laterally when compared to EKG 01/2020.  2D echo normal LVEF, troponins flat, CT with severe coronary artery calcification. Likely has underlying heart disease. On amlodipine 10 mg, lisinopril 10 mg, lipitor 20 mg   Hyperlipidemia on a statin  Upper abdominal pain and anemia being followed by GI Hgb 8.0, for RUQ [place] today.   Risk Assessment/Risk Scores:     HEAR Score (for undifferentiated chest pain):  HEAR Score: 5  New York Heart Association (NYHA) Functional Class NYHA Class III        For questions or updates,  please contact [facility] Please consult [email or web address] for contact info under    Signed, Jacolyn Reedy, PA-C  06/15/2020 9:28 AM  Patient seen and discussed with PA Geni Bers, I agree with her documentation. 80 yo female history of HTN, chronic pain, admitted with SOB, LE edema,  and epigastric pain   Admit labs Hgb 9.1 (down from 11.1 4 months ago) WBC 8.5 Plt 366 K 3.7 Cr 0.89 BUN 27 BNP 1270 Lactic acid 1 TSH 1.2  Trop 71 -->63-->43-->28-->29 COVID neg CXR ?RUL pnuemonia LLE venous US: no DVT CT PE no PE, severe CAD. Bibasilar atelectasis EKG sinus tach, PACs, nonspecific ST/T changes, later EKGs lateral TWIs 06/2020 echo LVEF 55-60%, no WMAs, grade I dd, normal RV, PASP 42, RA pressure 15   Acute on chronic diastolic HF, I/Os are incomplete. Weight would suggest 6 lbs weight loss, BNP down from 1270 to 282. She is on IV lasix 40mg  bid, renal function is stable. Continue IV diuresis   Mild trop elevation in setting of diastolic HF and anemia, trending down. EKG with lateral TWIs in setting of LVH. Echo no WMAs. CAD by CT scan.  No plans for ischemic testing at this time.    Anemia Hgb down to 8 this admission, was 11/1 in 01/2020. Epigastric pain, ongoing workup by GI.  EKGs SR, PACs, ectopic atrial rhythm at times. No clear afib. Follow tele, depending on burden of ectopy may start beta blocker.   02/2020 MD

## 2020-06-16 DIAGNOSIS — K219 Gastro-esophageal reflux disease without esophagitis: Secondary | ICD-10-CM | POA: Diagnosis not present

## 2020-06-16 DIAGNOSIS — D649 Anemia, unspecified: Secondary | ICD-10-CM

## 2020-06-16 DIAGNOSIS — I5033 Acute on chronic diastolic (congestive) heart failure: Secondary | ICD-10-CM | POA: Diagnosis not present

## 2020-06-16 DIAGNOSIS — J9601 Acute respiratory failure with hypoxia: Secondary | ICD-10-CM | POA: Diagnosis not present

## 2020-06-16 DIAGNOSIS — R1013 Epigastric pain: Secondary | ICD-10-CM | POA: Diagnosis not present

## 2020-06-16 DIAGNOSIS — R1010 Upper abdominal pain, unspecified: Secondary | ICD-10-CM

## 2020-06-16 DIAGNOSIS — R109 Unspecified abdominal pain: Secondary | ICD-10-CM

## 2020-06-16 LAB — BASIC METABOLIC PANEL
Anion gap: 10 (ref 5–15)
Anion gap: 14 (ref 5–15)
BUN: 19 mg/dL (ref 8–23)
BUN: 19 mg/dL (ref 8–23)
CO2: 36 mmol/L — ABNORMAL HIGH (ref 22–32)
CO2: 36 mmol/L — ABNORMAL HIGH (ref 22–32)
Calcium: 8.7 mg/dL — ABNORMAL LOW (ref 8.9–10.3)
Calcium: 8.8 mg/dL — ABNORMAL LOW (ref 8.9–10.3)
Chloride: 89 mmol/L — ABNORMAL LOW (ref 98–111)
Chloride: 91 mmol/L — ABNORMAL LOW (ref 98–111)
Creatinine, Ser: 1.05 mg/dL — ABNORMAL HIGH (ref 0.44–1.00)
Creatinine, Ser: 1.03 mg/dL — ABNORMAL HIGH (ref 0.44–1.00)
GFR, Estimated: 55 mL/min — ABNORMAL LOW (ref >60)
GFR, Estimated: 56 mL/min — ABNORMAL LOW (ref >60)
Glucose, Bld: 94 mg/dL (ref 70–99)
Glucose, Bld: 82 mg/dL (ref 70–99)
Potassium: 5.7 mmol/L — ABNORMAL HIGH (ref 3.5–5.1)
Potassium: 4.8 mmol/L (ref 3.5–5.1)
Sodium: 135 mmol/L (ref 135–145)
Sodium: 141 mmol/L (ref 135–145)

## 2020-06-16 LAB — BRAIN NATRIURETIC PEPTIDE: B Natriuretic Peptide: 198 pg/mL — ABNORMAL HIGH (ref 0.0–100.0)

## 2020-06-16 LAB — HEMOGLOBIN AND HEMATOCRIT, BLOOD
HCT: 33.1 % — ABNORMAL LOW (ref 36.0–46.0)
Hemoglobin: 9.6 g/dL — ABNORMAL LOW (ref 12.0–15.0)

## 2020-06-16 LAB — MAGNESIUM: Magnesium: 2.1 mg/dL (ref 1.7–2.4)

## 2020-06-16 MED ORDER — FUROSEMIDE 10 MG/ML IJ SOLN: 40.0000 mg | Freq: Every day | INTRAMUSCULAR | Status: DC

## 2020-06-16 MED ORDER — SODIUM POLYSTYRENE SULFONATE 15 GM/60ML PO SUSP
30.0000 g | Freq: Once | ORAL | Status: AC
  Administered 2020-06-16: 30 g via ORAL
  Filled 2020-06-16: qty 120

## 2020-06-16 NOTE — Progress Notes (Addendum)
PROGRESS NOTE    Patient: Brenda Hopkins                            PCP: Samuella BruinMann, Benjamin L, PA-C                    DOB: 10/02/1940            DOA: 06/12/2020 WJX:[id number]             DOS: 06/16/2020, 11:42 AM   LOS: 4 days   Date of Service: The patient was seen and examined on 06/16/2020  Subjective:   She was seen and examined this morning, she has been n.p.o. for possible EGD today. Still complaining of mild-moderate epigastric pain, generalized aches and pain. Otherwise she is comfortable laying in bed.. No nausea or vomiting this morning.  N.p.o. awaiting GI evaluation for possible EGD  Brief Narrative:   Brenda Hopkins  is a 80 y.o. female, with medical history significant forhypertension, anxiety, marginalulcer, tobacco abuseandchronic pain due to complaints of shortness of breath, reports symptoms going on for last week, reports intense with exertion, and laying flat, as well she does report some epigastric/lower chest pain, does not radiate, as well she does report worsening lower extremity edema, denies fever, chills, cough, nausea or vomiting,.  she denies any dysuria or polyuria, reports she was recently seen by her PCP for lower extremity edema which she was started on Lasix for last 4 days, report was told to have A. fib, and referral has been made to cardiologist but she did not get to see them yet, as well she does report history of COPD, but she denies any cough, productive sputum or wheezing.  -Was significant for BNP of 1270, chest x-ray significant for vascular congestion, EKG nonacute, troponins non-ACS pattern 71>63, was given IV Lasix, and [facility] hospitalist consulted to admit for CHF.   Assessment & Plan:   Active Problems:   Hypertension   Hyperlipidemia   GERD (gastroesophageal reflux disease)   Acute respiratory failure with hypoxia (HCC)   Acute on chronic diastolic CHF (congestive heart failure) (HCC)   Abdominal pain   Anemia  Epigastric chest  pain -She was on clear liquid diet yesterday, n.p.o. overnight for possible EGD today -Reporting some improvement but still having pain, -No nausea or vomiting this morning. -Post prandial with every meal developed gastric pain then followed with nausea vomiting -No relief from Protonix, Carafate, Maalox -Consulting gastroenterology >>> appreciate input, anticipating EGD today  -Ultrasound right upper quadrant negative for cholelithiasis or cholecystitis  Chest pain -Gastric, -Repeating EKGs within normal limits, -Atypical she had some reproducible epigastric pain on palpation,  - EKG nonacute, ACS ruled out, troponin 71> 63 >> 43, 28, 29 -As needed nitroglycerin aspirin oxygen    Hyperkalemia -Status post treatment with  PO supplement potassium yesterday potassium 5.7 this AM  -Remained asymptomatic -We will treat with Kayexalate, repeating BMP  Acute on chronic diastolic CHF -Stable, improved, on 2 L of oxygen satting 90% -BNP 1270 >>> 589, 282, 198 - >6 Ls L since admission -Continue IV Lasix 40 mg daily...  -Troponin 71, 63, 43, 28, 29 -elevated BNP, vascular congestion on imaging, worsening lower extremity edema. -Monitoring I's and O's, daily weight -2D echocardiogram >> reviewed ejection fraction 55-60%, diastolic congestive heart failure grade 1 -Considering consulting cardiology -She is admitted under CHF pathway,  on IV Lasix, daily weights, strict ins and outs. -Cardiology -  appreciate input and close follow-up  -CTA: Reviewed-pulmonary embolism, cardiomegaly, severe coronary atherosclerosis, aortic atherosclerosis,.. Bilateral sublingual atelectasis, T9 vertebral compression deformity   ?Atrial fibrillation  -Reported possible A. fib as an outpatient -She has been normal sinus rhythm, tachycardia at times, all EKGs reviewed during hospital course and monitor... No atrial fibrillation or flutter was observedt -Reported by PCP, current EKG normal sinus rhythm -TSH  normal at 1.24. -We will continue to monitor, remained to be in normal sinus rhythm  COPD-No active wheezing, no with home medications... Remains on 2 L of oxygen 90%  Possible [facility] pneumonia/bronchitis -Blood cultures been obtained, continue current IV antibiotics (Rocephin/azithromycin)  Hyperlipidemia -Continue with statin  Hypertension -Continue with Norvasc and lisinopril  Chronic pain syndrome -Continue with home medications    ---------------------------------------------------------------------------------------------------------------------------------------- Nutritional status:  The patient's BMI is: Body mass index is 25.47 kg/m. I agree with the assessment and plan as outlined ---------------------------------------------------------------------------------------------------------------------------------------- Cultures; Blood cultures x2  Antimicrobials: IV Rocephin/azithromycin  Consultants: Cardiology/gastroenterologist  ---------------------------------------------------------------------------------------------------------------------------------------- DVT prophylaxis:  SCD/Compression stockings and Lovenox SQ Code Status:   Code Status: Full Code  Family Communication: Updated her daughter Ms. Caman [phone] The above findings and plan of care has been discussed with patient  in detail,  they expressed understanding and agreement of above. -Advance care planning has been discussed.   Admission status:   Status is: Inpatient  Remains inpatient appropriate because:Hemodynamically unstable and Inpatient level of care appropriate due to severity of illness   Dispo: The patient is from: Home              Anticipated d/c is to: Home              Patient currently is not medically stable to d/c.   Difficult to place patient No     Level of care: Telemetry   Procedures:   No admission procedures for hospital  encounter.    Antimicrobials:  Anti-infectives (From admission, onward)   Start     Dose/Rate Route Frequency Ordered Stop   06/12/20 1500  cefTRIAXone (ROCEPHIN) 1 g in sodium chloride 0.9 % 100 mL IVPB        1 g 200 mL/hr over 30 Minutes Intravenous  Once 06/12/20 1454 06/12/20 1756   06/12/20 1500  azithromycin (ZITHROMAX) 500 mg in sodium chloride 0.9 % 250 mL IVPB        500 mg 250 mL/hr over 60 Minutes Intravenous  Once 06/12/20 1454 06/12/20 1904       Medication:  . amLODipine  10 mg Oral Daily  . atorvastatin  20 mg Oral QHS  . enoxaparin (LOVENOX) injection  40 mg Subcutaneous Q24H  . [START ON 06/17/2020] furosemide  40 mg Intravenous Daily  . gabapentin  600 mg Oral TID  . pantoprazole  40 mg Oral BID  . QUEtiapine  200 mg Oral QHS  . rOPINIRole  3 mg Oral QHS  . sodium chloride flush  3 mL Intravenous Q12H  . sucralfate  1 g Oral TID with meals    sodium chloride, acetaminophen, alum & mag hydroxide-simeth, nitroGLYCERIN, ondansetron (ZOFRAN) IV, oxyCODONE, sodium chloride flush   Objective:   Vitals:   06/15/20 2005 06/16/20 0048 06/16/20 0219 06/16/20 0500  BP: 126/64 (!) 100/58 (!) 144/75   Pulse: 81 85    Resp: 18 19    Temp: 98.4 F (36.9 C) 97.8 F (36.6 C)    TempSrc: Oral Oral    SpO2: 94% 93%    Weight:  67.3 kg  Height:        Intake/Output Summary (Last 24 hours) at 06/16/2020 1142 Last data filed at 06/16/2020 3976 Gross per 24 hour  Intake 120 ml  Output 2700 ml  Net -2580 ml   Filed Weights   06/14/20 0448 06/15/20 0600 06/16/20 0500  Weight: 68.7 kg 68.1 kg 67.3 kg     Examination:       Physical Exam:   General:  Alert, oriented, cooperative, mild to moderate distress still complaining of epigastric pain--also generalized aches and pain  HEENT:  Normocephalic, PERRL, otherwise with in Normal limits   Neuro:  CNII-XII intact. , normal motor and sensation, reflexes intact   Lungs:   Clear to auscultation BL, Respirations  unlabored, no wheezes / crackles  Cardio:    S1/S2, RRR, No murmure, No Rubs or Gallops   Abdomen:   Soft, non-tender, bowel sounds active all four quadrants,  no guarding or peritoneal signs.  Muscular skeletal:   Generalized weaknesses, Limited exam - in bed, able to move all 4 extremities, Normal strength,  2+ pulses,  symmetric, No pitting edema  Skin:  Dry, warm to touch, negative for any Rashes,  Wounds: Please see nursing documentation        ------------------------------------------------------------------------------------------------------------------------------------------    LABs:  CBC Latest Ref Rng & Units 06/16/2020 06/15/2020 06/13/2020  WBC 4.0 - 10.5 K/uL - 4.6 5.0  Hemoglobin 12.0 - 15.0 g/dL 7.3(A) 10.3(L) 8.0(L)  Hematocrit 36.0 - 46.0 % 33.1(L) 36.6 28.0(L)  Platelets 150 - 400 K/uL - 310 284   CMP Latest Ref Rng & Units 06/16/2020 06/15/2020 06/14/2020  Glucose 70 - 99 mg/dL 94 84 84  BUN 8 - 23 mg/dL 19 18 16   Creatinine 0.44 - 1.00 mg/dL ) 1.93(X 9.02  Sodium 135 - 145 mmol/L 135 137 141  Potassium 3.5 - 5.1 mmol/L 5.7(H) 5.8(H) 4.5  Chloride 98 - 111 mmol/L 89(L) 95(L) 99  CO2 22 - 32 mmol/L 36(H) 34(H) 34(H)  Calcium 8.9 - 10.3 mg/dL 4.09) 8.1(L) 8.2(L)  Total Protein 6.5 - 8.1 g/dL - 6.4(L) -  Total Bilirubin 0.3 - 1.2 mg/dL - 0.6 -  Alkaline Phos 38 - 126 U/L - 99 -  AST 15 - 41 U/L - 16 -  ALT 0 - 44 U/L - 15 -       Micro Results Recent Results (from the past 240 hour(s))  Blood culture (routine x 2)     Status: None (Preliminary result)   Collection Time: 06/12/20  3:05 PM   Specimen: BLOOD LEFT HAND  Result Value Ref Range Status   Specimen Description BLOOD LEFT HAND  Final   Special Requests   Final    Blood Culture adequate volume BOTTLES DRAWN AEROBIC AND ANAEROBIC   Culture   Final    NO GROWTH 4 DAYS Performed at [facility], [address] [place], [place]    Report Status PENDING  Incomplete  Blood culture (routine  x 2)     Status: None (Preliminary result)   Collection Time: 06/12/20  3:05 PM   Specimen: BLOOD RIGHT HAND  Result Value Ref Range Status   Specimen Description BLOOD RIGHT HAND  Final   Special Requests   Final    Blood Culture results may not be optimal due to an inadequate volume of blood received in culture bottles BOTTLES DRAWN AEROBIC AND ANAEROBIC   Culture   Final    NO GROWTH 4 DAYS Performed at  [facility], [address] [place], [place] [address]    Report Status PENDING  Incomplete  Resp Panel by RT-PCR (Flu A&B, Covid) Nasopharyngeal Swab     Status: None   Collection Time: 06/12/20  3:51 PM   Specimen: Nasopharyngeal Swab; Nasopharyngeal(NP) swabs in vial transport medium  Result Value Ref Range Status   SARS Coronavirus 2 by RT PCR NEGATIVE NEGATIVE Final    Comment: (NOTE) SARS-CoV-2 target nucleic acids are NOT DETECTED.  The SARS-CoV-2 RNA is generally detectable in upper respiratory specimens during the acute phase of infection. The lowest concentration of SARS-CoV-2 viral copies this assay can detect is 138 copies/mL. A negative result does not preclude SARS-Cov-2 infection and should not be used as the sole basis for treatment or other patient management decisions. A negative result may occur with  improper specimen collection/handling, submission of specimen other than nasopharyngeal swab, presence of viral mutation(s) within the areas targeted by this assay, and inadequate number of viral copies(<138 copies/mL). A negative result must be combined with clinical observations, patient history, and epidemiological information. The expected result is Negative.  Fact Sheet for Patients:  BloggerCourse.com  Fact Sheet for Healthcare Providers:  SeriousBroker.it  This test is no t yet approved or cleared by the [place] FDA and  has been authorized for detection and/or diagnosis of SARS-CoV-2 by FDA  under an Emergency Use Authorization (EUA). This EUA will remain  in effect (meaning this test can be used) for the duration of the COVID-19 declaration under Section 564(b)(1) of the Act, 21 U.S.C.section 360bbb-3(b)(1), unless the authorization is terminated  or revoked sooner.       Influenza A by PCR NEGATIVE NEGATIVE Final   Influenza B by PCR NEGATIVE NEGATIVE Final    Comment: (NOTE) The Xpert Xpress SARS-CoV-2/FLU/RSV plus assay is intended as an aid in the diagnosis of influenza from Nasopharyngeal swab specimens and should not be used as a sole basis for treatment. Nasal washings and aspirates are unacceptable for Xpert Xpress SARS-CoV-2/FLU/RSV testing.  Fact Sheet for Patients: BloggerCourse.com  Fact Sheet for Healthcare Providers: SeriousBroker.it  This test is not yet approved or cleared by the [place] FDA and has been authorized for detection and/or diagnosis of SARS-CoV-2 by FDA under an Emergency Use Authorization (EUA). This EUA will remain in effect (meaning this test can be used) for the duration of the COVID-19 declaration under Section 564(b)(1) of the Act, 21 U.S.C. section 360bbb-3(b)(1), unless the authorization is terminated or revoked.  Performed at [facility], [address], [place], [place] [address]     Radiology Reports DG Chest 1 View  Result Date: 06/12/2020 CLINICAL DATA:  Shortness of breath EXAM: CHEST  1 VIEW COMPARISON:  January 15, 2020 FINDINGS: There is ill-defined airspace opacity in the right upper lobe. There is scarring in the left base. The heart is enlarged with pulmonary vascularity normal, stable. There is aortic atherosclerosis. Bones are osteoporotic. There is extensive arthropathy in the right shoulder. IMPRESSION: Ill-defined airspace opacity consistent with pneumonia right upper lobe. Scarring left base. Stable cardiac enlargement. Bones osteoporotic. Aortic  Atherosclerosis (ICD10-I70.0). Electronically Signed   By: Bretta Bang III M.D.   On: 06/12/2020 14:47   CT Angio Chest PE W and/or Wo Contrast  Result Date: 06/12/2020 CLINICAL DATA:  80 year old female with shortness of breath. EXAM: CT ANGIOGRAPHY CHEST WITH CONTRAST TECHNIQUE: Multidetector CT imaging of the chest was performed using the standard protocol during bolus administration of intravenous contrast. Multiplanar CT image  reconstructions and MIPs were obtained to evaluate the vascular anatomy. CONTRAST:  Seventy-five mL Omnipaque 350, intravenous COMPARISON:  01/04/2020 FINDINGS: Cardiovascular: Satisfactory opacification of the pulmonary arteries to the segmental level. No evidence of pulmonary embolism. Similar appearing moderate global cardiomegaly. Mitral annular calcifications. Severe coronary atherosclerotic calcifications. Scattered atherosclerotic calcifications of the thoracic aorta. No pericardial effusion. Mediastinum/Nodes: No enlarged mediastinal, hilar, or axillary lymph nodes. The esophagus is patulous throughout with a standing column of enteric contents. Thyroid gland and trachea demonstrate no significant findings. Lungs/Pleura: Bibasilar and lingular subsegmental atelectasis. Mild upper lobe predominant centrilobular emphysema. No suspicious pulmonary nodules. No pleural effusion or pneumothorax. 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 The visualized upper abdomen is otherwise within normal limits. Musculoskeletal: Progressive compression deformity of the T9 vertebral body with associated kyphosis at this level, now nearly vertebra plana morphology. Mild, unchanged retropulsion. 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 Multilevel chronic, bilateral posterolateral nondisplaced rib fractures. Advanced degenerative changes of the glenohumeral joints bilaterally. No acute  osseous abnormality. Review of the MIP images confirms the above findings. IMPRESSION: Vascular: 1. No evidence of pulmonary embolism. 2. Unchanged moderate global cardiomegaly. 3. Severe coronary atherosclerotic calcifications. 4.  Aortic Atherosclerosis (ICD10-I70.0). Non-Vascular: 1. Bibasilar and lingular subsegmental atelectasis. 2. Progressive compression deformity of the T9 vertebral body, now essentially vertebra plana. Unchanged mild retropulsion. Marliss Coots, MD [facility] Radiology Electronically Signed   By: Marliss Coots MD   On: 06/12/2020 16:45   US Venous Img Lower  Left (DVT Study)  Result Date: 06/12/2020 CLINICAL DATA:  Lower extremity pain and edema EXAM: LEFT LOWER EXTREMITY VENOUS DUPLEX ULTRASOUND TECHNIQUE: Gray-scale sonography with graded compression, as well as color Doppler and duplex ultrasound were performed to evaluate the left lower extremity deep venous system from the level of the common femoral vein and including the common femoral, femoral, profunda femoral, popliteal and calf veins including the posterior tibial, peroneal and gastrocnemius veins when visible. The superficial great saphenous vein was also interrogated. Spectral Doppler was utilized to evaluate flow at rest and with distal augmentation maneuvers in the common femoral, femoral and popliteal veins. COMPARISON:  None. FINDINGS: Contralateral Common Femoral Vein: Respiratory phasicity is normal and symmetric with the symptomatic side. No evidence of thrombus. Normal compressibility. Common Femoral Vein: No evidence of thrombus. Normal compressibility, respiratory phasicity and response to augmentation. Saphenofemoral Junction: No evidence of thrombus. Normal compressibility and flow on color Doppler imaging. Profunda Femoral Vein: No evidence of thrombus. Normal compressibility and flow on color Doppler imaging. Femoral Vein: No evidence of thrombus. Normal  compressibility, respiratory phasicity and response to augmentation. Popliteal Vein: No evidence of thrombus. Normal compressibility, respiratory phasicity and response to augmentation. Calf Veins: No evidence of thrombus. Normal compressibility and flow on color Doppler imaging. Superficial Great Saphenous Vein: No evidence of thrombus. Normal compressibility. Venous Reflux:  None. Other Findings:  Soft tissue edema noted in the calf region. IMPRESSION: No evidence of deep venous thrombosis in the left lower extremity. Right common femoral vein patent. There is left calf region soft tissue edema. Electronically Signed   By: Bretta Bang III M.D.   On: 06/12/2020 14:46   DG CHEST PORT 1 VIEW  Result Date: 06/15/2020 CLINICAL DATA:  Shortness of breath, hypertension, former smoker EXAM: PORTABLE CHEST 1 VIEW COMPARISON:  Portable exam 0656 hours compared to 06/12/2020 FINDINGS: Enlargement of cardiac silhouette. Mediastinal contours and pulmonary vascularity normal. Atherosclerotic calcification  aorta. Bibasilar atelectasis. No acute infiltrate, pleural effusion, or pneumothorax. Bones demineralized with advanced degenerative changes of the RIGHT glenohumeral joint. IMPRESSION: Enlargement of cardiac silhouette with bibasilar atelectasis. Aortic Atherosclerosis (ICD10-I70.0). Electronically Signed   By: Ulyses Southward M.D.   On: 06/15/2020 08:06   ECHOCARDIOGRAM COMPLETE  Result Date: 06/13/2020    ECHOCARDIOGRAM REPORT   Patient Name:   Brenda Hopkins Date of Exam: 06/13/2020 Medical Rec #:  [id number]           Height:       64.0 in Accession #:    [id number]          Weight:       157.0 lb Date of Birth:  05-13-1940           BSA:          1.765 m Patient Age:    79 years            BP:           108/55 mmHg Patient Gender: F                   HR:           91 bpm. [facility] Procedure: 2D Echo Indications:    CHF-Acute Diastolic I50.31  History:        Patient has prior history of  Echocardiogram examinations, most                 recent 01/16/2020. Risk Factors:Hypertension, Dyslipidemia and                 Former Smoker.  Sonographer:    Jeryl Columbia RDCS (AE) Referring Phys: [id number] [address] IMPRESSIONS  1. Left ventricular ejection fraction, by estimation, is 55 to 60%. The left ventricle has normal function. The left ventricle has no regional wall motion abnormalities. There is mild concentric left ventricular hypertrophy of the basal-septal segment. Left ventricular diastolic parameters are consistent with Grade I diastolic dysfunction (impaired relaxation).  2. Right ventricular systolic function is normal. The right ventricular size is normal. There is mildly elevated pulmonary artery systolic pressure. The estimated right ventricular systolic pressure is 42.0 mmHg.  3. The mitral valve is grossly normal. No evidence of mitral valve regurgitation. No evidence of mitral stenosis. Severe mitral annular calcification.  4. The aortic valve was not well visualized. There is mild calcification of the aortic valve. Aortic valve regurgitation is not visualized. No aortic stenosis is present.  5. The inferior vena cava is dilated in size with <50% respiratory variability, suggesting right atrial pressure of 15 mmHg. Comparison(s): A prior study was performed on 01/16/2020. Left ventricle is less hyperdynamic. FINDINGS  Left Ventricle: Left ventricular ejection fraction, by estimation, is 55 to 60%. The left ventricle has normal function. The left ventricle has no regional wall motion abnormalities. The left ventricular internal cavity size was normal in size. There is  mild concentric left ventricular hypertrophy of the basal-septal segment. Left ventricular diastolic parameters are consistent with Grade I diastolic dysfunction (impaired relaxation). Right Ventricle: The right ventricular size is normal. No increase in right ventricular wall thickness. Right ventricular systolic  function is normal. There is mildly elevated pulmonary artery systolic pressure. The tricuspid regurgitant velocity is 2.60  m/s, and with an assumed right atrial pressure of 15 mmHg, the estimated right ventricular systolic pressure is 42.0 mmHg. Left Atrium: Left atrial size was normal in size. Right Atrium:  Right atrial size was normal in size. Pericardium: There is no evidence of pericardial effusion. Mitral Valve: The mitral valve is grossly normal. There is mild thickening of the mitral valve leaflet(s). There is moderate calcification of the mitral valve leaflet(s). Severe mitral annular calcification. No evidence of mitral valve regurgitation. No evidence of mitral valve stenosis. MV peak gradient, 6.7 mmHg. The mean mitral valve gradient is 3.0 mmHg with average heart rate of 98 bpm. Tricuspid Valve: The tricuspid valve is grossly normal. Tricuspid valve regurgitation is trivial. No evidence of tricuspid stenosis. Aortic Valve: The aortic valve was not well visualized. There is mild calcification of the aortic valve. Aortic valve regurgitation is not visualized. No aortic stenosis is present. Pulmonic Valve: The pulmonic valve was not well visualized. Pulmonic valve regurgitation is not visualized. No evidence of pulmonic stenosis. Aorta: The aortic root is normal in size and structure. Venous: The inferior vena cava is dilated in size with less than 50% respiratory variability, suggesting right atrial pressure of 15 mmHg. IAS/Shunts: The atrial septum is grossly normal.  LEFT VENTRICLE PLAX 2D LVIDd:         4.18 cm  Diastology LVIDs:         2.99 cm  LV e' medial:    5.00 cm/s LV PW:         1.40 cm  LV E/e' medial:  18.3 LV IVS:        1.35 cm  LV e' lateral:   4.24 cm/s LVOT diam:     2.10 cm  LV E/e' lateral: 21.5 LVOT Area:     3.46 cm  RIGHT VENTRICLE RV S prime:     14.30 cm/s TAPSE (M-mode): 2.4 cm LEFT ATRIUM             Index       RIGHT ATRIUM           Index LA diam:        4.30 cm 2.44 cm/m   RA Area:     14.40 cm LA Vol (A2C):   52.3 ml 29.63 ml/m RA Volume:   35.60 ml  20.17 ml/m LA Vol (A4C):   48.3 ml 27.37 ml/m LA Biplane Vol: 52.1 ml 29.52 ml/m   AORTA Ao Root diam: 2.50 cm MITRAL VALVE                TRICUSPID VALVE MV Area (PHT): 3.36 cm     TR Peak grad:   27.0 mmHg MV Peak grad:  6.7 mmHg     TR Vmax:        260.00 cm/s MV Mean grad:  3.0 mmHg MV Vmax:       1.29 m/s     SHUNTS MV Vmean:      87.5 cm/s    Systemic Diam: 2.10 cm MV Decel Time: 226 msec MV E velocity: 91.30 cm/s MV A velocity: 120.00 cm/s MV E/A ratio:  0.76 Riley Lam MD Electronically signed by Riley Lam MD Signature Date/Time: 06/13/2020/3:04:50 PM    Final    US Abdomen Limited RUQ (LIVER/GB)  Result Date: 06/15/2020 CLINICAL DATA:  Diffuse abdominal pain. 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 EXAM: ULTRASOUND ABDOMEN LIMITED RIGHT UPPER QUADRANT COMPARISON:  Prior CT scan November 2021 FINDINGS: Gallbladder: No gallstones or wall thickening visualized. No sonographic Murphy sign noted by sonographer. Common bile duct: Diameter: Within normal limits at 5-6 mm Liver: No focal lesion identified. Within normal limits in parenchymal  echogenicity. Portal vein is patent on color Doppler imaging with normal direction of blood flow towards the liver. Other: None. IMPRESSION: Negative right upper quadrant ultrasound. Electronically Signed   By: Malachy Moan M.D.   On: 06/15/2020 09:55    SIGNED: Kendell Bane, MD, FHM. [facility],  Pager (please use [email or web address] to page/text) Please use Epic Secure Chat for non-urgent communication (7AM-7PM)  If 7PM-7AM, please contact night-coverage [email or web address], 06/16/2020, 11:42 AM

## 2020-06-16 NOTE — Progress Notes (Signed)
10 beat run of Vtach - BP stable. Added mag to morning labs.

## 2020-06-16 NOTE — Progress Notes (Addendum)
CCMD called and stated "patient had 10 beat wide QRS". Went to room assessed patient, vitals done, EKG done. Systolic BP soft, but patient responsive. Notified MD and received new orders. Will continue to monitor patient.    Rechecked patient BP at 0218 it is 144/75. Notified MD.

## 2020-06-16 NOTE — Progress Notes (Signed)
Subjective: Patient was sleeping when I entered the room.  I woke her up and she was a bit agitated, but was oriented x4 and was aware of plans for possible EGD today.  She reports she continues with upper abdominal pain without change since admission.  Reports an episode of vomiting after dinner last night without hematemesis or coffee-ground emesis.  No BM.  Denies chest pain or shortness of breath.  Remains on supplemental oxygen.  She did have a 10 beat run of V. tach overnight.  Potassium is elevated at 5.7 today.  Spoke with the nurse who states hospitalist has requested she give patient Kayexalate.  Objective: Vital signs in last 24 hours: Temp:  [97.8 F (36.6 C)-98.4 F (36.9 C)] 97.8 F (36.6 C) (04/05 0048) Pulse Rate:  [81-92] 85 (04/05 0048) Resp:  [18-19] 19 (04/05 0048) BP: (100-144)/(58-75) 144/75 (04/05 0219) SpO2:  [91 %-94 %] 93 % (04/05 0048) Weight:  [67.3 kg] 67.3 kg (04/05 0500) Last BM Date: 06/12/20 General:   Alert and oriented and in no acute distress, irritable. Head:  Normocephalic and atraumatic. Heart:  S1, S2 present, no murmurs noted.  Abdomen:  Bowel sounds present, soft, and non-distended.  Mild TTP in the epigastric and RUQ region.  Minimal TTP across lower abdomen.  No rebound or guarding.  Moderate size supraumbilical hernia that is soft.  Extremities:  Without edema. Neurologic:  Alert and  oriented x4 Psych:  Irritable.   Intake/Output from previous day: 04/04 0701 - 04/05 0700 In: 120 [P.O.:120] Out: 3050 [Urine:3050] Intake/Output this shift: No intake/output data recorded.  Lab Results: Recent Labs    06/15/20 1204  WBC 4.6  HGB 10.3*  HCT 36.6  PLT 310   BMET Recent Labs    06/14/20 0553 06/15/20 0441 06/16/20 0454  NA 141 137 135  K 4.5 5.8* 5.7*  CL 99 95* 89*  CO2 34* 34* 36*  GLUCOSE 84 84 94  BUN 16 18 19   CREATININE 0.67 0.85 1.05*  CALCIUM 8.2* 8.1* 8.7*   LFT Recent Labs    06/15/20 1204  PROT 6.4*   ALBUMIN 3.0*  AST 16  ALT 15  ALKPHOS 99  BILITOT 0.6  BILIDIR 0.1  IBILI 0.5    Studies/Results: DG CHEST PORT 1 VIEW  Result Date: 06/15/2020 CLINICAL DATA:  Shortness of breath, hypertension, former smoker EXAM: PORTABLE CHEST 1 VIEW COMPARISON:  Portable exam 0656 hours compared to 06/12/2020 FINDINGS: Enlargement of cardiac silhouette. Mediastinal contours and pulmonary vascularity normal. Atherosclerotic calcification aorta. Bibasilar atelectasis. No acute infiltrate, pleural effusion, or pneumothorax. Bones demineralized with advanced degenerative changes of the RIGHT glenohumeral joint. IMPRESSION: Enlargement of cardiac silhouette with bibasilar atelectasis. Aortic Atherosclerosis (ICD10-I70.0). Electronically Signed   By: 08/12/2020 M.D.   On: 06/15/2020 08:06   08/15/2020 Abdomen Limited RUQ (LIVER/GB)  Result Date: 06/15/2020 CLINICAL DATA:  Diffuse abdominal pain. 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 EXAM: ULTRASOUND ABDOMEN LIMITED RIGHT UPPER QUADRANT COMPARISON:  Prior CT scan November 2021 FINDINGS: Gallbladder: No gallstones or wall thickening visualized. No sonographic Murphy sign noted by sonographer. Common bile duct: Diameter: Within normal limits at 5-6 mm Liver: No focal lesion identified. Within normal limits in parenchymal echogenicity. Portal vein is patent on color Doppler imaging with normal direction of blood flow towards the liver. Other: None. IMPRESSION: Negative right upper quadrant ultrasound. Electronically Signed   By: December 2021 M.D.   On: 06/15/2020 09:55  Assessment: 80 year old female with history of Roux-en-Y gastric bypass, anastomotic ulcer on EGD in Nov 2021, presenting with acute on chronic heart failure, and GI consulted due to epigastric abdominal pain, N/V.   Epigastric abdominal pain: Patient denies any improvement in epigastric pain since admission though comparing my PE findings to yesterdays findings,  seems pain may be somewhat improved. Also with single episode of emesis yesterday after dinner per patient. No hematemesis or coffee groune emesis. 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 Reported compliance with PPI BID but had been taking Celebrex since February. RUQ ultrasound yesterday with no acute findings, no cholelithiasis.  LFTs remain within normal limits. Suspect abdominal pain is likely secondary to PUD. She is overdue for surveillance EGD and we had plans on pursuing that today. However,  she had a 10 beat run of V. tach early this morning around 1 am, and her potassium is elevated at 5.7.  We will hold off on EGD for now and reassess tomorrow morning. Kayexalate has been ordered per hospitalist.   Anemia: Worsening anemia on admission, with Hgb 9.1 on admission and previously 11.1 in Nov 2021.  Hemoglobin drifted down to 8.0 on 4/2, but improved to 10.3 on 4/4 without intervention.  Ferritin 12, iron 27 (L), saturation 7% (L). No overt GI bleeding. Anemia likely multifactorial in setting of PUD and gastric bypass.  She would likely benefit from IV iron moving forward.   Plan: 1.  PPI BID. 2.  Carafate QID 3.  Continue to monitor H/H and for overt GI bleeding.  4.  EGD this admission once stable from cardiopulmonary standpoint and K is corrected.  5.  Clear liquids today.  6.  Reassess tomorrow morning for readiness for EGD.  7.  She would likely benefit from IV iron moving forward.   LOS: 4 days    06/16/2020, 7:32 AM   Ermalinda Memos, PA-C [facility]

## 2020-06-16 NOTE — Progress Notes (Addendum)
Progress Note  Patient Name: Brenda Hopkins Date of Encounter: 06/16/2020  [facility] Cardiologist: Dina Rich, MD  Subjective   Says she "hurts all over" this morning. Specifically states her head, back and abdomen hurt. No specific chest pain. Feels as if her breathing has improved.   Inpatient Medications    Scheduled Meds: . amLODipine  10 mg Oral Daily  . atorvastatin  20 mg Oral QHS  . enoxaparin (LOVENOX) injection  40 mg Subcutaneous Q24H  . furosemide  40 mg Intravenous BID  . gabapentin  600 mg Oral TID  . lisinopril  10 mg Oral Daily  . pantoprazole  40 mg Oral BID  . QUEtiapine  200 mg Oral QHS  . rOPINIRole  3 mg Oral QHS  . sodium chloride flush  3 mL Intravenous Q12H  . sucralfate  1 g Oral TID with meals   Continuous Infusions: . sodium chloride     PRN Meds: sodium chloride, acetaminophen, alum & mag hydroxide-simeth, nitroGLYCERIN, ondansetron (ZOFRAN) IV, oxyCODONE, sodium chloride flush   Vital Signs    Vitals:   06/15/20 2005 06/16/20 0048 06/16/20 0219 06/16/20 0500  BP: 126/64 (!) 100/58 (!) 144/75   Pulse: 81 85    Resp: 18 19    Temp: 98.4 F (36.9 C) 97.8 F (36.6 C)    TempSrc: Oral Oral    SpO2: 94% 93%    Weight:    67.3 kg  Height:        Intake/Output Summary (Last 24 hours) at 06/16/2020 0858 Last data filed at 06/16/2020 0829 Gross per 24 hour  Intake 120 ml  Output 2700 ml  Net -2580 ml   Last 3 Weights 06/16/2020 06/15/2020 06/14/2020  Weight (lbs) 148 lb 5.9 oz 150 lb 2.1 oz 151 lb 7.3 oz  Weight (kg) 67.3 kg 68.1 kg 68.7 kg      Telemetry    NSR, HR in 80's to 90's. Episode of NSVT labeled but appears more consistent with a brief narrow-complex tachycardia. - Personally Reviewe  ECG    NSR, HR 84 with TWI along the anterior leads.  - Personally Reviewed  Physical Exam   GEN: Elderly female appearing in acute distress.   Neck: No JVD Cardiac: RRR, no murmurs, rubs, or gallops.  Respiratory: Clear to  auscultation bilaterally. GI: Soft, nontender, non-distended  MS: No lower extremity edema; No deformity. Neuro:  Nonfocal  Psych: Normal affect   Labs    High Sensitivity Troponin:   Recent Labs  Lab 06/12/20 1413 06/12/20 1614 06/12/20 2337 06/13/20 1351 06/13/20 1556  TROPONINIHS 71* 63* 43* 28* 29*      Chemistry Recent Labs  Lab 06/12/20 1413 06/13/20 0606 06/14/20 0553 06/15/20 0441 06/15/20 1204 06/16/20 0454  NA 137   < > 141 137  --  135  K 3.7   < > 4.5 5.8*  --  5.7*  CL 103   < > 99 95*  --  89*  CO2 25   < > 34* 34*  --  36*  GLUCOSE 135*   < > 84 84  --  94  BUN 27*   < > 16 18  --  19  CREATININE 0.89   < > 0.67 0.85  --  1.05*  CALCIUM 8.2*   < > 8.2* 8.1*  --  8.7*  PROT 6.5  --   --   --  6.4*  --   ALBUMIN 3.1*  --   --   --  3.0*  --   AST 31  --   --   --  16  --   ALT 24  --   --   --  15  --   ALKPHOS 109  --   --   --  99  --   BILITOT 0.6  --   --   --  0.6  --   GFRNONAA >60   < > >60 >60  --  55*  ANIONGAP 9   < > 8 8  --  10   < > = values in this interval not displayed.     Hematology Recent Labs  Lab 06/12/20 1413 06/13/20 0606 06/15/20 1204  WBC 8.5 5.0 4.6  RBC 3.45* 3.04* 3.95  HGB 9.1* 8.0* 10.3*  HCT 31.8* 28.0* 36.6  MCV 92.2 92.1 92.7  MCH 26.4 26.3 26.1  MCHC 28.6* 28.6* 28.1*  RDW 17.8* 17.8* 17.3*  PLT 366 284 310    BNP Recent Labs  Lab 06/14/20 0553 06/15/20 0441 06/16/20 0454  BNP 589.0* 282.0* 198.0*     DDimer No results for input(s): DDIMER in the last 168 hours.   Radiology    DG CHEST PORT 1 VIEW  Result Date: 06/15/2020 CLINICAL DATA:  Shortness of breath, hypertension, former smoker EXAM: PORTABLE CHEST 1 VIEW COMPARISON:  Portable exam 0656 hours compared to 06/12/2020 FINDINGS: Enlargement of cardiac silhouette. Mediastinal contours and pulmonary vascularity normal. Atherosclerotic calcification aorta. Bibasilar atelectasis. No acute infiltrate, pleural effusion, or pneumothorax. Bones  demineralized with advanced degenerative changes of the RIGHT glenohumeral joint. IMPRESSION: Enlargement of cardiac silhouette with bibasilar atelectasis. Aortic Atherosclerosis (ICD10-I70.0). Electronically Signed   By: Ulyses Southward M.D.   On: 06/15/2020 08:06   US Abdomen Limited RUQ (LIVER/GB)  Result Date: 06/15/2020 CLINICAL DATA:  Diffuse abdominal pain. 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 EXAM: ULTRASOUND ABDOMEN LIMITED RIGHT UPPER QUADRANT COMPARISON:  Prior CT scan November 2021 FINDINGS: Gallbladder: No gallstones or wall thickening visualized. No sonographic Murphy sign noted by sonographer. Common bile duct: Diameter: Within normal limits at 5-6 mm Liver: No focal lesion identified. Within normal limits in parenchymal echogenicity. Portal vein is patent on color Doppler imaging with normal direction of blood flow towards the liver. Other: None. IMPRESSION: Negative right upper quadrant ultrasound. Electronically Signed   By: Malachy Moan M.D.   On: 06/15/2020 09:55    Cardiac Studies   Echocardiogram: 06/13/2020 IMPRESSIONS    1. Left ventricular ejection fraction, by estimation, is 55 to 60%. The  left ventricle has normal function. The left ventricle has no regional  wall motion abnormalities. There is mild concentric left ventricular  hypertrophy of the basal-septal segment.  Left ventricular diastolic parameters are consistent with Grade I  diastolic dysfunction (impaired relaxation).  2. Right ventricular systolic function is normal. The right ventricular  size is normal. There is mildly elevated pulmonary artery systolic  pressure. The estimated right ventricular systolic pressure is 42.0 mmHg.  3. The mitral valve is grossly normal. No evidence of mitral valve  regurgitation. No evidence of mitral stenosis. Severe mitral annular  calcification.  4. The aortic valve was not well visualized. There is mild calcification  of the  aortic valve. Aortic valve regurgitation is not visualized. No  aortic stenosis is present.  5. The inferior vena cava is dilated in size with <50% respiratory  variability, suggesting right atrial pressure of 15 mmHg.   Comparison(s): A prior study was performed  on 01/16/2020. Left ventricle is  less hyperdynamic.   Patient Profile     80 y.o. female w/ PMH of HTN, gastric bypass and anxiety who presented to [facility] ED on 06/12/2020 for evaluation of worsening dyspnea. Cardiology consulted for CHF and questionable atrial fibrillation.   Assessment & Plan    1. Acute Diastolic CHF Exacerbation - BNP elevated to 1270 on admission and CXR showed ill-defined airspace opacity. CTA showed no evidence of PE but was noted to have cardiomegaly, severe coronary artery calcifications and atelectasis. - She has been receiving IV Lasix 40mg  BID with a recorded output of -9.3L thus far and weight down 8 lbs. BNP improved to 198 when rechecked this AM and creatinine starting to trend up slightly from 0.85 to 1.05. Will reduce Lasix to daily dosing and can likely switch to PO tomorrow (not on diuretic therapy prior to admission).   2. Questionable Atrial Fibrillation - No definitive atrial fibrillation noted on telemetry.One strip labeled as 10 beats NSVT overnight but appears more consistent with narrow-complex tachycardia. K+ at 5.7 this AM and Kayexalate has been administered. Mg stable at 2.1.  3. Coronary Calcification on CT - HS Troponin values have been flat at 71, 63, 43, 28 and 29 this admission and EKG shows TWI along the lateral leads. No reported anginal symptoms. Can discuss possible stress testing as an outpatient pending improvement of her current issues.   4. Anemia - Hgb at 10.3 this morning. GI is planning for EGD this admission for evaluation of her epigastric pain once K+ normalizes. Elevated at 5.7 this AM and K+ supplementation has been held with Kayexalate already administered.     For questions or updates, please contact [facility] Please consult [email or web address] for contact info under        Signed, , PA-C  06/16/2020, 8:58 AM    Attending note Patient seen and discussed with PA 08/16/2020, I agree with her documentation. Admitted with acute on chronic diastolic HF, has diuresed well. Mild uptrend in Cr today, agree with decreasing IV lasix to once daily. Hyperkalemia, can hold lisinopril. SR, PACs, ectopic atrial rhythms has not had clear afib.   Iran Ouch MD

## 2020-06-17 ENCOUNTER — Inpatient Hospital Stay (HOSPITAL_COMMUNITY): Payer: Medicare Other | Admitting: Anesthesiology

## 2020-06-17 ENCOUNTER — Encounter (HOSPITAL_COMMUNITY): Payer: Self-pay | Admitting: Internal Medicine

## 2020-06-17 ENCOUNTER — Encounter (HOSPITAL_COMMUNITY)
Admission: EM | Disposition: A | Discharge status: Home Health | Payer: Self-pay | Source: Home / Self Care | Attending: Internal Medicine

## 2020-06-17 DIAGNOSIS — I10 Essential (primary) hypertension: Secondary | ICD-10-CM | POA: Diagnosis not present

## 2020-06-17 DIAGNOSIS — J9601 Acute respiratory failure with hypoxia: Secondary | ICD-10-CM | POA: Diagnosis not present

## 2020-06-17 DIAGNOSIS — R112 Nausea with vomiting, unspecified: Secondary | ICD-10-CM

## 2020-06-17 DIAGNOSIS — R1013 Epigastric pain: Secondary | ICD-10-CM

## 2020-06-17 DIAGNOSIS — Z980 Intestinal bypass and anastomosis status: Secondary | ICD-10-CM

## 2020-06-17 DIAGNOSIS — I5033 Acute on chronic diastolic (congestive) heart failure: Secondary | ICD-10-CM | POA: Diagnosis not present

## 2020-06-17 DIAGNOSIS — K2289 Other specified disease of esophagus: Secondary | ICD-10-CM

## 2020-06-17 HISTORY — PX: BALLOON DILATION: SHX5330

## 2020-06-17 HISTORY — PX: ESOPHAGOGASTRODUODENOSCOPY (EGD) WITH PROPOFOL: SHX5813

## 2020-06-17 LAB — CULTURE, BLOOD (ROUTINE X 2)
Culture: NO GROWTH
Culture: NO GROWTH
Special Requests: ADEQUATE

## 2020-06-17 LAB — BASIC METABOLIC PANEL
Anion gap: 13 (ref 5–15)
BUN: 19 mg/dL (ref 8–23)
CO2: 35 mmol/L — ABNORMAL HIGH (ref 22–32)
Calcium: 8.7 mg/dL — ABNORMAL LOW (ref 8.9–10.3)
Chloride: 89 mmol/L — ABNORMAL LOW (ref 98–111)
Creatinine, Ser: 1.16 mg/dL — ABNORMAL HIGH (ref 0.44–1.00)
GFR, Estimated: 49 mL/min — ABNORMAL LOW (ref >60)
Glucose, Bld: 84 mg/dL (ref 70–99)
Potassium: 4.6 mmol/L (ref 3.5–5.1)
Sodium: 137 mmol/L (ref 135–145)

## 2020-06-17 LAB — BRAIN NATRIURETIC PEPTIDE: B Natriuretic Peptide: 167 pg/mL — ABNORMAL HIGH (ref 0.0–100.0)

## 2020-06-17 SURGERY — ESOPHAGOGASTRODUODENOSCOPY (EGD) WITH PROPOFOL
Anesthesia: General

## 2020-06-17 MED ORDER — BUDESONIDE 0.5 MG/2ML IN SUSP
0.5000 mg | Freq: Two times a day (BID) | RESPIRATORY_TRACT | Status: DC
  Administered 2020-06-17 – 2020-06-24 (×13): 0.5 mg via RESPIRATORY_TRACT
  Filled 2020-06-17 (×14): qty 2

## 2020-06-17 MED ORDER — LACTATED RINGERS IV SOLN: INTRAVENOUS | Status: DC

## 2020-06-17 MED ORDER — METOPROLOL TARTRATE 25 MG PO TABS
12.5000 mg | ORAL_TABLET | Freq: Two times a day (BID) | ORAL | Status: DC
  Administered 2020-06-17: 12.5 mg via ORAL
  Filled 2020-06-17: qty 1

## 2020-06-17 MED ORDER — STERILE WATER FOR IRRIGATION IR SOLN
Status: DC | PRN
  Administered 2020-06-17: 100 mL

## 2020-06-17 MED ORDER — PROPOFOL 10 MG/ML IV BOLUS
INTRAVENOUS | Status: DC | PRN
  Administered 2020-06-17: 20 mg via INTRAVENOUS

## 2020-06-17 MED ORDER — SODIUM CHLORIDE 0.9 % IV SOLN: INTRAVENOUS | Status: DC

## 2020-06-17 MED ORDER — FERUMOXYTOL INJECTION 510 MG/17 ML
INTRAVENOUS | Status: AC
  Filled 2020-06-17: qty 17

## 2020-06-17 MED ORDER — IPRATROPIUM-ALBUTEROL 0.5-2.5 (3) MG/3ML IN SOLN
3.0000 mL | Freq: Three times a day (TID) | RESPIRATORY_TRACT | Status: DC
  Administered 2020-06-18 – 2020-06-24 (×18): 3 mL via RESPIRATORY_TRACT
  Filled 2020-06-17 (×20): qty 3

## 2020-06-17 MED ORDER — PROPOFOL 500 MG/50ML IV EMUL
INTRAVENOUS | Status: DC | PRN
  Administered 2020-06-17: 150 ug/kg/min via INTRAVENOUS

## 2020-06-17 MED ORDER — IPRATROPIUM-ALBUTEROL 0.5-2.5 (3) MG/3ML IN SOLN
3.0000 mL | Freq: Three times a day (TID) | RESPIRATORY_TRACT | Status: DC
  Administered 2020-06-17: 3 mL via RESPIRATORY_TRACT
  Filled 2020-06-17: qty 3

## 2020-06-17 MED ORDER — SODIUM CHLORIDE 0.9 % IV SOLN
510.0000 mg | Freq: Once | INTRAVENOUS | Status: AC
  Administered 2020-06-17: 510 mg via INTRAVENOUS
  Filled 2020-06-17: qty 17

## 2020-06-17 MED ORDER — FUROSEMIDE 40 MG PO TABS
40.0000 mg | ORAL_TABLET | Freq: Every day | ORAL | Status: DC
  Administered 2020-06-17: 40 mg via ORAL
  Filled 2020-06-17: qty 1

## 2020-06-17 NOTE — Anesthesia Postprocedure Evaluation (Signed)
Anesthesia Post Note  Patient: Brenda Hopkins  Procedure(s) Performed: ESOPHAGOGASTRODUODENOSCOPY (EGD) WITH PROPOFOL (N/A ) BALLOON DILATION (N/A )  Patient location during evaluation: PACU Anesthesia Type: General Level of consciousness: awake and alert Pain management: pain level controlled Vital Signs Assessment: post-procedure vital signs reviewed and stable Respiratory status: spontaneous breathing Cardiovascular status: blood pressure returned to baseline and stable Postop Assessment: no apparent nausea or vomiting Anesthetic complications: no   No complications documented.   Last Vitals:  Vitals:   06/17/20 0516 06/17/20 0915  BP: 110/70 95/74  Pulse: (!) 45 79  Resp: 18 20  Temp: 36.5 C 36.4 C  SpO2: (!) 88% 94%    Last Pain:  Vitals:   06/17/20 0952  TempSrc:   PainSc: 0-No pain                 Senica Crall

## 2020-06-17 NOTE — Progress Notes (Signed)
Brief EGD note.  Normal hypopharynx Normal mucosa of the esophagus. 4 cm sliding hiatal hernia. Small gastric pouch with normal mucosa. 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 Ulcer size has decreased significantly since last EGD of November 2021. Anastomotic stricture had to be dilated to 12 mm with a balloon before scope could be passed distally. Normal mucosa of jejunum.  40 cm examined.

## 2020-06-17 NOTE — Anesthesia Preprocedure Evaluation (Signed)
Anesthesia Evaluation  Patient identified by MRN, date of birth, ID band Patient awake    Reviewed: Allergy & Precautions, H&P , NPO status , Patient's Chart, lab work & pertinent test results, reviewed documented beta blocker date and time   Airway Mallampati: III  TM Distance: >3 FB Neck ROM: full    Dental no notable dental hx.    Pulmonary neg pulmonary ROS, former smoker,    Pulmonary exam normal breath sounds clear to auscultation       Cardiovascular Exercise Tolerance: Good hypertension, +CHF   Rhythm:regular Rate:Normal     Neuro/Psych PSYCHIATRIC DISORDERS Anxiety negative neurological ROS     GI/Hepatic Neg liver ROS, hiatal hernia, PUD, GERD  Medicated,  Endo/Other  negative endocrine ROS  Renal/GU ARFRenal disease  negative genitourinary   Musculoskeletal   Abdominal   Peds  Hematology  (+) Blood dyscrasia, anemia ,   Anesthesia Other Findings   Reproductive/Obstetrics negative OB ROS                             Anesthesia Physical Anesthesia Plan  ASA: II  Anesthesia Plan: General   Post-op Pain Management:    Induction:   PONV Risk Score and Plan: Propofol infusion  Airway Management Planned:   Additional Equipment:   Intra-op Plan:   Post-operative Plan:   Informed Consent: I have reviewed the patients History and Physical, chart, labs and discussed the procedure including the risks, benefits and alternatives for the proposed anesthesia with the patient or authorized representative who has indicated his/her understanding and acceptance.     Dental Advisory Given  Plan Discussed with: CRNA  Anesthesia Plan Comments:         Anesthesia Quick Evaluation

## 2020-06-17 NOTE — Progress Notes (Addendum)
PROGRESS NOTE  Brenda Hopkins FHL:[id number] DOB: 1940-05-31 DOA: 06/12/2020 PCP: Shawnie Dapper, PA-C  Brief History:   80 y.o. female, with medical history significant forhypertension, anxiety, marginalulcer, tobacco abuseandchronic pain due to complaints of shortness of breath of one week duration.  She reports dyspnea with exertion, and laying flat, as well she does report some epigastric/lower chest pain, does not radiate, as well she does report worsening lower extremity edema.  she was recently seen by her PCP for lower extremity edema which she was started on Lasix for last 4 days, report was told to have A. fib, and referral has been made to cardiologist but she did not get to see them prior to admission.  She has a significant tobacco hx of >50 pack years. In the ED, BNP of 1270, chest x-ray significant for vascular congestion, EKG nonacute, troponins non-ACS pattern 71>63, was given IV Lasix, and [facility] consulted to admit for CHF  Assessment/Plan: Acute on chronic diastolic CHF -Stable, improved, on 2 L of oxygen satting 90% -BNP 1270 >>> 589, 282, 198 - NEG 11 L since admission -Continue IV Lasix 40 mg daily>>po lasix 06/17/20 -Monitoring I's and O's, daily weight -2D echo--ejection fraction 55-60%, G1DD, no WMA, mild elevated PASP -appreciate consulting cardiology -CTAchest--negpulmonary embolis;  cardiomegaly, severe coronary atherosclerosis, aortic atherosclerosis,.. Bilateral sublingual atelectasis, T9 vertebral compression deformity  Acute respiratory failure with hypoxia  -due to CHF in the setting of underlying COPD -stable on 2L -wean for saturation >90%  Questionable Atrial Fibrillation - No definitive atrial fibrillation noted on telemetry. -appreciate cardiology input -One strip labeled as 10 beats NSVT overnight but appears more consistent with narrow-complex tachycardia. K+ was 5.7 on 06/16/20 and Kayexalate has been administered. Mg  stable at 2.1.  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 -Post prandial with every meal developed gastric pain then followed with nausea vomiting -Continue Protonix, Carafate -Consulting gastroenterology >>> appreciate input -Ultrasound right upper quadrant negative for cholelithiasis or cholecystitis  Iron deficiency Anemia -iron saturation 7 -ferritin 12 -feraheme x 1  Hyperlipidemia -continue statin  HTN -continue amlodipine  Chronic pain syndrome -continue gabapentin -continue home dose oxycodone -PMDP  reviewed  Hyperkalemia -improved with kayexalate      Status is: Inpatient  Remains inpatient appropriate because:IV treatments appropriate due to intensity of illness or inability to take PO   Dispo: The patient is from: Home              Anticipated d/c is to: Home              Patient currently is not medically stable to d/c.   Difficult to place patient No        Family Communication:  no Family at bedside  Consultants:  cardiology  Code Status:  FULL   DVT Prophylaxis:  SCDs   Procedures: As Listed in Progress Note Above  Antibiotics: None     Subjective: Patient feels sob is slowly improving.  Denies n/v/d.  Has epigastric pain, unchanged.  No cough or hemoptysis  Objective: Vitals:   06/17/20 1030 06/17/20 1045 06/17/20 1058 06/17/20 1331  BP: (!) 107/57 110/78  (!) 144/69  Pulse: 82 78 76 83  Resp: 14 20 (!) 21 19  Temp:  98.4 F (36.9 C)  TempSrc:    Oral  SpO2: 92% 99% 95% 95%  Weight:      Height:        Intake/Output Summary (Last 24 hours) at 06/17/2020 1733 Last data filed at 06/17/2020 1718 Gross per 24 hour  Intake 950 ml  Output 2650 ml  Net -1700 ml   Weight change: -0.3  kg Exam:   General:  Pt is alert, follows commands appropriately, not in acute distress  HEENT: No icterus, No thrush, No neck mass, [place]/AT  Cardiovascular: RRR, S1/S2, no rubs, no gallops  Respiratory: bibasilar rales.  Minimal bibasilar wheeze  Abdomen: Soft/+BS, non tender, non distended, no guarding  Extremities: trace LE edema, No lymphangitis, No petechiae, No rashes, no synovitis   Data Reviewed: I have personally reviewed following labs and imaging studies Basic Metabolic Panel: Recent Labs  Lab 06/14/20 0553 06/15/20 0441 06/16/20 0454 06/16/20 1344 06/17/20 0648  NA 141 137 135 141 137  K 4.5 5.8* 5.7* 4.8 4.6  CL 99 95* 89* 91* 89*  CO2 34* 34* 36* 36* 35*  GLUCOSE 84 84 94 82 84  BUN 16 18 19 19 19   CREATININE 0.67 0.85 1.05* 1.03* 1.16*  CALCIUM 8.2* 8.1* 8.7* 8.8* 8.7*  MG  --   --  2.1  --   --    Liver Function Tests: Recent Labs  Lab 06/12/20 1413 06/15/20 1204  AST 31 16  ALT 24 15  ALKPHOS 109 99  BILITOT 0.6 0.6  PROT 6.5 6.4*  ALBUMIN 3.1* 3.0*   No results for input(s): LIPASE, AMYLASE in the last 168 hours. No results for input(s): AMMONIA in the last 168 hours. Coagulation Profile: No results for input(s): INR, PROTIME in the last 168 hours. CBC: Recent Labs  Lab 06/12/20 1413 06/13/20 0606 06/15/20 1204 06/16/20 0951  WBC 8.5 5.0 4.6  --   NEUTROABS 6.9 2.9 3.1  --   HGB 9.1* 8.0* 10.3* 9.6*  HCT 31.8* 28.0* 36.6 33.1*  MCV 92.2 92.1 92.7  --   PLT 366 284 310  --    Cardiac Enzymes: No results for input(s): CKTOTAL, CKMB, CKMBINDEX, TROPONINI in the last 168 hours. BNP: Invalid input(s): POCBNP CBG: No results for input(s): GLUCAP in the last 168 hours. HbA1C: No results for input(s): HGBA1C in the last 72 hours. Urine analysis:    Component Value Date/Time   COLORURINE AMBER (A) 01/29/2020 2032   APPEARANCEUR CLOUDY (A) 01/29/2020 2032   LABSPEC 1.025 01/29/2020 2032   PHURINE 5.0 01/29/2020 2032   GLUCOSEU  NEGATIVE 01/29/2020 2032   HGBUR NEGATIVE 01/29/2020 2032   BILIRUBINUR SMALL (A) 01/29/2020 2032   KETONESUR NEGATIVE 01/29/2020 2032   PROTEINUR 30 (A) 01/29/2020 2032   NITRITE NEGATIVE 01/29/2020 2032   LEUKOCYTESUR TRACE (A) 01/29/2020 2032   Sepsis Labs: @LABRCNTIP (procalcitonin:4,lacticidven:4) ) Recent Results (from the past 240 hour(s))  Blood culture (routine x 2)     Status: None   Collection Time: 06/12/20  3:05 PM   Specimen: BLOOD LEFT HAND  Result Value Ref Range Status   Specimen Description BLOOD LEFT HAND  Final   Special Requests   Final    Blood Culture adequate volume BOTTLES DRAWN AEROBIC AND ANAEROBIC   Culture   Final    NO GROWTH 5 DAYS Performed at [facility], [address] [place], [place] [address]    Report Status 06/17/2020 FINAL  Final  Blood culture (routine x 2)  Status: None   Collection Time: 06/12/20  3:05 PM   Specimen: BLOOD RIGHT HAND  Result Value Ref Range Status   Specimen Description BLOOD RIGHT HAND  Final   Special Requests   Final    Blood Culture results may not be optimal due to an inadequate volume of blood received in culture bottles BOTTLES DRAWN AEROBIC AND ANAEROBIC   Culture   Final    NO GROWTH 5 DAYS Performed at [facility], [address], [place], [place] [address]    Report Status 06/17/2020 FINAL  Final  Resp Panel by RT-PCR (Flu A&B, Covid) Nasopharyngeal Swab     Status: None   Collection Time: 06/12/20  3:51 PM   Specimen: Nasopharyngeal Swab; Nasopharyngeal(NP) swabs in vial transport medium  Result Value Ref Range Status   SARS Coronavirus 2 by RT PCR NEGATIVE NEGATIVE Final    Comment: (NOTE) SARS-CoV-2 target nucleic acids are NOT DETECTED.  The SARS-CoV-2 RNA is generally detectable in upper respiratory specimens during the acute phase of infection. The lowest concentration of SARS-CoV-2 viral copies this assay can detect is 138 copies/mL. A negative result does not preclude SARS-Cov-2 infection  and should not be used as the sole basis for treatment or other patient management decisions. A negative result may occur with  improper specimen collection/handling, submission of specimen other than nasopharyngeal swab, presence of viral mutation(s) within the areas targeted by this assay, and inadequate number of viral copies(<138 copies/mL). A negative result must be combined with clinical observations, patient history, and epidemiological information. The expected result is Negative.  Fact Sheet for Patients:  BloggerCourse.com  Fact Sheet for Healthcare Providers:  SeriousBroker.it  This test is no t yet approved or cleared by the [place] FDA and  has been authorized for detection and/or diagnosis of SARS-CoV-2 by FDA under an Emergency Use Authorization (EUA). This EUA will remain  in effect (meaning this test can be used) for the duration of the COVID-19 declaration under Section 564(b)(1) of the Act, 21 U.S.C.section 360bbb-3(b)(1), unless the authorization is terminated  or revoked sooner.       Influenza A by PCR NEGATIVE NEGATIVE Final   Influenza B by PCR NEGATIVE NEGATIVE Final    Comment: (NOTE) The Xpert Xpress SARS-CoV-2/FLU/RSV plus assay is intended as an aid in the diagnosis of influenza from Nasopharyngeal swab specimens and should not be used as a sole basis for treatment. Nasal washings and aspirates are unacceptable for Xpert Xpress SARS-CoV-2/FLU/RSV testing.  Fact Sheet for Patients: BloggerCourse.com  Fact Sheet for Healthcare Providers: SeriousBroker.it  This test is not yet approved or cleared by the [place] FDA and has been authorized for detection and/or diagnosis of SARS-CoV-2 by FDA under an Emergency Use Authorization (EUA). This EUA will remain in effect (meaning this test can be used) for the duration of the COVID-19 declaration  under Section 564(b)(1) of the Act, 21 U.S.C. section 360bbb-3(b)(1), unless the authorization is terminated or revoked.  Performed at [facility], [address], [place], [place] [address]      Scheduled Meds: . amLODipine  10 mg Oral Daily  . atorvastatin  20 mg Oral QHS  . furosemide  40 mg Oral Daily  . gabapentin  600 mg Oral TID  . pantoprazole  40 mg Oral BID  . QUEtiapine  200 mg Oral QHS  . rOPINIRole  3 mg Oral QHS  . sodium chloride flush  3 mL Intravenous Q12H  . sucralfate  1 g Oral  TID with meals   Continuous Infusions: . sodium chloride      Procedures/Studies: DG Chest 1 View  Result Date: 06/12/2020 CLINICAL DATA:  Shortness of breath EXAM: CHEST  1 VIEW COMPARISON:  January 15, 2020 FINDINGS: There is ill-defined airspace opacity in the right upper lobe. There is scarring in the left base. The heart is enlarged with pulmonary vascularity normal, stable. There is aortic atherosclerosis. Bones are osteoporotic. There is extensive arthropathy in the right shoulder. IMPRESSION: Ill-defined airspace opacity consistent with pneumonia right upper lobe. Scarring left base. Stable cardiac enlargement. Bones osteoporotic. Aortic Atherosclerosis (ICD10-I70.0). Electronically Signed   By: Bretta Bang III M.D.   On: 06/12/2020 14:47   CT Angio Chest PE W and/or Wo Contrast  Result Date: 06/12/2020 CLINICAL DATA:  80 year old female with shortness of breath. EXAM: CT ANGIOGRAPHY CHEST WITH CONTRAST TECHNIQUE: Multidetector CT imaging of the chest was performed using the standard protocol during bolus administration of intravenous contrast. Multiplanar CT image reconstructions and MIPs were obtained to evaluate the vascular anatomy. CONTRAST:  Seventy-five mL Omnipaque 350, intravenous COMPARISON:  01/04/2020 FINDINGS: Cardiovascular: Satisfactory opacification of the pulmonary arteries to the segmental level. No evidence of pulmonary embolism. Similar appearing moderate global  cardiomegaly. Mitral annular calcifications. Severe coronary atherosclerotic calcifications. Scattered atherosclerotic calcifications of the thoracic aorta. No pericardial effusion. Mediastinum/Nodes: No enlarged mediastinal, hilar, or axillary lymph nodes. The esophagus is patulous throughout with a standing column of enteric contents. Thyroid gland and trachea demonstrate no significant findings. Lungs/Pleura: Bibasilar and lingular subsegmental atelectasis. Mild upper lobe predominant centrilobular emphysema. No suspicious pulmonary nodules. No pleural effusion or pneumothorax. 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 The visualized upper abdomen is otherwise within normal limits. Musculoskeletal: Progressive compression deformity of the T9 vertebral body with associated kyphosis at this level, now nearly vertebra plana morphology. Mild, unchanged retropulsion. 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 Multilevel chronic, bilateral posterolateral nondisplaced rib fractures. Advanced degenerative changes of the glenohumeral joints bilaterally. No acute osseous abnormality. Review of the MIP images confirms the above findings. IMPRESSION: Vascular: 1. No evidence of pulmonary embolism. 2. Unchanged moderate global cardiomegaly. 3. Severe coronary atherosclerotic calcifications. 4.  Aortic Atherosclerosis (ICD10-I70.0). Non-Vascular: 1. Bibasilar and lingular subsegmental atelectasis. 2. Progressive compression deformity of the T9 vertebral body, now essentially vertebra plana. Unchanged mild retropulsion. Marliss Coots, MD [facility] Electronically Signed   By: Marliss Coots MD   On: 06/12/2020 16:45   US Venous Img Lower  Left (DVT Study)  Result Date: 06/12/2020 CLINICAL DATA:  Lower extremity pain and edema EXAM: LEFT LOWER EXTREMITY VENOUS  DUPLEX ULTRASOUND TECHNIQUE: Gray-scale sonography with graded compression, as well as color Doppler and duplex ultrasound were performed to evaluate the left lower extremity deep venous system from the level of the common femoral vein and including the common femoral, femoral, profunda femoral, popliteal and calf veins including the posterior tibial, peroneal and gastrocnemius veins when visible. The superficial great saphenous vein was also interrogated. Spectral Doppler was utilized to evaluate flow at rest and with distal augmentation maneuvers in the common femoral, femoral and popliteal veins. COMPARISON:  None. FINDINGS: Contralateral Common Femoral Vein: Respiratory phasicity is normal and symmetric with the symptomatic side. No evidence of thrombus. Normal compressibility. Common Femoral Vein: No evidence of thrombus. Normal compressibility, respiratory phasicity and response to augmentation. Saphenofemoral Junction: No evidence of thrombus. Normal compressibility and flow on color Doppler imaging.  Profunda Femoral Vein: No evidence of thrombus. Normal compressibility and flow on color Doppler imaging. Femoral Vein: No evidence of thrombus. Normal compressibility, respiratory phasicity and response to augmentation. Popliteal Vein: No evidence of thrombus. Normal compressibility, respiratory phasicity and response to augmentation. Calf Veins: No evidence of thrombus. Normal compressibility and flow on color Doppler imaging. Superficial Great Saphenous Vein: No evidence of thrombus. Normal compressibility. Venous Reflux:  None. Other Findings:  Soft tissue edema noted in the calf region. IMPRESSION: No evidence of deep venous thrombosis in the left lower extremity. Right common femoral vein patent. There is left calf region soft tissue edema. Electronically Signed   By: Bretta Bang III M.D.   On: 06/12/2020 14:46   DG CHEST PORT 1 VIEW  Result Date: 06/15/2020 CLINICAL DATA:  Shortness of breath,  hypertension, former smoker EXAM: PORTABLE CHEST 1 VIEW COMPARISON:  Portable exam 0656 hours compared to 06/12/2020 FINDINGS: Enlargement of cardiac silhouette. Mediastinal contours and pulmonary vascularity normal. Atherosclerotic calcification aorta. Bibasilar atelectasis. No acute infiltrate, pleural effusion, or pneumothorax. Bones demineralized with advanced degenerative changes of the RIGHT glenohumeral joint. IMPRESSION: Enlargement of cardiac silhouette with bibasilar atelectasis. Aortic Atherosclerosis (ICD10-I70.0). Electronically Signed   By: Ulyses Southward M.D.   On: 06/15/2020 08:06   ECHOCARDIOGRAM COMPLETE  Result Date: 06/13/2020    ECHOCARDIOGRAM REPORT   Patient Name:   KAYLAN YATES Date of Exam: 06/13/2020 Medical Rec #:  [id number]           Height:       64.0 in Accession #:    [id number]          Weight:       157.0 lb Date of Birth:  May 20, 1940           BSA:          1.765 m Patient Age:    79 years            BP:           108/55 mmHg Patient Gender: F                   HR:           91 bpm. [facility] Procedure: 2D Echo Indications:    CHF-Acute Diastolic I50.31  History:        Patient has prior history of Echocardiogram examinations, most                 recent 01/16/2020. Risk Factors:Hypertension, Dyslipidemia and                 Former Smoker.  Sonographer:    Jeryl Columbia RDCS (AE) Referring Phys: [id number] [address] IMPRESSIONS  1. Left ventricular ejection fraction, by estimation, is 55 to 60%. The left ventricle has normal function. The left ventricle has no regional wall motion abnormalities. There is mild concentric left ventricular hypertrophy of the basal-septal segment. Left ventricular diastolic parameters are consistent with Grade I diastolic dysfunction (impaired relaxation).  2. Right ventricular systolic function is normal. The right ventricular size is normal. There is mildly elevated pulmonary artery systolic pressure. The estimated right  ventricular systolic pressure is 42.0 mmHg.  3. The mitral valve is grossly normal. No evidence of mitral valve regurgitation. No evidence of mitral stenosis. Severe mitral annular calcification.  4. The aortic valve was not well visualized. There is mild calcification of the aortic valve. Aortic valve regurgitation is not visualized. No  aortic stenosis is present.  5. The inferior vena cava is dilated in size with <50% respiratory variability, suggesting right atrial pressure of 15 mmHg. Comparison(s): A prior study was performed on 01/16/2020. Left ventricle is less hyperdynamic. FINDINGS  Left Ventricle: Left ventricular ejection fraction, by estimation, is 55 to 60%. The left ventricle has normal function. The left ventricle has no regional wall motion abnormalities. The left ventricular internal cavity size was normal in size. There is  mild concentric left ventricular hypertrophy of the basal-septal segment. Left ventricular diastolic parameters are consistent with Grade I diastolic dysfunction (impaired relaxation). Right Ventricle: The right ventricular size is normal. No increase in right ventricular wall thickness. Right ventricular systolic function is normal. There is mildly elevated pulmonary artery systolic pressure. The tricuspid regurgitant velocity is 2.60  m/s, and with an assumed right atrial pressure of 15 mmHg, the estimated right ventricular systolic pressure is 42.0 mmHg. Left Atrium: Left atrial size was normal in size. Right Atrium: Right atrial size was normal in size. Pericardium: There is no evidence of pericardial effusion. Mitral Valve: The mitral valve is grossly normal. There is mild thickening of the mitral valve leaflet(s). There is moderate calcification of the mitral valve leaflet(s). Severe mitral annular calcification. No evidence of mitral valve regurgitation. No evidence of mitral valve stenosis. MV peak gradient, 6.7 mmHg. The mean mitral valve gradient is 3.0 mmHg with  average heart rate of 98 bpm. Tricuspid Valve: The tricuspid valve is grossly normal. Tricuspid valve regurgitation is trivial. No evidence of tricuspid stenosis. Aortic Valve: The aortic valve was not well visualized. There is mild calcification of the aortic valve. Aortic valve regurgitation is not visualized. No aortic stenosis is present. Pulmonic Valve: The pulmonic valve was not well visualized. Pulmonic valve regurgitation is not visualized. No evidence of pulmonic stenosis. Aorta: The aortic root is normal in size and structure. Venous: The inferior vena cava is dilated in size with less than 50% respiratory variability, suggesting right atrial pressure of 15 mmHg. IAS/Shunts: The atrial septum is grossly normal.  LEFT VENTRICLE PLAX 2D LVIDd:         4.18 cm  Diastology LVIDs:         2.99 cm  LV e' medial:    5.00 cm/s LV PW:         1.40 cm  LV E/e' medial:  18.3 LV IVS:        1.35 cm  LV e' lateral:   4.24 cm/s LVOT diam:     2.10 cm  LV E/e' lateral: 21.5 LVOT Area:     3.46 cm  RIGHT VENTRICLE RV S prime:     14.30 cm/s TAPSE (M-mode): 2.4 cm LEFT ATRIUM             Index       RIGHT ATRIUM           Index LA diam:        4.30 cm 2.44 cm/m  RA Area:     14.40 cm LA Vol (A2C):   52.3 ml 29.63 ml/m RA Volume:   35.60 ml  20.17 ml/m LA Vol (A4C):   48.3 ml 27.37 ml/m LA Biplane Vol: 52.1 ml 29.52 ml/m   AORTA Ao Root diam: 2.50 cm MITRAL VALVE                TRICUSPID VALVE MV Area (PHT): 3.36 cm     TR Peak grad:   27.0 mmHg MV Peak grad:  6.7 mmHg     TR Vmax:        260.00 cm/s MV Mean grad:  3.0 mmHg MV Vmax:       1.29 m/s     SHUNTS MV Vmean:      87.5 cm/s    Systemic Diam: 2.10 cm MV Decel Time: 226 msec MV E velocity: 91.30 cm/s MV A velocity: 120.00 cm/s MV E/A ratio:  0.76 Riley Lam MD Electronically signed by Riley Lam MD Signature Date/Time: 06/13/2020/3:04:50 PM    Final    US Abdomen Limited RUQ (LIVER/GB)  Result Date: 06/15/2020 CLINICAL DATA:  Diffuse  abdominal pain. 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 EXAM: ULTRASOUND ABDOMEN LIMITED RIGHT UPPER QUADRANT COMPARISON:  Prior CT scan November 2021 FINDINGS: Gallbladder: No gallstones or wall thickening visualized. No sonographic Murphy sign noted by sonographer. Common bile duct: Diameter: Within normal limits at 5-6 mm Liver: No focal lesion identified. Within normal limits in parenchymal echogenicity. Portal vein is patent on color Doppler imaging with normal direction of blood flow towards the liver. Other: None. IMPRESSION: Negative right upper quadrant ultrasound. Electronically Signed   By: Malachy Moan M.D.   On: 06/15/2020 09:55    Catarina Hartshorn, DO  [facility]  If 7PM-7AM, please contact night-coverage [email or web address] Password [email or web address] 06/17/2020, 5:33 PM   LOS: 5 days

## 2020-06-17 NOTE — Op Note (Signed)
[facility] Patient Name: Brenda Hopkins Procedure Date: 06/17/2020 9:28 AM MRN: [id number] Date of Birth: September 30, 1940 Attending MD: Lionel December , MD CSN: [id number] Age: 80 Admit Type: Inpatient Procedure:                Upper GI endoscopy Indications:              Epigastric abdominal pain, Nausea with vomiting Providers:                Lionel December, MD, Edrick Kins, RN, Edythe Clarity,                            Technician Referring MD:             Catarina Hartshorn, DO Medicines:                Propofol per Anesthesia Complications:            No immediate complications. Estimated Blood Loss:     Estimated blood loss was minimal. Procedure:                Pre-Anesthesia Assessment:                           - Prior to the procedure, a History and Physical                            was performed, and patient medications and                            allergies were reviewed. The patient's tolerance of                            previous anesthesia was also reviewed. The risks                            and benefits of the procedure and the sedation                            options and risks were discussed with the patient.                            All questions were answered, and informed consent                            was obtained. Prior Anticoagulants: The patient has                            taken no previous anticoagulant or antiplatelet                            agents except for NSAID medication. ASA Grade                            Assessment: III - A patient with severe systemic  disease. After reviewing the risks and benefits,                            the patient was deemed in satisfactory condition to                            undergo the procedure.                           After obtaining informed consent, the endoscope was                            passed under direct vision. Throughout the                            procedure, the  patient's blood pressure, pulse, and                            oxygen saturations were monitored continuously. The                            GIF-H190 ([id number] scope was introduced through the                            mouth, and advanced to the jejunum. The upper GI                            endoscopy was accomplished without difficulty. The                            patient tolerated the procedure well. Scope In: 10:02:27 AM Scope Out: 10:12:26 AM Total Procedure Duration: 0 hours 9 minutes 59 seconds  Findings:      The hypopharynx was normal.      The examined esophagus was normal.      The Z-line was irregular and was found 33 cm from the incisors.      A 4 cm hiatal hernia was present.      Evidence of a Roux-en-Y gastrojejunostomy was found. The gastrojejunal       anastomosis was characterized by severe stenosis and ulceration. This       was traversed after dilation. The pouch-to-jejunum limb was       characterized by healthy appearing mucosa.      The examined jejunum was normal.      There is no endoscopic evidence of stenosis in the anastomosis.       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                           - Normal esophagus.                           - Z-line irregular, 33 cm from the incisors.                           -  4 cm hiatal hernia.                           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 Stricture dilated to 12 mm with balloon                            dilator.                           - Normal examined jejunum.                           - No specimens collected. Moderate Sedation:      Per Anesthesia Care Recommendation:           - Return patient to hospital ward for ongoing care.                           - Full liquid diet today.                            - Continue present medications.                           - No anticoagulants for 72 hours.                           - No aspirin, ibuprofen, naproxen, or other                            non-steroidal anti-inflammatory drugs.                           - Repeat upper endoscopy in 1 month. Procedure Code(s):        --- Professional ---                           [phone], Esophagogastroduodenoscopy, flexible,                            transoral; diagnostic, including collection of                            specimen(s) by brushing or washing, when performed                            (separate procedure) Diagnosis Code(s):        --- Professional ---                           K22.8, Other specified diseases of esophagus  K44.9, Diaphragmatic hernia without obstruction or                            gangrene                           Z98.0, Intestinal bypass and anastomosis status                           R10.13, Epigastric pain                           R11.2, Nausea with vomiting, unspecified CPT copyright 2019 [employer]. All rights reserved. The codes documented in this report are preliminary and upon coder review may  be revised to meet current compliance requirements. Lionel December, MD Lionel December, MD 06/17/2020 10:25:35 AM This report has been signed electronically. Number of Addenda: 0

## 2020-06-17 NOTE — Progress Notes (Signed)
EGD findings reviewed with patient at bedside. Patient advised to refrain from using NSAIDs.

## 2020-06-17 NOTE — Transfer of Care (Signed)
Immediate Anesthesia Transfer of Care Note  Patient: Lamara Brecht  Procedure(s) Performed: ESOPHAGOGASTRODUODENOSCOPY (EGD) WITH PROPOFOL (N/A ) BALLOON DILATION (N/A )  Patient Location: PACU  Anesthesia Type:General  Level of Consciousness: awake  Airway & Oxygen Therapy: Patient Spontanous Breathing and Patient connected to nasal cannula oxygen  Post-op Assessment: Report given to RN  Post vital signs: Reviewed and stable  Last Vitals:  Vitals Value Taken Time  BP    Temp    Pulse 82 06/17/20 1025  Resp 18 06/17/20 1025  SpO2 93 % 06/17/20 1025  Vitals shown include unvalidated device data.  Last Pain:  Vitals:   06/17/20 0952  TempSrc:   PainSc: 0-No pain      Patients Stated Pain Goal: 2 (06/16/20 1642)  Complications: No complications documented.

## 2020-06-17 NOTE — Addendum Note (Signed)
Addendum  created 06/17/20 1056 by Moshe Salisbury, CRNA   Charge Capture section accepted

## 2020-06-17 NOTE — Progress Notes (Signed)
Subjective:  Patient continues to complain of epigastric pain.  She states her symptoms started about a week ago.  She has had nausea and vomiting but no hematemesis.  No history of melena either.  Current Medications:  Current Facility-Administered Medications:  .  [MAR Hold] 0.9 %  sodium chloride infusion, 250 mL, Intravenous, PRN, Elgergawy, Dawood S, MD .  0.9 %  sodium chloride infusion, , Intravenous, Continuous, Montez Morita, Daniel, MD .  Doug Sou Hold] acetaminophen (TYLENOL) tablet 650 mg, 650 mg, Oral, Q4H PRN, Elgergawy, Silver Huguenin, MD, 650 mg at 06/16/20 0505 .  [MAR Hold] alum & mag hydroxide-simeth (MAALOX/MYLANTA) 200-200-20 MG/5ML suspension 30 mL, 30 mL, Oral, Q4H PRN, Shahmehdi, Seyed A, MD, 30 mL at 06/13/20 1941 .  [MAR Hold] amLODipine (NORVASC) tablet 10 mg, 10 mg, Oral, Daily, Elgergawy, Silver Huguenin, MD, 10 mg at 06/15/20 0911 .  [MAR Hold] atorvastatin (LIPITOR) tablet 20 mg, 20 mg, Oral, QHS, Shahmehdi, Seyed A, MD, 20 mg at 06/16/20 2218 .  [MAR Hold] enoxaparin (LOVENOX) injection 40 mg, 40 mg, Subcutaneous, Q24H, Elgergawy, Silver Huguenin, MD, 40 mg at 06/16/20 2218 .  [MAR Hold] furosemide (LASIX) injection 40 mg, 40 mg, Intravenous, Daily, Strader, [place] M, PA-C .  [MAR Hold] gabapentin (NEURONTIN) capsule 600 mg, 600 mg, Oral, TID, Elgergawy, Silver Huguenin, MD, 600 mg at 06/16/20 2218 .  lactated ringers infusion, , Intravenous, Continuous, Kiel, Coralie Keens, MD, Last Rate: 10 mL/hr at 06/17/20 0930, Continued from Pre-op at 06/17/20 0930 .  [MAR Hold] nitroGLYCERIN (NITROSTAT) SL tablet 0.4 mg, 0.4 mg, Sublingual, Q5 min PRN, Shahmehdi, Seyed A, MD, 0.4 mg at 06/13/20 1332 .  [MAR Hold] ondansetron (ZOFRAN) injection 4 mg, 4 mg, Intravenous, Q6H PRN, Elgergawy, Silver Huguenin, MD, 4 mg at 06/16/20 0842 .  [MAR Hold] oxyCODONE (Oxy IR/ROXICODONE) immediate release tablet 15 mg, 15 mg, Oral, Q6H PRN, Shahmehdi, Seyed A, MD, 15 mg at 06/16/20 1642 .  [MAR Hold] pantoprazole  (PROTONIX) EC tablet 40 mg, 40 mg, Oral, BID, Elgergawy, Silver Huguenin, MD, 40 mg at 06/16/20 2218 .  [MAR Hold] QUEtiapine (SEROQUEL) tablet 200 mg, 200 mg, Oral, QHS, Elgergawy, Silver Huguenin, MD, 200 mg at 06/16/20 2217 .  [MAR Hold] rOPINIRole (REQUIP) tablet 3 mg, 3 mg, Oral, QHS, Elgergawy, Silver Huguenin, MD, 3 mg at 06/16/20 2217 .  simethicone susp in sterile water 1000 mL irrigation, , , PRN, Danny Yackley U, MD, 100 mL at 06/17/20 0926 .  [MAR Hold] sodium chloride flush (NS) 0.9 % injection 3 mL, 3 mL, Intravenous, Q12H, Elgergawy, Silver Huguenin, MD, 3 mL at 06/16/20 2218 .  [MAR Hold] sodium chloride flush (NS) 0.9 % injection 3 mL, 3 mL, Intravenous, PRN, Elgergawy, Silver Huguenin, MD .  Doug Sou Hold] sucralfate (CARAFATE) 1 GM/10ML suspension 1 g, 1 g, Oral, TID with meals, Shahmehdi, Seyed A, MD, 1 g at 06/16/20 1755   Objective: Blood pressure 95/74, pulse 79, temperature 97.6 F (36.4 C), temperature source Oral, resp. rate 20, height 5' 4" (1.626 m), weight 67 kg, SpO2 94 %. Patient is alert and in no acute distress. Conjunctiva is pink. Sclera is nonicteric Oropharyngeal mucosa is is dry. She is edentulous. No neck masses or thyromegaly noted. Cardiac exam with regular rhythm normal S1 and S2. No murmur or gallop noted. Lungs are clear to auscultation. Abdomen is full.  She has ventral hernia in upper midline which is reducible.  She has laparoscopy scars from previous surgeries.  Abdomen is soft mild generalized tenderness.  No guarding or rebound. No LE edema or clubbing noted.  Labs/studies Results:  CBC Latest Ref Rng & Units 06/16/2020 06/15/2020 06/13/2020  WBC 4.0 - 10.5 K/uL - 4.6 5.0  Hemoglobin 12.0 - 15.0 g/dL 9.6(L) 10.3(L) 8.0(L)  Hematocrit 36.0 - 46.0 % 33.1(L) 36.6 28.0(L)  Platelets 150 - 400 K/uL - 310 284    CMP Latest Ref Rng & Units 06/17/2020 06/16/2020 06/16/2020  Glucose 70 - 99 mg/dL 84 82 94  BUN 8 - 23 mg/dL _0 Creatinine 0.44 - 1.00 mg/dL 1.16(H) 1.03(H) 1.05(H)   Sodium 135 - 145 mmol/L 137 141 135  Potassium 3.5 - 5.1 mmol/L 4.6 4.8 5.7(H)  Chloride 98 - 111 mmol/L 89(L) 91(L) 89(L)  CO2 22 - 32 mmol/L 35(H) 36(H) 36(H)  Calcium 8.9 - 10.3 mg/dL 8.7(L) 8.8(L) 8.7(L)  Total Protein 6.5 - 8.1 g/dL - - -  Total Bilirubin 0.3 - 1.2 mg/dL - - -  Alkaline Phos 38 - 126 U/L - - -  AST 15 - 41 U/L - - -  ALT 0 - 44 U/L - - -    Hepatic Function Latest Ref Rng & Units 06/15/2020 06/12/2020 01/30/2020  Total Protein 6.5 - 8.1 g/dL 6.4(L) 6.5 6.3(L)  Albumin 3.5 - 5.0 g/dL 3.0(L) 3.1(L) 2.7(L)  AST 15 - 41 U/L 16 31 8(L)  ALT 0 - 44 U/L _1 Alk Phosphatase 38 - 126 U/L 99 109 87  Total Bilirubin 0.3 - 1.2 mg/dL 0.6 0.6 0.7  Bilirubin, Direct 0.0 - 0.2 mg/dL 0.1 - -     Assessment:  #1.  Epigastric pain nausea and vomiting in a patient with history of anastomotic ulcer.  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  Last EGD was in November 2021 revealing anastomotic ulcer. With persistent symptoms need to find out if she has persistent ulcer or if she has developed anastomotic stricture.  #2.  Anemia.  No evidence of GI bleed.  #3.  Kidney injury possibly acute.  Serum creatinine is higher today than it was yesterday.  Plan:  Proceed with diagnostic esophagogastroduodenoscopy.

## 2020-06-17 NOTE — Progress Notes (Signed)
From cardiac standpoint she has diuresed well, mild uptrend in Cr today. We will transition to oral lasix 40mg  daily. Remain off ACEI due to hyperkalemia. We will sign off inpatient care and arrange outpatient f/u  MD

## 2020-06-18 ENCOUNTER — Inpatient Hospital Stay (HOSPITAL_COMMUNITY): Payer: Medicare Other

## 2020-06-18 DIAGNOSIS — N179 Acute kidney failure, unspecified: Secondary | ICD-10-CM | POA: Diagnosis not present

## 2020-06-18 DIAGNOSIS — G9341 Metabolic encephalopathy: Secondary | ICD-10-CM

## 2020-06-18 DIAGNOSIS — J9601 Acute respiratory failure with hypoxia: Secondary | ICD-10-CM | POA: Diagnosis not present

## 2020-06-18 DIAGNOSIS — Z515 Encounter for palliative care: Secondary | ICD-10-CM

## 2020-06-18 DIAGNOSIS — I5033 Acute on chronic diastolic (congestive) heart failure: Secondary | ICD-10-CM | POA: Diagnosis not present

## 2020-06-18 DIAGNOSIS — K921 Melena: Secondary | ICD-10-CM

## 2020-06-18 DIAGNOSIS — Z7189 Other specified counseling: Secondary | ICD-10-CM | POA: Diagnosis not present

## 2020-06-18 DIAGNOSIS — R109 Unspecified abdominal pain: Secondary | ICD-10-CM

## 2020-06-18 LAB — COMPREHENSIVE METABOLIC PANEL
ALT: 15 U/L (ref 0–44)
AST: 23 U/L (ref 15–41)
Albumin: 2.7 g/dL — ABNORMAL LOW (ref 3.5–5.0)
Alkaline Phosphatase: 157 U/L — ABNORMAL HIGH (ref 38–126)
Anion gap: 14 (ref 5–15)
BUN: 27 mg/dL — ABNORMAL HIGH (ref 8–23)
CO2: 30 mmol/L (ref 22–32)
Calcium: 8.7 mg/dL — ABNORMAL LOW (ref 8.9–10.3)
Chloride: 87 mmol/L — ABNORMAL LOW (ref 98–111)
Creatinine, Ser: 1.74 mg/dL — ABNORMAL HIGH (ref 0.44–1.00)
GFR, Estimated: 30 mL/min — ABNORMAL LOW (ref >60)
Glucose, Bld: 182 mg/dL — ABNORMAL HIGH (ref 70–99)
Potassium: 5.2 mmol/L — ABNORMAL HIGH (ref 3.5–5.1)
Sodium: 131 mmol/L — ABNORMAL LOW (ref 135–145)
Total Bilirubin: 1.3 mg/dL — ABNORMAL HIGH (ref 0.3–1.2)
Total Protein: 5.3 g/dL — ABNORMAL LOW (ref 6.5–8.1)

## 2020-06-18 LAB — CBC
HCT: 32.5 % — ABNORMAL LOW (ref 36.0–46.0)
Hemoglobin: 9.8 g/dL — ABNORMAL LOW (ref 12.0–15.0)
MCH: 26.3 pg (ref 26.0–34.0)
MCHC: 30.2 g/dL (ref 30.0–36.0)
MCV: 87.1 fL (ref 80.0–100.0)
Platelets: 284 10*3/uL (ref 150–400)
RBC: 3.73 MIL/uL — ABNORMAL LOW (ref 3.87–5.11)
RDW: 17.6 % — ABNORMAL HIGH (ref 11.5–15.5)
WBC: 18.8 10*3/uL — ABNORMAL HIGH (ref 4.0–10.5)
nRBC: 0 % (ref 0.0–0.2)

## 2020-06-18 LAB — FOLATE: Folate: 11.3 ng/mL (ref >5.9)

## 2020-06-18 LAB — CBC WITH DIFFERENTIAL/PLATELET
Abs Immature Granulocytes: 0.09 10*3/uL — ABNORMAL HIGH (ref 0.00–0.07)
Basophils Absolute: 0.1 10*3/uL (ref 0.0–0.1)
Basophils Relative: 0 %
Eosinophils Absolute: 0 10*3/uL (ref 0.0–0.5)
Eosinophils Relative: 0 %
HCT: 34.5 % — ABNORMAL LOW (ref 36.0–46.0)
Hemoglobin: 10.2 g/dL — ABNORMAL LOW (ref 12.0–15.0)
Immature Granulocytes: 1 %
Lymphocytes Relative: 2 %
Lymphs Abs: 0.4 10*3/uL — ABNORMAL LOW (ref 0.7–4.0)
MCH: 26.4 pg (ref 26.0–34.0)
MCHC: 29.6 g/dL — ABNORMAL LOW (ref 30.0–36.0)
MCV: 89.1 fL (ref 80.0–100.0)
Monocytes Absolute: 1.6 10*3/uL — ABNORMAL HIGH (ref 0.1–1.0)
Monocytes Relative: 8 %
Neutro Abs: 17.3 10*3/uL — ABNORMAL HIGH (ref 1.7–7.7)
Neutrophils Relative %: 89 %
Platelets: 299 10*3/uL (ref 150–400)
RBC: 3.87 MIL/uL (ref 3.87–5.11)
RDW: 17.6 % — ABNORMAL HIGH (ref 11.5–15.5)
WBC: 19.5 10*3/uL — ABNORMAL HIGH (ref 4.0–10.5)
nRBC: 0 % (ref 0.0–0.2)

## 2020-06-18 LAB — BLOOD GAS, ARTERIAL
Acid-Base Excess: 9.8 mmol/L — ABNORMAL HIGH (ref 0.0–2.0)
Bicarbonate: 33 mmol/L — ABNORMAL HIGH (ref 20.0–28.0)
FIO2: 36
O2 Saturation: 87.7 %
Patient temperature: 37
pCO2 arterial: 48.4 mmHg — ABNORMAL HIGH (ref 32.0–48.0)
pH, Arterial: 7.461 — ABNORMAL HIGH (ref 7.350–7.450)
pO2, Arterial: 56.8 mmHg — ABNORMAL LOW (ref 83.0–108.0)

## 2020-06-18 LAB — GLUCOSE, CAPILLARY
Glucose-Capillary: 158 mg/dL — ABNORMAL HIGH (ref 70–99)
Glucose-Capillary: 180 mg/dL — ABNORMAL HIGH (ref 70–99)
Glucose-Capillary: 176 mg/dL — ABNORMAL HIGH (ref 70–99)
Glucose-Capillary: 118 mg/dL — ABNORMAL HIGH (ref 70–99)
Glucose-Capillary: 108 mg/dL — ABNORMAL HIGH (ref 70–99)

## 2020-06-18 LAB — URINALYSIS, COMPLETE (UACMP) WITH MICROSCOPIC
Bacteria, UA: NONE SEEN
Bilirubin Urine: NEGATIVE
Glucose, UA: NEGATIVE mg/dL
Ketones, ur: NEGATIVE mg/dL
Leukocytes,Ua: NEGATIVE
Nitrite: NEGATIVE
Protein, ur: NEGATIVE mg/dL
Specific Gravity, Urine: 1.01 (ref 1.005–1.030)
pH: 5 (ref 5.0–8.0)

## 2020-06-18 LAB — TYPE AND SCREEN
ABO/RH(D): O POS
Antibody Screen: NEGATIVE

## 2020-06-18 LAB — VITAMIN B12: Vitamin B-12: 205 pg/mL (ref 180–914)

## 2020-06-18 LAB — T4, FREE: Free T4: 1.08 ng/dL (ref 0.61–1.12)

## 2020-06-18 LAB — AMMONIA: Ammonia: 23 umol/L (ref 9–35)

## 2020-06-18 LAB — PROCALCITONIN: Procalcitonin: 5.85 ng/mL

## 2020-06-18 LAB — TSH: TSH: 1.011 u[IU]/mL (ref 0.350–4.500)

## 2020-06-18 MED ORDER — SODIUM CHLORIDE 0.9 % IV SOLN
2.0000 g | INTRAVENOUS | Status: DC
  Administered 2020-06-18 – 2020-06-21 (×4): 2 g via INTRAVENOUS
  Filled 2020-06-18 (×4): qty 2

## 2020-06-18 MED ORDER — VANCOMYCIN HCL IN DEXTROSE 1-5 GM/200ML-% IV SOLN: 1000.0000 mg | INTRAVENOUS | Status: DC

## 2020-06-18 MED ORDER — SODIUM CHLORIDE 0.9 % IV SOLN
80.0000 mg | Freq: Once | INTRAVENOUS | Status: AC
  Administered 2020-06-18: 80 mg via INTRAVENOUS
  Filled 2020-06-18: qty 80

## 2020-06-18 MED ORDER — SODIUM CHLORIDE 0.9 % IV BOLUS
500.0000 mL | Freq: Once | INTRAVENOUS | Status: AC
  Administered 2020-06-18: 500 mL via INTRAVENOUS

## 2020-06-18 MED ORDER — VANCOMYCIN HCL 1500 MG/300ML IV SOLN
1500.0000 mg | Freq: Once | INTRAVENOUS | Status: AC
  Administered 2020-06-18: 1500 mg via INTRAVENOUS
  Filled 2020-06-18: qty 300

## 2020-06-18 MED ORDER — CHLORHEXIDINE GLUCONATE CLOTH 2 % EX PADS
6.0000 | MEDICATED_PAD | Freq: Every day | CUTANEOUS | Status: DC
  Administered 2020-06-18 – 2020-06-24 (×5): 6 via TOPICAL

## 2020-06-18 MED ORDER — HALOPERIDOL LACTATE 5 MG/ML IJ SOLN
2.0000 mg | Freq: Once | INTRAMUSCULAR | Status: AC
  Administered 2020-06-18: 2 mg via INTRAMUSCULAR
  Filled 2020-06-18: qty 1

## 2020-06-18 MED ORDER — SODIUM CHLORIDE 0.9 % IV SOLN
8.0000 mg/h | INTRAVENOUS | Status: AC
  Administered 2020-06-18 – 2020-06-21 (×6): 8 mg/h via INTRAVENOUS
  Filled 2020-06-18 (×9): qty 80

## 2020-06-18 MED ORDER — PANTOPRAZOLE SODIUM 40 MG IV SOLR
40.0000 mg | Freq: Two times a day (BID) | INTRAVENOUS | Status: DC
  Administered 2020-06-21 – 2020-06-24 (×6): 40 mg via INTRAVENOUS
  Filled 2020-06-18 (×7): qty 40

## 2020-06-18 MED ORDER — FENTANYL CITRATE (PF) 100 MCG/2ML IJ SOLN
25.0000 ug | INTRAMUSCULAR | Status: DC | PRN
  Administered 2020-06-18 – 2020-06-22 (×9): 25 ug via INTRAVENOUS
  Filled 2020-06-18 (×9): qty 2

## 2020-06-18 NOTE — Progress Notes (Signed)
Patient transferred to ICU bed 9. Report given to [facility]

## 2020-06-18 NOTE — Progress Notes (Signed)
RAPID RESPONSE 3:40AM  RN called a rapid response due to patient having a continuous dark red-looking stool with a BP of 80/46, patient was placed in a Trendelenburg position.  IV NS bolus of 500 mL was started.  At bedside, patient was sleepy, but was easily arousable.  FOBT was done and was positive.  CBC, BMP was started. She was started on IV Protonix drip, Lovenox was stopped.  Repeat BP was 94/58 (MAP 69) Patient was transferred to ICU for closer monitoring. H/H done was 10.2/34.5 (this was 9.6/33.1).  EGD done by Dr. 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   Dr. Karilyn Cota will be notified of overnight GI bleed.

## 2020-06-18 NOTE — Progress Notes (Signed)
Pharmacy Antibiotic Note  Brenda Hopkins is a 80 y.o. female admitted on 06/12/2020 with sepsis.  Pharmacy has been consulted for Vancomycin and Cefepime dosing.  Plan: Vancomycin 1500 mg IV x 1 dose. Vancomycin 1000 mg IV every 48 hours. Cefepime 2000 mg IV every 24 hours. Monitor labs, c/s, and vanco level as indicated.  Height: 5\' 4"  (162.6 cm) Weight: 67 kg (147 lb 11.3 oz) IBW/kg (Calculated) : 54.7  Temp (24hrs), Avg:98.2 F (36.8 C), Min:97.4 F (36.3 C), Max:99 F (37.2 C)  Recent Labs  Lab 06/12/20 1413 06/12/20 1505 06/12/20 1614 06/13/20 0606 06/14/20 0553 06/15/20 0441 06/15/20 1204 06/16/20 0454 06/16/20 1344 06/17/20 0648 06/18/20 0505  WBC 8.5  --   --  5.0  --   --  4.6  --   --   --  19.5*  CREATININE 0.89  --   --  0.75   < > 0.85  --  1.05* 1.03* 1.16* 1.74*  LATICACIDVEN  --  1.0 1.2  --   --   --   --   --   --   --   --    < > = values in this interval not displayed.    Estimated Creatinine Clearance: 25.5 mL/min (A) (by C-G formula based on SCr of 1.74 mg/dL (H)).    Allergies  Allergen Reactions  . Prozac [Fluoxetine Hcl] Other (See Comments)    Makes her fell crazy  . Zoloft [Sertraline Hcl]     Made her feel crazy--10/2013  . Nicoderm [Nicotine] Rash    Site rash    Antimicrobials this admission: Vanco 4/7 >>  Cefepime 4/7 >>    Microbiology results: 4/7 BCx: pending 4/7 UCx: pending    Thank you for allowing pharmacy to be a part of this patient's care.  6/7 06/18/2020 11:32 AM

## 2020-06-18 NOTE — Progress Notes (Addendum)
Labs from today reviewed -UA--no pyuria -CXR--bibasilar atelectasis/opacities -CT brain--hypodensity in occipital lobe -PCT 5.85 -B12--205 -ammonia 23 -folate 11.3 -TSH 1.011 -had meeting with daughter at bedside around 1330 and she was updated--pt was more awake at this time but still altered compared to 4/6 -discussed GOC and consulted palliative -patient now DNR -foley inserted for urine retention, 1L out -appreciate GI input -am labs orders -CT abd 4/8 if no improvement -MR brain ordered, but doubt that an incidental small stroke would account for such mental status change -continue empiric vanc/cefepime

## 2020-06-18 NOTE — Progress Notes (Signed)
Attempted to call family to inform them that patient is being transferred to ICU. Left message at number in chart [phone] [facility].

## 2020-06-18 NOTE — Progress Notes (Signed)
Radiology called and reported head CT results to this RN. Those results were Small focus of cortical hypodensity in the right occipital lobe, cannot exclude acute/subacute infarct. No acute intracranial hemorrhage. MRI brain without and with IV contrast could be considered for further evaluation as clinically warranted. MD notified.

## 2020-06-18 NOTE — Progress Notes (Signed)
Daughter informed that patient has moved to ICU bed 9

## 2020-06-18 NOTE — Progress Notes (Signed)
PROGRESS NOTE  Brenda Hopkins WUJ:[id number] DOB: 1941/02/01 DOA: 06/12/2020 PCP: Shawnie Dapper, PA-C  Brief History:  80 y.o.female,with medical history significant forhypertension, anxiety, marginalulcer, tobacco abuseandchronic paindue to complaints of shortness of breath of one week duration.  She reports dyspnea with exertion, and laying flat, as well she does report some epigastric/lower chest pain, does not radiate, as well she does report worsening lower extremity edema.  she was recently seen by her PCP for lower extremity edema which she was started on Lasix for last 4 days, report was told to have A. fib, and referral has been made to cardiologist but she did not get to see them prior to admission.  She has a significant tobacco hx of >50 pack years. In the ED, BNP of 1270, chest x-ray significant for vascular congestion, EKG nonacute, troponins non-ACS pattern 71>63,was given IV Lasix, and [facility] consulted to admit for CHF  Assessment/Plan: Acute metabolic Encephalopathy -06/18/20 patient is confused -check ABG -CBG--176 -EKG -CXR -ammonia -check PCT -d/c gabapentin, seroquel. Requip, oxycodoone -CT brain  Acute on chronic diastolic CHF -Stable, improved, on 2 L of oxygen satting 90% -BNP 1270 >>>589, 282, 198 -NEG 11L since admission -Continue IV Lasix40 mg daily>>po lasix 06/17/20 -Monitoring I's and O's, daily weight -2D echo--ejection fraction 55-60%, G1DD, no WMA, mild elevated PASP -appreciate consulting cardiology -CTAchest--negpulmonary embolis;  cardiomegaly, severe coronary atherosclerosis, aortic atherosclerosis,.. Bilateral sublingual atelectasis, T9 vertebral compression deformity  Acute respiratory failure with hypoxia  -due to CHF in the setting of underlying COPD -increased to 4L am 06/18/20 -CXR--personally reviewed--increase RLL atelectasis, ?LLL opacity -ABG -wean for saturation >90%  Hematochezia -GI notified,  following -developed evening 06/17/20 -06/17/20 EGD--s/p Roux-en-Y with ulcer (decreased in size) and stenosis at GJ anastomosis  -Hgb 10.2 in am 06/18/20 -serial H/H -continue protonix drip  Leukocytosis -check PCT -blood and urine culture -start empiric vanc and cefepime -MRSA  AKI -baseline creatinine 0.6-0.9 -peaking at 1.74 -due to hypotension, volume depletion -hold lasix  Questionable Atrial Fibrillation -No definitive atrial fibrillation noted on telemetry. -appreciate cardiology input -One strip labeled as 10 beats NSVT overnight but appears more consistent with narrow-complex tachycardia. K+ was 5.7 on 06/16/20 and Kayexalate has been administered. Mg stable at 2.1. -personally reviewed EKG--sinus, PACs, nonspecific T wave changes  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  -Post prandial with every meal developed gastric pain then followed with nausea vomiting -Continue Protonix, Carafate -Consulting gastroenterology >>>appreciate input -Ultrasound right upper quadrant negative for cholelithiasis or cholecystitis  Iron deficiency Anemia -iron saturation 7 -ferritin 12 -feraheme x 1 on 06/17/20  Hyperlipidemia -continue statin  HTN -continue amlodipine  Chronic pain syndrome -continue gabapentin -continue home dose oxycodone -PMDP  reviewed  Hyperkalemia -improved with kayexalate      Status is: Inpatient  Remains inpatient appropriate because:IV treatments appropriate due to intensity of illness or inability to take PO   Dispo: The patient is from: Home  Anticipated d/c is to: Home  Patient currently is not medically stable to d/c.              Difficult to place patient No    Total  time spent 35 minutes.  Greater than 50% spent face to face counseling and coordinating care.  Family Communication:  no Family at bedside  Consultants:  cardiology  Code Status:  FULL   DVT Prophylaxis:  SCDs   Procedures: As Listed in Progress Note Above  Antibiotics: Cefepime 4/7>> vanc 4/7>>   The patient is critically ill with multiple organ systems failure and requires high complexity decision making for assessment and support, frequent evaluation and titration of therapies, application of advanced monitoring technologies and extensive interpretation of multiple databases.  Critical care time - 45 mins.     Subjective: Patient is awake but confused.  Unable to obtain ROS.  No vomiting, uncontrolled pain, distress  Objective: Vitals:   06/18/20 0700 06/18/20 0715 06/18/20 0800 06/18/20 0900  BP: (!) 88/39 (!) 86/40 (!) 113/39 (!) 116/50  Pulse: 81 82 85 86  Resp: (!) 21 (!) 22 (!) 23 (!) 32  Temp:   98.1 F (36.7 C)   TempSrc:   Axillary   SpO2: 93% 95% 91% (!) 89%  Weight:      Height:        Intake/Output Summary (Last 24 hours) at 06/18/2020 1037 Last data filed at 06/18/2020 1013 Gross per 24 hour  Intake 1361 ml  Output 650 ml  Net 711 ml   Weight change:  Exam:   General:  Pt is alert, follows commands appropriately, not in acute distress  HEENT: No icterus, No thrush, No neck mass, [place]/AT  Cardiovascular: RRR, S1/S2, no rubs, no gallops  Respiratory: bibasilar rales.  Bibasilar wheeze  Abdomen: Soft/+BS, non tender, non distended, no guarding  Extremities: No edema, No lymphangitis, No petechiae, No rashes, no synovitis   Data Reviewed: I have personally reviewed following labs and imaging studies Basic Metabolic Panel: Recent Labs  Lab 06/15/20 0441 06/16/20 0454 06/16/20 1344 06/17/20 0648 06/18/20 0505  NA 137 135 141 137 131*  K 5.8* 5.7* 4.8 4.6 5.2*  CL 95* 89* 91* 89* 87*  CO2 34* 36* 36* 35* 30  GLUCOSE 84 94  82 84 182*  BUN 18 19 19 19  27*  CREATININE 0.85 1.05* 1.03* 1.16* 1.74*  CALCIUM 8.1* 8.7* 8.8* 8.7* 8.7*  MG  --  2.1  --   --   --    Liver Function Tests: Recent Labs  Lab 06/12/20 1413 06/15/20 1204 06/18/20 0505  AST 31 16 23   ALT 24 15 15   ALKPHOS 109 99 157*  BILITOT 0.6 0.6 1.3*  PROT 6.5 6.4* 5.3*  ALBUMIN 3.1* 3.0* 2.7*   No results for input(s): LIPASE, AMYLASE in the last 168 hours. No results for input(s): AMMONIA in the last 168 hours. Coagulation Profile: No results for input(s): INR, PROTIME in the last 168 hours. CBC: Recent Labs  Lab 06/12/20 1413 06/13/20 0606 06/15/20 1204 06/16/20 0951 06/18/20 0505  WBC 8.5 5.0 4.6  --  19.5*  NEUTROABS 6.9 2.9 3.1  --  17.3*  HGB 9.1* 8.0* 10.3* 9.6* 10.2*  HCT 31.8* 28.0* 36.6 33.1* 34.5*  MCV 92.2 92.1 92.7  --  89.1  PLT 366 284 310  --  299   Cardiac Enzymes: No results for input(s): CKTOTAL, CKMB, CKMBINDEX, TROPONINI in the last 168 hours. BNP: Invalid input(s): POCBNP CBG: Recent Labs  Lab 06/18/20 0453 06/18/20 0829 06/18/20 1035  GLUCAP 158* 180* 176*   HbA1C: No results for input(s): HGBA1C in the last 72 hours. Urine analysis:    Component Value Date/Time   COLORURINE AMBER (A) 01/29/2020 2032   APPEARANCEUR CLOUDY (A) 01/29/2020 2032   LABSPEC 1.025  01/29/2020 2032   PHURINE 5.0 01/29/2020 2032   GLUCOSEU NEGATIVE 01/29/2020 2032   HGBUR NEGATIVE 01/29/2020 2032   BILIRUBINUR SMALL (A) 01/29/2020 2032   KETONESUR NEGATIVE 01/29/2020 2032   PROTEINUR 30 (A) 01/29/2020 2032   NITRITE NEGATIVE 01/29/2020 2032   LEUKOCYTESUR TRACE (A) 01/29/2020 2032   Sepsis Labs: (procalcitonin:4,lacticidven:4) ) Recent Results (from the past 240 hour(s))  Blood culture (routine x 2)     Status: None   Collection Time: 06/12/20  3:05 PM   Specimen: BLOOD LEFT HAND  Result Value Ref Range Status   Specimen Description BLOOD LEFT HAND  Final   Special Requests   Final    Blood  Culture adequate volume BOTTLES DRAWN AEROBIC AND ANAEROBIC   Culture   Final    NO GROWTH 5 DAYS Performed at [facility], [address], [place], [place] [address]    Report Status 06/17/2020 FINAL  Final  Blood culture (routine x 2)     Status: None   Collection Time: 06/12/20  3:05 PM   Specimen: BLOOD RIGHT HAND  Result Value Ref Range Status   Specimen Description BLOOD RIGHT HAND  Final   Special Requests   Final    Blood Culture results may not be optimal due to an inadequate volume of blood received in culture bottles BOTTLES DRAWN AEROBIC AND ANAEROBIC   Culture   Final    NO GROWTH 5 DAYS Performed at [facility], [address], [place], [place] [address]    Report Status 06/17/2020 FINAL  Final  Resp Panel by RT-PCR (Flu A&B, Covid) Nasopharyngeal Swab     Status: None   Collection Time: 06/12/20  3:51 PM   Specimen: Nasopharyngeal Swab; Nasopharyngeal(NP) swabs in vial transport medium  Result Value Ref Range Status   SARS Coronavirus 2 by RT PCR NEGATIVE NEGATIVE Final    Comment: (NOTE) SARS-CoV-2 target nucleic acids are NOT DETECTED.  The SARS-CoV-2 RNA is generally detectable in upper respiratory specimens during the acute phase of infection. The lowest concentration of SARS-CoV-2 viral copies this assay can detect is 138 copies/mL. A negative result does not preclude SARS-Cov-2 infection and should not be used as the sole basis for treatment or other patient management decisions. A negative result may occur with  improper specimen collection/handling, submission of specimen other than nasopharyngeal swab, presence of viral mutation(s) within the areas targeted by this assay, and inadequate number of viral copies(<138 copies/mL). A negative result must be combined with clinical observations, patient history, and epidemiological information. The expected result is Negative.  Fact Sheet for Patients:  BloggerCourse.com  Fact Sheet  for Healthcare Providers:  SeriousBroker.it  This test is no t yet approved or cleared by the [place] FDA and  has been authorized for detection and/or diagnosis of SARS-CoV-2 by FDA under an Emergency Use Authorization (EUA). This EUA will remain  in effect (meaning this test can be used) for the duration of the COVID-19 declaration under Section 564(b)(1) of the Act, 21 U.S.C.section 360bbb-3(b)(1), unless the authorization is terminated  or revoked sooner.       Influenza A by PCR NEGATIVE NEGATIVE Final   Influenza B by PCR NEGATIVE NEGATIVE Final    Comment: (NOTE) The Xpert Xpress SARS-CoV-2/FLU/RSV plus assay is intended as an aid in the diagnosis of influenza from Nasopharyngeal swab specimens and should not be used as a sole basis for treatment. Nasal washings and aspirates are unacceptable for Xpert Xpress SARS-CoV-2/FLU/RSV testing.  Fact Sheet  for Patients: BloggerCourse.comhttps://www.fda.gov/media/152166/download  Fact Sheet for Healthcare Providers: SeriousBroker.ithttps://www.fda.gov/media/152162/download  This test is not yet approved or cleared by the [place] FDA and has been authorized for detection and/or diagnosis of SARS-CoV-2 by FDA under an Emergency Use Authorization (EUA). This EUA will remain in effect (meaning this test can be used) for the duration of the COVID-19 declaration under Section 564(b)(1) of the Act, 21 U.S.C. section 360bbb-3(b)(1), unless the authorization is terminated or revoked.  Performed at [facility], [address] [place], [place] [id number]      Scheduled Meds: . atorvastatin  20 mg Oral QHS  . budesonide (PULMICORT) nebulizer solution  0.5 mg Nebulization BID  . Chlorhexidine Gluconate Cloth  6 each Topical Daily  . furosemide  40 mg Oral Daily  . gabapentin  600 mg Oral TID  . ipratropium-albuterol  3 mL Nebulization TID  . metoprolol tartrate  12.5 mg Oral BID  . [START ON 06/21/2020] pantoprazole  40 mg  Intravenous Q12H  . QUEtiapine  200 mg Oral QHS  . rOPINIRole  3 mg Oral QHS  . sodium chloride flush  3 mL Intravenous Q12H  . sucralfate  1 g Oral TID with meals   Continuous Infusions: . sodium chloride    . pantoprozole (PROTONIX) infusion 8 mg/hr (06/18/20 0951)    Procedures/Studies: DG Chest 1 View  Result Date: 06/12/2020 CLINICAL DATA:  Shortness of breath EXAM: CHEST  1 VIEW COMPARISON:  January 15, 2020 FINDINGS: There is ill-defined airspace opacity in the right upper lobe. There is scarring in the left base. The heart is enlarged with pulmonary vascularity normal, stable. There is aortic atherosclerosis. Bones are osteoporotic. There is extensive arthropathy in the right shoulder. IMPRESSION: Ill-defined airspace opacity consistent with pneumonia right upper lobe. Scarring left base. Stable cardiac enlargement. Bones osteoporotic. Aortic Atherosclerosis (ICD10-I70.0). Electronically Signed   By: Bretta BangWilliam  Woodruff III M.D.   On: 06/12/2020 14:47   CT Angio Chest PE W and/or Wo Contrast  Result Date: 06/12/2020 CLINICAL DATA:  80 year old female with shortness of breath. EXAM: CT ANGIOGRAPHY CHEST WITH CONTRAST TECHNIQUE: Multidetector CT imaging of the chest was performed using the standard protocol during bolus administration of intravenous contrast. Multiplanar CT image reconstructions and MIPs were obtained to evaluate the vascular anatomy. CONTRAST:  Seventy-five mL Omnipaque 350, intravenous COMPARISON:  01/04/2020 FINDINGS: Cardiovascular: Satisfactory opacification of the pulmonary arteries to the segmental level. No evidence of pulmonary embolism. Similar appearing moderate global cardiomegaly. Mitral annular calcifications. Severe coronary atherosclerotic calcifications. Scattered atherosclerotic calcifications of the thoracic aorta. No pericardial effusion. Mediastinum/Nodes: No enlarged mediastinal, hilar, or axillary lymph nodes. The esophagus is patulous throughout with a  standing column of enteric contents. Thyroid gland and trachea demonstrate no significant findings. Lungs/Pleura: Bibasilar and lingular subsegmental atelectasis. Mild upper lobe predominant centrilobular emphysema. No suspicious pulmonary nodules. No pleural effusion or pneumothorax. 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 The visualized upper abdomen is otherwise within normal limits. Musculoskeletal: Progressive compression deformity of the T9 vertebral body with associated kyphosis at this level, now nearly vertebra plana morphology. Mild, unchanged retropulsion. 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 Multilevel chronic, bilateral posterolateral nondisplaced rib fractures. Advanced degenerative changes of the glenohumeral joints bilaterally. No acute osseous abnormality. Review of the MIP images confirms the above findings. IMPRESSION: Vascular: 1. No evidence of pulmonary embolism. 2. Unchanged moderate global cardiomegaly. 3. Severe coronary atherosclerotic calcifications. 4.  Aortic Atherosclerosis (ICD10-I70.0). Non-Vascular: 1. Bibasilar  and lingular subsegmental atelectasis. 2. Progressive compression deformity of the T9 vertebral body, now essentially vertebra plana. Unchanged mild retropulsion. Marliss Coots, MD [facility] Radiology Electronically Signed   By: Marliss Coots MD   On: 06/12/2020 16:45   US Venous Img Lower  Left (DVT Study)  Result Date: 06/12/2020 CLINICAL DATA:  Lower extremity pain and edema EXAM: LEFT LOWER EXTREMITY VENOUS DUPLEX ULTRASOUND TECHNIQUE: Gray-scale sonography with graded compression, as well as color Doppler and duplex ultrasound were performed to evaluate the left lower extremity deep venous system from the level of the common femoral vein and including the common femoral, femoral, profunda femoral, popliteal  and calf veins including the posterior tibial, peroneal and gastrocnemius veins when visible. The superficial great saphenous vein was also interrogated. Spectral Doppler was utilized to evaluate flow at rest and with distal augmentation maneuvers in the common femoral, femoral and popliteal veins. COMPARISON:  None. FINDINGS: Contralateral Common Femoral Vein: Respiratory phasicity is normal and symmetric with the symptomatic side. No evidence of thrombus. Normal compressibility. Common Femoral Vein: No evidence of thrombus. Normal compressibility, respiratory phasicity and response to augmentation. Saphenofemoral Junction: No evidence of thrombus. Normal compressibility and flow on color Doppler imaging. Profunda Femoral Vein: No evidence of thrombus. Normal compressibility and flow on color Doppler imaging. Femoral Vein: No evidence of thrombus. Normal compressibility, respiratory phasicity and response to augmentation. Popliteal Vein: No evidence of thrombus. Normal compressibility, respiratory phasicity and response to augmentation. Calf Veins: No evidence of thrombus. Normal compressibility and flow on color Doppler imaging. Superficial Great Saphenous Vein: No evidence of thrombus. Normal compressibility. Venous Reflux:  None. Other Findings:  Soft tissue edema noted in the calf region. IMPRESSION: No evidence of deep venous thrombosis in the left lower extremity. Right common femoral vein patent. There is left calf region soft tissue edema. Electronically Signed   By: Bretta Bang III M.D.   On: 06/12/2020 14:46   DG CHEST PORT 1 VIEW  Result Date: 06/15/2020 CLINICAL DATA:  Shortness of breath, hypertension, former smoker EXAM: PORTABLE CHEST 1 VIEW COMPARISON:  Portable exam 0656 hours compared to 06/12/2020 FINDINGS: Enlargement of cardiac silhouette. Mediastinal contours and pulmonary vascularity normal. Atherosclerotic calcification aorta. Bibasilar atelectasis. No acute infiltrate, pleural  effusion, or pneumothorax. Bones demineralized with advanced degenerative changes of the RIGHT glenohumeral joint. IMPRESSION: Enlargement of cardiac silhouette with bibasilar atelectasis. Aortic Atherosclerosis (ICD10-I70.0). Electronically Signed   By: Ulyses Southward M.D.   On: 06/15/2020 08:06   ECHOCARDIOGRAM COMPLETE  Result Date: 06/13/2020    ECHOCARDIOGRAM REPORT   Patient Name:   Brenda Hopkins Date of Exam: 06/13/2020 Medical Rec #:  [id number]           Height:       64.0 in Accession #:    [id number]          Weight:       157.0 lb Date of Birth:  1940-07-26           BSA:          1.765 m Patient Age:    79 years            BP:           108/55 mmHg Patient Gender: F                   HR:           91 bpm. [facility] Procedure:  2D Echo Indications:    CHF-Acute Diastolic I50.31  History:        Patient has prior history of Echocardiogram examinations, most                 recent 01/16/2020. Risk Factors:Hypertension, Dyslipidemia and                 Former Smoker.  Sonographer:    Jeryl Columbia RDCS (AE) Referring Phys: [id number] [address] [place] IMPRESSIONS  1. Left ventricular ejection fraction, by estimation, is 55 to 60%. The left ventricle has normal function. The left ventricle has no regional wall motion abnormalities. There is mild concentric left ventricular hypertrophy of the basal-septal segment. Left ventricular diastolic parameters are consistent with Grade I diastolic dysfunction (impaired relaxation).  2. Right ventricular systolic function is normal. The right ventricular size is normal. There is mildly elevated pulmonary artery systolic pressure. The estimated right ventricular systolic pressure is 42.0 mmHg.  3. The mitral valve is grossly normal. No evidence of mitral valve regurgitation. No evidence of mitral stenosis. Severe mitral annular calcification.  4. The aortic valve was not well visualized. There is mild calcification of the aortic valve. Aortic valve  regurgitation is not visualized. No aortic stenosis is present.  5. The inferior vena cava is dilated in size with <50% respiratory variability, suggesting right atrial pressure of 15 mmHg. Comparison(s): A prior study was performed on 01/16/2020. Left ventricle is less hyperdynamic. FINDINGS  Left Ventricle: Left ventricular ejection fraction, by estimation, is 55 to 60%. The left ventricle has normal function. The left ventricle has no regional wall motion abnormalities. The left ventricular internal cavity size was normal in size. There is  mild concentric left ventricular hypertrophy of the basal-septal segment. Left ventricular diastolic parameters are consistent with Grade I diastolic dysfunction (impaired relaxation). Right Ventricle: The right ventricular size is normal. No increase in right ventricular wall thickness. Right ventricular systolic function is normal. There is mildly elevated pulmonary artery systolic pressure. The tricuspid regurgitant velocity is 2.60  m/s, and with an assumed right atrial pressure of 15 mmHg, the estimated right ventricular systolic pressure is 42.0 mmHg. Left Atrium: Left atrial size was normal in size. Right Atrium: Right atrial size was normal in size. Pericardium: There is no evidence of pericardial effusion. Mitral Valve: The mitral valve is grossly normal. There is mild thickening of the mitral valve leaflet(s). There is moderate calcification of the mitral valve leaflet(s). Severe mitral annular calcification. No evidence of mitral valve regurgitation. No evidence of mitral valve stenosis. MV peak gradient, 6.7 mmHg. The mean mitral valve gradient is 3.0 mmHg with average heart rate of 98 bpm. Tricuspid Valve: The tricuspid valve is grossly normal. Tricuspid valve regurgitation is trivial. No evidence of tricuspid stenosis. Aortic Valve: The aortic valve was not well visualized. There is mild calcification of the aortic valve. Aortic valve regurgitation is not  visualized. No aortic stenosis is present. Pulmonic Valve: The pulmonic valve was not well visualized. Pulmonic valve regurgitation is not visualized. No evidence of pulmonic stenosis. Aorta: The aortic root is normal in size and structure. Venous: The inferior vena cava is dilated in size with less than 50% respiratory variability, suggesting right atrial pressure of 15 mmHg. IAS/Shunts: The atrial septum is grossly normal.  LEFT VENTRICLE PLAX 2D LVIDd:         4.18 cm  Diastology LVIDs:         2.99 cm  LV e' medial:  5.00 cm/s LV PW:         1.40 cm  LV E/e' medial:  18.3 LV IVS:        1.35 cm  LV e' lateral:   4.24 cm/s LVOT diam:     2.10 cm  LV E/e' lateral: 21.5 LVOT Area:     3.46 cm  RIGHT VENTRICLE RV S prime:     14.30 cm/s TAPSE (M-mode): 2.4 cm LEFT ATRIUM             Index       RIGHT ATRIUM           Index LA diam:        4.30 cm 2.44 cm/m  RA Area:     14.40 cm LA Vol (A2C):   52.3 ml 29.63 ml/m RA Volume:   35.60 ml  20.17 ml/m LA Vol (A4C):   48.3 ml 27.37 ml/m LA Biplane Vol: 52.1 ml 29.52 ml/m   AORTA Ao Root diam: 2.50 cm MITRAL VALVE                TRICUSPID VALVE MV Area (PHT): 3.36 cm     TR Peak grad:   27.0 mmHg MV Peak grad:  6.7 mmHg     TR Vmax:        260.00 cm/s MV Mean grad:  3.0 mmHg MV Vmax:       1.29 m/s     SHUNTS MV Vmean:      87.5 cm/s    Systemic Diam: 2.10 cm MV Decel Time: 226 msec MV E velocity: 91.30 cm/s MV A velocity: 120.00 cm/s MV E/A ratio:  0.76 Riley Lam MD Electronically signed by Riley Lam MD Signature Date/Time: 06/13/2020/3:04:50 PM    Final    US Abdomen Limited RUQ (LIVER/GB)  Result Date: 06/15/2020 CLINICAL DATA:  Diffuse abdominal pain. 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 EXAM: ULTRASOUND ABDOMEN LIMITED RIGHT UPPER QUADRANT COMPARISON:  Prior CT scan November 2021 FINDINGS: Gallbladder: No gallstones or wall thickening visualized. No sonographic Murphy sign noted by sonographer.  Common bile duct: Diameter: Within normal limits at 5-6 mm Liver: No focal lesion identified. Within normal limits in parenchymal echogenicity. Portal vein is patent on color Doppler imaging with normal direction of blood flow towards the liver. Other: None. IMPRESSION: Negative right upper quadrant ultrasound. Electronically Signed   By: Malachy Moan M.D.   On: 06/15/2020 09:55    Catarina Hartshorn, DO  [facility]  If 7PM-7AM, please contact night-coverage [email or web address] Password [facility] 06/18/2020, 10:37 AM   LOS: 6 days

## 2020-06-18 NOTE — Progress Notes (Addendum)
Subjective:  Resting comfortably upon entering the room. Patient arouses easily. Opens eyes briefly. Moans and appears agitated. Does not answer questions. Per nursing staff, patient transferred to ICU early AM after having hypotension, passing dark red looking stool. Received bolus of fluid. Heme + stool. IV protonix drip started and lovenox stopped. Nursing concerned that is less responsive than yesterday.   Objective: Vital signs in last 24 hours: Temp:  [97.4 F (36.3 C)-99 F (37.2 C)] 97.8 F (36.6 C) (04/07 0500) Pulse Rate:  [69-135] 82 (04/07 0715) Resp:  [14-28] 22 (04/07 0715) BP: (81-144)/(39-84) 86/40 (04/07 0715) SpO2:  [87 %-99 %] 95 % (04/07 0715) FiO2 (%):  [28 %] 28 % (04/06 1017) Last BM Date: 06/17/20 General:   Resting, arouses with verbal stimuli but quickly closes eyes. Appears agitated, moans. Head:  Normocephalic and atraumatic. Eyes:  Sclera clear, no icterus.  Abdomen:  Soft, nondistended. Normal bowel sounds, without guarding, and without rebound.  Difficult to assess for tenderness given change in mental status. Extremities:  Without clubbing, deformity or edema. Neurologic:  Arouses but quickly falls back to sleep. Does not communicate verbally. Psych:  Could not assess  Intake/Output from previous day: 04/06 0701 - 04/07 0700 In: 1218 [P.O.:750] Out: 650 [Urine:650] Intake/Output this shift: No intake/output data recorded.  Lab Results: CBC Recent Labs    06/15/20 1204 06/16/20 0951 06/18/20 0505  WBC 4.6  --  19.5*  HGB 10.3* 9.6* 10.2*  HCT 36.6 33.1* 34.5*  MCV 92.7  --  89.1  PLT 310  --  299   BMET Recent Labs    06/16/20 1344 06/17/20 0648 06/18/20 0505  NA 141 137 131*  K 4.8 4.6 5.2*  CL 91* 89* 87*  CO2 36* 35* 30  GLUCOSE 82 84 182*  BUN 19 19 27*  CREATININE 1.03* 1.16* 1.74*  CALCIUM 8.8* 8.7* 8.7*   LFTs Recent Labs    06/15/20 1204 06/18/20 0505  BILITOT 0.6 1.3*  BILIDIR 0.1  --   IBILI 0.5  --   ALKPHOS  99 157*  AST 16 23  ALT 15 15  PROT 6.4* 5.3*  ALBUMIN 3.0* 2.7*   No results for input(s): LIPASE in the last 72 hours. PT/INR No results for input(s): LABPROT, INR in the last 72 hours.    Imaging Studies: DG Chest 1 View  Result Date: 06/12/2020 CLINICAL DATA:  Shortness of breath EXAM: CHEST  1 VIEW COMPARISON:  January 15, 2020 FINDINGS: There is ill-defined airspace opacity in the right upper lobe. There is scarring in the left base. The heart is enlarged with pulmonary vascularity normal, stable. There is aortic atherosclerosis. Bones are osteoporotic. There is extensive arthropathy in the right shoulder. IMPRESSION: Ill-defined airspace opacity consistent with pneumonia right upper lobe. Scarring left base. Stable cardiac enlargement. Bones osteoporotic. Aortic Atherosclerosis (ICD10-I70.0). Electronically Signed   By: Bretta Bang III M.D.   On: 06/12/2020 14:47   CT Angio Chest PE W and/or Wo Contrast  Result Date: 06/12/2020 CLINICAL DATA:  80 year old female with shortness of breath. EXAM: CT ANGIOGRAPHY CHEST WITH CONTRAST TECHNIQUE: Multidetector CT imaging of the chest was performed using the standard protocol during bolus administration of intravenous contrast. Multiplanar CT image reconstructions and MIPs were obtained to evaluate the vascular anatomy. CONTRAST:  Seventy-five mL Omnipaque 350, intravenous COMPARISON:  01/04/2020 FINDINGS: Cardiovascular: Satisfactory opacification of the pulmonary arteries to the segmental level. No evidence of pulmonary embolism. Similar appearing moderate global cardiomegaly. Mitral annular calcifications. Severe  coronary atherosclerotic calcifications. Scattered atherosclerotic calcifications of the thoracic aorta. No pericardial effusion. Mediastinum/Nodes: No enlarged mediastinal, hilar, or axillary lymph nodes. The esophagus is patulous throughout with a standing column of enteric contents. Thyroid gland and trachea demonstrate no  significant findings. Lungs/Pleura: Bibasilar and lingular subsegmental atelectasis. Mild upper lobe predominant centrilobular emphysema. No suspicious pulmonary nodules. No pleural effusion or pneumothorax. 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 The visualized upper abdomen is otherwise within normal limits. Musculoskeletal: Progressive compression deformity of the T9 vertebral body with associated kyphosis at this level, now nearly vertebra plana morphology. Mild, unchanged retropulsion. 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 Multilevel chronic, bilateral posterolateral nondisplaced rib fractures. Advanced degenerative changes of the glenohumeral joints bilaterally. No acute osseous abnormality. Review of the MIP images confirms the above findings. IMPRESSION: Vascular: 1. No evidence of pulmonary embolism. 2. Unchanged moderate global cardiomegaly. 3. Severe coronary atherosclerotic calcifications. 4.  Aortic Atherosclerosis (ICD10-I70.0). Non-Vascular: 1. Bibasilar and lingular subsegmental atelectasis. 2. Progressive compression deformity of the T9 vertebral body, now essentially vertebra plana. Unchanged mild retropulsion. Marliss Coots, MD [facility] Electronically Signed   By: Marliss Coots MD   On: 06/12/2020 16:45   US Venous Img Lower  Left (DVT Study)  Result Date: 06/12/2020 CLINICAL DATA:  Lower extremity pain and edema EXAM: LEFT LOWER EXTREMITY VENOUS DUPLEX ULTRASOUND TECHNIQUE: Gray-scale sonography with graded compression, as well as color Doppler and duplex ultrasound were performed to evaluate the left lower extremity deep venous system from the level of the common femoral vein and including the common femoral, femoral, profunda femoral, popliteal and calf veins including the posterior tibial, peroneal and gastrocnemius  veins when visible. The superficial great saphenous vein was also interrogated. Spectral Doppler was utilized to evaluate flow at rest and with distal augmentation maneuvers in the common femoral, femoral and popliteal veins. COMPARISON:  None. FINDINGS: Contralateral Common Femoral Vein: Respiratory phasicity is normal and symmetric with the symptomatic side. No evidence of thrombus. Normal compressibility. Common Femoral Vein: No evidence of thrombus. Normal compressibility, respiratory phasicity and response to augmentation. Saphenofemoral Junction: No evidence of thrombus. Normal compressibility and flow on color Doppler imaging. Profunda Femoral Vein: No evidence of thrombus. Normal compressibility and flow on color Doppler imaging. Femoral Vein: No evidence of thrombus. Normal compressibility, respiratory phasicity and response to augmentation. Popliteal Vein: No evidence of thrombus. Normal compressibility, respiratory phasicity and response to augmentation. Calf Veins: No evidence of thrombus. Normal compressibility and flow on color Doppler imaging. Superficial Great Saphenous Vein: No evidence of thrombus. Normal compressibility. Venous Reflux:  None. Other Findings:  Soft tissue edema noted in the calf region. IMPRESSION: No evidence of deep venous thrombosis in the left lower extremity. Right common femoral vein patent. There is left calf region soft tissue edema. Electronically Signed   By: Bretta Bang III M.D.   On: 06/12/2020 14:46   DG CHEST PORT 1 VIEW  Result Date: 06/15/2020 CLINICAL DATA:  Shortness of breath, hypertension, former smoker EXAM: PORTABLE CHEST 1 VIEW COMPARISON:  Portable exam 0656 hours compared to 06/12/2020 FINDINGS: Enlargement of cardiac silhouette. Mediastinal contours and pulmonary vascularity normal. Atherosclerotic calcification aorta. Bibasilar atelectasis. No acute infiltrate, pleural effusion, or pneumothorax. Bones demineralized with advanced degenerative  changes of the RIGHT glenohumeral joint. IMPRESSION: Enlargement of cardiac silhouette with bibasilar atelectasis. Aortic Atherosclerosis (ICD10-I70.0). Electronically Signed   By: Ulyses Southward M.D.   On: 06/15/2020  08:06   ECHOCARDIOGRAM COMPLETE  Result Date: 06/13/2020    ECHOCARDIOGRAM REPORT   Patient Name:   LINN CLAVIN Date of Exam: 06/13/2020 Medical Rec #:  [id number]           Height:       64.0 in Accession #:    [id number]          Weight:       157.0 lb Date of Birth:  01-Aug-1940           BSA:          1.765 m Patient Age:    79 years            BP:           108/55 mmHg Patient Gender: F                   HR:           91 bpm. [facility] Procedure: 2D Echo Indications:    CHF-Acute Diastolic I50.31  History:        Patient has prior history of Echocardiogram examinations, most                 recent 01/16/2020. Risk Factors:Hypertension, Dyslipidemia and                 Former Smoker.  Sonographer:    Jeryl Columbia RDCS (AE) Referring Phys: [id number] DAWO[address] S ELGERGAWY IMPRESSIONS  1. Left ventricular ejection fraction, by estimation, is 55 to 60%. The left ventricle has normal function. The left ventricle has no regional wall motion abnormalities. There is mild concentric left ventricular hypertrophy of the basal-septal segment. Left ventricular diastolic parameters are consistent with Grade I diastolic dysfunction (impaired relaxation).  2. Right ventricular systolic function is normal. The right ventricular size is normal. There is mildly elevated pulmonary artery systolic pressure. The estimated right ventricular systolic pressure is 42.0 mmHg.  3. The mitral valve is grossly normal. No evidence of mitral valve regurgitation. No evidence of mitral stenosis. Severe mitral annular calcification.  4. The aortic valve was not well visualized. There is mild calcification of the aortic valve. Aortic valve regurgitation is not visualized. No aortic stenosis is present.  5. The inferior  vena cava is dilated in size with <50% respiratory variability, suggesting right atrial pressure of 15 mmHg. Comparison(s): A prior study was performed on 01/16/2020. Left ventricle is less hyperdynamic. FINDINGS  Left Ventricle: Left ventricular ejection fraction, by estimation, is 55 to 60%. The left ventricle has normal function. The left ventricle has no regional wall motion abnormalities. The left ventricular internal cavity size was normal in size. There is  mild concentric left ventricular hypertrophy of the basal-septal segment. Left ventricular diastolic parameters are consistent with Grade I diastolic dysfunction (impaired relaxation). Right Ventricle: The right ventricular size is normal. No increase in right ventricular wall thickness. Right ventricular systolic function is normal. There is mildly elevated pulmonary artery systolic pressure. The tricuspid regurgitant velocity is 2.60  m/s, and with an assumed right atrial pressure of 15 mmHg, the estimated right ventricular systolic pressure is 42.0 mmHg. Left Atrium: Left atrial size was normal in size. Right Atrium: Right atrial size was normal in size. Pericardium: There is no evidence of pericardial effusion. Mitral Valve: The mitral valve is grossly normal. There is mild thickening of the mitral valve leaflet(s). There is moderate calcification of the mitral valve leaflet(s). Severe mitral annular calcification. No  evidence of mitral valve regurgitation. No evidence of mitral valve stenosis. MV peak gradient, 6.7 mmHg. The mean mitral valve gradient is 3.0 mmHg with average heart rate of 98 bpm. Tricuspid Valve: The tricuspid valve is grossly normal. Tricuspid valve regurgitation is trivial. No evidence of tricuspid stenosis. Aortic Valve: The aortic valve was not well visualized. There is mild calcification of the aortic valve. Aortic valve regurgitation is not visualized. No aortic stenosis is present. Pulmonic Valve: The pulmonic valve was not  well visualized. Pulmonic valve regurgitation is not visualized. No evidence of pulmonic stenosis. Aorta: The aortic root is normal in size and structure. Venous: The inferior vena cava is dilated in size with less than 50% respiratory variability, suggesting right atrial pressure of 15 mmHg. IAS/Shunts: The atrial septum is grossly normal.  LEFT VENTRICLE PLAX 2D LVIDd:         4.18 cm  Diastology LVIDs:         2.99 cm  LV e' medial:    5.00 cm/s LV PW:         1.40 cm  LV E/e' medial:  18.3 LV IVS:        1.35 cm  LV e' lateral:   4.24 cm/s LVOT diam:     2.10 cm  LV E/e' lateral: 21.5 LVOT Area:     3.46 cm  RIGHT VENTRICLE RV S prime:     14.30 cm/s TAPSE (M-mode): 2.4 cm LEFT ATRIUM             Index       RIGHT ATRIUM           Index LA diam:        4.30 cm 2.44 cm/m  RA Area:     14.40 cm LA Vol (A2C):   52.3 ml 29.63 ml/m RA Volume:   35.60 ml  20.17 ml/m LA Vol (A4C):   48.3 ml 27.37 ml/m LA Biplane Vol: 52.1 ml 29.52 ml/m   AORTA Ao Root diam: 2.50 cm MITRAL VALVE                TRICUSPID VALVE MV Area (PHT): 3.36 cm     TR Peak grad:   27.0 mmHg MV Peak grad:  6.7 mmHg     TR Vmax:        260.00 cm/s MV Mean grad:  3.0 mmHg MV Vmax:       1.29 m/s     SHUNTS MV Vmean:      87.5 cm/s    Systemic Diam: 2.10 cm MV Decel Time: 226 msec MV E velocity: 91.30 cm/s MV A velocity: 120.00 cm/s MV E/A ratio:  0.76 Riley LamMahesh Chandrasekhar MD Electronically signed by Riley LamMahesh Chandrasekhar MD Signature Date/Time: 06/13/2020/3:04:50 PM    Final    US Abdomen Limited RUQ (LIVER/GB)  Result Date: 06/15/2020 CLINICAL DATA:  Diffuse abdominal pain. 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 EXAM: ULTRASOUND ABDOMEN LIMITED RIGHT UPPER QUADRANT COMPARISON:  Prior CT scan November 2021 FINDINGS: Gallbladder: No gallstones or wall thickening visualized. No sonographic Murphy sign noted by sonographer. Common bile duct: Diameter: Within normal limits at 5-6 mm Liver: No focal lesion  identified. Within normal limits in parenchymal echogenicity. Portal vein is patent on color Doppler imaging with normal direction of blood flow towards the liver. Other: None. IMPRESSION: Negative right upper quadrant ultrasound. Electronically Signed   By: Malachy MoanHeath  McCullough M.D.   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  Likely ongoing NSAID use, Celebrex since February.  Right upper quadrant ultrasound this admission with no acute findings, no cholelithiasis.  LFTs remain normal.  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  No evidence of bleeding during EGD or after dilation.  Anemia: Worsening anemia on admission, hemoglobin 9.1 on admission and previously 11.1 in November.  Globin drifted down to 8.0 on April 2 but up to 10.3 on April 4 without intervention.  Ferritin 12, iron 27, iron saturation 7%.  Anemia felt to be multifactorial.  Received IV iron this admission.  GI bleeding/mental status change: Patient developed orangeish/red-colored stool early this morning, felt to be blood.  Heme positive.  Hypotension noted.  Transferred to the ICU. BPs improved.  Nursing staff concerned regarding mental status changes, patient more lethargic, noncommunicative.  Appears agitated and moans when aroused.  Labs this morning with white blood cell count of 19,500, hemoglobin 10.2, sodium down to 131, potassium 5.2, BUN of 27, creatinine 1.74, alkaline phosphatase up to 157, total bilirubin up to 1.3.  Plan: Notified Dr. Arbutus Leas regarding concern for change in mental status. Repeat CBC  now. Monitor for further GI bleeding.  Will reassess later this morning for any change in abdominal exam.   Leanna Battles. Dixon Boos [facility] [phone] 4/7/20229:54 AM     LOS: 6 days    Addendum: repeat CBC with WBC 18,800, H/H 9.8/32.5, ammonia 23, blood culture/urine culture obtained. CXR with persistent bronchitic changes and bibasilar atelectasis. Repeat abdominal exam, with soft abdomen, ventral hernia noted, patient does not grimace with abdominal palpation.Flexi-seal in place with brown liquid stool with rust tinge noted. At this time, abdominal exam appears benign. Will continue to monitor for GI bleeding. Dr. Jena Gauss will reassess. Hold off on CT abd/pelvis without contrast for now unless clinical status changes.    Leanna Battles. Dixon Boos [facility] [phone] 4/7/202212:34 PM  Attending note: Patient not in her room when I came to see her this afternoon.  Reported to be in radiology for head CT.  Nursing staff reports no abdominal pain this afternoon. Agree with further evaluation of mental status changes noted today.

## 2020-06-18 NOTE — Consult Note (Signed)
Consultation Note Date: 06/18/2020   Patient Name: Brenda Hopkins  DOB: 24-Dec-1940  MRN: [id number]  Age / Sex: 80 y.o., female  PCP: Cory Munch, PA-C Referring Physician: Orson Eva, MD  Reason for Consultation: Establishing goals of care  HPI/Patient Profile: 80 y.o. female  with past medical history of hypertension, anxiety, marginal ulcer, COPD, tobacco abuse, chronic pain, history of gastric bypass admitted on 06/12/2020 with shortness of breath x 1 week worse when lying down or with exertion. Also with epigastric/lower chest pain and lower extremity edema recently began Lasix with new atrial fibrillation and referral to cardiology (not seen yet prior to admission). Found to have acute on chronic diastolic heart failure and atrial fibrillation and cardiology assisted with diuresis. 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 Complication early am 08/19/10 with acute rectal bleeding, hypotension, transfer to ICU, less repsonsive. Noted to have worsening renal function and significant leukocytosis.   Clinical Assessment and Goals of Care: I met today initially with Brenda Hopkins daughter, Brenda Hopkins. Brenda Hopkins is very overwhelmed with her mother's decline since yesterday. She tells me that they have not spoken about her mother's wishes for resuscitation but only about nursing home. I assisted her to have letter for her daughter to excuse her from exam so she can come to the hospital and be with her family.   I returned later to discuss further with Brenda Hopkins and Brenda Hopkins has returned from head CT but no results yet. Brenda Hopkins is more alert and answers questions appropriately but is extremely restless in bed and complains of abd pain. Abd is soft with lower abd tenderness. Concern for potential bladder distention and Brenda Hopkins reports  that Brenda Hopkins has been complaining of discomfort with urination.   Brenda Hopkins shares that she had a chance to speak with her mother and they have decided on DNR and she would NOT want resuscitation. Brenda Hopkins would like to ensure Brenda Hopkins has pain medication if needed and does not want her mother to suffer. She is noted to be on 60 mg oxycodone daily (previously on 90 mg daily) and has taken oxycodone daily for many years. Brenda Hopkins does not want to withhold pain medication from her mother even if this will make her more sedated she does not want her miserable or uncomfortable. They are hopeful that with continued work up and treatment she will continue to show improvement. She is already more responsive and alert this afternoon as compared to earlier today. With further decline they would be open to comfort focused care.   All questions/concerns addressed. Emotional support provided. Discussed with Dr. Carles Collet.   Primary Decision Maker NEXT OF KIN daughter Brenda Hopkins at bedside and son en route from out of town    [place]   - DNR decided - Continue supportive care and work up - Consideration of comfort focused care with further decline despite treatment  Code Status/Advance Care Planning:  DNR   Symptom  Management:   Per attending, GI, cardiology.   Palliative Prophylaxis:   Aspiration, Bowel Regimen, Delirium Protocol, Frequent Pain Assessment, Oral Care and Turn Reposition  Psycho-social/Spiritual:   Desire for further Chaplaincy support:yes  Additional Recommendations: Caregiving  Support/Resources  Prognosis:   To be determined.   Discharge Planning: To Be Determined      Primary Diagnoses: Present on Admission: . Acute respiratory failure with hypoxia ([place]) . GERD (gastroesophageal reflux disease) . Hyperlipidemia . Hypertension . Acute on chronic diastolic CHF (congestive heart failure) ([place]) . AKI (acute kidney injury) ([place])   I have reviewed the  medical record, interviewed the patient and family, and examined the patient. The following aspects are pertinent.  Past Medical History:  Diagnosis Date  . Chronic back pain   . GERD (gastroesophageal reflux disease)   . Hiatal hernia   . Hyperlipidemia   . Hypertension   . Pelvic fracture ([place]) 12/12/2012  . Smoker    Social History   Socioeconomic History  . Marital status: Divorced    Spouse name: Not on file  . Number of children: Not on file  . Years of education: Not on file  . Highest education level: Not on file  Occupational History  . Not on file  Tobacco Use  . Smoking status: Former Smoker    Packs/day: 0.50    Types: Cigarettes  . Smokeless tobacco: Former Systems developer    Quit date: 12/28/2012  Vaping Use  . Vaping Use: Every day  Substance and Sexual Activity  . Alcohol use: No  . Drug use: No  . Sexual activity: Not on file  Other Topics Concern  . Not on file  Social History Narrative  . Not on file   Social Determinants of Health   Financial Resource Strain: Not on file  Food Insecurity: Not on file  Transportation Needs: Not on file  Physical Activity: Not on file  Stress: Not on file  Social Connections: Not on file   History reviewed. No pertinent family history. Scheduled Meds: . atorvastatin  20 mg Oral QHS  . budesonide (PULMICORT) nebulizer solution  0.5 mg Nebulization BID  . Chlorhexidine Gluconate Cloth  6 each Topical Daily  . ipratropium-albuterol  3 mL Nebulization TID  . metoprolol tartrate  12.5 mg Oral BID  . [START ON 06/21/2020] pantoprazole  40 mg Intravenous Q12H  . sodium chloride flush  3 mL Intravenous Q12H  . sucralfate  1 g Oral TID with meals   Continuous Infusions: . sodium chloride    . ceFEPime (MAXIPIME) IV 2 g (06/18/20 1216)  . pantoprozole (PROTONIX) infusion 8 mg/hr (06/18/20 0951)  . [START ON 06/20/2020] vancomycin    . vancomycin 1,500 mg (06/18/20 1351)   PRN Meds:.sodium chloride, acetaminophen, alum & mag  hydroxide-simeth, nitroGLYCERIN, ondansetron (ZOFRAN) IV, sodium chloride flush Allergies  Allergen Reactions  . Prozac [Fluoxetine Hcl] Other (See Comments)    Makes her fell crazy  . Zoloft [Sertraline Hcl]     Made her feel crazy--10/2013  . Nicoderm [Nicotine] Rash    Site rash   Review of Systems  Unable to perform ROS: Acuity of condition  Constitutional: Positive for activity change and appetite change.  Gastrointestinal: Positive for abdominal pain. Negative for abdominal distention and nausea.  Psychiatric/Behavioral: Positive for agitation.    Physical Exam Vitals and nursing note reviewed.  Constitutional:      General: She is awake. She is in acute distress.     Appearance: She is ill-appearing.  Cardiovascular:     Rate and Rhythm: Normal rate.  Pulmonary:     Effort: No tachypnea, accessory muscle usage or respiratory distress.  Abdominal:     Palpations: Abdomen is soft.     Tenderness: There is abdominal tenderness in the right lower quadrant, suprapubic area and left lower quadrant.     Hernia: A hernia is present.  Neurological:     Mental Status: She is alert.     Comments: Answering simple questions appropriately. Knows family names at bedside.   Psychiatric:        Behavior: Behavior is agitated.     Comments: Very restless     Vital Signs: BP (!) 111/53   Pulse 86   Temp 99.6 F (37.6 C) (Axillary)   Resp (!) 21   Ht '5\' 4"'  (1.626 m)   Wt 67 kg   SpO2 100%   BMI 25.35 kg/m  Pain Scale: PAINAD   Pain Score: 0-No pain   SpO2: SpO2: 100 % O2 Device:SpO2: 100 % O2 Flow Rate: .O2 Flow Rate (L/min): 4 L/min  IO: Intake/output summary:   Intake/Output Summary (Last 24 hours) at 06/18/2020 1354 Last data filed at 06/18/2020 1013 Gross per 24 hour  Intake 851 ml  Output --  Net 851 ml    LBM: Last BM Date: 06/17/20 Baseline Weight: Weight: 71.2 kg Most recent weight: Weight: 67 kg     Palliative Assessment/Data:     Time In:  1520 Time Out: 1630 Time Total: 70 min Greater than 50%  of this time was spent counseling and coordinating care related to the above assessment and plan.  Signed by: Vinie Sill, NP Palliative Medicine Team Pager # [phone] (M-F 8a-5p) Team Phone # [phone] (Nights/Weekends)

## 2020-06-18 NOTE — Progress Notes (Signed)
Came back to visit patient this afternoon to assess her abdominal pain.  Patient very agitated.  Appeared in discomfort. Abdomen was diffusely tender to palpation with a golf ball size ventral hernia appreciated-easily reducible. Family in attendance at the bedside notes she was not looking this way at all yesterday. Nursing staff subsequently placed a urinary catheter with return of 1 L. Per staff this was associated with impressive relief of abdominal pain and agitation. Urinary catheter will remain indwelling for now.  We will hold off abdominal CT scanning for now.  We will reassess tomorrow morning.

## 2020-06-18 NOTE — Progress Notes (Addendum)
1900 Patient continuously attempting to climb OOB, pulling at lines/tubing despite mittens. Continuously moaning, hollering incomprehensible speech. Unable to follow commands at this time.  1915 Fentanyl given PRN IV push per provider order, see eMAR.   2030 Fentanyl given PRN  IV push per provider order, see eMAR.   2120 Fentanyl given PRN IV push per provider order, see eMAR.   2130 Hospitalist Sandy, DO notified.  2145 Orders placed for bilateral soft wrist restraints. Restraints initiated per provider order at this time.   2215 Patient continuously attempting to climb OOB, pulling at lines and tubing despite bilateral soft wrist restraints/mittens. Continuously moaning, hollering incomprehensible speech. Unable to follow commands at this time. Hospitalist Frankey Shown, DO notified.   2240: Haldol 2 mg given IM per provider order, see eMAR.

## 2020-06-19 ENCOUNTER — Inpatient Hospital Stay (HOSPITAL_COMMUNITY): Payer: Medicare Other

## 2020-06-19 ENCOUNTER — Encounter (HOSPITAL_COMMUNITY): Payer: Self-pay | Admitting: Internal Medicine

## 2020-06-19 DIAGNOSIS — R6521 Severe sepsis with septic shock: Secondary | ICD-10-CM

## 2020-06-19 DIAGNOSIS — K6289 Other specified diseases of anus and rectum: Secondary | ICD-10-CM

## 2020-06-19 DIAGNOSIS — J9601 Acute respiratory failure with hypoxia: Secondary | ICD-10-CM | POA: Diagnosis not present

## 2020-06-19 DIAGNOSIS — G9341 Metabolic encephalopathy: Secondary | ICD-10-CM | POA: Diagnosis not present

## 2020-06-19 DIAGNOSIS — N179 Acute kidney failure, unspecified: Secondary | ICD-10-CM | POA: Diagnosis not present

## 2020-06-19 DIAGNOSIS — R6510 Systemic inflammatory response syndrome (SIRS) of non-infectious origin without acute organ dysfunction: Secondary | ICD-10-CM

## 2020-06-19 DIAGNOSIS — I5033 Acute on chronic diastolic (congestive) heart failure: Secondary | ICD-10-CM | POA: Diagnosis not present

## 2020-06-19 DIAGNOSIS — A419 Sepsis, unspecified organism: Secondary | ICD-10-CM

## 2020-06-19 LAB — COMPREHENSIVE METABOLIC PANEL
ALT: 17 U/L (ref 0–44)
AST: 26 U/L (ref 15–41)
Albumin: 2.4 g/dL — ABNORMAL LOW (ref 3.5–5.0)
Alkaline Phosphatase: 113 U/L (ref 38–126)
Anion gap: 10 (ref 5–15)
BUN: 30 mg/dL — ABNORMAL HIGH (ref 8–23)
CO2: 31 mmol/L (ref 22–32)
Calcium: 8.1 mg/dL — ABNORMAL LOW (ref 8.9–10.3)
Chloride: 94 mmol/L — ABNORMAL LOW (ref 98–111)
Creatinine, Ser: 1.68 mg/dL — ABNORMAL HIGH (ref 0.44–1.00)
GFR, Estimated: 31 mL/min — ABNORMAL LOW (ref >60)
Glucose, Bld: 95 mg/dL (ref 70–99)
Potassium: 4 mmol/L (ref 3.5–5.1)
Sodium: 135 mmol/L (ref 135–145)
Total Bilirubin: 1 mg/dL (ref 0.3–1.2)
Total Protein: 5.3 g/dL — ABNORMAL LOW (ref 6.5–8.1)

## 2020-06-19 LAB — CBC WITH DIFFERENTIAL/PLATELET
Abs Immature Granulocytes: 0.07 10*3/uL (ref 0.00–0.07)
Basophils Absolute: 0 10*3/uL (ref 0.0–0.1)
Basophils Relative: 0 %
Eosinophils Absolute: 0 10*3/uL (ref 0.0–0.5)
Eosinophils Relative: 0 %
HCT: 30.9 % — ABNORMAL LOW (ref 36.0–46.0)
Hemoglobin: 9.3 g/dL — ABNORMAL LOW (ref 12.0–15.0)
Immature Granulocytes: 1 %
Lymphocytes Relative: 5 %
Lymphs Abs: 0.8 10*3/uL (ref 0.7–4.0)
MCH: 26.3 pg (ref 26.0–34.0)
MCHC: 30.1 g/dL (ref 30.0–36.0)
MCV: 87.3 fL (ref 80.0–100.0)
Monocytes Absolute: 1.1 10*3/uL — ABNORMAL HIGH (ref 0.1–1.0)
Monocytes Relative: 7 %
Neutro Abs: 13.1 10*3/uL — ABNORMAL HIGH (ref 1.7–7.7)
Neutrophils Relative %: 87 %
Platelets: 291 10*3/uL (ref 150–400)
RBC: 3.54 MIL/uL — ABNORMAL LOW (ref 3.87–5.11)
RDW: 17.7 % — ABNORMAL HIGH (ref 11.5–15.5)
WBC: 15.2 10*3/uL — ABNORMAL HIGH (ref 4.0–10.5)
nRBC: 0 % (ref 0.0–0.2)

## 2020-06-19 LAB — GLUCOSE, CAPILLARY
Glucose-Capillary: 92 mg/dL (ref 70–99)
Glucose-Capillary: 89 mg/dL (ref 70–99)

## 2020-06-19 LAB — C DIFFICILE (CDIFF) QUICK SCRN (NO PCR REFLEX)
C Diff antigen: POSITIVE — AB
C Diff toxin: NEGATIVE

## 2020-06-19 LAB — LACTIC ACID, PLASMA: Lactic Acid, Venous: 1.2 mmol/L (ref 0.5–1.9)

## 2020-06-19 LAB — LIPASE, BLOOD: Lipase: 19 U/L (ref 11–51)

## 2020-06-19 LAB — AMYLASE: Amylase: 72 U/L (ref 28–100)

## 2020-06-19 LAB — PROCALCITONIN: Procalcitonin: 7.15 ng/mL

## 2020-06-19 MED ORDER — LORAZEPAM 2 MG/ML IJ SOLN
1.0000 mg | Freq: Once | INTRAMUSCULAR | Status: AC
  Administered 2020-06-19: 1 mg via INTRAVENOUS
  Filled 2020-06-19: qty 1

## 2020-06-19 MED ORDER — ACETAMINOPHEN 10 MG/ML IV SOLN
1000.0000 mg | Freq: Four times a day (QID) | INTRAVENOUS | Status: AC
  Administered 2020-06-19 (×4): 1000 mg via INTRAVENOUS
  Filled 2020-06-19 (×5): qty 100

## 2020-06-19 MED ORDER — ACETAMINOPHEN 10 MG/ML IV SOLN
INTRAVENOUS | Status: AC
  Filled 2020-06-19: qty 100

## 2020-06-19 MED ORDER — IOHEXOL 9 MG/ML PO SOLN
ORAL | Status: AC
  Filled 2020-06-19: qty 1000

## 2020-06-19 MED ORDER — HYDROMORPHONE HCL 1 MG/ML IJ SOLN
0.5000 mg | INTRAMUSCULAR | Status: DC | PRN
  Administered 2020-06-19 – 2020-06-21 (×9): 0.5 mg via INTRAVENOUS
  Filled 2020-06-19 (×9): qty 0.5

## 2020-06-19 MED ORDER — MORPHINE SULFATE (PF) 2 MG/ML IV SOLN
2.0000 mg | INTRAVENOUS | Status: DC | PRN
  Administered 2020-06-19: 2 mg via INTRAVENOUS
  Filled 2020-06-19: qty 1

## 2020-06-19 MED ORDER — METRONIDAZOLE IN NACL 5-0.79 MG/ML-% IV SOLN
500.0000 mg | Freq: Three times a day (TID) | INTRAVENOUS | Status: DC
  Administered 2020-06-19 – 2020-06-23 (×14): 500 mg via INTRAVENOUS
  Filled 2020-06-19 (×14): qty 100

## 2020-06-19 MED ORDER — NOREPINEPHRINE 4 MG/250ML-% IV SOLN
INTRAVENOUS | Status: AC
  Administered 2020-06-19: 2 ug/min via INTRAVENOUS
  Filled 2020-06-19: qty 250

## 2020-06-19 MED ORDER — HALOPERIDOL LACTATE 5 MG/ML IJ SOLN
5.0000 mg | Freq: Four times a day (QID) | INTRAMUSCULAR | Status: DC | PRN
  Administered 2020-06-19 – 2020-06-22 (×7): 5 mg via INTRAVENOUS
  Filled 2020-06-19 (×7): qty 1

## 2020-06-19 MED ORDER — SODIUM CHLORIDE 0.9 % IV BOLUS
500.0000 mL | Freq: Once | INTRAVENOUS | Status: AC
  Administered 2020-06-19: 500 mL via INTRAVENOUS

## 2020-06-19 MED ORDER — LACTATED RINGERS IV BOLUS
500.0000 mL | Freq: Once | INTRAVENOUS | Status: AC
  Administered 2020-06-19: 500 mL via INTRAVENOUS

## 2020-06-19 MED ORDER — NOREPINEPHRINE 4 MG/250ML-% IV SOLN: 0.0000 ug/min | INTRAVENOUS | Status: DC

## 2020-06-19 MED ORDER — SODIUM CHLORIDE 0.9 % IV SOLN: INTRAVENOUS | Status: DC

## 2020-06-19 NOTE — Progress Notes (Signed)
eLink Physician-Brief Progress Note Patient Name: Brenda Hopkins DOB: 01/01/1941 MRN: [id number]   Date of Service  06/19/2020  HPI/Events of Note  Lab studies: WBC = 15.2. Lactic Acid = 1.2. Abdominal film: 1. Nonobstructive bowel gas pattern. 2. Infiltration or atelectasis in both lung bases.  eICU Interventions  Lab finding not diagnostic except elevated WBC. Patient is already on Abx Rx. Further management per Hospitalist.      Intervention Category Major Interventions: Other:  [name] Dennard Nip 06/19/2020, 2:18 AM

## 2020-06-19 NOTE — Progress Notes (Signed)
0150 Hospitalist Frankey Shown, DO at bedside.

## 2020-06-19 NOTE — Progress Notes (Signed)
06/18/2020 10:13 PM RN called due to patient being agitated and pulling on lines/tubings.  Bilateral soft restraints were ordered. Haldol 2 mg IM was given  11:17 PM RN states that patient was still agitated and that Haldol did not work.  EKG was ordered to ensure that patient does not go into prolonged QT, in case more Haldol will be needed.  11:37 PM RN called back and states that patient's HR is in the 140s and that it is because patient was withdrawing from opioids.  She was asked to monitor patient and try to find out why patient was uncomfortable, since this could be part of the reason why patient was agitated.  She states that it looks like patient was in pain, she was asked to give fentanyl as already indicated in [facility] and to continue to monitor patient.  It was also explained to patient that sundowning could be a contributing factor.   06/19/2020 1:37 AM RN called to come to bedside. eLink physician already consulted.  At bedside, patient was agitated, and she complained of pain which was nonspecific.  There was no tenderness to palpation of the abdomen, she endorsed a discomfort with a Flexi-Seal (patient continues to have bloody stool).  Lipase, amylase and lactic acid done within normal.  Procalcitonin was repeated and it was 7.15 (this was 5.85 yesterday), patient was also noted to be febrile.  IV Tylenol was given, patient was already on antibiotics, blood culture was pending.  IV morphine 2 mg x 1 was given due to pain.  IV hydration 500 mL maintenance was started due to patient being clinically dehydrated.  Abdominal x-ray done showed nonobstructive bowel gas pattern.  3:18 AM RN called due to to patient still complaining of pain.  Patient was started on IV Dilaudid as needed.  5:35 AM RN called that patient was hypotensive with a BP of 74/43; IV bolus of 500 mL was given, with improvement of BP to 126/90.

## 2020-06-19 NOTE — Progress Notes (Signed)
Subjective: Patient received Ativan during MRI so unable to provide information. Family at bedside. Nursing state no complaints of abdominal pain this morning and had been very active overnight. Agitated overnight. Appears patient reporting pain overnight. Multiple nursing notes reviewed. Agitated and combative overnight. On pressors due to hypotension. Central line placed this morning. Febrile overnight. Yesterday evening, foley placed with 1 liter returned and improvement in abdominal during evening.   Objective: Vital signs in last 24 hours: Temp:  [98.2 F (36.8 C)-100.4 F (38 C)] 98.4 F (36.9 C) (04/08 0755) Pulse Rate:  [56-140] 77 (04/08 0755) Resp:  [10-34] 24 (04/08 0755) BP: (74-149)/(38-90) 98/67 (04/08 0645) SpO2:  [90 %-100 %] 95 % (04/08 1034) Weight:  [63.5 kg] 63.5 kg (04/08 0440) Last BM Date: 06/19/20 General:   Resting in bed, unable to provide information  Head:  Normocephalic and atraumatic. Abdomen:  Bowel sounds present, no TTP with palpation, no rebound or guarding. Ventral hernia noted and reducible, no grimacing with palpation. Dark brown/green stool in flexi-seal bag, with pinkish-tinged liquid in tubing from rectum Neurologic:  Unable to assess.    Intake/Output from previous day: 04/07 0701 - 04/08 0700 In: 724.8 [I.V.:457.5; IV Piggyback:267.4] Out: 1450 [Urine:1250; Stool:200] Intake/Output this shift: No intake/output data recorded.  Lab Results: Recent Labs    06/18/20 0505 06/18/20 1118 06/19/20 0145  WBC 19.5* 18.8* 15.2*  HGB 10.2* 9.8* 9.3*  HCT 34.5* 32.5* 30.9*  PLT 299 284 291   BMET Recent Labs    06/17/20 0648 06/18/20 0505 06/19/20 0145  NA 137 131* 135  K 4.6 5.2* 4.0  CL 89* 87* 94*  CO2 35* 30 31  GLUCOSE 84 182* 95  BUN 19 27* 30*  CREATININE 1.16* 1.74* 1.68*  CALCIUM 8.7* 8.7* 8.1*   LFT Recent Labs    06/18/20 0505 06/19/20 0145  PROT 5.3* 5.3*  ALBUMIN 2.7* 2.4*  AST 23 26  ALT 15 17  ALKPHOS  157* 113  BILITOT 1.3* 1.0     Studies/Results: CT HEAD WO CONTRAST  Result Date: 06/18/2020 CLINICAL DATA:  Inpatient. Mental status change after EGD 06/17/2020. Restless. No reported injury. EXAM: CT HEAD WITHOUT CONTRAST TECHNIQUE: Contiguous axial images were obtained from the base of the skull through the vertex without intravenous contrast. COMPARISON:  None. FINDINGS: Brain: Small focus of cortical hypodensity in right occipital lobe (series 2/image 11). Nonspecific moderate subcortical and periventricular white matter hypodensity, most in keeping with chronic small vessel ischemic change. No evidence of parenchymal hemorrhage or extra-axial fluid collection. No mass lesion, mass effect, or midline shift. Cerebral volume is age appropriate. No ventriculomegaly. Vascular: No acute abnormality. Skull: No evidence of calvarial fracture. Sinuses/Orbits: The visualized paranasal sinuses are essentially clear. Other:  The mastoid air cells are unopacified. IMPRESSION: 1. Small focus of cortical hypodensity in the right occipital lobe, cannot exclude acute/subacute infarct. No acute intracranial hemorrhage. MRI brain without and with IV contrast could be considered for further evaluation as clinically warranted. 2. Moderate chronic small vessel ischemic changes in the cerebral white matter. These results will be called to the ordering clinician or representative by the Radiologist Assistant, and communication documented in the PACS or [employer]. Electronically Signed   By: Delbert Phenix M.D.   On: 06/18/2020 17:53   MR BRAIN WO CONTRAST  Result Date: 06/19/2020 CLINICAL DATA:  Mental status change. EXAM: MRI HEAD WITHOUT CONTRAST TECHNIQUE: Multiplanar, multiecho pulse sequences of the brain and surrounding structures were obtained without intravenous  contrast. COMPARISON:  Head CT June 18, 2020 FINDINGS: The study is partially degraded by motion. Brain: No acute infarction, hemorrhage,  hydrocephalus, extra-axial collection or mass lesion. Scattered and confluent foci of T2 hyperintensity are seen within the white matter of the cerebral hemispheres and within the pons, nonspecific, most likely related to chronic small vessel ischemia. 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 Hypodense area described on prior CT corresponds to prominent arachnoid granulation in the right transverse sinus. Prominent vascular susceptibility artifact on the GRE sequence is related to recent administration of ferumoxytol seizure. Vascular: Normal flow voids. Skull and upper cervical spine: Normal marrow signal. Sinuses/Orbits: Negative. Other: None. IMPRESSION: 1. No acute intracranial abnormality. 2. 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 3. Moderate chronic small vessel ischemia. Electronically Signed   By: Baldemar Lenis M.D.   On: 06/19/2020 09:52   DG CHEST PORT 1 VIEW  Result Date: 06/18/2020 CLINICAL DATA:  Shortness of breath, hypoxia, history hypertension, smoker EXAM: PORTABLE CHEST 1 VIEW COMPARISON:  Portable exam 1043 hours compared to 06/15/2020 FINDINGS: Upper normal heart size. Mediastinal contours and pulmonary vascularity normal. Atherosclerotic calcification aorta. Bronchitic changes with bibasilar atelectasis. Remaining lungs clear. No pleural effusion or pneumothorax. Bones demineralized with advanced RIGHT glenohumeral degenerative changes. IMPRESSION: Persistent bronchitic changes and bibasilar atelectasis. Aortic Atherosclerosis (ICD10-I70.0). Electronically Signed   By: Ulyses Southward M.D.   On: 06/18/2020 11:13   DG Abd 2 Views  Result Date: 06/19/2020 CLINICAL DATA:  Abdominal pain and altered mental status. EXAM: ABDOMEN - 2 VIEW COMPARISON:  CT abdomen and pelvis 01/30/2020 FINDINGS: Scattered gas and stool in the colon. No definite small or large bowel dilatation although paucity of gas limits  evaluation. No free intra-abdominal air. No abnormal air-fluid levels. No radiopaque stones. There is evidence of infiltration or atelectasis in both lung bases. Postoperative changes in the lumbosacral spine. Degenerative changes in the lumbar spine and hips. IMPRESSION: 1. Nonobstructive bowel gas pattern. 2. Infiltration or atelectasis in both lung bases. Electronically Signed   By: Burman Nieves M.D.   On: 06/19/2020 02:10    Assessment: 80 year old female with history of Roux-en-Y gastric bypass, anastomotic ulcer on EGD November 2021, presenting with acute on chronic heart failure, GI consulted for epigastric pain, nausea and vomiting. 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 Notably, she had been taking Celebrex since February.   GI bleeding: developed rectal bleeding yesterday, hypotensive, and transferred to ICU. Today, stool in bag appears brown/green, and pinkish-tinged output noted in actual flexi-seal tubing coming from rectum. Hgb 9.3 today, yesterday 10.2. Overall, she has been fluctuating since admission and no significant change. Not consistent with a rapid transit UGI bleed and suspect more benign anorectal bleeding source from below.   Abdominal pain: reporting overnight, quite agitated. CT pending currently. Difficult historian due to mental status changes. RUQ [place] negative, no stones. LFTs normal, lipase normal.   Acute metabolic encephalopathy: onset yesterday, marked change from admission. CT brain yesterday and MRI brain without contrast today degraded by motion but no acute findings. Further evaluation per attending.   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  Likely ongoing NSAID use, Celebrex since February.  Right upper quadrant ultrasound this admission with no acute  findings, no cholelithiasis.  LFTs  remain normal.  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  No evidence of bleeding during EGD or after dilation. With abdominal pain and known Roux-en-Y anatomy would need to keep in mind potential for internal hernia.  Sepsis: CT abd/pelvis and chest pending. On pressor support currently.    Plan: Follow CBC, transfuse as needed Continue PPI infusion Await CT chest/abd/pelvis Monitor for significant overt GI bleeding Appreciate palliative involvement  Gelene Mink, PhD, ANP-BC [facility]    LOS: 7 days    06/19/2020, 10:43 AM

## 2020-06-19 NOTE — Procedures (Signed)
Procedure Note  06/19/20   Preoperative Diagnosis:  Septic shock, AKI, Congestive heart failure    Postoperative Diagnosis: Same   Procedure(s) Performed: Central Line placement, right internal jugular vein    Surgeon: Leatrice Jewels. Henreitta Leber, MD   Assistants: None   Anesthesia: 1% lidocaine    Complications: None    Indications: Ms. Brenda Hopkins is a 80 y.o. with septic shock of unknown source, AKI, CHF who is on pressors. I discussed the risk and benefits of placement of the central line with her daugther, including but not limited to bleeding, infection, and risk of pneumothorax. She has given consent for the procedure.    Procedure: The patient placed supine. The right chest and neck was prepped and draped in the usual sterile fashion.  Wearing full gown and gloves, I performed the procedure.  One percent lidocaine was used for local anesthesia.  I attempted subclavian access but could not get the wire to thread, and got into the artery at one point. I then attempted jugular access. An ultrasound was utilized to assess the jugular vein.  The needle with syringe was advanced into the vein with dark venous return, and a wire was placed using the Seldinger technique without difficulty.  Ectopia was noted and the wire was pulled back.  The skin was knicked and a dilator was placed, and the three lumen catheter was placed over the wire with continued control of the wire.  There was good draw back of blood from all three lumens and each flushed easily with saline.  The catheter was secured in 4 points with 2-0 silk and a biopatch and dressing was placed.     The patient tolerated the procedure well, and the CXR was ordered to confirm position of the central line.   Algis Greenhouse, MD [facility] [address] [place], [place] [address] [phone] (office)

## 2020-06-19 NOTE — Progress Notes (Signed)
Patient hypotensive, BP 74/43 (49). Hospitalist Frankey Shown, DO notified. bolus given per provider order, see eMAR.

## 2020-06-19 NOTE — Progress Notes (Addendum)
PROGRESS NOTE  Brenda Hopkins ZOX:[id number] DOB: May 04, 1940 DOA: 06/12/2020 PCP: Shawnie Dapper, PA-C  Brief History: 80 y.o.female,with medical history significant forhypertension, anxiety, marginalulcer, tobacco abuseandchronic paindue to complaints of shortness of breathof one week duration. Shereports dyspneawith exertion, and laying flat, as well she does report some epigastric/lower chest pain, does not radiate, as well she does report worsening lower extremity edema.she was recently seen by her PCP for lower extremity edema which she was started on Lasix for last 4 days, report was told to have A. fib, and referral has been made to cardiologist but she did not get to see themprior to admission. She has a significant tobacco hx of >50 pack years. In the ED,BNP of 1270, chest x-ray significant for vascular congestion, EKG nonacute, troponins non-ACS pattern 71>63,was given IV Lasix, and [facility] hospitalist consulted to admit for CHF On 06/18/20, the patient was moved to ICU due to concerns of hematochezia and low BP.  She became more encephalopathic which was a change from her prior mental status.  Work up for AMS was undertaken.  It was felt this was primarily driven by new septic like clinical picture.  The patient was started on empiric vanc and cefepime initially.  She remained hypotensive despite fluid resuscitation and levophed was started.  GOC was discussed with patient's daughter and patient was changed to DNR.  Palliative medicine was consulted.  Assessment/Plan: Acute metabolic Encephalopathy -06/18/20 patient is confused which is a change from last 24 hours -06/18/20 ABG  7.461/48/56/33 (0.36) -EKG-sinus, nonspecific ST changes -CXR-bibasilar atelectasis; increased interstital/bronchitic markings -ammonia--23 -B12 205 -check PCT5.85>>7.15 -d/c gabapentin, seroquel. Requip, oxycodoone -CT brain--Small focus of cortical hypodensity in the right  occipital lobe  Acute on chronic diastolic CHF -Stable, improved, on 4L -BNP 1270 >>>589, 282, 198 -NEG 11L since admission prior to transfer to ICU 4/7 -Continue IV Lasix40 mg daily>>po lasix 06/17/20--now on hold due to hypotension/sepsis -Monitoring I's and O's, daily weight -2D echo--ejection fraction 55-60%,G1DD, no WMA, mild elevated PASP -appreciateconsulting cardiology -CTAchest--negpulmonary embolis;cardiomegaly, severe coronary atherosclerosis, aortic atherosclerosis,.. Bilateral sublingual atelectasis, T9 vertebral compression deformity  Acute respiratory failure with hypoxia  -due to CHF in the setting of underlying COPD -increased to 4L am 06/18/20 -CXR--personally reviewed--increase RLL atelectasis, ?LLL opacity -check ABG  7.461/48/56/33 (0.36) -wean for saturation >90% -continue bronchodilators  Hematochezia -GI notified, following -developed evening 06/17/20 -06/17/20 EGD--s/p Roux-en-Y with ulcer (decreased in size) and stenosis at GJ anastomosis  -Hgb 10.2 in am 06/18/20>>9.3 -serial H/H -continue protonix drip -2 view AXR--no free air; nonobstructive  Sepsis -check PCT 5.85>>7.15 -lactate 1.2 -suspect intraabdominal source vs pneumonia  -4/7 blood culture--neg to date -4/7 UA--no pyuria -continue empiric vanc and cefepime -MRSA screen -2 view AXR--no free air; nonobstructive -CT chest/abd -start levophed -judicious IVF  AKI -baseline creatinine 0.6-0.9 -peaking at 1.74>>1.68 -due to hypotension, volume depletion -hold lasix  Questionable Atrial Fibrillation -No definitive atrial fibrillation noted on telemetry. -appreciate cardiology input -One strip labeled as 10 beats NSVT overnight but appears more consistent with narrow-complex tachycardia. K+was5.7 on 4/5/22and Kayexalate has been administered. Mg stable at 2.1. -personally reviewed EKG--sinus, PACs, nonspecific T wave changes  Coronary Calcification on CT -has intermittent CP,  reproducible, partly due to GI etiology also -troponins flat -outpt stress per cardiology -no anginal symptoms  Epigastricandchest pain -06/17/20 EGD--s/p Roux-en-Y with ulcer and stenosis at GJ anastomosis  -Post prandial with every meal developed gastric pain then followed with nausea vomiting -  ContinueProtonix, Carafate -Consulting gastroenterology >>>appreciate input -Ultrasound right upper quadrant negative for cholelithiasis or cholecystitis  Iron deficiency Anemia -iron saturation 7 -ferritin 12 -feraheme x 1 on 06/17/20  Hyperlipidemia -continue statin  HTN -holding amlodipine  Chronic pain syndrome -continue gabapentin -continue home dose oxycodone -PMDP reviewed  Hyperkalemia -improved with kayexalate      Status is: Inpatient  Remains inpatient appropriate because:IV treatments appropriate due to intensity of illness or inability to take PO   Dispo: The patient is from:Home Anticipated d/c is XM:IWOE Patient currently is not medically stable to d/c. Difficult to place patient No    Total time spent 35 minutes.  Greater than 50% spent face to face counseling and coordinating care.    Family Communication:daughter updated 06/19/20  Consultants:cardiology  Code Status: DNR  DVT Prophylaxis: SCDs   Procedures: As Listed in Progress Note Above  Antibiotics: Cefepime 4/7>> vanc 4/7>> Metronidazole 4/8>>    Subjective: Patient is more alert and less agitated but remains a bit confused.  Denies cp, sob, abd pain, n/v, headache  Objective: Vitals:   06/19/20 0600 06/19/20 0615 06/19/20 0630 06/19/20 0645  BP: (!) 89/46 (!) 92/51 (!) 82/46 98/67  Pulse: 80 74 91   Resp: 10 11 10 20   Temp:      TempSrc:      SpO2: 95% 94% 95% 95%  Weight:      Height:        Intake/Output Summary (Last 24 hours) at 06/19/2020 0731 Last data filed at 06/19/2020 0600 Gross per 24  hour  Intake 724.83 ml  Output 1450 ml  Net -725.17 ml   Weight change:  Exam:   General:  Pt is alert, follows commands appropriately, not in acute distress  HEENT: No icterus, No thrush, No neck mass, [place]/AT  Cardiovascular: RRR, S1/S2, no rubs, no gallops  Respiratory: bibasilar crackles. No wheeze  Abdomen: Soft/+BS, non tender, non distended, no guarding  Extremities: No edema, No lymphangitis, No petechiae, No rashes, no synovitis   Data Reviewed: I have personally reviewed following labs and imaging studies Basic Metabolic Panel: Recent Labs  Lab 06/16/20 0454 06/16/20 1344 06/17/20 0648 06/18/20 0505 06/19/20 0145  NA 135 141 137 131* 135  K 5.7* 4.8 4.6 5.2* 4.0  CL 89* 91* 89* 87* 94*  CO2 36* 36* 35* 30 31  GLUCOSE 94 82 84 182* 95  BUN 19 19 19  27* 30*  CREATININE 1.05* 1.03* 1.16* 1.74* 1.68*  CALCIUM 8.7* 8.8* 8.7* 8.7* 8.1*  MG 2.1  --   --   --   --    Liver Function Tests: Recent Labs  Lab 06/12/20 1413 06/15/20 1204 06/18/20 0505 06/19/20 0145  AST 31 16 23 26   ALT 24 15 15 17   ALKPHOS 109 99 157* 113  BILITOT 0.6 0.6 1.3* 1.0  PROT 6.5 6.4* 5.3* 5.3*  ALBUMIN 3.1* 3.0* 2.7* 2.4*   Recent Labs  Lab 06/19/20 0145  LIPASE 19  AMYLASE 72   Recent Labs  Lab 06/18/20 1118  AMMONIA 23   Coagulation Profile: No results for input(s): INR, PROTIME in the last 168 hours. CBC: Recent Labs  Lab 06/12/20 1413 06/13/20 0606 06/15/20 1204 06/16/20 0951 06/18/20 0505 06/18/20 1118 06/19/20 0145  WBC 8.5 5.0 4.6  --  19.5* 18.8* 15.2*  NEUTROABS 6.9 2.9 3.1  --  17.3*  --  13.1*  HGB 9.1* 8.0* 10.3* 9.6* 10.2* 9.8* 9.3*  HCT 31.8* 28.0* 36.6 33.1* 34.5* 32.5* 30.9*  MCV 92.2 92.1 92.7  --  89.1 87.1 87.3  PLT 366 284 310  --  299 284 291   Cardiac Enzymes: No results for input(s): CKTOTAL, CKMB, CKMBINDEX, TROPONINI in the last 168 hours. BNP: Invalid input(s): POCBNP CBG: Recent Labs  Lab 06/18/20 0453 06/18/20 0829  06/18/20 1035 06/18/20 1646 06/18/20 2016  GLUCAP 158* 180* 176* 118* 108*   HbA1C: No results for input(s): HGBA1C in the last 72 hours. Urine analysis:    Component Value Date/Time   COLORURINE AMBER (A) 06/18/2020 1219   APPEARANCEUR CLEAR 06/18/2020 1219   LABSPEC 1.010 06/18/2020 1219   PHURINE 5.0 06/18/2020 1219   GLUCOSEU NEGATIVE 06/18/2020 1219   HGBUR SMALL (A) 06/18/2020 1219   BILIRUBINUR NEGATIVE 06/18/2020 1219   KETONESUR NEGATIVE 06/18/2020 1219   PROTEINUR NEGATIVE 06/18/2020 1219   NITRITE NEGATIVE 06/18/2020 1219   LEUKOCYTESUR NEGATIVE 06/18/2020 1219   Sepsis Labs: @LABRCNTIP (procalcitonin:4,lacticidven:4) ) Recent Results (from the past 240 hour(s))  Blood culture (routine x 2)     Status: None   Collection Time: 06/12/20  3:05 PM   Specimen: BLOOD LEFT HAND  Result Value Ref Range Status   Specimen Description BLOOD LEFT HAND  Final   Special Requests   Final    Blood Culture adequate volume BOTTLES DRAWN AEROBIC AND ANAEROBIC   Culture   Final    NO GROWTH 5 DAYS Performed at [facility], [address] [place], [place]    Report Status 06/17/2020 FINAL  Final  Blood culture (routine x 2)     Status: None   Collection Time: 06/12/20  3:05 PM   Specimen: BLOOD RIGHT HAND  Result Value Ref Range Status   Specimen Description BLOOD RIGHT HAND  Final   Special Requests   Final    Blood Culture results may not be optimal due to an inadequate volume of blood received in culture bottles BOTTLES DRAWN AEROBIC AND ANAEROBIC   Culture   Final    NO GROWTH 5 DAYS Performed at [facility], [address] [place], [place]    Report Status 06/17/2020 FINAL  Final  Resp Panel by RT-PCR (Flu A&B, Covid) Nasopharyngeal Swab     Status: None   Collection Time: 06/12/20  3:51 PM   Specimen: Nasopharyngeal Swab; Nasopharyngeal(NP) swabs in vial transport medium  Result Value Ref Range Status   SARS Coronavirus 2 by RT PCR NEGATIVE  NEGATIVE Final    Comment: (NOTE) SARS-CoV-2 target nucleic acids are NOT DETECTED.  The SARS-CoV-2 RNA is generally detectable in upper respiratory specimens during the acute phase of infection. The lowest concentration of SARS-CoV-2 viral copies this assay can detect is 138 copies/mL. A negative result does not preclude SARS-Cov-2 infection and should not be used as the sole basis for treatment or other patient management decisions. A negative result may occur with  improper specimen collection/handling, submission of specimen other than nasopharyngeal swab, presence of viral mutation(s) within the areas targeted by this assay, and inadequate number of viral copies(<138 copies/mL). A negative result must be combined with clinical observations, patient history, and epidemiological information. The expected result is Negative.  Fact Sheet for Patients:  08/12/20  Fact Sheet for Healthcare Providers:  BloggerCourse.com  This test is no t yet approved or cleared by the [place] FDA and  has been authorized for detection and/or diagnosis of SARS-CoV-2 by FDA under an Emergency Use Authorization (EUA). This EUA will remain  in effect (meaning this test  can be used) for the duration of the COVID-19 declaration under Section 564(b)(1) of the Act, 21 U.S.C.section 360bbb-3(b)(1), unless the authorization is terminated  or revoked sooner.       Influenza A by PCR NEGATIVE NEGATIVE Final   Influenza B by PCR NEGATIVE NEGATIVE Final    Comment: (NOTE) The Xpert Xpress SARS-CoV-2/FLU/RSV plus assay is intended as an aid in the diagnosis of influenza from Nasopharyngeal swab specimens and should not be used as a sole basis for treatment. Nasal washings and aspirates are unacceptable for Xpert Xpress SARS-CoV-2/FLU/RSV testing.  Fact Sheet for Patients: BloggerCourse.com  Fact Sheet for Healthcare  Providers: SeriousBroker.it  This test is not yet approved or cleared by the [place] FDA and has been authorized for detection and/or diagnosis of SARS-CoV-2 by FDA under an Emergency Use Authorization (EUA). This EUA will remain in effect (meaning this test can be used) for the duration of the COVID-19 declaration under Section 564(b)(1) of the Act, 21 U.S.C. section 360bbb-3(b)(1), unless the authorization is terminated or revoked.  Performed at [facility], [address], [place], [place] [address]   Culture, blood (Routine X 2) w Reflex to ID Panel     Status: None (Preliminary result)   Collection Time: 06/18/20 11:18 AM   Specimen: BLOOD RIGHT HAND  Result Value Ref Range Status   Specimen Description BLOOD RIGHT HAND  Final   Special Requests   Final    Blood Culture results may not be optimal due to an inadequate volume of blood received in culture bottles BOTTLES DRAWN AEROBIC AND ANAEROBIC Performed at [facility], [address] [place], [place] [address]    Culture PENDING  Incomplete   Report Status PENDING  Incomplete  Culture, blood (Routine X 2) w Reflex to ID Panel     Status: None (Preliminary result)   Collection Time: 06/18/20 11:18 AM   Specimen: BLOOD RIGHT WRIST  Result Value Ref Range Status   Specimen Description BLOOD RIGHT WRIST  Final   Special Requests   Final    Blood Culture results may not be optimal due to an inadequate volume of blood received in culture bottles BOTTLES DRAWN AEROBIC AND ANAEROBIC Performed at [facility], [address] [place], [place] [address]    Culture PENDING  Incomplete   Report Status PENDING  Incomplete     Scheduled Meds: . atorvastatin  20 mg Oral QHS  . budesonide (PULMICORT) nebulizer solution  0.5 mg Nebulization BID  . Chlorhexidine Gluconate Cloth  6 each Topical Daily  . ipratropium-albuterol  3 mL Nebulization TID  . metoprolol tartrate  12.5 mg Oral BID  . [START ON  06/21/2020] pantoprazole  40 mg Intravenous Q12H  . sodium chloride flush  3 mL Intravenous Q12H  . sucralfate  1 g Oral TID with meals   Continuous Infusions: . sodium chloride Stopped (06/18/20 1900)  . sodium chloride 75 mL/hr at 06/19/20 0600  . acetaminophen Stopped (06/19/20 0349)  . ceFEPime (MAXIPIME) IV Stopped (06/18/20 1900)  . metronidazole    . norepinephrine    . norepinephrine (LEVOPHED) Adult infusion    . pantoprozole (PROTONIX) infusion 8 mg/hr (06/19/20 0600)  . [START ON 06/20/2020] vancomycin      Procedures/Studies: DG Chest 1 View  Result Date: 06/12/2020 CLINICAL DATA:  Shortness of breath EXAM: CHEST  1 VIEW COMPARISON:  January 15, 2020 FINDINGS: There is ill-defined airspace opacity in the right upper lobe. There is scarring in the left base. The  heart is enlarged with pulmonary vascularity normal, stable. There is aortic atherosclerosis. Bones are osteoporotic. There is extensive arthropathy in the right shoulder. IMPRESSION: Ill-defined airspace opacity consistent with pneumonia right upper lobe. Scarring left base. Stable cardiac enlargement. Bones osteoporotic. Aortic Atherosclerosis (ICD10-I70.0). Electronically Signed   By: Bretta BangWilliam  Woodruff III M.D.   On: 06/12/2020 14:47   CT HEAD WO CONTRAST  Result Date: 06/18/2020 CLINICAL DATA:  Inpatient. Mental status change after EGD 06/17/2020. Restless. No reported injury. EXAM: CT HEAD WITHOUT CONTRAST TECHNIQUE: Contiguous axial images were obtained from the base of the skull through the vertex without intravenous contrast. COMPARISON:  None. FINDINGS: Brain: Small focus of cortical hypodensity in right occipital lobe (series 2/image 11). Nonspecific moderate subcortical and periventricular white matter hypodensity, most in keeping with chronic small vessel ischemic change. No evidence of parenchymal hemorrhage or extra-axial fluid collection. No mass lesion, mass effect, or midline shift. Cerebral volume is age  appropriate. No ventriculomegaly. Vascular: No acute abnormality. Skull: No evidence of calvarial fracture. Sinuses/Orbits: The visualized paranasal sinuses are essentially clear. Other:  The mastoid air cells are unopacified. IMPRESSION: 1. Small focus of cortical hypodensity in the right occipital lobe, cannot exclude acute/subacute infarct. No acute intracranial hemorrhage. MRI brain without and with IV contrast could be considered for further evaluation as clinically warranted. 2. Moderate chronic small vessel ischemic changes in the cerebral white matter. These results will be called to the ordering clinician or representative by the Radiologist Assistant, and communication documented in the PACS or [employer] Dashboard. Electronically Signed   By: Delbert PhenixJason A Poff M.D.   On: 06/18/2020 17:53   CT Angio Chest PE W and/or Wo Contrast  Result Date: 06/12/2020 CLINICAL DATA:  80 year old female with shortness of breath. EXAM: CT ANGIOGRAPHY CHEST WITH CONTRAST TECHNIQUE: Multidetector CT imaging of the chest was performed using the standard protocol during bolus administration of intravenous contrast. Multiplanar CT image reconstructions and MIPs were obtained to evaluate the vascular anatomy. CONTRAST:  Seventy-five mL Omnipaque 350, intravenous COMPARISON:  01/04/2020 FINDINGS: Cardiovascular: Satisfactory opacification of the pulmonary arteries to the segmental level. No evidence of pulmonary embolism. Similar appearing moderate global cardiomegaly. Mitral annular calcifications. Severe coronary atherosclerotic calcifications. Scattered atherosclerotic calcifications of the thoracic aorta. No pericardial effusion. Mediastinum/Nodes: No enlarged mediastinal, hilar, or axillary lymph nodes. The esophagus is patulous throughout with a standing column of enteric contents. Thyroid gland and trachea demonstrate no significant findings. Lungs/Pleura: Bibasilar and lingular subsegmental atelectasis. Mild upper lobe  predominant centrilobular emphysema. No suspicious pulmonary nodules. No pleural effusion or pneumothorax. 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 The visualized upper abdomen is otherwise within normal limits. Musculoskeletal: Progressive compression deformity of the T9 vertebral body with associated kyphosis at this level, now nearly vertebra plana morphology. Mild, unchanged retropulsion. 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 Multilevel chronic, bilateral posterolateral nondisplaced rib fractures. Advanced degenerative changes of the glenohumeral joints bilaterally. No acute osseous abnormality. Review of the MIP images confirms the above findings. IMPRESSION: Vascular: 1. No evidence of pulmonary embolism. 2. Unchanged moderate global cardiomegaly. 3. Severe coronary atherosclerotic calcifications. 4.  Aortic Atherosclerosis (ICD10-I70.0). Non-Vascular: 1. Bibasilar and lingular subsegmental atelectasis. 2. Progressive compression deformity of the T9 vertebral body, now essentially vertebra plana. Unchanged mild retropulsion. Marliss Cootsylan Suttle, MD [facility] Electronically Signed   By: Marliss Cootsylan  Suttle MD   On: 06/12/2020 16:45   US Venous Img Lower  Left (DVT  Study)  Result Date: 06/12/2020 CLINICAL DATA:  Lower extremity pain and edema EXAM: LEFT LOWER EXTREMITY VENOUS DUPLEX ULTRASOUND TECHNIQUE: Gray-scale sonography with graded compression, as well as color Doppler and duplex ultrasound were performed to evaluate the left lower extremity deep venous system from the level of the common femoral vein and including the common femoral, femoral, profunda femoral, popliteal and calf veins including the posterior tibial, peroneal and gastrocnemius veins when visible. The superficial great saphenous vein was also interrogated. Spectral Doppler was  utilized to evaluate flow at rest and with distal augmentation maneuvers in the common femoral, femoral and popliteal veins. COMPARISON:  None. FINDINGS: Contralateral Common Femoral Vein: Respiratory phasicity is normal and symmetric with the symptomatic side. No evidence of thrombus. Normal compressibility. Common Femoral Vein: No evidence of thrombus. Normal compressibility, respiratory phasicity and response to augmentation. Saphenofemoral Junction: No evidence of thrombus. Normal compressibility and flow on color Doppler imaging. Profunda Femoral Vein: No evidence of thrombus. Normal compressibility and flow on color Doppler imaging. Femoral Vein: No evidence of thrombus. Normal compressibility, respiratory phasicity and response to augmentation. Popliteal Vein: No evidence of thrombus. Normal compressibility, respiratory phasicity and response to augmentation. Calf Veins: No evidence of thrombus. Normal compressibility and flow on color Doppler imaging. Superficial Great Saphenous Vein: No evidence of thrombus. Normal compressibility. Venous Reflux:  None. Other Findings:  Soft tissue edema noted in the calf region. IMPRESSION: No evidence of deep venous thrombosis in the left lower extremity. Right common femoral vein patent. There is left calf region soft tissue edema. Electronically Signed   By: Bretta Bang III M.D.   On: 06/12/2020 14:46   DG CHEST PORT 1 VIEW  Result Date: 06/18/2020 CLINICAL DATA:  Shortness of breath, hypoxia, history hypertension, smoker EXAM: PORTABLE CHEST 1 VIEW COMPARISON:  Portable exam 1043 hours compared to 06/15/2020 FINDINGS: Upper normal heart size. Mediastinal contours and pulmonary vascularity normal. Atherosclerotic calcification aorta. Bronchitic changes with bibasilar atelectasis. Remaining lungs clear. No pleural effusion or pneumothorax. Bones demineralized with advanced RIGHT glenohumeral degenerative changes. IMPRESSION: Persistent bronchitic changes and  bibasilar atelectasis. Aortic Atherosclerosis (ICD10-I70.0). Electronically Signed   By: Ulyses Southward M.D.   On: 06/18/2020 11:13   DG CHEST PORT 1 VIEW  Result Date: 06/15/2020 CLINICAL DATA:  Shortness of breath, hypertension, former smoker EXAM: PORTABLE CHEST 1 VIEW COMPARISON:  Portable exam 0656 hours compared to 06/12/2020 FINDINGS: Enlargement of cardiac silhouette. Mediastinal contours and pulmonary vascularity normal. Atherosclerotic calcification aorta. Bibasilar atelectasis. No acute infiltrate, pleural effusion, or pneumothorax. Bones demineralized with advanced degenerative changes of the RIGHT glenohumeral joint. IMPRESSION: Enlargement of cardiac silhouette with bibasilar atelectasis. Aortic Atherosclerosis (ICD10-I70.0). Electronically Signed   By: Ulyses Southward M.D.   On: 06/15/2020 08:06   DG Abd 2 Views  Result Date: 06/19/2020 CLINICAL DATA:  Abdominal pain and altered mental status. EXAM: ABDOMEN - 2 VIEW COMPARISON:  CT abdomen and pelvis 01/30/2020 FINDINGS: Scattered gas and stool in the colon. No definite small or large bowel dilatation although paucity of gas limits evaluation. No free intra-abdominal air. No abnormal air-fluid levels. No radiopaque stones. There is evidence of infiltration or atelectasis in both lung bases. Postoperative changes in the lumbosacral spine. Degenerative changes in the lumbar spine and hips. IMPRESSION: 1. Nonobstructive bowel gas pattern. 2. Infiltration or atelectasis in both lung bases. Electronically Signed   By: Burman Nieves M.D.   On: 06/19/2020 02:10   ECHOCARDIOGRAM COMPLETE  Result Date: 06/13/2020    ECHOCARDIOGRAM REPORT  Patient Name:   LAURETTE VILLESCAS Date of Exam: 06/13/2020 Medical Rec #:  [id number]           Height:       64.0 in Accession #:    [id number]          Weight:       157.0 lb Date of Birth:  [birth year]           BSA:          1.765 m Patient Age:    79 years            BP:           108/55 mmHg Patient Gender: F                    HR:           91 bpm. [facility] Procedure: 2D Echo Indications:    CHF-Acute Diastolic I50.31  History:        Patient has prior history of Echocardiogram examinations, most                 recent 01/16/2020. Risk Factors:Hypertension, Dyslipidemia and                 Former Smoker.  Sonographer:    Jeryl Columbia RDCS (AE) Referring Phys: [id number] IMPRESSIONS  1. Left ventricular ejection fraction, by estimation, is 55 to 60%. The left ventricle has normal function. The left ventricle has no regional wall motion abnormalities. There is mild concentric left ventricular hypertrophy of the basal-septal segment. Left ventricular diastolic parameters are consistent with Grade I diastolic dysfunction (impaired relaxation).  2. Right ventricular systolic function is normal. The right ventricular size is normal. There is mildly elevated pulmonary artery systolic pressure. The estimated right ventricular systolic pressure is 42.0 mmHg.  3. The mitral valve is grossly normal. No evidence of mitral valve regurgitation. No evidence of mitral stenosis. Severe mitral annular calcification.  4. The aortic valve was not well visualized. There is mild calcification of the aortic valve. Aortic valve regurgitation is not visualized. No aortic stenosis is present.  5. The inferior vena cava is dilated in size with <50% respiratory variability, suggesting right atrial pressure of 15 mmHg. Comparison(s): A prior study was performed on 01/16/2020. Left ventricle is less hyperdynamic. FINDINGS  Left Ventricle: Left ventricular ejection fraction, by estimation, is 55 to 60%. The left ventricle has normal function. The left ventricle has no regional wall motion abnormalities. The left ventricular internal cavity size was normal in size. There is  mild concentric left ventricular hypertrophy of the basal-septal segment. Left ventricular diastolic parameters are consistent with Grade I diastolic  dysfunction (impaired relaxation). Right Ventricle: The right ventricular size is normal. No increase in right ventricular wall thickness. Right ventricular systolic function is normal. There is mildly elevated pulmonary artery systolic pressure. The tricuspid regurgitant velocity is 2.60  m/s, and with an assumed right atrial pressure of 15 mmHg, the estimated right ventricular systolic pressure is 42.0 mmHg. Left Atrium: Left atrial size was normal in size. Right Atrium: Right atrial size was normal in size. Pericardium: There is no evidence of pericardial effusion. Mitral Valve: The mitral valve is grossly normal. There is mild thickening of the mitral valve leaflet(s). There is moderate calcification of the mitral valve leaflet(s). Severe mitral annular calcification. No evidence of mitral valve regurgitation. No evidence of mitral valve stenosis. MV peak gradient, 6.7  mmHg. The mean mitral valve gradient is 3.0 mmHg with average heart rate of 98 bpm. Tricuspid Valve: The tricuspid valve is grossly normal. Tricuspid valve regurgitation is trivial. No evidence of tricuspid stenosis. Aortic Valve: The aortic valve was not well visualized. There is mild calcification of the aortic valve. Aortic valve regurgitation is not visualized. No aortic stenosis is present. Pulmonic Valve: The pulmonic valve was not well visualized. Pulmonic valve regurgitation is not visualized. No evidence of pulmonic stenosis. Aorta: The aortic root is normal in size and structure. Venous: The inferior vena cava is dilated in size with less than 50% respiratory variability, suggesting right atrial pressure of 15 mmHg. IAS/Shunts: The atrial septum is grossly normal.  LEFT VENTRICLE PLAX 2D LVIDd:         4.18 cm  Diastology LVIDs:         2.99 cm  LV e' medial:    5.00 cm/s LV PW:         1.40 cm  LV E/e' medial:  18.3 LV IVS:        1.35 cm  LV e' lateral:   4.24 cm/s LVOT diam:     2.10 cm  LV E/e' lateral: 21.5 LVOT Area:     3.46 cm   RIGHT VENTRICLE RV S prime:     14.30 cm/s TAPSE (M-mode): 2.4 cm LEFT ATRIUM             Index       RIGHT ATRIUM           Index LA diam:        4.30 cm 2.44 cm/m  RA Area:     14.40 cm LA Vol (A2C):   52.3 ml 29.63 ml/m RA Volume:   35.60 ml  20.17 ml/m LA Vol (A4C):   48.3 ml 27.37 ml/m LA Biplane Vol: 52.1 ml 29.52 ml/m   AORTA Ao Root diam: 2.50 cm MITRAL VALVE                TRICUSPID VALVE MV Area (PHT): 3.36 cm     TR Peak grad:   27.0 mmHg MV Peak grad:  6.7 mmHg     TR Vmax:        260.00 cm/s MV Mean grad:  3.0 mmHg MV Vmax:       1.29 m/s     SHUNTS MV Vmean:      87.5 cm/s    Systemic Diam: 2.10 cm MV Decel Time: 226 msec MV E velocity: 91.30 cm/s MV A velocity: 120.00 cm/s MV E/A ratio:  0.76 Riley Lam MD Electronically signed by Riley Lam MD Signature Date/Time: 06/13/2020/3:04:50 PM    Final    US Abdomen Limited RUQ (LIVER/GB)  Result Date: 06/15/2020 CLINICAL DATA:  Diffuse abdominal pain. 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 EXAM: ULTRASOUND ABDOMEN LIMITED RIGHT UPPER QUADRANT COMPARISON:  Prior CT scan November 2021 FINDINGS: Gallbladder: No gallstones or wall thickening visualized. No sonographic Murphy sign noted by sonographer. Common bile duct: Diameter: Within normal limits at 5-6 mm Liver: No focal lesion identified. Within normal limits in parenchymal echogenicity. Portal vein is patent on color Doppler imaging with normal direction of blood flow towards the liver. Other: None. IMPRESSION: Negative right upper quadrant ultrasound. Electronically Signed   By: Malachy Moan M.D.   On: 06/15/2020 09:55    Catarina Hartshorn, DO  [facility]  If 7PM-7AM, please contact night-coverage [email or web address] Password [facility] 06/19/2020, 7:31 AM  LOS: 7 days

## 2020-06-19 NOTE — Progress Notes (Signed)
eLink Physician-Brief Progress Note Patient Name: Brenda Hopkins DOB: October 05, 1940 MRN: [id number]   Date of Service  06/19/2020  HPI/Events of Note  Asked to see patient d/t severe agitation and abdominal pain. Unable to examine the patient properly via camera. Nursing states that hospitalist is not available to speak with me. Confusing situation. Patient denies abdominal pain and exam by nursing seems reasonably unremarkable.   eICU Interventions  Plan: 1. Portable 2 view abdominal film now.  2. Send AM labs now. 3. Amylase, Lipase and Lactic Acid now.  Further recommendations when labs return.      Intervention Category Major Interventions: Other:  Lenell Antu 06/19/2020, 1:36 AM

## 2020-06-19 NOTE — Consult Note (Signed)
[facility] Consult  Reason for Consult: Abdominal pain, colitis/ ? Esophageal perforation  Referring Physician:  Dr. Arbutus Leas   Chief Complaint    Shortness of Breath      HPI: Brenda Hopkins is a 80 y.o. 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 Dr. 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  She has been in the hospital for several days but worsened overnight. She required central line placement this AM for pressor requirements, and had CT a/p/ chest and MRI done to determine etiology of sepsis and altered mental status.  When I saw her for CVL she was sleeping but agitated and kicking/ moving. She had received ativan for her imaging. She grimaced with all touch.   CT a/p/chest with distended gallbladder without thickening or fluid, colitis in the rectosigmoid region, and question of air around the esophagus versus torturous path of esophagus.   Past Medical History:  Diagnosis Date  . Chronic back pain   . GERD (gastroesophageal reflux disease)   . Hiatal hernia   . Hyperlipidemia   . Hypertension   . Pelvic fracture (HCC) 12/12/2012  . Smoker     Past Surgical History:  Procedure Laterality Date  . ABDOMINAL HYSTERECTOMY    . APPENDECTOMY    . ESOPHAGOGASTRODUODENOSCOPY (EGD) WITH PROPOFOL N/A 01/17/2020   Procedure: ESOPHAGOGASTRODUODENOSCOPY (EGD) WITH PROPOFOL;  Surgeon: Dolores Frame, MD;  Location: [facility];  Service: Gastroenterology;  Laterality: N/A;  . 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 HEMORROIDECTOMY    . LUMBAR SPINE SURGERY    . OOPHORECTOMY    . TONSILLECTOMY      History reviewed. No pertinent family history.  Social History   Tobacco Use  . Smoking status: Former Smoker    Packs/day: 0.50    Types: Cigarettes  . Smokeless tobacco: Former Neurosurgeon    Quit date: 12/28/2012  Vaping Use  . Vaping Use: Every day   Substance Use Topics  . Alcohol use: No  . Drug use: No    Medications:  I have reviewed the patient's current medications. Prior to Admission:  Medications Prior to Admission  Medication Sig Dispense Refill Last Dose  . gabapentin (NEURONTIN) 600 MG tablet TAKE 1 TABLET BY MOUTH THREE TIMES DAILY 90 tablet 5 06/11/2020 at Unknown time  . lisinopril (ZESTRIL) 10 MG tablet Take 10 mg by mouth daily.   06/11/2020 at Unknown time  . oxyCODONE (ROXICODONE) 15 MG immediate release tablet Take 15 mg by mouth 4 (four) times daily.   06/12/2020 at Unknown time  . pantoprazole (PROTONIX) 40 MG tablet Take 1 tablet (40 mg total) by mouth 2 (two) times daily. 180 tablet 2 Past Week at Unknown time  . sertraline (ZOLOFT) 25 MG tablet Take 25 mg by mouth daily.   06/11/2020 at Unknown time  . XARELTO STARTER PACK See admin instructions. follow package directions   06/11/2020 at Unknown time  . amLODipine (NORVASC) 10 MG tablet TAKE 1 TABLET BY MOUTH EVERY DAY 30 tablet 0   . atorvastatin (LIPITOR) 20 MG tablet TAKE 1 TABLET BY MOUTH EVERY NIGHT AT BEDTIME 30 tablet 0   . celecoxib (CELEBREX) 100 MG capsule Take 100 mg by mouth 2 (two) times daily.     . diphenhydrAMINE (BENADRYL) 25 mg capsule Take 50 mg by mouth every 6 (six) hours as needed.     Marland Kitchen  ondansetron (ZOFRAN) 4 MG tablet Take 1 tablet (4 mg total) by mouth every 6 (six) hours. (Patient not taking: No sig reported) 12 tablet 0 Not Taking at Unknown time  . potassium chloride SA (KLOR-CON) 20 MEQ tablet Take 20 mEq by mouth 2 (two) times daily. (Patient not taking: No sig reported)   Not Taking at Unknown time  . QUEtiapine (SEROQUEL) 200 MG tablet Take 200 mg by mouth at bedtime.     Marland Kitchen rOPINIRole (REQUIP) 3 MG tablet Take 3 mg by mouth at bedtime.     . sucralfate (CARAFATE) 1 g tablet Take 1 tablet (1 g total) by mouth 2 (two) times daily. (Patient not taking: Reported on 06/12/2020) 60 tablet 0 Not Taking at Unknown time   Scheduled: .  atorvastatin  20 mg Oral QHS  . budesonide (PULMICORT) nebulizer solution  0.5 mg Nebulization BID  . Chlorhexidine Gluconate Cloth  6 each Topical Daily  . iohexol      . ipratropium-albuterol  3 mL Nebulization TID  . [START ON 06/21/2020] pantoprazole  40 mg Intravenous Q12H  . sodium chloride flush  3 mL Intravenous Q12H  . sucralfate  1 g Oral TID with meals   Continuous: . sodium chloride Stopped (06/18/20 1900)  . acetaminophen 1,000 mg (06/19/20 1222)  . ceFEPime (MAXIPIME) IV 2 g (06/19/20 1134)  . metronidazole 500 mg (06/19/20 1130)  . norepinephrine (LEVOPHED) Adult infusion 3 mcg/min (06/19/20 1244)  . pantoprozole (PROTONIX) infusion 8 mg/hr (06/19/20 0600)  . [START ON 06/20/2020] vancomycin     BJY:NWGNFA chloride, acetaminophen, alum & mag hydroxide-simeth, fentaNYL (SUBLIMAZE) injection, HYDROmorphone (DILAUDID) injection, nitroGLYCERIN, ondansetron (ZOFRAN) IV, sodium chloride flush  Allergies  Allergen Reactions  . Prozac [Fluoxetine Hcl] Other (See Comments)    Makes her fell crazy  . Zoloft [Sertraline Hcl]     Made her feel crazy--10/2013  . Nicoderm [Nicotine] Rash    Site rash     ROS:  Review of systems not obtained due to patient factors.  Blood pressure (!) 89/55, pulse (!) 108, temperature 98.5 F (36.9 C), temperature source Oral, resp. rate 13, height 5\' 4"  (1.626 m), weight 63.5 kg, SpO2 97 %. Physical Exam Vitals reviewed.  Constitutional:      General: She is not in acute distress. HENT:     Head: Normocephalic.  Eyes:     Comments: Eyes closed  Cardiovascular:     Rate and Rhythm: Tachycardia present.  Pulmonary:     Effort: Pulmonary effort is normal.  Abdominal:     Palpations: Abdomen is soft.     Comments: No obvious tenderness, ventral hernia  Musculoskeletal:     Comments: Moves all extremities  Skin:    General: Skin is warm.  Neurological:     Mental Status: She is disoriented.     Comments: Agitated and unable to answer  questions     Results: Results for orders placed or performed during the hospital encounter of 06/12/20 (from the past 48 hour(s))  Glucose, capillary     Status: Abnormal   Collection Time: 06/18/20  4:53 AM  Result Value Ref Range   Glucose-Capillary 158 (H) 70 - 99 mg/dL    Comment: Glucose reference range applies only to samples taken after fasting for at least 8 hours.  Comprehensive metabolic panel     Status: Abnormal   Collection Time: 06/18/20  5:05 AM  Result Value Ref Range   Sodium 131 (L) 135 - 145 mmol/L  Potassium 5.2 (H) 3.5 - 5.1 mmol/L   Chloride 87 (L) 98 - 111 mmol/L   CO2 30 22 - 32 mmol/L   Glucose, Bld 182 (H) 70 - 99 mg/dL    Comment: Glucose reference range applies only to samples taken after fasting for at least 8 hours.   BUN 27 (H) 8 - 23 mg/dL   Creatinine, Ser 7.82 (H) 0.44 - 1.00 mg/dL   Calcium 8.7 (L) 8.9 - 10.3 mg/dL   Total Protein 5.3 (L) 6.5 - 8.1 g/dL   Albumin 2.7 (L) 3.5 - 5.0 g/dL   AST 23 15 - 41 U/L   ALT 15 0 - 44 U/L   Alkaline Phosphatase 157 (H) 38 - 126 U/L   Total Bilirubin 1.3 (H) 0.3 - 1.2 mg/dL   GFR, Estimated 30 (L) >60 mL/min    Comment: (NOTE) Calculated using the CKD-EPI Creatinine Equation (2021)    Anion gap 14 5 - 15    Comment: Performed at [facility], [address] [place], [place] [address]  CBC with Differential/Platelet     Status: Abnormal   Collection Time: 06/18/20  5:05 AM  Result Value Ref Range   WBC 19.5 (H) 4.0 - 10.5 K/uL   RBC 3.87 3.87 - 5.11 MIL/uL   Hemoglobin 10.2 (L) 12.0 - 15.0 g/dL   HCT 30.8 (L) 65.7 - 84.6 %   MCV 89.1 80.0 - 100.0 fL   MCH 26.4 26.0 - 34.0 pg   MCHC 29.6 (L) 30.0 - 36.0 g/dL   RDW 96.2 (H) 95.2 - 84.1 %   Platelets 299 150 - 400 K/uL   nRBC 0.0 0.0 - 0.2 %   Neutrophils Relative % 89 %   Neutro Abs 17.3 (H) 1.7 - 7.7 K/uL   Lymphocytes Relative 2 %   Lymphs Abs 0.4 (L) 0.7 - 4.0 K/uL   Monocytes Relative 8 %   Monocytes Absolute 1.6 (H) 0.1 - 1.0 K/uL    Eosinophils Relative 0 %   Eosinophils Absolute 0.0 0.0 - 0.5 K/uL   Basophils Relative 0 %   Basophils Absolute 0.1 0.0 - 0.1 K/uL   Immature Granulocytes 1 %   Abs Immature Granulocytes 0.09 (H) 0.00 - 0.07 K/uL    Comment: Performed at [facility], [address], [place], [place] [address]  Glucose, capillary     Status: Abnormal   Collection Time: 06/18/20  8:29 AM  Result Value Ref Range   Glucose-Capillary 180 (H) 70 - 99 mg/dL    Comment: Glucose reference range applies only to samples taken after fasting for at least 8 hours.  Blood gas, arterial     Status: Abnormal   Collection Time: 06/18/20 10:31 AM  Result Value Ref Range   FIO2 36.00    pH, Arterial 7.461 (H) 7.350 - 7.450   pCO2 arterial 48.4 (H) 32.0 - 48.0 mmHg   pO2, Arterial 56.8 (L) 83.0 - 108.0 mmHg   Bicarbonate 33.0 (H) 20.0 - 28.0 mmol/L   Acid-Base Excess 9.8 (H) 0.0 - 2.0 mmol/L   O2 Saturation 87.7 %   Patient temperature 37.0    Allens test (pass/fail) PASS PASS    Comment: Performed at [facility], [address], [place], [place] [address]  Glucose, capillary     Status: Abnormal   Collection Time: 06/18/20 10:35 AM  Result Value Ref Range   Glucose-Capillary 176 (H) 70 - 99 mg/dL    Comment: Glucose reference range applies only to samples  taken after fasting for at least 8 hours.  CBC     Status: Abnormal   Collection Time: 06/18/20 11:18 AM  Result Value Ref Range   WBC 18.8 (H) 4.0 - 10.5 K/uL   RBC 3.73 (L) 3.87 - 5.11 MIL/uL   Hemoglobin 9.8 (L) 12.0 - 15.0 g/dL   HCT 16.1 (L) 09.6 - 04.5 %   MCV 87.1 80.0 - 100.0 fL   MCH 26.3 26.0 - 34.0 pg   MCHC 30.2 30.0 - 36.0 g/dL   RDW 40.9 (H) 81.1 - 91.4 %   Platelets 284 150 - 400 K/uL   nRBC 0.0 0.0 - 0.2 %    Comment: Performed at [facility], [address], [place], [place] [address]  Culture, blood (Routine X 2) w Reflex to ID Panel     Status: None (Preliminary result)   Collection Time: 06/18/20 11:18 AM   Specimen: BLOOD RIGHT HAND   Result Value Ref Range   Specimen Description BLOOD RIGHT HAND    Special Requests      Blood Culture results may not be optimal due to an inadequate volume of blood received in culture bottles BOTTLES DRAWN AEROBIC AND ANAEROBIC   Culture      NO GROWTH < 24 HOURS Performed at [facility], [address], [place], [place] [address]    Report Status PENDING   Culture, blood (Routine X 2) w Reflex to ID Panel     Status: None (Preliminary result)   Collection Time: 06/18/20 11:18 AM   Specimen: BLOOD RIGHT WRIST  Result Value Ref Range   Specimen Description BLOOD RIGHT WRIST    Special Requests      Blood Culture results may not be optimal due to an inadequate volume of blood received in culture bottles BOTTLES DRAWN AEROBIC AND ANAEROBIC   Culture      NO GROWTH < 24 HOURS Performed at [facility], [address], [place], [place] [address]    Report Status PENDING   Procalcitonin - Baseline     Status: None   Collection Time: 06/18/20 11:18 AM  Result Value Ref Range   Procalcitonin 5.85 ng/mL    Comment:        Interpretation: PCT > 2 ng/mL: Systemic infection (sepsis) is likely, unless other causes are known. (NOTE)       Sepsis PCT Algorithm           Lower Respiratory Tract                                      Infection PCT Algorithm    ----------------------------     ----------------------------         PCT < 0.25 ng/mL                PCT < 0.10 ng/mL          Strongly encourage             Strongly discourage   discontinuation of antibiotics    initiation of antibiotics    ----------------------------     -----------------------------       PCT 0.25 - 0.50 ng/mL            PCT 0.10 - 0.25 ng/mL               OR       >80% decrease in PCT  Discourage initiation of                                            antibiotics      Encourage discontinuation           of antibiotics    ----------------------------     -----------------------------         PCT  >= 0.50 ng/mL              PCT 0.26 - 0.50 ng/mL               AND       <80% decrease in PCT              Encourage initiation of                                             antibiotics       Encourage continuation           of antibiotics    ----------------------------     -----------------------------        PCT >= 0.50 ng/mL                  PCT > 0.50 ng/mL               AND         increase in PCT                  Strongly encourage                                      initiation of antibiotics    Strongly encourage escalation           of antibiotics                                     -----------------------------                                           PCT <= 0.25 ng/mL                                                 OR                                        > 80% decrease in PCT                                      Discontinue / Do not initiate  antibiotics  Performed at [facility], [address] [place], [place] [address]   Type and screen [facility]     Status: None   Collection Time: 06/18/20 11:18 AM  Result Value Ref Range   ABO/RH(D) O POS    Antibody Screen NEG    Sample Expiration      06/21/2020,2359 Performed at [facility], [address], [place], [place] [address]   Ammonia     Status: None   Collection Time: 06/18/20 11:18 AM  Result Value Ref Range   Ammonia 23 9 - 35 umol/L    Comment: Performed at [facility], [address] [place], [place] [address]  Vitamin B12     Status: None   Collection Time: 06/18/20 11:18 AM  Result Value Ref Range   Vitamin B-12 205 180 - 914 pg/mL    Comment: (NOTE) This assay is not validated for testing neonatal or myeloproliferative syndrome specimens for Vitamin B12 levels. Performed at [facility], [address], [place], [place] [address]   TSH     Status: None   Collection Time: 06/18/20 11:18 AM  Result Value Ref Range   TSH 1.011 0.350 - 4.500  uIU/mL    Comment: Performed by a 3rd Generation assay with a functional sensitivity of <=0.01 uIU/mL. Performed at [facility], [address] [place], [place] [address]   T4, free     Status: None   Collection Time: 06/18/20 11:18 AM  Result Value Ref Range   Free T4 1.08 0.61 - 1.12 ng/dL    Comment: (NOTE) Biotin ingestion may interfere with free T4 tests. If the results are inconsistent with the TSH level, previous test results, or the clinical presentation, then consider biotin interference. If needed, order repeat testing after stopping biotin. Performed at [facility], [address], [place], [place] [address]   Folate     Status: None   Collection Time: 06/18/20 11:18 AM  Result Value Ref Range   Folate 11.3 >5.9 ng/mL    Comment: Performed at [facility], [address], [place], [place] [address]  Urinalysis, Complete w Microscopic Urine, Catheterized     Status: Abnormal   Collection Time: 06/18/20 12:19 PM  Result Value Ref Range   Color, Urine AMBER (A) YELLOW    Comment: BIOCHEMICALS MAY BE AFFECTED BY COLOR   APPearance CLEAR CLEAR   Specific Gravity, Urine 1.010 1.005 - 1.030   pH 5.0 5.0 - 8.0   Glucose, UA NEGATIVE NEGATIVE mg/dL   Hgb urine dipstick SMALL (A) NEGATIVE   Bilirubin Urine NEGATIVE NEGATIVE   Ketones, ur NEGATIVE NEGATIVE mg/dL   Protein, ur NEGATIVE NEGATIVE mg/dL   Nitrite NEGATIVE NEGATIVE   Leukocytes,Ua NEGATIVE NEGATIVE   WBC, UA 0-5 0 - 5 WBC/hpf   Bacteria, UA NONE SEEN NONE SEEN   Squamous Epithelial / LPF 0-5 0 - 5   Mucus PRESENT     Comment: Performed at [facility], [address], [place], [place] [address]  Glucose, capillary     Status: Abnormal   Collection Time: 06/18/20  4:46 PM  Result Value Ref Range   Glucose-Capillary 118 (H) 70 - 99 mg/dL    Comment: Glucose reference range applies only to samples taken after fasting for at least 8 hours.  Glucose, capillary     Status: Abnormal   Collection Time:  06/18/20  8:16 PM  Result Value Ref Range   Glucose-Capillary 108 (H) 70 - 99 mg/dL  Comment: Glucose reference range applies only to samples taken after fasting for at least 8 hours.  Procalcitonin     Status: None   Collection Time: 06/19/20  1:45 AM  Result Value Ref Range   Procalcitonin 7.15 ng/mL    Comment:        Interpretation: PCT > 2 ng/mL: Systemic infection (sepsis) is likely, unless other causes are known. (NOTE)       Sepsis PCT Algorithm           Lower Respiratory Tract                                      Infection PCT Algorithm    ----------------------------     ----------------------------         PCT < 0.25 ng/mL                PCT < 0.10 ng/mL          Strongly encourage             Strongly discourage   discontinuation of antibiotics    initiation of antibiotics    ----------------------------     -----------------------------       PCT 0.25 - 0.50 ng/mL            PCT 0.10 - 0.25 ng/mL               OR       >80% decrease in PCT            Discourage initiation of                                            antibiotics      Encourage discontinuation           of antibiotics    ----------------------------     -----------------------------         PCT >= 0.50 ng/mL              PCT 0.26 - 0.50 ng/mL               AND       <80% decrease in PCT              Encourage initiation of                                             antibiotics       Encourage continuation           of antibiotics    ----------------------------     -----------------------------        PCT >= 0.50 ng/mL                  PCT > 0.50 ng/mL               AND         increase in PCT                  Strongly encourage  initiation of antibiotics    Strongly encourage escalation           of antibiotics                                     -----------------------------                                           PCT <= 0.25 ng/mL                                                  OR                                        > 80% decrease in PCT                                      Discontinue / Do not initiate                                             antibiotics  Performed at [facility], [address], [place], [place] [address]   Comprehensive metabolic panel     Status: Abnormal   Collection Time: 06/19/20  1:45 AM  Result Value Ref Range   Sodium 135 135 - 145 mmol/L   Potassium 4.0 3.5 - 5.1 mmol/L    Comment: DELTA CHECK NOTED   Chloride 94 (L) 98 - 111 mmol/L   CO2 31 22 - 32 mmol/L   Glucose, Bld 95 70 - 99 mg/dL    Comment: Glucose reference range applies only to samples taken after fasting for at least 8 hours.   BUN 30 (H) 8 - 23 mg/dL   Creatinine, Ser 6.04 (H) 0.44 - 1.00 mg/dL   Calcium 8.1 (L) 8.9 - 10.3 mg/dL   Total Protein 5.3 (L) 6.5 - 8.1 g/dL   Albumin 2.4 (L) 3.5 - 5.0 g/dL   AST 26 15 - 41 U/L   ALT 17 0 - 44 U/L   Alkaline Phosphatase 113 38 - 126 U/L   Total Bilirubin 1.0 0.3 - 1.2 mg/dL   GFR, Estimated 31 (L) >60 mL/min    Comment: (NOTE) Calculated using the CKD-EPI Creatinine Equation (2021)    Anion gap 10 5 - 15    Comment: Performed at [facility], [address] [place], [place] [address]  CBC with Differential/Platelet     Status: Abnormal   Collection Time: 06/19/20  1:45 AM  Result Value Ref Range   WBC 15.2 (H) 4.0 - 10.5 K/uL   RBC 3.54 (L) 3.87 - 5.11 MIL/uL   Hemoglobin 9.3 (L) 12.0 - 15.0 g/dL   HCT 11.9 (L) 14.7 - 82.9 %   MCV 87.3 80.0 - 100.0 fL   MCH 26.3 26.0 - 34.0 pg   MCHC 30.1 30.0 - 36.0 g/dL   RDW  17.7 (H) 11.5 - 15.5 %   Platelets 291 150 - 400 K/uL   nRBC 0.0 0.0 - 0.2 %   Neutrophils Relative % 87 %   Neutro Abs 13.1 (H) 1.7 - 7.7 K/uL   Lymphocytes Relative 5 %   Lymphs Abs 0.8 0.7 - 4.0 K/uL   Monocytes Relative 7 %   Monocytes Absolute 1.1 (H) 0.1 - 1.0 K/uL   Eosinophils Relative 0 %   Eosinophils Absolute 0.0 0.0 - 0.5 K/uL   Basophils Relative 0 %    Basophils Absolute 0.0 0.0 - 0.1 K/uL   Immature Granulocytes 1 %   Abs Immature Granulocytes 0.07 0.00 - 0.07 K/uL    Comment: Performed at [facility], [address], [place], [place] [address]  Lactic acid, plasma     Status: None   Collection Time: 06/19/20  1:45 AM  Result Value Ref Range   Lactic Acid, Venous 1.2 0.5 - 1.9 mmol/L    Comment: Performed at [facility], [address] [place], [place] [address]  Amylase     Status: None   Collection Time: 06/19/20  1:45 AM  Result Value Ref Range   Amylase 72 28 - 100 U/L    Comment: Performed at [facility], [address] [place], [place] [address]  Lipase, blood     Status: None   Collection Time: 06/19/20  1:45 AM  Result Value Ref Range   Lipase 19 11 - 51 U/L    Comment: Performed at [facility], [address], [place], [place] [address]  Glucose, capillary     Status: None   Collection Time: 06/19/20  7:47 AM  Result Value Ref Range   Glucose-Capillary 92 70 - 99 mg/dL    Comment: Glucose reference range applies only to samples taken after fasting for at least 8 hours.  Glucose, capillary     Status: None   Collection Time: 06/19/20 11:31 AM  Result Value Ref Range   Glucose-Capillary 89 70 - 99 mg/dL    Comment: Glucose reference range applies only to samples taken after fasting for at least 8 hours.    Discussed CT with Dr. Tyron Russell and personally reviewed- Esophagus has area that looks like possible traction diverticula that we can see on multiple prior images in the area that is of concern, no fluid or signs of inflammation, like air in this diverticula, rectosigmoid colitis/ procitis, distended gallbladder but no thickening or signs of cholecystitis  CT ABDOMEN PELVIS WO CONTRAST  Result Date: 06/19/2020 CLINICAL DATA:  Follow-up abnormal chest radiograph. Shortness of breath for 1 week. EXAM: CT CHEST, ABDOMEN AND PELVIS WITHOUT CONTRAST TECHNIQUE: Multidetector CT imaging of the chest, abdomen and pelvis was  performed following the standard protocol without IV contrast. COMPARISON:  CT AP 01/30/2020 and CTA chest 06/12/2020 FINDINGS: CT CHEST FINDINGS Cardiovascular: Heart size appears mildly enlarged. Aortic atherosclerosis. Coronary artery calcifications. No pericardial effusion. Mediastinum/Nodes: No mediastinal, supraclavicular or axillary adenopathy. There is an apparent small air-filled tract arising off the left lateral wall of the esophagus at the level of the transverse aortic arch chest just proximal to the left mainstem bronchus. This appears to contain a small volume of gas and debris, image 16/2. No significant pneumo mediastinum identified. This finding may reflect collapsed esophagus with lateral deviation versus esophageal perforation status post EGD. The trachea appears patent and is midline. Normal appearance of the thyroid gland. Lungs/Pleura: Small bilateral pleural effusions are identified, new from previous exam. Subsegmental atelectasis  is noted within the posteromedial right lower lobe, lingula and left lower lobe. No pneumothorax identified. Mild changes of centrilobular emphysema with diffuse bronchial wall thickening. Musculoskeletal: Multiple bilateral rib deformities are identified, likely posttraumatic. Unchanged. 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 Similar appearance of vertebra plana deformity at the T9 level. CT ABDOMEN PELVIS FINDINGS Hepatobiliary: No focal liver abnormality. Gallbladder appears diffusely distended. No signs of gallbladder wall thickening or inflammation. No bile duct dilatation Pancreas: Unremarkable. No pancreatic ductal dilatation or surrounding inflammatory changes. Spleen: Normal in size without focal abnormality. Adrenals/Urinary Tract: Normal appearance of the adrenal glands. No kidney mass or hydronephrosis. Urinary bladder is unremarkable. Stomach/Bowel: Hiatal hernia. The appendix is not confidently identified separate  from the right lower quadrant bowel loops. No small bowel wall thickening, inflammation, or distension. A rectal tube is in place. Within the limitations of unenhanced technique there is wall thickening involving the distal sigmoid colon and rectum with diffuse perirectal soft tissue stranding and edema. This appears new from 01/30/2020. Vascular/Lymphatic: Aortic atherosclerosis. No aneurysm. No abdominopelvic adenopathy identified. Reproductive: Status post hysterectomy. No adnexal masses. Other: No focal fluid collections identified. No signs of pneumoperitoneum. Midline ventral abdominal wall hernia is identified which contains fat only. Musculoskeletal: Previous L5-S1 PLIF with posterior translation of the interbody fusion cage with compression upon the spinal canal. This is unchanged from 01/30/2020. Grade 2 anterolisthesis is identified at this level, unchanged. Chronic fracture deformities involving the right superior and inferior pubic rami noted. IMPRESSION: 1. There is apparent air-filled tract arising off the left lateral wall of the esophagus at the level of the transverse aortic arch and left mainstem bronchus. Although this may reflect lateral deviation of collapsed esophagus perforation cannot be excluded. If there is a clinical concern for esophageal perforation further evaluation with water-soluble esophagram or repeat CT of the chest following ingestion of water-soluble contrast material is advised. 2. Small bilateral pleural effusions with multifocal areas of lower lung zone subsegmental atelectasis 3. Wall thickening involving the rectosigmoid colon is identified with diffuse perirectal soft tissue stranding/edema. Correlate for any clinical signs or symptoms of inflammatory or proctitis. 4. Aortic Atherosclerosis (ICD10-I70.0) and Emphysema (ICD10-J43.9). 5. Coronary artery calcifications. 6. 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 Chronic bilateral rib fractures and  chronic fractures involving the superior and inferior right pubic rami noted. Electronically Signed   By: Signa Kell M.D.   On: 06/19/2020 12:12   CT HEAD WO CONTRAST  Result Date: 06/18/2020 CLINICAL DATA:  Inpatient. Mental status change after EGD 06/17/2020. Restless. No reported injury. EXAM: CT HEAD WITHOUT CONTRAST TECHNIQUE: Contiguous axial images were obtained from the base of the skull through the vertex without intravenous contrast. COMPARISON:  None. FINDINGS: Brain: Small focus of cortical hypodensity in right occipital lobe (series 2/image 11). Nonspecific moderate subcortical and periventricular white matter hypodensity, most in keeping with chronic small vessel ischemic change. No evidence of parenchymal hemorrhage or extra-axial fluid collection. No mass lesion, mass effect, or midline shift. Cerebral volume is age appropriate. No ventriculomegaly. Vascular: No acute abnormality. Skull: No evidence of calvarial fracture. Sinuses/Orbits: The visualized paranasal sinuses are essentially clear. Other:  The mastoid air cells are unopacified. IMPRESSION: 1. Small focus of cortical hypodensity in the right occipital lobe, cannot exclude acute/subacute infarct. No acute intracranial hemorrhage. MRI brain without and with IV contrast could be considered for further evaluation as clinically warranted. 2. Moderate chronic small vessel ischemic changes in the cerebral white matter. These results  will be called to the ordering clinician or representative by the Radiologist Assistant, and communication documented in the PACS or [employer]. Electronically Signed   By: Delbert Phenix M.D.   On: 06/18/2020 17:53   CT CHEST WO CONTRAST  Result Date: 06/19/2020 CLINICAL DATA:  Follow-up abnormal chest radiograph. Shortness of breath for 1 week. EXAM: CT CHEST, ABDOMEN AND PELVIS WITHOUT CONTRAST TECHNIQUE: Multidetector CT imaging of the chest, abdomen and pelvis was performed following the standard  protocol without IV contrast. COMPARISON:  CT AP 01/30/2020 and CTA chest 06/12/2020 FINDINGS: CT CHEST FINDINGS Cardiovascular: Heart size appears mildly enlarged. Aortic atherosclerosis. Coronary artery calcifications. No pericardial effusion. Mediastinum/Nodes: No mediastinal, supraclavicular or axillary adenopathy. There is an apparent small air-filled tract arising off the left lateral wall of the esophagus at the level of the transverse aortic arch chest just proximal to the left mainstem bronchus. This appears to contain a small volume of gas and debris, image 16/2. No significant pneumo mediastinum identified. This finding may reflect collapsed esophagus with lateral deviation versus esophageal perforation status post EGD. The trachea appears patent and is midline. Normal appearance of the thyroid gland. Lungs/Pleura: Small bilateral pleural effusions are identified, new from previous exam. Subsegmental atelectasis is noted within the posteromedial right lower lobe, lingula and left lower lobe. No pneumothorax identified. Mild changes of centrilobular emphysema with diffuse bronchial wall thickening. Musculoskeletal: Multiple bilateral rib deformities are identified, likely posttraumatic. Unchanged. 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 Similar appearance of vertebra plana deformity at the T9 level. CT ABDOMEN PELVIS FINDINGS Hepatobiliary: No focal liver abnormality. Gallbladder appears diffusely distended. No signs of gallbladder wall thickening or inflammation. No bile duct dilatation Pancreas: Unremarkable. No pancreatic ductal dilatation or surrounding inflammatory changes. Spleen: Normal in size without focal abnormality. Adrenals/Urinary Tract: Normal appearance of the adrenal glands. No kidney mass or hydronephrosis. Urinary bladder is unremarkable. Stomach/Bowel: Hiatal hernia. The appendix is not confidently identified separate from the right lower quadrant  bowel loops. No small bowel wall thickening, inflammation, or distension. A rectal tube is in place. Within the limitations of unenhanced technique there is wall thickening involving the distal sigmoid colon and rectum with diffuse perirectal soft tissue stranding and edema. This appears new from 01/30/2020. Vascular/Lymphatic: Aortic atherosclerosis. No aneurysm. No abdominopelvic adenopathy identified. Reproductive: Status post hysterectomy. No adnexal masses. Other: No focal fluid collections identified. No signs of pneumoperitoneum. Midline ventral abdominal wall hernia is identified which contains fat only. Musculoskeletal: Previous L5-S1 PLIF with posterior translation of the interbody fusion cage with compression upon the spinal canal. This is unchanged from 01/30/2020. Grade 2 anterolisthesis is identified at this level, unchanged. Chronic fracture deformities involving the right superior and inferior pubic rami noted. IMPRESSION: 1. There is apparent air-filled tract arising off the left lateral wall of the esophagus at the level of the transverse aortic arch and left mainstem bronchus. Although this may reflect lateral deviation of collapsed esophagus perforation cannot be excluded. If there is a clinical concern for esophageal perforation further evaluation with water-soluble esophagram or repeat CT of the chest following ingestion of water-soluble contrast material is advised. 2. Small bilateral pleural effusions with multifocal areas of lower lung zone subsegmental atelectasis 3. Wall thickening involving the rectosigmoid colon is identified with diffuse perirectal soft tissue stranding/edema. Correlate for any clinical signs or symptoms of inflammatory or proctitis. 4. Aortic Atherosclerosis (ICD10-I70.0) and Emphysema (ICD10-J43.9). 5. Coronary artery calcifications. 6. 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  Chronic bilateral rib fractures and chronic fractures involving the  superior and inferior right pubic rami noted. Electronically Signed   By: Signa Kellaylor  Stroud M.D.   On: 06/19/2020 12:12   MR BRAIN WO CONTRAST  Result Date: 06/19/2020 CLINICAL DATA:  Mental status change. EXAM: MRI HEAD WITHOUT CONTRAST TECHNIQUE: Multiplanar, multiecho pulse sequences of the brain and surrounding structures were obtained without intravenous contrast. COMPARISON:  Head CT June 18, 2020 FINDINGS: The study is partially degraded by motion. Brain: No acute infarction, hemorrhage, hydrocephalus, extra-axial collection or mass lesion. Scattered and confluent foci of T2 hyperintensity are seen within the white matter of the cerebral hemispheres and within the pons, nonspecific, most likely related to chronic small vessel ischemia. 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 Hypodense area described on prior CT corresponds to prominent arachnoid granulation in the right transverse sinus. Prominent vascular susceptibility artifact on the GRE sequence is related to recent administration of ferumoxytol seizure. Vascular: Normal flow voids. Skull and upper cervical spine: Normal marrow signal. Sinuses/Orbits: Negative. Other: None. IMPRESSION: 1. No acute intracranial abnormality. 2. 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 3. Moderate chronic small vessel ischemia. Electronically Signed   By: Baldemar LenisKatyucia  De Macedo Rodrigues M.D.   On: 06/19/2020 09:52   DG Chest Port 1 View  Result Date: 06/19/2020 CLINICAL DATA:  Central line placement. EXAM: PORTABLE CHEST 1 VIEW COMPARISON:  April 09/30/2020. FINDINGS: Low lung volumes with bibasilar opacities. No visible pleural effusions or pneumothorax on this portable semi rib upright radiograph. Mild enlargement the cardiac silhouette. Calcific atherosclerosis of the aorta. Right IJ central venous catheter with the tip projecting at the superior cavoatrial junction. Polyarticular degenerative change.  IMPRESSION: 1. Right IJ central venous catheter with the tip projecting at the superior cavoatrial junction. 2. Low lung volumes with bibasilar opacities that are better characterized on same day CT chest. Electronically Signed   By: Feliberto HartsFrederick S Jones MD   On: 06/19/2020 11:15   DG CHEST PORT 1 VIEW  Result Date: 06/18/2020 CLINICAL DATA:  Shortness of breath, hypoxia, history hypertension, smoker EXAM: PORTABLE CHEST 1 VIEW COMPARISON:  Portable exam 1043 hours compared to 06/15/2020 FINDINGS: Upper normal heart size. Mediastinal contours and pulmonary vascularity normal. Atherosclerotic calcification aorta. Bronchitic changes with bibasilar atelectasis. Remaining lungs clear. No pleural effusion or pneumothorax. Bones demineralized with advanced RIGHT glenohumeral degenerative changes. IMPRESSION: Persistent bronchitic changes and bibasilar atelectasis. Aortic Atherosclerosis (ICD10-I70.0). Electronically Signed   By: Ulyses SouthwardMark  Boles M.D.   On: 06/18/2020 11:13   DG Abd 2 Views  Result Date: 06/19/2020 CLINICAL DATA:  Abdominal pain and altered mental status. EXAM: ABDOMEN - 2 VIEW COMPARISON:  CT abdomen and pelvis 01/30/2020 FINDINGS: Scattered gas and stool in the colon. No definite small or large bowel dilatation although paucity of gas limits evaluation. No free intra-abdominal air. No abnormal air-fluid levels. No radiopaque stones. There is evidence of infiltration or atelectasis in both lung bases. Postoperative changes in the lumbosacral spine. Degenerative changes in the lumbar spine and hips. IMPRESSION: 1. Nonobstructive bowel gas pattern. 2. Infiltration or atelectasis in both lung bases. Electronically Signed   By: Burman NievesWilliam  Stevens M.D.   On: 06/19/2020 02:10     Assessment & Plan:  Brenda Hopkins is a 80 y.o. female with septic shock from potentially some colitis but no obvious inflammation or infection in gallbladder, esophageal area looks like diverticula consistent on prior imaging.   No acute intervention or surgery  indicated. NPO currently and would not feed until can take in oral contrast and do CT chest to r/o esophageal perforation although highly unlikely.   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 Tyron Russell recommended oral contrast with CT chest)    Discussed with Dr. Arbutus Leas, Dr. Marletta Lor, RN, and Dr. Tyron Russell.    Lucretia Roers 06/19/2020, 1:48 PM

## 2020-06-19 NOTE — Progress Notes (Signed)
0100 eLink critical care physician consulted.

## 2020-06-19 NOTE — Progress Notes (Incomplete)
Palliative:  HPI: 80 y.o. female  with past medical history of CHF, s/p [employer] PPM/ICD, CAD, IDDM, CKD stage 3b, prostatomegaly admitted on 08/11/2021 with weakness x 3 days after fall followed with pain, difficulty to ambulate, decrease intake and urine output. Noted to have hypotension, leukocytosis related to discitis aspiration showing +Klebsiella along with UTI. Hospitalization complicated by cardiogenic shock EF <20%, grade 2 diastolic dysfunction, moderate pulmonary hypertension, renal failure, bladder outlet obstruction, paroxysmal atrial fibrillation. Ongoing significant fluid overload and poor response to diuresis. Trial large dose of Lasix while on dobutamine.   I met again today with Brenda Hopkins along with his daughter and wife at bedside. Brenda Hopkins continues to speak to his poor prognosis and is able to tell his family that he is very much at peace. He reviews that his vital signs have been good but he understands that his heart is "give out" - he says that his heart has served him well for many good years. He is a very spiritual man and reports that he had wonderful visits with chaplain Ellen. He is at peace and is prepared for transition to comfort care when his other daughter returns from [place]. Family at bedside report that they believed she would be back Thurs/Fri but she will not return until Monday July 3rd. Brenda Hopkins was disheartened that she does not return until Monday but they all continue to report their goal to continue to maintain until she returns and then we will transition to full comfort care and allow natural and peaceful death.   I spoke with them again about my recommendation for deactivation of AICD and they all agree that this would be in his best interest at this time. I explained the process and will place order for [employer] to come to deactivate.   All questions/concerns addressed. Emotional support provided.   Exam: Alert, oriented. Frail.  Lying in bed. No distress. Generalized weakness and fatigue. HR 90-100s. Breathing regular, unlabored at rest. Abd soft. BLE edema.   Plan: - DNR - Deactivate AICD - Awaiting daughter's return from vacation to say goodbye and then will transition to full comfort care (she should be back Monday)  40 min  Alicia Parker, NP Palliative Medicine Team Pager [phone] (Please see [email or web address] for schedule) Team Phone [phone]    Greater than 50%  of this time was spent counseling and coordinating care related to the above assessment and plan   

## 2020-06-19 NOTE — Progress Notes (Signed)
2315 No relief noted from previous administration of Fentanyl or Haldol. Notified hospitalist Frankey Shown, DO of concern for possible withdrawal due to patient taking oxycodone 15 mg QID PTA. Patient HR sustaining 120-140s. Unable to obtain accurate BP reading due to continuous patient movement. Speech now clear. Patient screaming "help me," moaning, attempting to climb OOB, pulling at lines/tubing with BSW restraints/mittens. Multiple RNs attempting to reorient, reposition patient throughout shift. Relaxation videos utilized in patient room.   Telephone conversation with hospitalist with above concerns discussed again. Provider explained "I have four admissions to attend to that I have not yet seen. I'm extremely busy and I can't get those done because you keep contacting me regarding this patient." Provider commented that "nursing is not exhausting all efforts to identify underlying cause of patient behavior before contacting provider, which should be addressed first in attempt not to exhaust future interventions. Patient is not displaying signs of withdrawal. Patient has dementia, is sun-downing."    EKG obtained per provider order. Provider does not want to give additional dose of Haldol at this time. Provider does not want to start a precedex gtt at this time. Discussed giving additional dose of Fentanyl for suspected pain.  0020 Fentanyl given PRN IV push per provider order, see eMAR.

## 2020-06-20 ENCOUNTER — Inpatient Hospital Stay (HOSPITAL_COMMUNITY): Payer: Medicare Other

## 2020-06-20 DIAGNOSIS — I5033 Acute on chronic diastolic (congestive) heart failure: Secondary | ICD-10-CM | POA: Diagnosis not present

## 2020-06-20 DIAGNOSIS — N179 Acute kidney failure, unspecified: Secondary | ICD-10-CM | POA: Diagnosis not present

## 2020-06-20 DIAGNOSIS — K6289 Other specified diseases of anus and rectum: Secondary | ICD-10-CM

## 2020-06-20 DIAGNOSIS — G9341 Metabolic encephalopathy: Secondary | ICD-10-CM | POA: Diagnosis not present

## 2020-06-20 DIAGNOSIS — J9601 Acute respiratory failure with hypoxia: Secondary | ICD-10-CM | POA: Diagnosis not present

## 2020-06-20 LAB — CBC WITH DIFFERENTIAL/PLATELET
Abs Immature Granulocytes: 0.07 10*3/uL (ref 0.00–0.07)
Basophils Absolute: 0 10*3/uL (ref 0.0–0.1)
Basophils Relative: 0 %
Eosinophils Absolute: 0.2 10*3/uL (ref 0.0–0.5)
Eosinophils Relative: 2 %
HCT: 29 % — ABNORMAL LOW (ref 36.0–46.0)
Hemoglobin: 8.3 g/dL — ABNORMAL LOW (ref 12.0–15.0)
Immature Granulocytes: 1 %
Lymphocytes Relative: 6 %
Lymphs Abs: 0.6 10*3/uL — ABNORMAL LOW (ref 0.7–4.0)
MCH: 26.1 pg (ref 26.0–34.0)
MCHC: 28.6 g/dL — ABNORMAL LOW (ref 30.0–36.0)
MCV: 91.2 fL (ref 80.0–100.0)
Monocytes Absolute: 0.7 10*3/uL (ref 0.1–1.0)
Monocytes Relative: 7 %
Neutro Abs: 8.5 10*3/uL — ABNORMAL HIGH (ref 1.7–7.7)
Neutrophils Relative %: 84 %
Platelets: 242 10*3/uL (ref 150–400)
RBC: 3.18 MIL/uL — ABNORMAL LOW (ref 3.87–5.11)
RDW: 17.3 % — ABNORMAL HIGH (ref 11.5–15.5)
WBC: 10 10*3/uL (ref 4.0–10.5)
nRBC: 0 % (ref 0.0–0.2)

## 2020-06-20 LAB — MAGNESIUM: Magnesium: 1.8 mg/dL (ref 1.7–2.4)

## 2020-06-20 LAB — URINE CULTURE: Culture: <10000 — AB

## 2020-06-20 LAB — GLUCOSE, CAPILLARY
Glucose-Capillary: 141 mg/dL — ABNORMAL HIGH (ref 70–99)
Glucose-Capillary: 69 mg/dL — ABNORMAL LOW (ref 70–99)
Glucose-Capillary: 71 mg/dL (ref 70–99)
Glucose-Capillary: 85 mg/dL (ref 70–99)

## 2020-06-20 LAB — COMPREHENSIVE METABOLIC PANEL
ALT: 14 U/L (ref 0–44)
AST: 17 U/L (ref 15–41)
Albumin: 2.1 g/dL — ABNORMAL LOW (ref 3.5–5.0)
Alkaline Phosphatase: 95 U/L (ref 38–126)
Anion gap: 7 (ref 5–15)
BUN: 18 mg/dL (ref 8–23)
CO2: 32 mmol/L (ref 22–32)
Calcium: 8 mg/dL — ABNORMAL LOW (ref 8.9–10.3)
Chloride: 102 mmol/L (ref 98–111)
Creatinine, Ser: 0.81 mg/dL (ref 0.44–1.00)
GFR, Estimated: >60 mL/min (ref >60)
Glucose, Bld: 75 mg/dL (ref 70–99)
Potassium: 3.4 mmol/L — ABNORMAL LOW (ref 3.5–5.1)
Sodium: 141 mmol/L (ref 135–145)
Total Bilirubin: 0.8 mg/dL (ref 0.3–1.2)
Total Protein: 4.7 g/dL — ABNORMAL LOW (ref 6.5–8.1)

## 2020-06-20 LAB — PROCALCITONIN: Procalcitonin: 2.43 ng/mL

## 2020-06-20 MED ORDER — LORAZEPAM 2 MG/ML IJ SOLN
0.5000 mg | Freq: Four times a day (QID) | INTRAMUSCULAR | Status: DC | PRN
  Administered 2020-06-20 – 2020-06-22 (×6): 0.5 mg via INTRAVENOUS
  Filled 2020-06-20 (×6): qty 1

## 2020-06-20 MED ORDER — DEXTROSE 50 % IV SOLN
INTRAVENOUS | Status: AC
  Filled 2020-06-20: qty 50

## 2020-06-20 MED ORDER — IOHEXOL 9 MG/ML PO SOLN
ORAL | Status: AC
  Filled 2020-06-20: qty 500

## 2020-06-20 MED ORDER — IOHEXOL 300 MG/ML  SOLN
75.0000 mL | Freq: Once | INTRAMUSCULAR | Status: AC | PRN
  Administered 2020-06-20: 75 mL via INTRAVENOUS

## 2020-06-20 MED ORDER — LORAZEPAM 2 MG/ML IJ SOLN: 0.5000 mg | Freq: Once | INTRAMUSCULAR | Status: DC

## 2020-06-20 MED ORDER — METOPROLOL TARTRATE 5 MG/5ML IV SOLN
2.5000 mg | Freq: Three times a day (TID) | INTRAVENOUS | Status: DC
  Administered 2020-06-20 – 2020-06-21 (×2): 2.5 mg via INTRAVENOUS
  Filled 2020-06-20 (×2): qty 5

## 2020-06-20 NOTE — Progress Notes (Signed)
No sign perforation on CT chest with oral. Looks like diverticula as expected.  Diet as tolerated  Algis Greenhouse, MD

## 2020-06-20 NOTE — Plan of Care (Signed)
  Problem: Education: Goal: Knowledge of General Education information will improve Description: Including pain rating scale, medication(s)/side effects and non-pharmacologic comfort measures Outcome: Progressing   Problem: Clinical Measurements: Goal: Ability to maintain clinical measurements within normal limits will improve Outcome: Progressing Goal: Will remain free from infection Outcome: Progressing Goal: Diagnostic test results will improve Outcome: Progressing   Problem: Safety: Goal: Non-violent Restraint(s) 06/20/2020 0731 by Corrie Mckusick, RN Outcome: Progressing 06/20/2020 0730 by Corrie Mckusick, RN Outcome: Progressing

## 2020-06-20 NOTE — Progress Notes (Signed)
PROGRESS NOTE  Brenda Hopkins AVW:[id number] DOB: May 19, 1940 DOA: 06/12/2020 PCP: Shawnie Dapper, PA-C Brief History: 80 y.o.female,with medical history significant forhypertension, anxiety, marginalulcer, tobacco abuseandchronic paindue to complaints of shortness of breathof one week duration. Shereports dyspneawith exertion, and laying flat, as well she does report some epigastric/lower chest pain, does not radiate, as well she does report worsening lower extremity edema.she was recently seen by her PCP for lower extremity edema which she was started on Lasix for last 4 days, report was told to have A. fib, and referral has been made to cardiologist but she did not get to see themprior to admission. She has a significant tobacco hx of >50 pack years. In the ED,BNP of 1270, chest x-ray significant for vascular congestion, EKG nonacute, troponins non-ACS pattern 71>63,was given IV Lasix, and [facility] hospitalist consulted to admit for CHF On 06/18/20, the patient was moved to ICU due to concerns of hematochezia and low BP.  She became more encephalopathic which was a change from her prior mental status.  Work up for AMS was undertaken.  It was felt this was primarily driven by new septic like clinical picture.  The patient was started on empiric vanc and cefepime initially.  She remained hypotensive despite fluid resuscitation and levophed was started.  GOC was discussed with patient's daughter and patient was changed to DNR.  Palliative medicine was consulted.  Fortunately, the patient stabilized with IV fluids, IV abx and vasopressor support.  Her levophed was weaned off 06/19/20.  Assessment/Plan: Acute metabolic Encephalopathy -06/18/20 patient is confused which is a change from last 24 hours -06/18/20 ABG  7.461/48/56/33 (0.36) -due to sepsis, AKI -EKG-sinus, nonspecific ST changes -CXR-bibasilar atelectasis; increased interstital/bronchitic markings -ammonia--23 -B12  205 -check PCT5.85>>7.15 -d/c gabapentin, seroquel. Requip, oxycodoone -CT brain--Small focus of cortical hypodensity in the right occipital lobe -MR brain--no acute findings  Acute on chronic diastolic CHF -BNP 1270 >>>589, 282, 198 -NEG 11L since admission prior to transfer to ICU 4/7 -Continue IV Lasix40 mg daily>>po lasix 06/17/20--now on hold due to hypotension/sepsis -Monitoring I's and O's, daily weight -2D echo--ejection fraction 55-60%,G1DD, no WMA, mild elevated PASP -appreciateconsulting cardiology -CTAchest--negpulmonary embolis;cardiomegaly, severe coronary atherosclerosis, aortic atherosclerosis,.. Bilateral sublingual atelectasis, T9 vertebral compression deformity -set up CVP  Acute respiratory failure with hypoxia  -due to CHF in the setting of underlying COPD -increased to 4L am 06/18/20>>3L -CXR--personally reviewed--increase RLL atelectasis, ?LLL opacity -check ABG  7.461/48/56/33 (0.36) -wean for saturation >90% -continue bronchodilators  Hematochezia/Colitis -GI notified, following -developed evening 06/17/20 -06/17/20 EGD--s/p Roux-en-Y with ulcer(decreased in size)and stenosis at GJ anastomosis -Hgb 10.2 in am 06/18/20>>9.3 -serial H/H -continue protonix drip -4/8 CT abd--Wall thickening involving the rectosigmoid colon is identified with diffuse perirectal soft tissue stranding/edema -continue cefepime and metronidazole -C diff assay-->colonization -follow stool pathogen panel  ??Esophageal tear -4/8 CT chest--air-filled tract arising off the left lateral wall of the esophagus at the level of the transverse aortic arch and left mainstem bronchus. Although this may reflect lateral deviation of collapsed esophagus perforation cannot be excluded -remain NPO -barium esophagram will need to be done   Sepsis -check PCT 5.85>>7.15 -lactate 1.2 -suspect intraabdominal source  -4/7 blood culture--neg to date -4/7 UA--no pyuria -continue empiric  vanc and cefepime -MRSA screen -2 view AXR--no free air; nonobstructive -CT chest/abd--as above -start levophed>>weaned off 4/8  AKI -baseline creatinine 0.6-0.9 -peaking at 1.74>>1.68>>0.81 -due to hypotension, volume depletion -improved  Questionable Atrial Fibrillation -No definitive  atrial fibrillation noted on telemetry. -appreciate cardiology input -One strip labeled as 10 beats NSVT overnight but appears more consistent with narrow-complex tachycardia. K+was5.7 on 4/5/22and Kayexalate has been administered. Mg stable at 2.1. -personally reviewed EKG--sinus, PACs, nonspecific T wave changes  Coronary Calcification on CT -has intermittent CP, reproducible, partly due to GI etiology also -troponins flat -outpt stress per cardiology -no anginal symptoms  Epigastricandchest pain -06/17/20 EGD--s/p Roux-en-Y with ulcer and stenosis at GJ anastomosis -Post prandial with every meal developed gastric pain then followed with nausea vomiting -ContinueProtonix, Carafate -Consulting gastroenterology >>>appreciate input -Ultrasound right upper quadrant negative for cholelithiasis or cholecystitis  Iron deficiency Anemia -iron saturation 7 -ferritin 12 -feraheme x 1on 06/17/20  Hyperlipidemia -continue statin  HTN -holding amlodipine  Chronic pain syndrome -continue gabapentin -continue home dose oxycodone -PMDP reviewed  Hyperkalemia -improved with kayexalate      Status is: Inpatient  Remains inpatient appropriate because:IV treatments appropriate due to intensity of illness or inability to take PO   Dispo: The patient is from:Home Anticipated d/c is ZO:XWRU Patient currently is not medically stable to d/c. Difficult to place patient No    The patient is critically ill with multiple organ systems failure and requires high complexity decision making for assessment and support, frequent  evaluation and titration of therapies, application of advanced monitoring technologies and extensive interpretation of multiple databases.  Critical care time - 35 mins.      Family Communication:daughter updated 06/19/20  Consultants:cardiology  Code Status: DNR  DVT Prophylaxis: SCDs   Procedures: As Listed in Progress Note Above  Antibiotics: Cefepime 4/7>> vanc 4/7>> Metronidazole 4/8>>     Subjective: Patient remains pleasantly confused.  Intermittently combative.  No f/c, cp, sob, n/v/  Objective: Vitals:   06/20/20 0200 06/20/20 0300 06/20/20 0400 06/20/20 0500  BP: (!) 122/58 (!) 106/56 (!) 152/71 (!) 138/56  Pulse: 74 (!) 54 68 84  Resp: 12 (!) 9 15 11   Temp:      TempSrc:      SpO2: 100% 99% 97% 97%  Weight:    64.3 kg  Height:        Intake/Output Summary (Last 24 hours) at 06/20/2020 0759 Last data filed at 06/20/2020 0500 Gross per 24 hour  Intake 942.92 ml  Output 2100 ml  Net -1157.08 ml   Weight change: 0.8 kg Exam:   General:  Pt is alert, follows commands appropriately, not in acute distress  HEENT: No icterus, No thrush, No neck mass, [place]/AT  Cardiovascular: RRR, S1/S2, no rubs, no gallops  Respiratory: bibasilar rales. No wheeze  Abdomen: Soft/+BS, non tender, non distended, no guarding  Extremities: No edema, No lymphangitis, No petechiae, No rashes, no synovitis   Data Reviewed: I have personally reviewed following labs and imaging studies Basic Metabolic Panel: Recent Labs  Lab 06/16/20 0454 06/16/20 1344 06/17/20 0648 06/18/20 0505 06/19/20 0145 06/20/20 0448  NA 135 141 137 131* 135 141  K 5.7* 4.8 4.6 5.2* 4.0 3.4*  CL 89* 91* 89* 87* 94* 102  CO2 36* 36* 35* 30 31 32  GLUCOSE 94 82 84 182* 95 75  BUN 19 19 19  27* 30* 18  CREATININE 1.05* 1.03* 1.16* 1.74* 1.68* 0.81  CALCIUM 8.7* 8.8* 8.7* 8.7* 8.1* 8.0*  MG 2.1  --   --   --   --  1.8   Liver Function Tests: Recent Labs  Lab 06/15/20 1204  06/18/20 0505 06/19/20 0145 06/20/20 0448  AST 16 23 26  17  ALT ALKPHOS 99 157* 113 95  BILITOT 0.6 1.3* 1.0 0.8  PROT 6.4* 5.3* 5.3* 4.7*  ALBUMIN 3.0* 2.7* 2.4* 2.1*   Recent Labs  Lab 06/19/20 0145  LIPASE 19  AMYLASE 72   Recent Labs  Lab 06/18/20 1118  AMMONIA 23   Coagulation Profile: No results for input(s): INR, PROTIME in the last 168 hours. CBC: Recent Labs  Lab 06/15/20 1204 06/16/20 0951 06/18/20 0505 06/18/20 1118 06/19/20 0145 06/20/20 0448  WBC 4.6  --  19.5* 18.8* 15.2* 10.0  NEUTROABS 3.1  --  17.3*  --  13.1* 8.5*  HGB 10.3* 9.6* 10.2* 9.8* 9.3* 8.3*  HCT 36.6 33.1* 34.5* 32.5* 30.9* 29.0*  MCV 92.7  --  89.1 87.1 87.3 91.2  PLT 310  --  299 284 291 242   Cardiac Enzymes: No results for input(s): CKTOTAL, CKMB, CKMBINDEX, TROPONINI in the last 168 hours. BNP: Invalid input(s): POCBNP CBG: Recent Labs  Lab 06/18/20 1646 06/18/20 2016 06/19/20 0747 06/19/20 1131 06/20/20 0754  GLUCAP 118* 108* 92 89 69*   HbA1C: No results for input(s): HGBA1C in the last 72 hours. Urine analysis:    Component Value Date/Time   COLORURINE AMBER (A) 06/18/2020 1219   APPEARANCEUR CLEAR 06/18/2020 1219   LABSPEC 1.010 06/18/2020 1219   PHURINE 5.0 06/18/2020 1219   GLUCOSEU NEGATIVE 06/18/2020 1219   HGBUR SMALL (A) 06/18/2020 1219   BILIRUBINUR NEGATIVE 06/18/2020 1219   KETONESUR NEGATIVE 06/18/2020 1219   PROTEINUR NEGATIVE 06/18/2020 1219   NITRITE NEGATIVE 06/18/2020 1219   LEUKOCYTESUR NEGATIVE 06/18/2020 1219   Sepsis Labs: (procalcitonin:4,lacticidven:4) ) Recent Results (from the past 240 hour(s))  Blood culture (routine x 2)     Status: None   Collection Time: 06/12/20  3:05 PM   Specimen: BLOOD LEFT HAND  Result Value Ref Range Status   Specimen Description BLOOD LEFT HAND  Final   Special Requests   Final    Blood Culture adequate volume BOTTLES DRAWN AEROBIC AND ANAEROBIC   Culture   Final    NO GROWTH  5 DAYS Performed at [facility], [address], [place], [place] [address]    Report Status 06/17/2020 FINAL  Final  Blood culture (routine x 2)     Status: None   Collection Time: 06/12/20  3:05 PM   Specimen: BLOOD RIGHT HAND  Result Value Ref Range Status   Specimen Description BLOOD RIGHT HAND  Final   Special Requests   Final    Blood Culture results may not be optimal due to an inadequate volume of blood received in culture bottles BOTTLES DRAWN AEROBIC AND ANAEROBIC   Culture   Final    NO GROWTH 5 DAYS Performed at [facility], [address], [place], [place] [address]    Report Status 06/17/2020 FINAL  Final  Resp Panel by RT-PCR (Flu A&B, Covid) Nasopharyngeal Swab     Status: None   Collection Time: 06/12/20  3:51 PM   Specimen: Nasopharyngeal Swab; Nasopharyngeal(NP) swabs in vial transport medium  Result Value Ref Range Status   SARS Coronavirus 2 by RT PCR NEGATIVE NEGATIVE Final    Comment: (NOTE) SARS-CoV-2 target nucleic acids are NOT DETECTED.  The SARS-CoV-2 RNA is generally detectable in upper respiratory specimens during the acute phase of infection. The lowest concentration of SARS-CoV-2 viral copies this assay can detect is 138 copies/mL. A negative result does not preclude SARS-Cov-2 infection and should not be used as the  sole basis for treatment or other patient management decisions. A negative result may occur with  improper specimen collection/handling, submission of specimen other than nasopharyngeal swab, presence of viral mutation(s) within the areas targeted by this assay, and inadequate number of viral copies(<138 copies/mL). A negative result must be combined with clinical observations, patient history, and epidemiological information. The expected result is Negative.  Fact Sheet for Patients:  BloggerCourse.com  Fact Sheet for Healthcare Providers:  SeriousBroker.it  This test is no t yet  approved or cleared by the [place] FDA and  has been authorized for detection and/or diagnosis of SARS-CoV-2 by FDA under an Emergency Use Authorization (EUA). This EUA will remain  in effect (meaning this test can be used) for the duration of the COVID-19 declaration under Section 564(b)(1) of the Act, 21 U.S.C.section 360bbb-3(b)(1), unless the authorization is terminated  or revoked sooner.       Influenza A by PCR NEGATIVE NEGATIVE Final   Influenza B by PCR NEGATIVE NEGATIVE Final    Comment: (NOTE) The Xpert Xpress SARS-CoV-2/FLU/RSV plus assay is intended as an aid in the diagnosis of influenza from Nasopharyngeal swab specimens and should not be used as a sole basis for treatment. Nasal washings and aspirates are unacceptable for Xpert Xpress SARS-CoV-2/FLU/RSV testing.  Fact Sheet for Patients: BloggerCourse.com  Fact Sheet for Healthcare Providers: SeriousBroker.it  This test is not yet approved or cleared by the [place] FDA and has been authorized for detection and/or diagnosis of SARS-CoV-2 by FDA under an Emergency Use Authorization (EUA). This EUA will remain in effect (meaning this test can be used) for the duration of the COVID-19 declaration under Section 564(b)(1) of the Act, 21 U.S.C. section 360bbb-3(b)(1), unless the authorization is terminated or revoked.  Performed at [facility], [address], [place], [place] [address]   Culture, blood (Routine X 2) w Reflex to ID Panel     Status: None (Preliminary result)   Collection Time: 06/18/20 11:18 AM   Specimen: BLOOD RIGHT HAND  Result Value Ref Range Status   Specimen Description BLOOD RIGHT HAND  Final   Special Requests   Final    Blood Culture results may not be optimal due to an inadequate volume of blood received in culture bottles BOTTLES DRAWN AEROBIC AND ANAEROBIC   Culture   Final    NO GROWTH < 24 HOURS Performed at [facility], [address] [place], [place] [address]    Report Status PENDING  Incomplete  Culture, blood (Routine X 2) w Reflex to ID Panel     Status: None (Preliminary result)   Collection Time: 06/18/20 11:18 AM   Specimen: BLOOD RIGHT WRIST  Result Value Ref Range Status   Specimen Description BLOOD RIGHT WRIST  Final   Special Requests   Final    Blood Culture results may not be optimal due to an inadequate volume of blood received in culture bottles BOTTLES DRAWN AEROBIC AND ANAEROBIC   Culture   Final    NO GROWTH < 24 HOURS Performed at [facility], [address], [place], [place] [address]    Report Status PENDING  Incomplete  C Difficile Quick Screen (NO PCR Reflex)     Status: Abnormal   Collection Time: 06/19/20  5:17 PM   Specimen: STOOL  Result Value Ref Range Status   C Diff antigen POSITIVE (A) NEGATIVE Final   C Diff toxin NEGATIVE NEGATIVE Final   C Diff interpretation   Final  Results are indeterminate. Please contact the provider listed for your campus for C diff questions in AMION.    Comment: Performed at [facility], [address], [place], [place] [address]     Scheduled Meds: . atorvastatin  20 mg Oral QHS  . budesonide (PULMICORT) nebulizer solution  0.5 mg Nebulization BID  . Chlorhexidine Gluconate Cloth  6 each Topical Daily  . ipratropium-albuterol  3 mL Nebulization TID  . [START ON 06/21/2020] pantoprazole  40 mg Intravenous Q12H  . sodium chloride flush  3 mL Intravenous Q12H  . sucralfate  1 g Oral TID with meals   Continuous Infusions: . sodium chloride Stopped (06/18/20 1900)  . ceFEPime (MAXIPIME) IV Stopped (06/19/20 2305)  . metronidazole 500 mg (06/19/20 2346)  . norepinephrine (LEVOPHED) Adult infusion Stopped (06/19/20 1905)  . pantoprozole (PROTONIX) infusion 8 mg/hr (06/20/20 0139)  . vancomycin      Procedures/Studies: CT ABDOMEN PELVIS WO CONTRAST  Result Date: 06/19/2020 CLINICAL DATA:  Follow-up abnormal chest radiograph.  Shortness of breath for 1 week. EXAM: CT CHEST, ABDOMEN AND PELVIS WITHOUT CONTRAST TECHNIQUE: Multidetector CT imaging of the chest, abdomen and pelvis was performed following the standard protocol without IV contrast. COMPARISON:  CT AP 01/30/2020 and CTA chest 06/12/2020 FINDINGS: CT CHEST FINDINGS Cardiovascular: Heart size appears mildly enlarged. Aortic atherosclerosis. Coronary artery calcifications. No pericardial effusion. Mediastinum/Nodes: No mediastinal, supraclavicular or axillary adenopathy. There is an apparent small air-filled tract arising off the left lateral wall of the esophagus at the level of the transverse aortic arch chest just proximal to the left mainstem bronchus. This appears to contain a small volume of gas and debris, image 16/2. No significant pneumo mediastinum identified. This finding may reflect collapsed esophagus with lateral deviation versus esophageal perforation status post EGD. The trachea appears patent and is midline. Normal appearance of the thyroid gland. Lungs/Pleura: Small bilateral pleural effusions are identified, new from previous exam. Subsegmental atelectasis is noted within the posteromedial right lower lobe, lingula and left lower lobe. No pneumothorax identified. Mild changes of centrilobular emphysema with diffuse bronchial wall thickening. Musculoskeletal: Multiple bilateral rib deformities are identified, likely posttraumatic. Unchanged. 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 Similar appearance of vertebra plana deformity at the T9 level. CT ABDOMEN PELVIS FINDINGS Hepatobiliary: No focal liver abnormality. Gallbladder appears diffusely distended. No signs of gallbladder wall thickening or inflammation. No bile duct dilatation Pancreas: Unremarkable. No pancreatic ductal dilatation or surrounding inflammatory changes. Spleen: Normal in size without focal abnormality. Adrenals/Urinary Tract: Normal appearance of the adrenal  glands. No kidney mass or hydronephrosis. Urinary bladder is unremarkable. Stomach/Bowel: Hiatal hernia. The appendix is not confidently identified separate from the right lower quadrant bowel loops. No small bowel wall thickening, inflammation, or distension. A rectal tube is in place. Within the limitations of unenhanced technique there is wall thickening involving the distal sigmoid colon and rectum with diffuse perirectal soft tissue stranding and edema. This appears new from 01/30/2020. Vascular/Lymphatic: Aortic atherosclerosis. No aneurysm. No abdominopelvic adenopathy identified. Reproductive: Status post hysterectomy. No adnexal masses. Other: No focal fluid collections identified. No signs of pneumoperitoneum. Midline ventral abdominal wall hernia is identified which contains fat only. Musculoskeletal: Previous L5-S1 PLIF with posterior translation of the interbody fusion cage with compression upon the spinal canal. This is unchanged from 01/30/2020. Grade 2 anterolisthesis is identified at this level, unchanged. Chronic fracture deformities involving the right superior and inferior pubic rami noted. IMPRESSION: 1. There is apparent air-filled tract arising off  the left lateral wall of the esophagus at the level of the transverse aortic arch and left mainstem bronchus. Although this may reflect lateral deviation of collapsed esophagus perforation cannot be excluded. If there is a clinical concern for esophageal perforation further evaluation with water-soluble esophagram or repeat CT of the chest following ingestion of water-soluble contrast material is advised. 2. Small bilateral pleural effusions with multifocal areas of lower lung zone subsegmental atelectasis 3. Wall thickening involving the rectosigmoid colon is identified with diffuse perirectal soft tissue stranding/edema. Correlate for any clinical signs or symptoms of inflammatory or proctitis. 4. Aortic Atherosclerosis (ICD10-I70.0) and Emphysema  (ICD10-J43.9). 5. Coronary artery calcifications. 6. 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 Chronic bilateral rib fractures and chronic fractures involving the superior and inferior right pubic rami noted. Electronically Signed   By: Signa Kell M.D.   On: 06/19/2020 12:12   DG Chest 1 View  Result Date: 06/12/2020 CLINICAL DATA:  Shortness of breath EXAM: CHEST  1 VIEW COMPARISON:  January 15, 2020 FINDINGS: There is ill-defined airspace opacity in the right upper lobe. There is scarring in the left base. The heart is enlarged with pulmonary vascularity normal, stable. There is aortic atherosclerosis. Bones are osteoporotic. There is extensive arthropathy in the right shoulder. IMPRESSION: Ill-defined airspace opacity consistent with pneumonia right upper lobe. Scarring left base. Stable cardiac enlargement. Bones osteoporotic. Aortic Atherosclerosis (ICD10-I70.0). Electronically Signed   By: Bretta Bang III M.D.   On: 06/12/2020 14:47   CT HEAD WO CONTRAST  Result Date: 06/18/2020 CLINICAL DATA:  Inpatient. Mental status change after EGD 06/17/2020. Restless. No reported injury. EXAM: CT HEAD WITHOUT CONTRAST TECHNIQUE: Contiguous axial images were obtained from the base of the skull through the vertex without intravenous contrast. COMPARISON:  None. FINDINGS: Brain: Small focus of cortical hypodensity in right occipital lobe (series 2/image 11). Nonspecific moderate subcortical and periventricular white matter hypodensity, most in keeping with chronic small vessel ischemic change. No evidence of parenchymal hemorrhage or extra-axial fluid collection. No mass lesion, mass effect, or midline shift. Cerebral volume is age appropriate. No ventriculomegaly. Vascular: No acute abnormality. Skull: No evidence of calvarial fracture. Sinuses/Orbits: The visualized paranasal sinuses are essentially clear. Other:  The mastoid air cells are unopacified. IMPRESSION: 1. Small focus of  cortical hypodensity in the right occipital lobe, cannot exclude acute/subacute infarct. No acute intracranial hemorrhage. MRI brain without and with IV contrast could be considered for further evaluation as clinically warranted. 2. Moderate chronic small vessel ischemic changes in the cerebral white matter. These results will be called to the ordering clinician or representative by the Radiologist Assistant, and communication documented in the PACS or [employer]. Electronically Signed   By: Delbert Phenix M.D.   On: 06/18/2020 17:53   CT CHEST WO CONTRAST  Result Date: 06/19/2020 CLINICAL DATA:  Follow-up abnormal chest radiograph. Shortness of breath for 1 week. EXAM: CT CHEST, ABDOMEN AND PELVIS WITHOUT CONTRAST TECHNIQUE: Multidetector CT imaging of the chest, abdomen and pelvis was performed following the standard protocol without IV contrast. COMPARISON:  CT AP 01/30/2020 and CTA chest 06/12/2020 FINDINGS: CT CHEST FINDINGS Cardiovascular: Heart size appears mildly enlarged. Aortic atherosclerosis. Coronary artery calcifications. No pericardial effusion. Mediastinum/Nodes: No mediastinal, supraclavicular or axillary adenopathy. There is an apparent small air-filled tract arising off the left lateral wall of the esophagus at the level of the transverse aortic arch chest just proximal to the left mainstem bronchus. This appears to contain a small volume of gas and debris,  image 16/2. No significant pneumo mediastinum identified. This finding may reflect collapsed esophagus with lateral deviation versus esophageal perforation status post EGD. The trachea appears patent and is midline. Normal appearance of the thyroid gland. Lungs/Pleura: Small bilateral pleural effusions are identified, new from previous exam. Subsegmental atelectasis is noted within the posteromedial right lower lobe, lingula and left lower lobe. No pneumothorax identified. Mild changes of centrilobular emphysema with diffuse bronchial  wall thickening. Musculoskeletal: Multiple bilateral rib deformities are identified, likely posttraumatic. Unchanged. 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 Similar appearance of vertebra plana deformity at the T9 level. CT ABDOMEN PELVIS FINDINGS Hepatobiliary: No focal liver abnormality. Gallbladder appears diffusely distended. No signs of gallbladder wall thickening or inflammation. No bile duct dilatation Pancreas: Unremarkable. No pancreatic ductal dilatation or surrounding inflammatory changes. Spleen: Normal in size without focal abnormality. Adrenals/Urinary Tract: Normal appearance of the adrenal glands. No kidney mass or hydronephrosis. Urinary bladder is unremarkable. Stomach/Bowel: Hiatal hernia. The appendix is not confidently identified separate from the right lower quadrant bowel loops. No small bowel wall thickening, inflammation, or distension. A rectal tube is in place. Within the limitations of unenhanced technique there is wall thickening involving the distal sigmoid colon and rectum with diffuse perirectal soft tissue stranding and edema. This appears new from 01/30/2020. Vascular/Lymphatic: Aortic atherosclerosis. No aneurysm. No abdominopelvic adenopathy identified. Reproductive: Status post hysterectomy. No adnexal masses. Other: No focal fluid collections identified. No signs of pneumoperitoneum. Midline ventral abdominal wall hernia is identified which contains fat only. Musculoskeletal: Previous L5-S1 PLIF with posterior translation of the interbody fusion cage with compression upon the spinal canal. This is unchanged from 01/30/2020. Grade 2 anterolisthesis is identified at this level, unchanged. Chronic fracture deformities involving the right superior and inferior pubic rami noted. IMPRESSION: 1. There is apparent air-filled tract arising off the left lateral wall of the esophagus at the level of the transverse aortic arch and left mainstem  bronchus. Although this may reflect lateral deviation of collapsed esophagus perforation cannot be excluded. If there is a clinical concern for esophageal perforation further evaluation with water-soluble esophagram or repeat CT of the chest following ingestion of water-soluble contrast material is advised. 2. Small bilateral pleural effusions with multifocal areas of lower lung zone subsegmental atelectasis 3. Wall thickening involving the rectosigmoid colon is identified with diffuse perirectal soft tissue stranding/edema. Correlate for any clinical signs or symptoms of inflammatory or proctitis. 4. Aortic Atherosclerosis (ICD10-I70.0) and Emphysema (ICD10-J43.9). 5. Coronary artery calcifications. 6. 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 Chronic bilateral rib fractures and chronic fractures involving the superior and inferior right pubic rami noted. Electronically Signed   By: Signa Kell M.D.   On: 06/19/2020 12:12   CT Angio Chest PE W and/or Wo Contrast  Result Date: 06/12/2020 CLINICAL DATA:  80 year old female with shortness of breath. EXAM: CT ANGIOGRAPHY CHEST WITH CONTRAST TECHNIQUE: Multidetector CT imaging of the chest was performed using the standard protocol during bolus administration of intravenous contrast. Multiplanar CT image reconstructions and MIPs were obtained to evaluate the vascular anatomy. CONTRAST:  Seventy-five mL Omnipaque 350, intravenous COMPARISON:  01/04/2020 FINDINGS: Cardiovascular: Satisfactory opacification of the pulmonary arteries to the segmental level. No evidence of pulmonary embolism. Similar appearing moderate global cardiomegaly. Mitral annular calcifications. Severe coronary atherosclerotic calcifications. Scattered atherosclerotic calcifications of the thoracic aorta. No pericardial effusion. Mediastinum/Nodes: No enlarged mediastinal, hilar, or axillary lymph nodes. The esophagus is patulous throughout with a standing column of enteric  contents. Thyroid  gland and trachea demonstrate no significant findings. Lungs/Pleura: Bibasilar and lingular subsegmental atelectasis. Mild upper lobe predominant centrilobular emphysema. No suspicious pulmonary nodules. No pleural effusion or pneumothorax. 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 The visualized upper abdomen is otherwise within normal limits. Musculoskeletal: Progressive compression deformity of the T9 vertebral body with associated kyphosis at this level, now nearly vertebra plana morphology. Mild, unchanged retropulsion. 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 Multilevel chronic, bilateral posterolateral nondisplaced rib fractures. Advanced degenerative changes of the glenohumeral joints bilaterally. No acute osseous abnormality. Review of the MIP images confirms the above findings. IMPRESSION: Vascular: 1. No evidence of pulmonary embolism. 2. Unchanged moderate global cardiomegaly. 3. Severe coronary atherosclerotic calcifications. 4.  Aortic Atherosclerosis (ICD10-I70.0). Non-Vascular: 1. Bibasilar and lingular subsegmental atelectasis. 2. Progressive compression deformity of the T9 vertebral body, now essentially vertebra plana. Unchanged mild retropulsion. Marliss Coots, MD [facility] Electronically Signed   By: Marliss Coots MD   On: 06/12/2020 16:45   MR BRAIN WO CONTRAST  Result Date: 06/19/2020 CLINICAL DATA:  Mental status change. EXAM: MRI HEAD WITHOUT CONTRAST TECHNIQUE: Multiplanar, multiecho pulse sequences of the brain and surrounding structures were obtained without intravenous contrast. COMPARISON:  Head CT June 18, 2020 FINDINGS: The study is partially degraded by motion. Brain: No acute infarction, hemorrhage, hydrocephalus, extra-axial collection or mass lesion. Scattered and confluent foci of T2  hyperintensity are seen within the white matter of the cerebral hemispheres and within the pons, nonspecific, most likely related to chronic small vessel ischemia. 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 Hypodense area described on prior CT corresponds to prominent arachnoid granulation in the right transverse sinus. Prominent vascular susceptibility artifact on the GRE sequence is related to recent administration of ferumoxytol seizure. Vascular: Normal flow voids. Skull and upper cervical spine: Normal marrow signal. Sinuses/Orbits: Negative. Other: None. IMPRESSION: 1. No acute intracranial abnormality. 2. 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 3. Moderate chronic small vessel ischemia. Electronically Signed   By: Baldemar Lenis M.D.   On: 06/19/2020 09:52   US Venous Img Lower  Left (DVT Study)  Result Date: 06/12/2020 CLINICAL DATA:  Lower extremity pain and edema EXAM: LEFT LOWER EXTREMITY VENOUS DUPLEX ULTRASOUND TECHNIQUE: Gray-scale sonography with graded compression, as well as color Doppler and duplex ultrasound were performed to evaluate the left lower extremity deep venous system from the level of the common femoral vein and including the common femoral, femoral, profunda femoral, popliteal and calf veins including the posterior tibial, peroneal and gastrocnemius veins when visible. The superficial great saphenous vein was also interrogated. Spectral Doppler was utilized to evaluate flow at rest and with distal augmentation maneuvers in the common femoral, femoral and popliteal veins. COMPARISON:  None. FINDINGS: Contralateral Common Femoral Vein: Respiratory phasicity is normal and symmetric with the symptomatic side. No evidence of thrombus. Normal compressibility. Common Femoral Vein: No evidence of thrombus. Normal compressibility, respiratory phasicity and response to augmentation. Saphenofemoral Junction: No  evidence of thrombus. Normal compressibility and flow on color Doppler imaging. Profunda Femoral Vein: No evidence of thrombus. Normal compressibility and flow on color Doppler imaging. Femoral Vein: No evidence of thrombus. Normal compressibility, respiratory phasicity and response to augmentation. Popliteal Vein: No evidence of thrombus. Normal compressibility, respiratory phasicity and response to augmentation. Calf Veins: No evidence of thrombus. Normal compressibility and flow on color Doppler imaging. Superficial  Great Saphenous Vein: No evidence of thrombus. Normal compressibility. Venous Reflux:  None. Other Findings:  Soft tissue edema noted in the calf region. IMPRESSION: No evidence of deep venous thrombosis in the left lower extremity. Right common femoral vein patent. There is left calf region soft tissue edema. Electronically Signed   By: Bretta BangWilliam  Woodruff III M.D.   On: 06/12/2020 14:46   DG Chest Port 1 View  Result Date: 06/19/2020 CLINICAL DATA:  Central line placement. EXAM: PORTABLE CHEST 1 VIEW COMPARISON:  April 09/30/2020. FINDINGS: Low lung volumes with bibasilar opacities. No visible pleural effusions or pneumothorax on this portable semi rib upright radiograph. Mild enlargement the cardiac silhouette. Calcific atherosclerosis of the aorta. Right IJ central venous catheter with the tip projecting at the superior cavoatrial junction. Polyarticular degenerative change. IMPRESSION: 1. Right IJ central venous catheter with the tip projecting at the superior cavoatrial junction. 2. Low lung volumes with bibasilar opacities that are better characterized on same day CT chest. Electronically Signed   By: Feliberto HartsFrederick S Jones MD   On: 06/19/2020 11:15   DG CHEST PORT 1 VIEW  Result Date: 06/18/2020 CLINICAL DATA:  Shortness of breath, hypoxia, history hypertension, smoker EXAM: PORTABLE CHEST 1 VIEW COMPARISON:  Portable exam 1043 hours compared to 06/15/2020 FINDINGS: Upper normal heart size.  Mediastinal contours and pulmonary vascularity normal. Atherosclerotic calcification aorta. Bronchitic changes with bibasilar atelectasis. Remaining lungs clear. No pleural effusion or pneumothorax. Bones demineralized with advanced RIGHT glenohumeral degenerative changes. IMPRESSION: Persistent bronchitic changes and bibasilar atelectasis. Aortic Atherosclerosis (ICD10-I70.0). Electronically Signed   By: Ulyses SouthwardMark  Boles M.D.   On: 06/18/2020 11:13   DG CHEST PORT 1 VIEW  Result Date: 06/15/2020 CLINICAL DATA:  Shortness of breath, hypertension, former smoker EXAM: PORTABLE CHEST 1 VIEW COMPARISON:  Portable exam 0656 hours compared to 06/12/2020 FINDINGS: Enlargement of cardiac silhouette. Mediastinal contours and pulmonary vascularity normal. Atherosclerotic calcification aorta. Bibasilar atelectasis. No acute infiltrate, pleural effusion, or pneumothorax. Bones demineralized with advanced degenerative changes of the RIGHT glenohumeral joint. IMPRESSION: Enlargement of cardiac silhouette with bibasilar atelectasis. Aortic Atherosclerosis (ICD10-I70.0). Electronically Signed   By: Ulyses SouthwardMark  Boles M.D.   On: 06/15/2020 08:06   DG Abd 2 Views  Result Date: 06/19/2020 CLINICAL DATA:  Abdominal pain and altered mental status. EXAM: ABDOMEN - 2 VIEW COMPARISON:  CT abdomen and pelvis 01/30/2020 FINDINGS: Scattered gas and stool in the colon. No definite small or large bowel dilatation although paucity of gas limits evaluation. No free intra-abdominal air. No abnormal air-fluid levels. No radiopaque stones. There is evidence of infiltration or atelectasis in both lung bases. Postoperative changes in the lumbosacral spine. Degenerative changes in the lumbar spine and hips. IMPRESSION: 1. Nonobstructive bowel gas pattern. 2. Infiltration or atelectasis in both lung bases. Electronically Signed   By: Burman NievesWilliam  Stevens M.D.   On: 06/19/2020 02:10   ECHOCARDIOGRAM COMPLETE  Result Date: 06/13/2020    ECHOCARDIOGRAM REPORT    Patient Name:   Brenda DrapeBRENDA GAIL Printup Date of Exam: 06/13/2020 Medical Rec #:  [id number]           Height:       64.0 in Accession #:    [id number]          Weight:       157.0 lb Date of Birth:  07/09/1940           BSA:          1.765 m Patient Age:    [age] years  BP:           108/55 mmHg Patient Gender: F                   HR:           91 bpm. [facility] Procedure: 2D Echo Indications:    CHF-Acute Diastolic I50.31  History:        Patient has prior history of Echocardiogram examinations, most                 recent 01/16/2020. Risk Factors:Hypertension, Dyslipidemia and                 Former Smoker.  Sonographer:    Jeryl Columbia RDCS (AE) Referring Phys: [id number] [address] IMPRESSIONS  1. Left ventricular ejection fraction, by estimation, is 55 to 60%. The left ventricle has normal function. The left ventricle has no regional wall motion abnormalities. There is mild concentric left ventricular hypertrophy of the basal-septal segment. Left ventricular diastolic parameters are consistent with Grade I diastolic dysfunction (impaired relaxation).  2. Right ventricular systolic function is normal. The right ventricular size is normal. There is mildly elevated pulmonary artery systolic pressure. The estimated right ventricular systolic pressure is 42.0 mmHg.  3. The mitral valve is grossly normal. No evidence of mitral valve regurgitation. No evidence of mitral stenosis. Severe mitral annular calcification.  4. The aortic valve was not well visualized. There is mild calcification of the aortic valve. Aortic valve regurgitation is not visualized. No aortic stenosis is present.  5. The inferior vena cava is dilated in size with <50% respiratory variability, suggesting right atrial pressure of 15 mmHg. Comparison(s): A prior study was performed on 01/16/2020. Left ventricle is less hyperdynamic. FINDINGS  Left Ventricle: Left ventricular ejection fraction, by estimation, is 55 to 60%. The  left ventricle has normal function. The left ventricle has no regional wall motion abnormalities. The left ventricular internal cavity size was normal in size. There is  mild concentric left ventricular hypertrophy of the basal-septal segment. Left ventricular diastolic parameters are consistent with Grade I diastolic dysfunction (impaired relaxation). Right Ventricle: The right ventricular size is normal. No increase in right ventricular wall thickness. Right ventricular systolic function is normal. There is mildly elevated pulmonary artery systolic pressure. The tricuspid regurgitant velocity is 2.60  m/s, and with an assumed right atrial pressure of 15 mmHg, the estimated right ventricular systolic pressure is 42.0 mmHg. Left Atrium: Left atrial size was normal in size. Right Atrium: Right atrial size was normal in size. Pericardium: There is no evidence of pericardial effusion. Mitral Valve: The mitral valve is grossly normal. There is mild thickening of the mitral valve leaflet(s). There is moderate calcification of the mitral valve leaflet(s). Severe mitral annular calcification. No evidence of mitral valve regurgitation. No evidence of mitral valve stenosis. MV peak gradient, 6.7 mmHg. The mean mitral valve gradient is 3.0 mmHg with average heart rate of 98 bpm. Tricuspid Valve: The tricuspid valve is grossly normal. Tricuspid valve regurgitation is trivial. No evidence of tricuspid stenosis. Aortic Valve: The aortic valve was not well visualized. There is mild calcification of the aortic valve. Aortic valve regurgitation is not visualized. No aortic stenosis is present. Pulmonic Valve: The pulmonic valve was not well visualized. Pulmonic valve regurgitation is not visualized. No evidence of pulmonic stenosis. Aorta: The aortic root is normal in size and structure. Venous: The inferior vena cava is dilated in size with less than 50%  respiratory variability, suggesting right atrial pressure of 15 mmHg.  IAS/Shunts: The atrial septum is grossly normal.  LEFT VENTRICLE PLAX 2D LVIDd:         4.18 cm  Diastology LVIDs:         2.99 cm  LV e' medial:    5.00 cm/s LV PW:         1.40 cm  LV E/e' medial:  18.3 LV IVS:        1.35 cm  LV e' lateral:   4.24 cm/s LVOT diam:     2.10 cm  LV E/e' lateral: 21.5 LVOT Area:     3.46 cm  RIGHT VENTRICLE RV S prime:     14.30 cm/s TAPSE (M-mode): 2.4 cm LEFT ATRIUM             Index       RIGHT ATRIUM           Index LA diam:        4.30 cm 2.44 cm/m  RA Area:     14.40 cm LA Vol (A2C):   52.3 ml 29.63 ml/m RA Volume:   35.60 ml  20.17 ml/m LA Vol (A4C):   48.3 ml 27.37 ml/m LA Biplane Vol: 52.1 ml 29.52 ml/m   AORTA Ao Root diam: 2.50 cm MITRAL VALVE                TRICUSPID VALVE MV Area (PHT): 3.36 cm     TR Peak grad:   27.0 mmHg MV Peak grad:  6.7 mmHg     TR Vmax:        260.00 cm/s MV Mean grad:  3.0 mmHg MV Vmax:       1.29 m/s     SHUNTS MV Vmean:      87.5 cm/s    Systemic Diam: 2.10 cm MV Decel Time: 226 msec MV E velocity: 91.30 cm/s MV A velocity: 120.00 cm/s MV E/A ratio:  0.76 Riley Lam MD Electronically signed by Riley Lam MD Signature Date/Time: 06/13/2020/3:04:50 PM    Final    US Abdomen Limited RUQ (LIVER/GB)  Result Date: 06/15/2020 CLINICAL DATA:  Diffuse abdominal pain. 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 EXAM: ULTRASOUND ABDOMEN LIMITED RIGHT UPPER QUADRANT COMPARISON:  Prior CT scan November 2021 FINDINGS: Gallbladder: No gallstones or wall thickening visualized. No sonographic Murphy sign noted by sonographer. Common bile duct: Diameter: Within normal limits at 5-6 mm Liver: No focal lesion identified. Within normal limits in parenchymal echogenicity. Portal vein is patent on color Doppler imaging with normal direction of blood flow towards the liver. Other: None. IMPRESSION: Negative right upper quadrant ultrasound. Electronically Signed   By: Malachy Moan M.D.   On: 06/15/2020  09:55    Catarina Hartshorn, DO  [facility]  If 7PM-7AM, please contact night-coverage [email or web address] Password [email or web address] 06/20/2020, 7:59 AM   LOS: 8 days

## 2020-06-20 NOTE — Plan of Care (Signed)
  Problem: Safety: Goal: Non-violent Restraint(s) 06/20/2020 0731 by Corrie Mckusick, RN Outcome: Progressing 06/20/2020 0730 by Corrie Mckusick, RN Outcome: Progressing

## 2020-06-20 NOTE — Progress Notes (Signed)
[facility] Progress Note  3 Days Post-Op  Subjective: More awake and appropriate. C dif positive but could be colonized, confirmation pending. Is able to swallow today.   Objective: Vital signs in last 24 hours: Temp:  [97.7 F (36.5 C)-98.3 F (36.8 C)] 97.7 F (36.5 C) (04/09 1100) Pulse Rate:  [44-114] 84 (04/09 0500) Resp:  [8-28] 11 (04/09 0500) BP: (84-153)/(40-103) 138/56 (04/09 0500) SpO2:  [92 %-100 %] 98 % (04/09 0937) Weight:  [64.3 kg] 64.3 kg (04/09 0500) Last BM Date: 06/20/20  Intake/Output from previous day: 04/08 0701 - 04/09 0700 In: 942.9 [I.V.:321.2; IV Piggyback:621.8] Out: 2100 [Urine:1550; Stool:550] Intake/Output this shift: Total I/O In: -  Out: 350 [Urine:350]  General appearance: alert, delirious and mild distress GI: soft, nondistended, nontender  Lab Results:  Recent Labs    06/19/20 0145 06/20/20 0448  WBC 15.2* 10.0  HGB 9.3* 8.3*  HCT 30.9* 29.0*  PLT 291 242   BMET Recent Labs    06/19/20 0145 06/20/20 0448  NA 135 141  K 4.0 3.4*  CL 94* 102  CO2 31 32  GLUCOSE 95 75  BUN 30* 18  CREATININE 1.68* 0.81  CALCIUM 8.1* 8.0*   PT/INR No results for input(s): LABPROT, INR in the last 72 hours.  Studies/Results: CT ABDOMEN PELVIS WO CONTRAST  Result Date: 06/19/2020 CLINICAL DATA:  Follow-up abnormal chest radiograph. Shortness of breath for 1 week. EXAM: CT CHEST, ABDOMEN AND PELVIS WITHOUT CONTRAST TECHNIQUE: Multidetector CT imaging of the chest, abdomen and pelvis was performed following the standard protocol without IV contrast. COMPARISON:  CT AP 01/30/2020 and CTA chest 06/12/2020 FINDINGS: CT CHEST FINDINGS Cardiovascular: Heart size appears mildly enlarged. Aortic atherosclerosis. Coronary artery calcifications. No pericardial effusion. Mediastinum/Nodes: No mediastinal, supraclavicular or axillary adenopathy. There is an apparent small air-filled tract arising off the left lateral wall of the  esophagus at the level of the transverse aortic arch chest just proximal to the left mainstem bronchus. This appears to contain a small volume of gas and debris, image 16/2. No significant pneumo mediastinum identified. This finding may reflect collapsed esophagus with lateral deviation versus esophageal perforation status post EGD. The trachea appears patent and is midline. Normal appearance of the thyroid gland. Lungs/Pleura: Small bilateral pleural effusions are identified, new from previous exam. Subsegmental atelectasis is noted within the posteromedial right lower lobe, lingula and left lower lobe. No pneumothorax identified. Mild changes of centrilobular emphysema with diffuse bronchial wall thickening. Musculoskeletal: Multiple bilateral rib deformities are identified, likely posttraumatic. Unchanged. 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 Similar appearance of vertebra plana deformity at the T9 level. CT ABDOMEN PELVIS FINDINGS Hepatobiliary: No focal liver abnormality. Gallbladder appears diffusely distended. No signs of gallbladder wall thickening or inflammation. No bile duct dilatation Pancreas: Unremarkable. No pancreatic ductal dilatation or surrounding inflammatory changes. Spleen: Normal in size without focal abnormality. Adrenals/Urinary Tract: Normal appearance of the adrenal glands. No kidney mass or hydronephrosis. Urinary bladder is unremarkable. Stomach/Bowel: Hiatal hernia. The appendix is not confidently identified separate from the right lower quadrant bowel loops. No small bowel wall thickening, inflammation, or distension. A rectal tube is in place. Within the limitations of unenhanced technique there is wall thickening involving the distal sigmoid colon and rectum with diffuse perirectal soft tissue stranding and edema. This appears new from 01/30/2020. Vascular/Lymphatic: Aortic atherosclerosis. No aneurysm. No abdominopelvic adenopathy identified.  Reproductive: Status post hysterectomy. No adnexal masses. Other: No focal fluid collections identified. No signs  of pneumoperitoneum. Midline ventral abdominal wall hernia is identified which contains fat only. Musculoskeletal: Previous L5-S1 PLIF with posterior translation of the interbody fusion cage with compression upon the spinal canal. This is unchanged from 01/30/2020. Grade 2 anterolisthesis is identified at this level, unchanged. Chronic fracture deformities involving the right superior and inferior pubic rami noted. IMPRESSION: 1. There is apparent air-filled tract arising off the left lateral wall of the esophagus at the level of the transverse aortic arch and left mainstem bronchus. Although this may reflect lateral deviation of collapsed esophagus perforation cannot be excluded. If there is a clinical concern for esophageal perforation further evaluation with water-soluble esophagram or repeat CT of the chest following ingestion of water-soluble contrast material is advised. 2. Small bilateral pleural effusions with multifocal areas of lower lung zone subsegmental atelectasis 3. Wall thickening involving the rectosigmoid colon is identified with diffuse perirectal soft tissue stranding/edema. Correlate for any clinical signs or symptoms of inflammatory or proctitis. 4. Aortic Atherosclerosis (ICD10-I70.0) and Emphysema (ICD10-J43.9). 5. Coronary artery calcifications. 6. 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 Chronic bilateral rib fractures and chronic fractures involving the superior and inferior right pubic rami noted. Electronically Signed   By: Signa Kell M.D.   On: 06/19/2020 12:12   CT HEAD WO CONTRAST  Result Date: 06/18/2020 CLINICAL DATA:  Inpatient. Mental status change after EGD 06/17/2020. Restless. No reported injury. EXAM: CT HEAD WITHOUT CONTRAST TECHNIQUE: Contiguous axial images were obtained from the base of the skull through the vertex without  intravenous contrast. COMPARISON:  None. FINDINGS: Brain: Small focus of cortical hypodensity in right occipital lobe (series 2/image 11). Nonspecific moderate subcortical and periventricular white matter hypodensity, most in keeping with chronic small vessel ischemic change. No evidence of parenchymal hemorrhage or extra-axial fluid collection. No mass lesion, mass effect, or midline shift. Cerebral volume is age appropriate. No ventriculomegaly. Vascular: No acute abnormality. Skull: No evidence of calvarial fracture. Sinuses/Orbits: The visualized paranasal sinuses are essentially clear. Other:  The mastoid air cells are unopacified. IMPRESSION: 1. Small focus of cortical hypodensity in the right occipital lobe, cannot exclude acute/subacute infarct. No acute intracranial hemorrhage. MRI brain without and with IV contrast could be considered for further evaluation as clinically warranted. 2. Moderate chronic small vessel ischemic changes in the cerebral white matter. These results will be called to the ordering clinician or representative by the Radiologist Assistant, and communication documented in the PACS or [employer]. Electronically Signed   By: Delbert Phenix M.D.   On: 06/18/2020 17:53   CT CHEST WO CONTRAST  Result Date: 06/19/2020 CLINICAL DATA:  Follow-up abnormal chest radiograph. Shortness of breath for 1 week. EXAM: CT CHEST, ABDOMEN AND PELVIS WITHOUT CONTRAST TECHNIQUE: Multidetector CT imaging of the chest, abdomen and pelvis was performed following the standard protocol without IV contrast. COMPARISON:  CT AP 01/30/2020 and CTA chest 06/12/2020 FINDINGS: CT CHEST FINDINGS Cardiovascular: Heart size appears mildly enlarged. Aortic atherosclerosis. Coronary artery calcifications. No pericardial effusion. Mediastinum/Nodes: No mediastinal, supraclavicular or axillary adenopathy. There is an apparent small air-filled tract arising off the left lateral wall of the esophagus at the level of the  transverse aortic arch chest just proximal to the left mainstem bronchus. This appears to contain a small volume of gas and debris, image 16/2. No significant pneumo mediastinum identified. This finding may reflect collapsed esophagus with lateral deviation versus esophageal perforation status post EGD. The trachea appears patent and is midline. Normal appearance of the thyroid gland. Lungs/Pleura: Small bilateral  pleural effusions are identified, new from previous exam. Subsegmental atelectasis is noted within the posteromedial right lower lobe, lingula and left lower lobe. No pneumothorax identified. Mild changes of centrilobular emphysema with diffuse bronchial wall thickening. Musculoskeletal: Multiple bilateral rib deformities are identified, likely posttraumatic. Unchanged. 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 Similar appearance of vertebra plana deformity at the T9 level. CT ABDOMEN PELVIS FINDINGS Hepatobiliary: No focal liver abnormality. Gallbladder appears diffusely distended. No signs of gallbladder wall thickening or inflammation. No bile duct dilatation Pancreas: Unremarkable. No pancreatic ductal dilatation or surrounding inflammatory changes. Spleen: Normal in size without focal abnormality. Adrenals/Urinary Tract: Normal appearance of the adrenal glands. No kidney mass or hydronephrosis. Urinary bladder is unremarkable. Stomach/Bowel: Hiatal hernia. The appendix is not confidently identified separate from the right lower quadrant bowel loops. No small bowel wall thickening, inflammation, or distension. A rectal tube is in place. Within the limitations of unenhanced technique there is wall thickening involving the distal sigmoid colon and rectum with diffuse perirectal soft tissue stranding and edema. This appears new from 01/30/2020. Vascular/Lymphatic: Aortic atherosclerosis. No aneurysm. No abdominopelvic adenopathy identified. Reproductive: Status post  hysterectomy. No adnexal masses. Other: No focal fluid collections identified. No signs of pneumoperitoneum. Midline ventral abdominal wall hernia is identified which contains fat only. Musculoskeletal: Previous L5-S1 PLIF with posterior translation of the interbody fusion cage with compression upon the spinal canal. This is unchanged from 01/30/2020. Grade 2 anterolisthesis is identified at this level, unchanged. Chronic fracture deformities involving the right superior and inferior pubic rami noted. IMPRESSION: 1. There is apparent air-filled tract arising off the left lateral wall of the esophagus at the level of the transverse aortic arch and left mainstem bronchus. Although this may reflect lateral deviation of collapsed esophagus perforation cannot be excluded. If there is a clinical concern for esophageal perforation further evaluation with water-soluble esophagram or repeat CT of the chest following ingestion of water-soluble contrast material is advised. 2. Small bilateral pleural effusions with multifocal areas of lower lung zone subsegmental atelectasis 3. Wall thickening involving the rectosigmoid colon is identified with diffuse perirectal soft tissue stranding/edema. Correlate for any clinical signs or symptoms of inflammatory or proctitis. 4. Aortic Atherosclerosis (ICD10-I70.0) and Emphysema (ICD10-J43.9). 5. Coronary artery calcifications. 6. 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 Chronic bilateral rib fractures and chronic fractures involving the superior and inferior right pubic rami noted. Electronically Signed   By: Signa Kell M.D.   On: 06/19/2020 12:12   MR BRAIN WO CONTRAST  Result Date: 06/19/2020 CLINICAL DATA:  Mental status change. EXAM: MRI HEAD WITHOUT CONTRAST TECHNIQUE: Multiplanar, multiecho pulse sequences of the brain and surrounding structures were obtained without intravenous contrast. COMPARISON:  Head CT June 18, 2020 FINDINGS: The study is  partially degraded by motion. Brain: No acute infarction, hemorrhage, hydrocephalus, extra-axial collection or mass lesion. Scattered and confluent foci of T2 hyperintensity are seen within the white matter of the cerebral hemispheres and within the pons, nonspecific, most likely related to chronic small vessel ischemia. 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 Hypodense area described on prior CT corresponds to prominent arachnoid granulation in the right transverse sinus. Prominent vascular susceptibility artifact on the GRE sequence is related to recent administration of ferumoxytol seizure. Vascular: Normal flow voids. Skull and upper cervical spine: Normal marrow signal. Sinuses/Orbits: Negative. Other: None. IMPRESSION: 1. No acute intracranial abnormality. 2. 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 3.  Moderate chronic small vessel ischemia. Electronically Signed   By: Baldemar Lenis M.D.   On: 06/19/2020 09:52   DG Chest Port 1 View  Result Date: 06/19/2020 CLINICAL DATA:  Central line placement. EXAM: PORTABLE CHEST 1 VIEW COMPARISON:  April 09/30/2020. FINDINGS: Low lung volumes with bibasilar opacities. No visible pleural effusions or pneumothorax on this portable semi rib upright radiograph. Mild enlargement the cardiac silhouette. Calcific atherosclerosis of the aorta. Right IJ central venous catheter with the tip projecting at the superior cavoatrial junction. Polyarticular degenerative change. IMPRESSION: 1. Right IJ central venous catheter with the tip projecting at the superior cavoatrial junction. 2. Low lung volumes with bibasilar opacities that are better characterized on same day CT chest. Electronically Signed   By: Feliberto Harts MD   On: 06/19/2020 11:15   DG Abd 2 Views  Result Date: 06/19/2020 CLINICAL DATA:  Abdominal pain and altered mental status. EXAM: ABDOMEN - 2 VIEW COMPARISON:  CT abdomen and  pelvis 01/30/2020 FINDINGS: Scattered gas and stool in the colon. No definite small or large bowel dilatation although paucity of gas limits evaluation. No free intra-abdominal air. No abnormal air-fluid levels. No radiopaque stones. There is evidence of infiltration or atelectasis in both lung bases. Postoperative changes in the lumbosacral spine. Degenerative changes in the lumbar spine and hips. IMPRESSION: 1. Nonobstructive bowel gas pattern. 2. Infiltration or atelectasis in both lung bases. Electronically Signed   By: Burman Nieves M.D.   On: 06/19/2020 02:10    Anti-infectives: Anti-infectives (From admission, onward)   Start     Dose/Rate Route Frequency Ordered Stop   06/20/20 1200  vancomycin (VANCOCIN) IVPB 1000 mg/200 mL premix  Status:  Discontinued        1,000 mg 200 mL/hr over 60 Minutes Intravenous Every 48 hours 06/18/20 1132 06/20/20 0805   06/19/20 0815  metroNIDAZOLE (FLAGYL) IVPB 500 mg        500 mg 100 mL/hr over 60 Minutes Intravenous Every 8 hours 06/19/20 0729     06/18/20 1215  vancomycin (VANCOREADY) IVPB 1500 mg/300 mL        1,500 mg 150 mL/hr over 120 Minutes Intravenous  Once 06/18/20 1125 06/18/20 1900   06/18/20 1145  ceFEPIme (MAXIPIME) 2 g in sodium chloride 0.9 % 100 mL IVPB        2 g 200 mL/hr over 30 Minutes Intravenous Every 24 hours 06/18/20 1125     06/12/20 1500  cefTRIAXone (ROCEPHIN) 1 g in sodium chloride 0.9 % 100 mL IVPB        1 g 200 mL/hr over 30 Minutes Intravenous  Once 06/12/20 1454 06/12/20 1756   06/12/20 1500  azithromycin (ZITHROMAX) 500 mg in sodium chloride 0.9 % 250 mL IVPB        500 mg 250 mL/hr over 60 Minutes Intravenous  Once 06/12/20 1454 06/12/20 1904      Assessment/Plan: Ms. Croson is a 80 yo with septic shock potentially from colitis. CT chest ordered today with oral contrast to ensure no signs of perforation given questions yesterday on CT chest, low suspicion.   Diet pending CT Continue antibiotics C dif  confirmation pending     LOS: 8 days    Brenda Hopkins 06/20/2020

## 2020-06-21 DIAGNOSIS — N179 Acute kidney failure, unspecified: Secondary | ICD-10-CM | POA: Diagnosis not present

## 2020-06-21 DIAGNOSIS — J9601 Acute respiratory failure with hypoxia: Secondary | ICD-10-CM | POA: Diagnosis not present

## 2020-06-21 DIAGNOSIS — I5033 Acute on chronic diastolic (congestive) heart failure: Secondary | ICD-10-CM | POA: Diagnosis not present

## 2020-06-21 DIAGNOSIS — G9341 Metabolic encephalopathy: Secondary | ICD-10-CM | POA: Diagnosis not present

## 2020-06-21 LAB — GASTROINTESTINAL PANEL BY PCR, STOOL (REPLACES STOOL CULTURE)

## 2020-06-21 LAB — COMPREHENSIVE METABOLIC PANEL
ALT: 18 U/L (ref 0–44)
AST: 21 U/L (ref 15–41)
Albumin: 2.8 g/dL — ABNORMAL LOW (ref 3.5–5.0)
Alkaline Phosphatase: 109 U/L (ref 38–126)
Anion gap: 12 (ref 5–15)
BUN: 12 mg/dL (ref 8–23)
CO2: 26 mmol/L (ref 22–32)
Calcium: 8.4 mg/dL — ABNORMAL LOW (ref 8.9–10.3)
Chloride: 101 mmol/L (ref 98–111)
Creatinine, Ser: 0.79 mg/dL (ref 0.44–1.00)
GFR, Estimated: >60 mL/min (ref >60)
Glucose, Bld: 153 mg/dL — ABNORMAL HIGH (ref 70–99)
Potassium: 3 mmol/L — ABNORMAL LOW (ref 3.5–5.1)
Sodium: 139 mmol/L (ref 135–145)
Total Bilirubin: 0.8 mg/dL (ref 0.3–1.2)
Total Protein: 6.2 g/dL — ABNORMAL LOW (ref 6.5–8.1)

## 2020-06-21 LAB — CBC
HCT: 34.1 % — ABNORMAL LOW (ref 36.0–46.0)
Hemoglobin: 10.1 g/dL — ABNORMAL LOW (ref 12.0–15.0)
MCH: 26.6 pg (ref 26.0–34.0)
MCHC: 29.6 g/dL — ABNORMAL LOW (ref 30.0–36.0)
MCV: 89.7 fL (ref 80.0–100.0)
Platelets: 311 10*3/uL (ref 150–400)
RBC: 3.8 MIL/uL — ABNORMAL LOW (ref 3.87–5.11)
RDW: 17.9 % — ABNORMAL HIGH (ref 11.5–15.5)
WBC: 9.8 10*3/uL (ref 4.0–10.5)
nRBC: 0 % (ref 0.0–0.2)

## 2020-06-21 LAB — MAGNESIUM: Magnesium: 1.5 mg/dL — ABNORMAL LOW (ref 1.7–2.4)

## 2020-06-21 MED ORDER — OXYCODONE HCL 5 MG PO TABS
5.0000 mg | ORAL_TABLET | Freq: Four times a day (QID) | ORAL | Status: DC | PRN
  Administered 2020-06-21 – 2020-06-23 (×4): 5 mg via ORAL
  Filled 2020-06-21 (×4): qty 1

## 2020-06-21 MED ORDER — LOPERAMIDE HCL 2 MG PO CAPS
2.0000 mg | ORAL_CAPSULE | Freq: Three times a day (TID) | ORAL | Status: DC
  Administered 2020-06-21 – 2020-06-22 (×2): 2 mg via ORAL
  Filled 2020-06-21 (×3): qty 1

## 2020-06-21 MED ORDER — AMLODIPINE BESYLATE 5 MG PO TABS
5.0000 mg | ORAL_TABLET | Freq: Every day | ORAL | Status: DC
  Administered 2020-06-21 – 2020-06-24 (×4): 5 mg via ORAL
  Filled 2020-06-21 (×4): qty 1

## 2020-06-21 MED ORDER — METOPROLOL TARTRATE 5 MG/5ML IV SOLN
2.5000 mg | Freq: Four times a day (QID) | INTRAVENOUS | Status: DC
  Administered 2020-06-21 – 2020-06-22 (×4): 2.5 mg via INTRAVENOUS
  Filled 2020-06-21 (×4): qty 5

## 2020-06-21 MED ORDER — VANCOMYCIN 50 MG/ML ORAL SOLUTION
125.0000 mg | Freq: Four times a day (QID) | ORAL | Status: DC
  Administered 2020-06-21 – 2020-06-24 (×11): 125 mg via ORAL
  Filled 2020-06-21 (×23): qty 2.5

## 2020-06-21 MED ORDER — SODIUM CHLORIDE 0.9 % IV SOLN
2.0000 g | Freq: Two times a day (BID) | INTRAVENOUS | Status: DC
  Administered 2020-06-21 – 2020-06-23 (×4): 2 g via INTRAVENOUS
  Filled 2020-06-21 (×4): qty 2

## 2020-06-21 NOTE — Progress Notes (Addendum)
Pharmacy Antibiotic Note  Brenda Hopkins is a 80 y.o. female admitted on 06/12/2020 with sepsis.  Pharmacy has been consulted for Cefepime dosing. Patient with Septic shock-due to pneumonia and intraabdominal source. AF, WBC normalized.  PCT5.85>>7.15>>2.43, trending down. Renal function improved.   Plan: Continue Cefepime 2000 mg IV every 12 hours. Continue Flagyl 500mg  IV q8h Monitor labs, c/s  Height: 5\' 4"  (162.6 cm) Weight: 59.6 kg (131 lb 6.3 oz) IBW/kg (Calculated) : 54.7  Temp (24hrs), Avg:97.9 F (36.6 C), Min:97.4 F (36.3 C), Max:98.3 F (36.8 C)  Recent Labs  Lab 06/17/20 0648 06/18/20 0505 06/18/20 1118 06/19/20 0145 06/20/20 0448 06/21/20 1017  WBC  --  19.5* 18.8* 15.2* 10.0 9.8  CREATININE 1.16* 1.74*  --  1.68* 0.81 0.79  LATICACIDVEN  --   --   --  1.2  --   --     Estimated Creatinine Clearance: 50.9 mL/min (by C-G formula based on SCr of 0.79 mg/dL).    Allergies  Allergen Reactions  . Prozac [Fluoxetine Hcl] Other (See Comments)    Makes her fell crazy  . Zoloft [Sertraline Hcl]     Made her feel crazy--10/2013  . Nicoderm [Nicotine] Rash    Site rash    Antimicrobials this admission: Vanco 4/7 >> 4/9 Cefepime 4/7 >>  Flagyl 4/8 >>  Microbiology results: 4/7 BCx: ngtd 4/7 UCx: <10K CFU/ml, insignificant growth  Thank you for allowing pharmacy to be a part of this patient's care. 6/7, BS Pharm D, 6/7 Clinical Pharmacist Pager [phone] 06/21/2020 11:21 AM

## 2020-06-21 NOTE — Progress Notes (Signed)
7:59 PM RN called because daughter wants to speak to a physician.  She left her name and phone number Porfirio Mylar [phone]).  I called the daughter and updated her on patient's diarrhea and need to obtain C. difficile by PCR to confirm and that patient is already tolerating full liquids without any difficulty per [facility] medical record.  Patient was told that the day physician will call tomorrow to update her on patient's medical condition. She states that she can be called anytime from 1 PM and that she will come to the hospital around 4 PM.

## 2020-06-21 NOTE — Progress Notes (Signed)
Subjective:  Patient denies abdominal pain.  According to nursing staff she has been able to handle full liquids without any difficulty.  She remains with loose watery stools.  She is having multiple stools.  No melena or rectal bleeding.  According to nursing staff she has been restless and confused and is restrained for safety.  Current Medications:  Current Facility-Administered Medications:  .  0.9 %  sodium chloride infusion, 250 mL, Intravenous, PRN, Elgergawy, Silver Huguenin, MD, Stopped at 06/18/20 1900 .  acetaminophen (TYLENOL) tablet 650 mg, 650 mg, Oral, Q4H PRN, Elgergawy, Silver Huguenin, MD, 650 mg at 06/16/20 0505 .  alum & mag hydroxide-simeth (MAALOX/MYLANTA) 200-200-20 MG/5ML suspension 30 mL, 30 mL, Oral, Q4H PRN, Shahmehdi, Seyed A, MD, 30 mL at 06/13/20 1941 .  amLODipine (NORVASC) tablet 5 mg, 5 mg, Oral, Daily, Tat, David, MD, 5 mg at 06/21/20 1123 .  atorvastatin (LIPITOR) tablet 20 mg, 20 mg, Oral, QHS, Shahmehdi, Seyed A, MD, 20 mg at 06/17/20 2145 .  budesonide (PULMICORT) nebulizer solution 0.5 mg, 0.5 mg, Nebulization, BID, Tat, David, MD, 0.5 mg at 06/21/20 0841 .  ceFEPIme (MAXIPIME) 2 g in sodium chloride 0.9 % 100 mL IVPB, 2 g, Intravenous, Q12H, Tat, David, MD .  Chlorhexidine Gluconate Cloth 2 % PADS 6 each, 6 each, Topical, Daily, Tat, David, MD, 6 each at 06/21/20 0940 .  fentaNYL (SUBLIMAZE) injection 25 mcg, 25 mcg, Intravenous, U8K PRN, Pershing Proud, NP, 25 mcg at 06/19/20 0020 .  haloperidol lactate (HALDOL) injection 5 mg, 5 mg, Intravenous, Q6H PRN, Tat, David, MD, 5 mg at 06/21/20 0259 .  ipratropium-albuterol (DUONEB) 0.5-2.5 (3) MG/3ML nebulizer solution 3 mL, 3 mL, Nebulization, TID, Montez Morita, Daniel, MD, 3 mL at 06/21/20 1314 .  LORazepam (ATIVAN) injection 0.5 mg, 0.5 mg, Intravenous, Q6H PRN, Tat, David, MD, 0.5 mg at 06/21/20 1303 .  metoprolol tartrate (LOPRESSOR) injection 2.5 mg, 2.5 mg, Intravenous, Q6H, Tat, David, MD, 2.5 mg at 06/21/20  1123 .  metroNIDAZOLE (FLAGYL) IVPB 500 mg, 500 mg, Intravenous, Q8H, Tat, David, MD, Last Rate: 100 mL/hr at 06/21/20 0802, 500 mg at 06/21/20 0802 .  nitroGLYCERIN (NITROSTAT) SL tablet 0.4 mg, 0.4 mg, Sublingual, Q5 min PRN, Shahmehdi, Seyed A, MD, 0.4 mg at 06/13/20 1332 .  norepinephrine (LEVOPHED) 65m in 2550mpremix infusion, 0-40 mcg/min, Intravenous, Titrated, Tat, DaShanon BrowMD, Stopped at 06/19/20 1905 .  ondansetron (ZOFRAN) injection 4 mg, 4 mg, Intravenous, Q6H PRN, Elgergawy, DaSilver HugueninMD, 4 mg at 06/16/20 0842 .  oxyCODONE (Oxy IR/ROXICODONE) immediate release tablet 5 mg, 5 mg, Oral, Q6H PRN, Tat, David, MD .  pantoprazole (PROTONIX) injection 40 mg, 40 mg, Intravenous, Q12H, Adefeso, Oladapo, DO .  sodium chloride flush (NS) 0.9 % injection 3 mL, 3 mL, Intravenous, Q12H, Elgergawy, DaSilver HugueninMD, 3 mL at 06/21/20 0940 .  sodium chloride flush (NS) 0.9 % injection 3 mL, 3 mL, Intravenous, PRN, Elgergawy, Dawood S, MD .  sucralfate (CARAFATE) 1 GM/10ML suspension 1 g, 1 g, Oral, TID with meals, Shahmehdi, Seyed A, MD, 1 g at 06/21/20 1200    Objective: Blood pressure (!) 171/88, pulse 61, temperature 98 F (36.7 C), temperature source Oral, resp. rate 16, height '5\' 4"'  (1.626 m), weight 59.6 kg, SpO2 97 %. Patient is alert and does not appear to be in any distress. She responds appropriate to questions. No neck masses or thyromegaly. Cardiac exam with regular rhythm normal S1 and S2.  No murmur gallop noted. Abdomen is  symmetrical with ventral hernia in the upper midline.  Bowel sounds are normal.  On palpation abdomen is soft and nontender with organomegaly or masses.  Labs/studies Results:  CBC Latest Ref Rng & Units 06/21/2020 06/20/2020 06/19/2020  WBC 4.0 - 10.5 K/uL 9.8 10.0 15.2(H)  Hemoglobin 12.0 - 15.0 g/dL 10.1(L) 8.3(L) 9.3(L)  Hematocrit 36.0 - 46.0 % 34.1(L) 29.0(L) 30.9(L)  Platelets 150 - 400 K/uL 311 242 291    CMP Latest Ref Rng & Units 06/21/2020 06/20/2020 06/19/2020   Glucose 70 - 99 mg/dL 153(H) 75 95  BUN 8 - 23 mg/dL 12 18 30(H)  Creatinine 0.44 - 1.00 mg/dL 0.79 0.81 1.68(H)  Sodium 135 - 145 mmol/L 139 141 135  Potassium 3.5 - 5.1 mmol/L 3.0(L) 3.4(L) 4.0  Chloride 98 - 111 mmol/L 101 102 94(L)  CO2 22 - 32 mmol/L 26 32 31  Calcium 8.9 - 10.3 mg/dL 8.4(L) 8.0(L) 8.1(L)  Total Protein 6.5 - 8.1 g/dL 6.2(L) 4.7(L) 5.3(L)  Total Bilirubin 0.3 - 1.2 mg/dL 0.8 0.8 1.0  Alkaline Phos 38 - 126 U/L 109 95 113  AST 15 - 41 U/L '21 17 26  ' ALT 0 - 44 U/L '18 14 17    ' Hepatic Function Latest Ref Rng & Units 06/21/2020 06/20/2020 06/19/2020  Total Protein 6.5 - 8.1 g/dL 6.2(L) 4.7(L) 5.3(L)  Albumin 3.5 - 5.0 g/dL 2.8(L) 2.1(L) 2.4(L)  AST 15 - 41 U/L '21 17 26  ' ALT 0 - 44 U/L '18 14 17  ' Alk Phosphatase 38 - 126 U/L 109 95 113  Total Bilirubin 0.3 - 1.2 mg/dL 0.8 0.8 1.0  Bilirubin, Direct 0.0 - 0.2 mg/dL - - -    C. difficile antigen positive but toxin negative.  Assessment:  #1.  Nausea and vomiting secondary to gastrojejunal anastomotic ulceration with stricture which was dilated to 12 mm 4 days ago.  She is tolerating full liquids. She will need repeat dilation in 6 to 8 weeks or earlier depending on her clinical course.  #2.  Diarrhea.  Suspect diarrhea due to C. difficile colitis even though C. difficile toxin is negative.  She is on cefepime and metronidazole for pneumonia and it is a given that she will develop full-blown syndrome with C. difficile colitis unless she already had.  IV metronidazole unfortunately would not help in the management of C. difficile induced colitis and diarrhea.  Therefore starting therapy now would be appropriate while checking PCR.  #3.  Septic shock most likely secondary to pneumonia.  Patient is on cefepime and metronidazole.  Patient was on Levophed until 2 days ago.  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  Therefore we are dealing with a  mediastinal cyst rather than diverticulum.  #5.  Encephalopathy felt to be metabolic and due to acute illness.  Patient is requiring restraints for her own safety.  #6 Anemia.  Anemia appears to be multifactorial.  She did have melena on admission.  Recommendations  Advance diet to pured diet. C. difficile by PCR. vancomycin 125 mg p.o. every 6 hours. Loperamide 2 mg p.o. 3 times daily x6 doses.

## 2020-06-21 NOTE — Progress Notes (Addendum)
PROGRESS NOTE  Brenda Hopkins NFA:[id number] DOB: 1940/03/30 DOA: 06/12/2020 PCP: Shawnie Dapper, PA-C  Brief History: 80 y.o.female,with medical history significant forhypertension, anxiety, marginalulcer, tobacco abuseandchronic paindue to complaints of shortness of breathof one week duration. Shereports dyspneawith exertion, and laying flat, as well she does report some epigastric/lower chest pain, does not radiate, as well she does report worsening lower extremity edema.she was recently seen by her PCP for lower extremity edema which she was started on Lasix for last 4 days, report was told to have A. fib, and referral has been made to cardiologist but she did not get to see themprior to admission. She has a significant tobacco hx of >50 pack years. In the ED,BNP of 1270, chest x-ray significant for vascular congestion, EKG nonacute, troponins non-ACS pattern 71>63,was given IV Lasix, and [facility] hospitalist consulted to admit for CHF On 06/18/20, the patient was moved to ICU due to concerns of hematochezia and low BP. She became more encephalopathic which was a change from her prior mental status. Work up for AMS was undertaken. It was felt this was primarily driven by new septic like clinical picture. The patient was started on empiric vanc and cefepime initially. She remained hypotensive despite fluid resuscitation and levophed was started. GOC was discussed with patient's daughter and patient was changed to DNR. Palliative medicine was consulted.  Fortunately, the patient stabilized with IV fluids, IV abx and vasopressor support.  Her levophed was weaned off 06/19/20.  Assessment/Plan: Acute metabolic Encephalopathy -06/18/20--developed acute encephalopathy -4/7/22ABG 7.461/48/56/33 (0.36) -due to sepsis, AKI, hypoxia -EKG-sinus, nonspecific ST changes -ammonia--23 -B12 205 -check PCT5.85>>7.15 -d/c gabapentin, seroquel. Requip, oxycodoone -CT  brain--Small focus of cortical hypodensity in the right occipital lobe -MR brain--no acute findings  Acute on chronic diastolic CHF -BNP 1270 >>>589, 282, 198 -NEG 11L since admissionprior to transfer to ICU 4/7 -Continue IV Lasix40 mg daily>>po lasix 06/17/20--now on hold due to hypotension/sepsis -Monitoring I's and O's, daily weight -2D echo--ejection fraction 55-60%,G1DD, no WMA, mild elevated PASP -appreciateconsulting cardiology -CTAchest--negpulmonary embolis;cardiomegaly, severe coronary atherosclerosis, aortic atherosclerosis,.. Bilateral sublingual atelectasis, T9 vertebral compression deformity -restart lasix if renal function stable  Acute respiratory failure with hypoxia  -due to CHF in the setting of underlying COPD -increased to 4L am 06/18/20>>3L -CXR--personally reviewed--increase RLL atelectasis, ?LLL opacity -06/18/20 ABG7.461/48/56/33 (0.36) -wean for saturation >90% -continue bronchodilators  Hematochezia/Colitis -GI notified, following -developed evening 06/17/20 -06/17/20 EGD--s/p Roux-en-Y with ulcer(decreased in size)and stenosis at GJ anastomosis -Hgb 10.2 in am 06/18/20>>9.3 -serial H/H -continue protonix drip>>IV protonix bid -4/8 CT abd--Wall thickening involving the rectosigmoid colon is identified with diffuse perirectal soft tissue stranding/edema -continue cefepime and metronidazole -C diff assay-->colonization -follow stool pathogen panel  ??Esophageal tear-->ruled out -4/8 CT chest--air-filled tract arising off the left lateral wall of the esophagus at the level of the transverse aortic arch and left mainstem bronchus. Although this may reflect lateral deviation of collapsed esophagus perforation cannot be excluded -remain NPO -barium esophagram will need to be done   Septic shock -check PCT5.85>>7.15>>2.43 -lactate 1.2 -due to pneumonia and intraabdominal source  -4/7 blood culture--neg to date -4/7 UA--no  pyuria -continueempiric cefepime metronidazole -4/9 CT chest--Narrow necked diverticulum arising from the distal esophagus versus mediastinal cyst involving the lower mediastinum.  No leak.  More confluent airspace consolidation deep in the RIGHT LOWER LOBE, query superimposed pneumonia. Mild ground-glass airspace opacity in the lingula is also felt to be infectious or inflammatory. -CT chest/abd--as above -  start levophed>>weaned off 4/8  Lobar Pneumonia -CT chest as above -continue cefepime -stable on 3L  AKI -baseline creatinine 0.6-0.9 -peaking at 1.74>>1.68>>0.81 -due to hypotension, volume depletion -improved  Questionable Atrial Fibrillation -No definitive atrial fibrillation noted on telemetry-->personally reviewed--sinus with PACs -appreciate cardiology input -One strip labeled as 10 beats NSVT overnight but appears more consistent with narrow-complex tachycardia. K+was5.7 on 4/5/22and Kayexalate has been administered. Mg stable at 2.1. -personally reviewed EKG--sinus, PACs, nonspecific T wave changes  Coronary Calcification on CT -has intermittent CP, reproducible, partly due to GI etiology also -troponins flat -outpt stress per cardiology -no anginal symptoms  Epigastricandchest pain -06/17/20 EGD--s/p Roux-en-Y with ulcer and stenosis at GJ anastomosis -Post prandial with every meal developed gastric pain then followed with nausea vomiting -ContinueProtonix, Carafate -Consulting gastroenterology >>>appreciate input -Ultrasound right upper quadrant negative for cholelithiasis or cholecystitis  Iron deficiency Anemia -iron saturation 7 -ferritin 12 -feraheme x 1on 06/17/20  Hyperlipidemia -continue statin  HTN -holdingamlodipine  Chronic pain syndrome -continue gabapentin -continue home dose oxycodone -PMDP reviewed  Hyperkalemia -improved with kayexalate  Hypertension -increase IV lopressor to q6 hours -restart  amlodipine      Status is: Inpatient  Remains inpatient appropriate because:IV treatments appropriate due to intensity of illness or inability to take PO   Dispo: The patient is from:Home Anticipated d/c is ZO:XWRU Patient currently is not medically stable to d/c. Difficult to place patient No    The patient is critically ill with multiple organ systems failure and requires high complexity decision making for assessment and support, frequent evaluation and titration of therapies, application of advanced monitoring technologies and extensive interpretation of multiple databases.  Critical care time - 35 mins.      Family Communication:daughter updated 06/19/20  Consultants:cardiology  Code Status: DNR  DVT Prophylaxis: SCDs   Procedures: As Listed in Progress Note Above  Antibiotics: Cefepime 4/7>> vanc 4/7>> Metronidazole 4/8>>   Subjective: Patient is awake and alert.  Pleasantly confused.  Intermittently agitated.  Denies cp, sob, abd pain, n/v  Objective: Vitals:   06/21/20 0627 06/21/20 0700 06/21/20 0751 06/21/20 0841  BP: (!) 164/87 (!) 175/87    Pulse: 82 75 86   Resp: (!) 21 14 10    Temp:   (!) 97.4 F (36.3 C)   TempSrc:   Oral   SpO2: 97% 98% 97% 97%  Weight:      Height:        Intake/Output Summary (Last 24 hours) at 06/21/2020 0959 Last data filed at 06/21/2020 0400 Gross per 24 hour  Intake 378.17 ml  Output 2000 ml  Net -1621.83 ml   Weight change: -4.7 kg Exam:   General:  Pt is alert, follows commands appropriately, not in acute distress  HEENT: No icterus, No thrush, No neck mass, Dix/AT  Cardiovascular: RRR, +PACs S1/S2, no rubs, no gallops  Respiratory: bibasilar rales. No wheeze  Abdomen: Soft/+BS, non tender, non distended, no guarding  Extremities: No edema, No lymphangitis, No petechiae, No rashes, no synovitis   Data Reviewed: I have personally  reviewed following labs and imaging studies Basic Metabolic Panel: Recent Labs  Lab 06/16/20 0454 06/16/20 1344 06/17/20 0648 06/18/20 0505 06/19/20 0145 06/20/20 0448  NA 135 141 137 131* 135 141  K 5.7* 4.8 4.6 5.2* 4.0 3.4*  CL 89* 91* 89* 87* 94* 102  CO2 36* 36* 35* 30 31 32  GLUCOSE 94 82 84 182* 95 75  BUN 19 19 19  27* 30* 18  CREATININE 1.05* 1.03*  1.16* 1.74* 1.68* 0.81  CALCIUM 8.7* 8.8* 8.7* 8.7* 8.1* 8.0*  MG 2.1  --   --   --   --  1.8   Liver Function Tests: Recent Labs  Lab 06/15/20 1204 06/18/20 0505 06/19/20 0145 06/20/20 0448  AST ALT ALKPHOS 99 157* 113 95  BILITOT 0.6 1.3* 1.0 0.8  PROT 6.4* 5.3* 5.3* 4.7*  ALBUMIN 3.0* 2.7* 2.4* 2.1*   Recent Labs  Lab 06/19/20 0145  LIPASE 19  AMYLASE 72   Recent Labs  Lab 06/18/20 1118  AMMONIA 23   Coagulation Profile: No results for input(s): INR, PROTIME in the last 168 hours. CBC: Recent Labs  Lab 06/15/20 1204 06/16/20 0951 06/18/20 0505 06/18/20 1118 06/19/20 0145 06/20/20 0448  WBC 4.6  --  19.5* 18.8* 15.2* 10.0  NEUTROABS 3.1  --  17.3*  --  13.1* 8.5*  HGB 10.3* 9.6* 10.2* 9.8* 9.3* 8.3*  HCT 36.6 33.1* 34.5* 32.5* 30.9* 29.0*  MCV 92.7  --  89.1 87.1 87.3 91.2  PLT 310  --  299 284 291 242   Cardiac Enzymes: No results for input(s): CKTOTAL, CKMB, CKMBINDEX, TROPONINI in the last 168 hours. BNP: Invalid input(s): POCBNP CBG: Recent Labs  Lab 06/19/20 1131 06/20/20 0754 06/20/20 0814 06/20/20 1140 06/20/20 1642  GLUCAP 89 69* 141* 71 85   HbA1C: No results for input(s): HGBA1C in the last 72 hours. Urine analysis:    Component Value Date/Time   COLORURINE AMBER (A) 06/18/2020 1219   APPEARANCEUR CLEAR 06/18/2020 1219   LABSPEC 1.010 06/18/2020 1219   PHURINE 5.0 06/18/2020 1219   GLUCOSEU NEGATIVE 06/18/2020 1219   HGBUR SMALL (A) 06/18/2020 1219   BILIRUBINUR NEGATIVE 06/18/2020 1219   KETONESUR NEGATIVE 06/18/2020 1219   PROTEINUR  NEGATIVE 06/18/2020 1219   NITRITE NEGATIVE 06/18/2020 1219   LEUKOCYTESUR NEGATIVE 06/18/2020 1219   Sepsis Labs: (procalcitonin:4,lacticidven:4) ) Recent Results (from the past 240 hour(s))  Blood culture (routine x 2)     Status: None   Collection Time: 06/12/20  3:05 PM   Specimen: BLOOD LEFT HAND  Result Value Ref Range Status   Specimen Description BLOOD LEFT HAND  Final   Special Requests   Final    Blood Culture adequate volume BOTTLES DRAWN AEROBIC AND ANAEROBIC   Culture   Final    NO GROWTH 5 DAYS Performed at [facility], [address], [place], [place] [address]    Report Status 06/17/2020 FINAL  Final  Blood culture (routine x 2)     Status: None   Collection Time: 06/12/20  3:05 PM   Specimen: BLOOD RIGHT HAND  Result Value Ref Range Status   Specimen Description BLOOD RIGHT HAND  Final   Special Requests   Final    Blood Culture results may not be optimal due to an inadequate volume of blood received in culture bottles BOTTLES DRAWN AEROBIC AND ANAEROBIC   Culture   Final    NO GROWTH 5 DAYS Performed at [facility], [address], [place], [place] [address]    Report Status 06/17/2020 FINAL  Final  Resp Panel by RT-PCR (Flu A&B, Covid) Nasopharyngeal Swab     Status: None   Collection Time: 06/12/20  3:51 PM   Specimen: Nasopharyngeal Swab; Nasopharyngeal(NP) swabs in vial transport medium  Result Value Ref Range Status   SARS Coronavirus 2 by RT PCR NEGATIVE NEGATIVE Final    Comment: (NOTE) SARS-CoV-2  target nucleic acids are NOT DETECTED.  The SARS-CoV-2 RNA is generally detectable in upper respiratory specimens during the acute phase of infection. The lowest concentration of SARS-CoV-2 viral copies this assay can detect is 138 copies/mL. A negative result does not preclude SARS-Cov-2 infection and should not be used as the sole basis for treatment or other patient management decisions. A negative result may occur with  improper specimen  collection/handling, submission of specimen other than nasopharyngeal swab, presence of viral mutation(s) within the areas targeted by this assay, and inadequate number of viral copies(<138 copies/mL). A negative result must be combined with clinical observations, patient history, and epidemiological information. The expected result is Negative.  Fact Sheet for Patients:  BloggerCourse.com  Fact Sheet for Healthcare Providers:  SeriousBroker.it  This test is no t yet approved or cleared by the [place] FDA and  has been authorized for detection and/or diagnosis of SARS-CoV-2 by FDA under an Emergency Use Authorization (EUA). This EUA will remain  in effect (meaning this test can be used) for the duration of the COVID-19 declaration under Section 564(b)(1) of the Act, 21 U.S.C.section 360bbb-3(b)(1), unless the authorization is terminated  or revoked sooner.       Influenza A by PCR NEGATIVE NEGATIVE Final   Influenza B by PCR NEGATIVE NEGATIVE Final    Comment: (NOTE) The Xpert Xpress SARS-CoV-2/FLU/RSV plus assay is intended as an aid in the diagnosis of influenza from Nasopharyngeal swab specimens and should not be used as a sole basis for treatment. Nasal washings and aspirates are unacceptable for Xpert Xpress SARS-CoV-2/FLU/RSV testing.  Fact Sheet for Patients: BloggerCourse.com  Fact Sheet for Healthcare Providers: SeriousBroker.it  This test is not yet approved or cleared by the [place] FDA and has been authorized for detection and/or diagnosis of SARS-CoV-2 by FDA under an Emergency Use Authorization (EUA). This EUA will remain in effect (meaning this test can be used) for the duration of the COVID-19 declaration under Section 564(b)(1) of the Act, 21 U.S.C. section 360bbb-3(b)(1), unless the authorization is terminated or revoked.  Performed at [facility], [address] [place], [place] [address]   Culture, blood (Routine X 2) w Reflex to ID Panel     Status: None (Preliminary result)   Collection Time: 06/18/20 11:18 AM   Specimen: BLOOD RIGHT HAND  Result Value Ref Range Status   Specimen Description BLOOD RIGHT HAND  Final   Special Requests   Final    Blood Culture results may not be optimal due to an inadequate volume of blood received in culture bottles BOTTLES DRAWN AEROBIC AND ANAEROBIC   Culture   Final    NO GROWTH 2 DAYS Performed at [facility], [address] [place], [place] [address]    Report Status PENDING  Incomplete  Culture, blood (Routine X 2) w Reflex to ID Panel     Status: None (Preliminary result)   Collection Time: 06/18/20 11:18 AM   Specimen: BLOOD RIGHT WRIST  Result Value Ref Range Status   Specimen Description BLOOD RIGHT WRIST  Final   Special Requests   Final    Blood Culture results may not be optimal due to an inadequate volume of blood received in culture bottles BOTTLES DRAWN AEROBIC AND ANAEROBIC   Culture   Final    NO GROWTH 2 DAYS Performed at [facility], [address] [place], [place] [address]    Report Status PENDING  Incomplete  Culture, Urine     Status: Abnormal  Collection Time: 06/18/20 12:20 PM   Specimen: Urine, Clean Catch  Result Value Ref Range Status   Specimen Description   Final    URINE, CLEAN CATCH Performed at [facility], [address] [place], [place] [address]    Special Requests   Final    NONE Performed at [facility], [address], [place], [place] [address]    Culture (A)  Final    <10,000 COLONIES/mL INSIGNIFICANT GROWTH Performed at [facility], [address] [place], [place] [address]    Report Status 06/20/2020 FINAL  Final  C Difficile Quick Screen (NO PCR Reflex)     Status: Abnormal   Collection Time: 06/19/20  5:17 PM   Specimen: STOOL  Result Value Ref Range Status   C Diff antigen POSITIVE (A) NEGATIVE Final   C Diff  toxin NEGATIVE NEGATIVE Final   C Diff interpretation   Final    Results are indeterminate. Please contact the provider listed for your campus for C diff questions in AMION.    Comment: Performed at [facility], [address], [place], [place] [address]     Scheduled Meds: . amLODipine  5 mg Oral Daily  . atorvastatin  20 mg Oral QHS  . budesonide (PULMICORT) nebulizer solution  0.5 mg Nebulization BID  . Chlorhexidine Gluconate Cloth  6 each Topical Daily  . ipratropium-albuterol  3 mL Nebulization TID  . metoprolol tartrate  2.5 mg Intravenous Q6H  . pantoprazole  40 mg Intravenous Q12H  . sodium chloride flush  3 mL Intravenous Q12H  . sucralfate  1 g Oral TID with meals   Continuous Infusions: . sodium chloride Stopped (06/18/20 1900)  . ceFEPime (MAXIPIME) IV 2 g (06/20/20 1154)  . metronidazole 500 mg (06/21/20 0802)  . norepinephrine (LEVOPHED) Adult infusion Stopped (06/19/20 1905)    Procedures/Studies: CT ABDOMEN PELVIS WO CONTRAST  Result Date: 06/19/2020 CLINICAL DATA:  Follow-up abnormal chest radiograph. Shortness of breath for 1 week. EXAM: CT CHEST, ABDOMEN AND PELVIS WITHOUT CONTRAST TECHNIQUE: Multidetector CT imaging of the chest, abdomen and pelvis was performed following the standard protocol without IV contrast. COMPARISON:  CT AP 01/30/2020 and CTA chest 06/12/2020 FINDINGS: CT CHEST FINDINGS Cardiovascular: Heart size appears mildly enlarged. Aortic atherosclerosis. Coronary artery calcifications. No pericardial effusion. Mediastinum/Nodes: No mediastinal, supraclavicular or axillary adenopathy. There is an apparent small air-filled tract arising off the left lateral wall of the esophagus at the level of the transverse aortic arch chest just proximal to the left mainstem bronchus. This appears to contain a small volume of gas and debris, image 16/2. No significant pneumo mediastinum identified. This finding may reflect collapsed esophagus with lateral deviation  versus esophageal perforation status post EGD. The trachea appears patent and is midline. Normal appearance of the thyroid gland. Lungs/Pleura: Small bilateral pleural effusions are identified, new from previous exam. Subsegmental atelectasis is noted within the posteromedial right lower lobe, lingula and left lower lobe. No pneumothorax identified. Mild changes of centrilobular emphysema with diffuse bronchial wall thickening. Musculoskeletal: Multiple bilateral rib deformities are identified, likely posttraumatic. Unchanged. 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 Similar appearance of vertebra plana deformity at the T9 level. CT ABDOMEN PELVIS FINDINGS Hepatobiliary: No focal liver abnormality. Gallbladder appears diffusely distended. No signs of gallbladder wall thickening or inflammation. No bile duct dilatation Pancreas: Unremarkable. No pancreatic ductal dilatation or surrounding inflammatory changes. Spleen: Normal in size without focal abnormality. Adrenals/Urinary Tract: Normal appearance of the adrenal  glands. No kidney mass or hydronephrosis. Urinary bladder is unremarkable. Stomach/Bowel: Hiatal hernia. The appendix is not confidently identified separate from the right lower quadrant bowel loops. No small bowel wall thickening, inflammation, or distension. A rectal tube is in place. Within the limitations of unenhanced technique there is wall thickening involving the distal sigmoid colon and rectum with diffuse perirectal soft tissue stranding and edema. This appears new from 01/30/2020. Vascular/Lymphatic: Aortic atherosclerosis. No aneurysm. No abdominopelvic adenopathy identified. Reproductive: Status post hysterectomy. No adnexal masses. Other: No focal fluid collections identified. No signs of pneumoperitoneum. Midline ventral abdominal wall hernia is identified which contains fat only. Musculoskeletal: Previous L5-S1 PLIF with posterior translation of the  interbody fusion cage with compression upon the spinal canal. This is unchanged from 01/30/2020. Grade 2 anterolisthesis is identified at this level, unchanged. Chronic fracture deformities involving the right superior and inferior pubic rami noted. IMPRESSION: 1. There is apparent air-filled tract arising off the left lateral wall of the esophagus at the level of the transverse aortic arch and left mainstem bronchus. Although this may reflect lateral deviation of collapsed esophagus perforation cannot be excluded. If there is a clinical concern for esophageal perforation further evaluation with water-soluble esophagram or repeat CT of the chest following ingestion of water-soluble contrast material is advised. 2. Small bilateral pleural effusions with multifocal areas of lower lung zone subsegmental atelectasis 3. Wall thickening involving the rectosigmoid colon is identified with diffuse perirectal soft tissue stranding/edema. Correlate for any clinical signs or symptoms of inflammatory or proctitis. 4. Aortic Atherosclerosis (ICD10-I70.0) and Emphysema (ICD10-J43.9). 5. Coronary artery calcifications. 6. 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 Chronic bilateral rib fractures and chronic fractures involving the superior and inferior right pubic rami noted. Electronically Signed   By: Signa Kell M.D.   On: 06/19/2020 12:12   DG Chest 1 View  Result Date: 06/12/2020 CLINICAL DATA:  Shortness of breath EXAM: CHEST  1 VIEW COMPARISON:  January 15, 2020 FINDINGS: There is ill-defined airspace opacity in the right upper lobe. There is scarring in the left base. The heart is enlarged with pulmonary vascularity normal, stable. There is aortic atherosclerosis. Bones are osteoporotic. There is extensive arthropathy in the right shoulder. IMPRESSION: Ill-defined airspace opacity consistent with pneumonia right upper lobe. Scarring left base. Stable cardiac enlargement. Bones osteoporotic. Aortic  Atherosclerosis (ICD10-I70.0). Electronically Signed   By: Bretta Bang III M.D.   On: 06/12/2020 14:47   CT HEAD WO CONTRAST  Result Date: 06/18/2020 CLINICAL DATA:  Inpatient. Mental status change after EGD 06/17/2020. Restless. No reported injury. EXAM: CT HEAD WITHOUT CONTRAST TECHNIQUE: Contiguous axial images were obtained from the base of the skull through the vertex without intravenous contrast. COMPARISON:  None. FINDINGS: Brain: Small focus of cortical hypodensity in right occipital lobe (series 2/image 11). Nonspecific moderate subcortical and periventricular white matter hypodensity, most in keeping with chronic small vessel ischemic change. No evidence of parenchymal hemorrhage or extra-axial fluid collection. No mass lesion, mass effect, or midline shift. Cerebral volume is age appropriate. No ventriculomegaly. Vascular: No acute abnormality. Skull: No evidence of calvarial fracture. Sinuses/Orbits: The visualized paranasal sinuses are essentially clear. Other:  The mastoid air cells are unopacified. IMPRESSION: 1. Small focus of cortical hypodensity in the right occipital lobe, cannot exclude acute/subacute infarct. No acute intracranial hemorrhage. MRI brain without and with IV contrast could be considered for further evaluation as clinically warranted. 2. Moderate chronic small vessel ischemic changes in the cerebral white matter. These results will be  called to the ordering clinician or representative by the Radiologist Assistant, and communication documented in the PACS or [employer] Dashboard. Electronically Signed   By: Delbert PhenixJason A Poff M.D.   On: 06/18/2020 17:53   CT CHEST WO CONTRAST  Result Date: 06/19/2020 CLINICAL DATA:  Follow-up abnormal chest radiograph. Shortness of breath for 1 week. EXAM: CT CHEST, ABDOMEN AND PELVIS WITHOUT CONTRAST TECHNIQUE: Multidetector CT imaging of the chest, abdomen and pelvis was performed following the standard protocol without IV contrast. COMPARISON:   CT AP 01/30/2020 and CTA chest 06/12/2020 FINDINGS: CT CHEST FINDINGS Cardiovascular: Heart size appears mildly enlarged. Aortic atherosclerosis. Coronary artery calcifications. No pericardial effusion. Mediastinum/Nodes: No mediastinal, supraclavicular or axillary adenopathy. There is an apparent small air-filled tract arising off the left lateral wall of the esophagus at the level of the transverse aortic arch chest just proximal to the left mainstem bronchus. This appears to contain a small volume of gas and debris, image 16/2. No significant pneumo mediastinum identified. This finding may reflect collapsed esophagus with lateral deviation versus esophageal perforation status post EGD. The trachea appears patent and is midline. Normal appearance of the thyroid gland. Lungs/Pleura: Small bilateral pleural effusions are identified, new from previous exam. Subsegmental atelectasis is noted within the posteromedial right lower lobe, lingula and left lower lobe. No pneumothorax identified. Mild changes of centrilobular emphysema with diffuse bronchial wall thickening. Musculoskeletal: Multiple bilateral rib deformities are identified, likely posttraumatic. Unchanged. 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 Similar appearance of vertebra plana deformity at the T9 level. CT ABDOMEN PELVIS FINDINGS Hepatobiliary: No focal liver abnormality. Gallbladder appears diffusely distended. No signs of gallbladder wall thickening or inflammation. No bile duct dilatation Pancreas: Unremarkable. No pancreatic ductal dilatation or surrounding inflammatory changes. Spleen: Normal in size without focal abnormality. Adrenals/Urinary Tract: Normal appearance of the adrenal glands. No kidney mass or hydronephrosis. Urinary bladder is unremarkable. Stomach/Bowel: Hiatal hernia. The appendix is not confidently identified separate from the right lower quadrant bowel loops. No small bowel wall thickening,  inflammation, or distension. A rectal tube is in place. Within the limitations of unenhanced technique there is wall thickening involving the distal sigmoid colon and rectum with diffuse perirectal soft tissue stranding and edema. This appears new from 01/30/2020. Vascular/Lymphatic: Aortic atherosclerosis. No aneurysm. No abdominopelvic adenopathy identified. Reproductive: Status post hysterectomy. No adnexal masses. Other: No focal fluid collections identified. No signs of pneumoperitoneum. Midline ventral abdominal wall hernia is identified which contains fat only. Musculoskeletal: Previous L5-S1 PLIF with posterior translation of the interbody fusion cage with compression upon the spinal canal. This is unchanged from 01/30/2020. Grade 2 anterolisthesis is identified at this level, unchanged. Chronic fracture deformities involving the right superior and inferior pubic rami noted. IMPRESSION: 1. There is apparent air-filled tract arising off the left lateral wall of the esophagus at the level of the transverse aortic arch and left mainstem bronchus. Although this may reflect lateral deviation of collapsed esophagus perforation cannot be excluded. If there is a clinical concern for esophageal perforation further evaluation with water-soluble esophagram or repeat CT of the chest following ingestion of water-soluble contrast material is advised. 2. Small bilateral pleural effusions with multifocal areas of lower lung zone subsegmental atelectasis 3. Wall thickening involving the rectosigmoid colon is identified with diffuse perirectal soft tissue stranding/edema. Correlate for any clinical signs or symptoms of inflammatory or proctitis. 4. Aortic Atherosclerosis (ICD10-I70.0) and Emphysema (ICD10-J43.9). 5. Coronary artery calcifications. 6. 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 Chronic bilateral  rib fractures and chronic fractures involving the superior and inferior right pubic rami noted.  Electronically Signed   By: Signa Kell M.D.   On: 06/19/2020 12:12   CT CHEST W CONTRAST  Result Date: 06/20/2020 CLINICAL DATA:  EGD yesterday. Unenhanced CT at that time demonstrated what is likely a diverticulum involving the distal esophagus. The CT examination is repeated with IV and oral contrast today. EXAM: CT CHEST WITH CONTRAST TECHNIQUE: Multidetector CT imaging of the chest was performed during intravenous contrast administration. Oral contrast was also administered in order to evaluate the esophagus. CONTRAST:  75mL OMNIPAQUE IOHEXOL 300 MG/ML IV. COMPARISON:  Unenhanced CT chest yesterday. FINDINGS: Beam hardening streak artifact related to the fact that the patient was unable to raise the arms makes the examination less than optimal. Cardiovascular: Heart moderately enlarged. Mitral annular and aortic annular calcifications. Severe three-vessel coronary atherosclerosis. No pericardial effusion. Severe atherosclerosis involving the thoracic and upper abdominal aorta without evidence of aneurysm. Mediastinum/Nodes: Oral contrast within the esophagus. Fluid-filled structure measuring approximately 2 cm adjacent to the distal esophagus; there is no oral contrast within this fluid-filled structure. Remainder of the esophagus mildly distended but otherwise normal in appearance. No pathologic lymphadenopathy. Multiple small thyroid nodules in an upper normal sized to slightly enlarged gland, the largest measuring approximately 8 mm. Lungs/Pleura: Atelectasis involving the lower lobes bilaterally. More confluent airspace consolidation is present deep in the RIGHT LOWER LOBE. Mild ground-glass airspace opacity in the lingula. Lungs otherwise clear. No parenchymal nodules or masses. No pleural effusions. Central airways patent without significant bronchial wall thickening. Upper Abdomen: Chronic elevation RIGHT hemidiaphragm. 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 No  acute findings. Musculoskeletal: Remote severe compression fracture of T10 with an associated kyphous deformity. Lucent lesion involving the body of the sternum with sclerotic margins and extraosseous extension and/or pathologic fracture. Severe degenerative changes in the shoulder joints. IMPRESSION: 1. 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 There is no oral contrast within this structure. 2. Atelectasis involving the lower lobes bilaterally. More confluent airspace consolidation deep in the RIGHT LOWER LOBE, query superimposed pneumonia. Mild ground-glass airspace opacity in the lingula is also felt to be infectious or inflammatory. 3. Lucent lesion involving the body of the sternum with extraosseous extension and/or pathologic fracture. 4. Moderate cardiomegaly. Severe three-vessel coronary atherosclerosis. 5. Multinodular goiter. The largest nodule is 8 mm. Not clinically significant; no follow-up imaging recommended. (Ref: J Am Coll Radiol. 2015 Feb;12(2): 143-50). 6. Aortic Atherosclerosis (ICD10-I70.0). Electronically Signed   By: Hulan Saas M.D.   On: 06/20/2020 14:26   CT Angio Chest PE W and/or Wo Contrast  Result Date: 06/12/2020 CLINICAL DATA:  80 year old female with shortness of breath. EXAM: CT ANGIOGRAPHY CHEST WITH CONTRAST TECHNIQUE: Multidetector CT imaging of the chest was performed using the standard protocol during bolus administration of intravenous contrast. Multiplanar CT image reconstructions and MIPs were obtained to evaluate the vascular anatomy. CONTRAST:  Seventy-five mL Omnipaque 350, intravenous COMPARISON:  01/04/2020 FINDINGS: Cardiovascular: Satisfactory opacification of the pulmonary arteries to the segmental level. No evidence of pulmonary embolism. Similar appearing moderate global cardiomegaly. Mitral annular calcifications. Severe coronary atherosclerotic calcifications. Scattered  atherosclerotic calcifications of the thoracic aorta. No pericardial effusion. Mediastinum/Nodes: No enlarged mediastinal, hilar, or axillary lymph nodes. The esophagus is patulous throughout with a standing column of enteric contents. Thyroid gland and trachea demonstrate no significant findings. Lungs/Pleura: Bibasilar and lingular subsegmental atelectasis. Mild upper lobe  predominant centrilobular emphysema. No suspicious pulmonary nodules. No pleural effusion or pneumothorax. 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 The visualized upper abdomen is otherwise within normal limits. Musculoskeletal: Progressive compression deformity of the T9 vertebral body with associated kyphosis at this level, now nearly vertebra plana morphology. Mild, unchanged retropulsion. 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 Multilevel chronic, bilateral posterolateral nondisplaced rib fractures. Advanced degenerative changes of the glenohumeral joints bilaterally. No acute osseous abnormality. Review of the MIP images confirms the above findings. IMPRESSION: Vascular: 1. No evidence of pulmonary embolism. 2. Unchanged moderate global cardiomegaly. 3. Severe coronary atherosclerotic calcifications. 4.  Aortic Atherosclerosis (ICD10-I70.0). Non-Vascular: 1. Bibasilar and lingular subsegmental atelectasis. 2. Progressive compression deformity of the T9 vertebral body, now essentially vertebra plana. Unchanged mild retropulsion. Marliss Coots, MD [facility] Electronically Signed   By: Marliss Coots MD   On: 06/12/2020 16:45   MR BRAIN WO CONTRAST  Result Date: 06/19/2020 CLINICAL DATA:  Mental status change. EXAM: MRI HEAD WITHOUT CONTRAST TECHNIQUE: Multiplanar, multiecho pulse sequences of the brain and surrounding structures were obtained without intravenous contrast.  COMPARISON:  Head CT June 18, 2020 FINDINGS: The study is partially degraded by motion. Brain: No acute infarction, hemorrhage, hydrocephalus, extra-axial collection or mass lesion. Scattered and confluent foci of T2 hyperintensity are seen within the white matter of the cerebral hemispheres and within the pons, nonspecific, most likely related to chronic small vessel ischemia. 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 Hypodense area described on prior CT corresponds to prominent arachnoid granulation in the right transverse sinus. Prominent vascular susceptibility artifact on the GRE sequence is related to recent administration of ferumoxytol seizure. Vascular: Normal flow voids. Skull and upper cervical spine: Normal marrow signal. Sinuses/Orbits: Negative. Other: None. IMPRESSION: 1. No acute intracranial abnormality. 2. 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 3. Moderate chronic small vessel ischemia. Electronically Signed   By: Baldemar Lenis M.D.   On: 06/19/2020 09:52   US Venous Img Lower  Left (DVT Study)  Result Date: 06/12/2020 CLINICAL DATA:  Lower extremity pain and edema EXAM: LEFT LOWER EXTREMITY VENOUS DUPLEX ULTRASOUND TECHNIQUE: Gray-scale sonography with graded compression, as well as color Doppler and duplex ultrasound were performed to evaluate the left lower extremity deep venous system from the level of the common femoral vein and including the common femoral, femoral, profunda femoral, popliteal and calf veins including the posterior tibial, peroneal and gastrocnemius veins when visible. The superficial great saphenous vein was also interrogated. Spectral Doppler was utilized to evaluate flow at rest and with distal augmentation maneuvers in the common femoral, femoral and popliteal veins. COMPARISON:  None. FINDINGS: Contralateral Common Femoral Vein: Respiratory phasicity is normal and symmetric with the  symptomatic side. No evidence of thrombus. Normal compressibility. Common Femoral Vein: No evidence of thrombus. Normal compressibility, respiratory phasicity and response to augmentation. Saphenofemoral Junction: No evidence of thrombus. Normal compressibility and flow on color Doppler imaging. Profunda Femoral Vein: No evidence of thrombus. Normal compressibility and flow on color Doppler imaging. Femoral Vein: No evidence of thrombus. Normal compressibility, respiratory phasicity and response to augmentation. Popliteal Vein: No evidence of thrombus. Normal compressibility, respiratory phasicity and response to augmentation. Calf Veins: No evidence of thrombus. Normal compressibility and flow on color Doppler imaging. Superficial Great Saphenous Vein: No evidence of thrombus. Normal compressibility. Venous Reflux:  None. Other Findings:  Soft tissue edema noted in the calf region. IMPRESSION: No evidence of deep venous thrombosis in the left lower extremity. Right common femoral vein patent. There is left calf region soft tissue edema. Electronically Signed   By: Bretta Bang III M.D.   On: 06/12/2020 14:46   DG Chest Port 1 View  Result Date: 06/19/2020 CLINICAL DATA:  Central line placement. EXAM: PORTABLE CHEST 1 VIEW COMPARISON:  April 09/30/2020. FINDINGS: Low lung volumes with bibasilar opacities. No visible pleural effusions or pneumothorax on this portable semi rib upright radiograph. Mild enlargement the cardiac silhouette. Calcific atherosclerosis of the aorta. Right IJ central venous catheter with the tip projecting at the superior cavoatrial junction. Polyarticular degenerative change. IMPRESSION: 1. Right IJ central venous catheter with the tip projecting at the superior cavoatrial junction. 2. Low lung volumes with bibasilar opacities that are better characterized on same day CT chest. Electronically Signed   By: Feliberto Harts MD   On: 06/19/2020 11:15   DG CHEST PORT 1 VIEW  Result  Date: 06/18/2020 CLINICAL DATA:  Shortness of breath, hypoxia, history hypertension, smoker EXAM: PORTABLE CHEST 1 VIEW COMPARISON:  Portable exam 1043 hours compared to 06/15/2020 FINDINGS: Upper normal heart size. Mediastinal contours and pulmonary vascularity normal. Atherosclerotic calcification aorta. Bronchitic changes with bibasilar atelectasis. Remaining lungs clear. No pleural effusion or pneumothorax. Bones demineralized with advanced RIGHT glenohumeral degenerative changes. IMPRESSION: Persistent bronchitic changes and bibasilar atelectasis. Aortic Atherosclerosis (ICD10-I70.0). Electronically Signed   By: Ulyses Southward M.D.   On: 06/18/2020 11:13   DG CHEST PORT 1 VIEW  Result Date: 06/15/2020 CLINICAL DATA:  Shortness of breath, hypertension, former smoker EXAM: PORTABLE CHEST 1 VIEW COMPARISON:  Portable exam 0656 hours compared to 06/12/2020 FINDINGS: Enlargement of cardiac silhouette. Mediastinal contours and pulmonary vascularity normal. Atherosclerotic calcification aorta. Bibasilar atelectasis. No acute infiltrate, pleural effusion, or pneumothorax. Bones demineralized with advanced degenerative changes of the RIGHT glenohumeral joint. IMPRESSION: Enlargement of cardiac silhouette with bibasilar atelectasis. Aortic Atherosclerosis (ICD10-I70.0). Electronically Signed   By: Ulyses Southward M.D.   On: 06/15/2020 08:06   DG Abd 2 Views  Result Date: 06/19/2020 CLINICAL DATA:  Abdominal pain and altered mental status. EXAM: ABDOMEN - 2 VIEW COMPARISON:  CT abdomen and pelvis 01/30/2020 FINDINGS: Scattered gas and stool in the colon. No definite small or large bowel dilatation although paucity of gas limits evaluation. No free intra-abdominal air. No abnormal air-fluid levels. No radiopaque stones. There is evidence of infiltration or atelectasis in both lung bases. Postoperative changes in the lumbosacral spine. Degenerative changes in the lumbar spine and hips. IMPRESSION: 1. Nonobstructive bowel gas  pattern. 2. Infiltration or atelectasis in both lung bases. Electronically Signed   By: Burman Nieves M.D.   On: 06/19/2020 02:10   ECHOCARDIOGRAM COMPLETE  Result Date: 06/13/2020    ECHOCARDIOGRAM REPORT   Patient Name:   Brenda Hopkins Date of Exam: 06/13/2020 Medical Rec #:  [id number]           Height:       64.0 in Accession #:    [id number]          Weight:       157.0 lb Date of Birth:  November 09, 1940           BSA:          1.765 m Patient Age:    79 years            BP:  108/55 mmHg Patient Gender: F                   HR:           91 bpm. [facility] Procedure: 2D Echo Indications:    CHF-Acute Diastolic I50.31  History:        Patient has prior history of Echocardiogram examinations, most                 recent 01/16/2020. Risk Factors:Hypertension, Dyslipidemia and                 Former Smoker.  Sonographer:    Jeryl Columbia RDCS (AE) Referring Phys: [id number] [address] [place] IMPRESSIONS  1. Left ventricular ejection fraction, by estimation, is 55 to 60%. The left ventricle has normal function. The left ventricle has no regional wall motion abnormalities. There is mild concentric left ventricular hypertrophy of the basal-septal segment. Left ventricular diastolic parameters are consistent with Grade I diastolic dysfunction (impaired relaxation).  2. Right ventricular systolic function is normal. The right ventricular size is normal. There is mildly elevated pulmonary artery systolic pressure. The estimated right ventricular systolic pressure is 42.0 mmHg.  3. The mitral valve is grossly normal. No evidence of mitral valve regurgitation. No evidence of mitral stenosis. Severe mitral annular calcification.  4. The aortic valve was not well visualized. There is mild calcification of the aortic valve. Aortic valve regurgitation is not visualized. No aortic stenosis is present.  5. The inferior vena cava is dilated in size with <50% respiratory variability, suggesting right atrial  pressure of 15 mmHg. Comparison(s): A prior study was performed on 01/16/2020. Left ventricle is less hyperdynamic. FINDINGS  Left Ventricle: Left ventricular ejection fraction, by estimation, is 55 to 60%. The left ventricle has normal function. The left ventricle has no regional wall motion abnormalities. The left ventricular internal cavity size was normal in size. There is  mild concentric left ventricular hypertrophy of the basal-septal segment. Left ventricular diastolic parameters are consistent with Grade I diastolic dysfunction (impaired relaxation). Right Ventricle: The right ventricular size is normal. No increase in right ventricular wall thickness. Right ventricular systolic function is normal. There is mildly elevated pulmonary artery systolic pressure. The tricuspid regurgitant velocity is 2.60  m/s, and with an assumed right atrial pressure of 15 mmHg, the estimated right ventricular systolic pressure is 42.0 mmHg. Left Atrium: Left atrial size was normal in size. Right Atrium: Right atrial size was normal in size. Pericardium: There is no evidence of pericardial effusion. Mitral Valve: The mitral valve is grossly normal. There is mild thickening of the mitral valve leaflet(s). There is moderate calcification of the mitral valve leaflet(s). Severe mitral annular calcification. No evidence of mitral valve regurgitation. No evidence of mitral valve stenosis. MV peak gradient, 6.7 mmHg. The mean mitral valve gradient is 3.0 mmHg with average heart rate of 98 bpm. Tricuspid Valve: The tricuspid valve is grossly normal. Tricuspid valve regurgitation is trivial. No evidence of tricuspid stenosis. Aortic Valve: The aortic valve was not well visualized. There is mild calcification of the aortic valve. Aortic valve regurgitation is not visualized. No aortic stenosis is present. Pulmonic Valve: The pulmonic valve was not well visualized. Pulmonic valve regurgitation is not visualized. No evidence of pulmonic  stenosis. Aorta: The aortic root is normal in size and structure. Venous: The inferior vena cava is dilated in size with less than 50% respiratory variability, suggesting right atrial pressure of 15 mmHg. IAS/Shunts:  The atrial septum is grossly normal.  LEFT VENTRICLE PLAX 2D LVIDd:         4.18 cm  Diastology LVIDs:         2.99 cm  LV e' medial:    5.00 cm/s LV PW:         1.40 cm  LV E/e' medial:  18.3 LV IVS:        1.35 cm  LV e' lateral:   4.24 cm/s LVOT diam:     2.10 cm  LV E/e' lateral: 21.5 LVOT Area:     3.46 cm  RIGHT VENTRICLE RV S prime:     14.30 cm/s TAPSE (M-mode): 2.4 cm LEFT ATRIUM             Index       RIGHT ATRIUM           Index LA diam:        4.30 cm 2.44 cm/m  RA Area:     14.40 cm LA Vol (A2C):   52.3 ml 29.63 ml/m RA Volume:   35.60 ml  20.17 ml/m LA Vol (A4C):   48.3 ml 27.37 ml/m LA Biplane Vol: 52.1 ml 29.52 ml/m   AORTA Ao Root diam: 2.50 cm MITRAL VALVE                TRICUSPID VALVE MV Area (PHT): 3.36 cm     TR Peak grad:   27.0 mmHg MV Peak grad:  6.7 mmHg     TR Vmax:        260.00 cm/s MV Mean grad:  3.0 mmHg MV Vmax:       1.29 m/s     SHUNTS MV Vmean:      87.5 cm/s    Systemic Diam: 2.10 cm MV Decel Time: 226 msec MV E velocity: 91.30 cm/s MV A velocity: 120.00 cm/s MV E/A ratio:  0.76 Riley Lam MD Electronically signed by Riley Lam MD Signature Date/Time: 06/13/2020/3:04:50 PM    Final    US Abdomen Limited RUQ (LIVER/GB)  Result Date: 06/15/2020 CLINICAL DATA:  Diffuse abdominal pain. 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 EXAM: ULTRASOUND ABDOMEN LIMITED RIGHT UPPER QUADRANT COMPARISON:  Prior CT scan November 2021 FINDINGS: Gallbladder: No gallstones or wall thickening visualized. No sonographic Murphy sign noted by sonographer. Common bile duct: Diameter: Within normal limits at 5-6 mm Liver: No focal lesion identified. Within normal limits in parenchymal echogenicity. Portal vein is patent on color  Doppler imaging with normal direction of blood flow towards the liver. Other: None. IMPRESSION: Negative right upper quadrant ultrasound. Electronically Signed   By: Malachy Moan M.D.   On: 06/15/2020 09:55    Catarina Hartshorn, DO  [facility]  If 7PM-7AM, please contact night-coverage [email or web address] Password [email or web address] 06/21/2020, 9:59 AM   LOS: 9 days

## 2020-06-21 NOTE — Progress Notes (Signed)
error 

## 2020-06-21 NOTE — Progress Notes (Signed)
1915 Upon shift assessment, patient lying bed confused, agitated, irritable, restless. BSW restraints in use. Large liquid BMs. Peri care, foley care provided. Full linen change. Relaxation videos utilized in patient room. Daughter, Servando Snare, at bedside.   44 Daughter requesting to speak with physician in person at this time. This RN explained to daughter that night-time hospitalist is covering entire hospital; that it would be more appropriate for her to speak with attending/consulting physician in the morning to discuss patient condition, plan of care, discharge planning. 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 I would like to see the hospitalist now." Hospitalist Frankey Shown, DO notified.   2010 Ativan 0.5mg  IV administered PRN for agitation, see eMAR.   2018 RN remains at beside providing patient care. Daughter states, "I have to leave now. I am so tired, I have to drive home. Please have the doctor call me as soon as possible at [phone]." Hospitalist Frankey Shown, DO notified.   2042 Hospitalist [place], DO notified of multiple large liquid BMs throughout shift. Orders placed to insert indwelling fecal management system.  2055 Indwelling fecal management system placed per provider order.  2230 Multiple RNs attempting to reorient, reposition patient throughout shift. Patient consistently confused, agitated, irritable, restless. BSW restraints remain in use. Indwelling urinary catheter and indwelling rectal tube removed by patient using feet/toes. Hospitalist Frankey Shown, DO in department, aware. New indwelling urinary catheter inserted. Indwelling rectal tube reinserted.   2235 Haldol 5mg  IV administered PRN for agitation, see eMAR.  2240 Fentanyl 25 mcg IV administered PRN for pain, see eMAR.

## 2020-06-22 DIAGNOSIS — A419 Sepsis, unspecified organism: Secondary | ICD-10-CM

## 2020-06-22 DIAGNOSIS — R6521 Severe sepsis with septic shock: Secondary | ICD-10-CM

## 2020-06-22 DIAGNOSIS — I5033 Acute on chronic diastolic (congestive) heart failure: Secondary | ICD-10-CM | POA: Diagnosis not present

## 2020-06-22 DIAGNOSIS — N179 Acute kidney failure, unspecified: Secondary | ICD-10-CM | POA: Diagnosis not present

## 2020-06-22 DIAGNOSIS — G9341 Metabolic encephalopathy: Secondary | ICD-10-CM | POA: Diagnosis not present

## 2020-06-22 DIAGNOSIS — J9601 Acute respiratory failure with hypoxia: Secondary | ICD-10-CM | POA: Diagnosis not present

## 2020-06-22 LAB — CBC
HCT: 34.3 % — ABNORMAL LOW (ref 36.0–46.0)
Hemoglobin: 9.9 g/dL — ABNORMAL LOW (ref 12.0–15.0)
MCH: 25.9 pg — ABNORMAL LOW (ref 26.0–34.0)
MCHC: 28.9 g/dL — ABNORMAL LOW (ref 30.0–36.0)
MCV: 89.8 fL (ref 80.0–100.0)
Platelets: 349 10*3/uL (ref 150–400)
RBC: 3.82 MIL/uL — ABNORMAL LOW (ref 3.87–5.11)
RDW: 18.2 % — ABNORMAL HIGH (ref 11.5–15.5)
WBC: 9.9 10*3/uL (ref 4.0–10.5)
nRBC: 0 % (ref 0.0–0.2)

## 2020-06-22 LAB — MAGNESIUM: Magnesium: 1.5 mg/dL — ABNORMAL LOW (ref 1.7–2.4)

## 2020-06-22 LAB — COMPREHENSIVE METABOLIC PANEL
ALT: 15 U/L (ref 0–44)
AST: 17 U/L (ref 15–41)
Albumin: 2.6 g/dL — ABNORMAL LOW (ref 3.5–5.0)
Alkaline Phosphatase: 101 U/L (ref 38–126)
Anion gap: 12 (ref 5–15)
BUN: 9 mg/dL (ref 8–23)
CO2: 27 mmol/L (ref 22–32)
Calcium: 8.3 mg/dL — ABNORMAL LOW (ref 8.9–10.3)
Chloride: 102 mmol/L (ref 98–111)
Creatinine, Ser: 0.66 mg/dL (ref 0.44–1.00)
GFR, Estimated: >60 mL/min (ref >60)
Glucose, Bld: 94 mg/dL (ref 70–99)
Potassium: 2.8 mmol/L — ABNORMAL LOW (ref 3.5–5.1)
Sodium: 141 mmol/L (ref 135–145)
Total Bilirubin: 0.7 mg/dL (ref 0.3–1.2)
Total Protein: 6 g/dL — ABNORMAL LOW (ref 6.5–8.1)

## 2020-06-22 LAB — CLOSTRIDIUM DIFFICILE BY PCR, REFLEXED: Toxigenic C. Difficile by PCR: POSITIVE — AB

## 2020-06-22 MED ORDER — POTASSIUM CHLORIDE 10 MEQ/100ML IV SOLN
10.0000 meq | INTRAVENOUS | Status: AC
  Administered 2020-06-22 (×3): 10 meq via INTRAVENOUS
  Filled 2020-06-22: qty 100

## 2020-06-22 MED ORDER — MAGNESIUM SULFATE 2 GM/50ML IV SOLN
2.0000 g | Freq: Once | INTRAVENOUS | Status: AC
  Administered 2020-06-22: 2 g via INTRAVENOUS
  Filled 2020-06-22: qty 50

## 2020-06-22 MED ORDER — METOPROLOL TARTRATE 25 MG PO TABS
25.0000 mg | ORAL_TABLET | Freq: Two times a day (BID) | ORAL | Status: DC
  Administered 2020-06-22 – 2020-06-24 (×5): 25 mg via ORAL
  Filled 2020-06-22 (×5): qty 1

## 2020-06-22 NOTE — Progress Notes (Signed)
Upon morning assessment, patient's flexiseal was out while still fully inflated, therefore it is being ineffective. Left it out, bathed patient and will check for BM's on hourly rounds and even sooner since patient is prone to frequent ones. Will continue to monitor.

## 2020-06-22 NOTE — Progress Notes (Addendum)
Subjective: Unable to provide history. In restraints. Active moving around in bed. Flex-seal rectal tube continues to dislodge, so it has remained out. 1 large watery foul-smelling BM today per nursing staff.   Objective: Vital signs in last 24 hours: Temp:  [98 F (36.7 C)-98.4 F (36.9 C)] 98 F (36.7 C) (04/11 0742) Pulse Rate:  [50-130] 98 (04/11 0926) Resp:  [16-32] 19 (04/11 0900) BP: (118-186)/(54-133) 186/133 (04/11 0926) SpO2:  [94 %-100 %] 97 % (04/11 0900) Weight:  [58.4 kg] 58.4 kg (04/11 0440) Last BM Date: 06/21/20 General:   Chronically ill-appearing, restless Head:  Normocephalic and atraumatic. Abdomen:  Bowel sounds present, soft, non-tender, non-distended. LLQ abdominal wall hernia Extremities:  Without edema. Neurologic:  Unable to assess  Intake/Output from previous day: 04/10 0701 - 04/11 0700 In: 929.9 [P.O.:350; I.V.:279.9; IV Piggyback:300] Out: 1000 [Urine:1000] Intake/Output this shift: No intake/output data recorded.  Lab Results: Recent Labs    06/20/20 0448 06/21/20 1017 06/22/20 0435  WBC 10.0 9.8 9.9  HGB 8.3* 10.1* 9.9*  HCT 29.0* 34.1* 34.3*  PLT 242 311 349   BMET Recent Labs    06/20/20 0448 06/21/20 1017 06/22/20 0435  NA 141 139 141  K 3.4* 3.0* 2.8*  CL 102 101 102  CO2 32 26 27  GLUCOSE 75 153* 94  BUN 18 12 9   CREATININE 0.81 0.79 0.66  CALCIUM 8.0* 8.4* 8.3*   LFT Recent Labs    06/20/20 0448 06/21/20 1017 06/22/20 0435  PROT 4.7* 6.2* 6.0*  ALBUMIN 2.1* 2.8* 2.6*  AST 17 21 17   ALT 14 18 15   ALKPHOS 95 109 101  BILITOT 0.8 0.8 0.7    Studies/Results: CT CHEST W CONTRAST  Result Date: 06/20/2020 CLINICAL DATA:  EGD yesterday. Unenhanced CT at that time demonstrated what is likely a diverticulum involving the distal esophagus. The CT examination is repeated with IV and oral contrast today. EXAM: CT CHEST WITH CONTRAST TECHNIQUE: Multidetector CT imaging of the chest was performed during intravenous  contrast administration. Oral contrast was also administered in order to evaluate the esophagus. CONTRAST:  96mL OMNIPAQUE IOHEXOL 300 MG/ML IV. COMPARISON:  Unenhanced CT chest yesterday. FINDINGS: Beam hardening streak artifact related to the fact that the patient was unable to raise the arms makes the examination less than optimal. Cardiovascular: Heart moderately enlarged. Mitral annular and aortic annular calcifications. Severe three-vessel coronary atherosclerosis. No pericardial effusion. Severe atherosclerosis involving the thoracic and upper abdominal aorta without evidence of aneurysm. Mediastinum/Nodes: Oral contrast within the esophagus. Fluid-filled structure measuring approximately 2 cm adjacent to the distal esophagus; there is no oral contrast within this fluid-filled structure. Remainder of the esophagus mildly distended but otherwise normal in appearance. No pathologic lymphadenopathy. Multiple small thyroid nodules in an upper normal sized to slightly enlarged gland, the largest measuring approximately 8 mm. Lungs/Pleura: Atelectasis involving the lower lobes bilaterally. More confluent airspace consolidation is present deep in the RIGHT LOWER LOBE. Mild ground-glass airspace opacity in the lingula. Lungs otherwise clear. No parenchymal nodules or masses. No pleural effusions. Central airways patent without significant bronchial wall thickening. Upper Abdomen: Chronic elevation RIGHT hemidiaphragm. 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 No acute findings. Musculoskeletal: Remote severe compression fracture of T10 with an associated kyphous deformity. Lucent lesion involving the body of the sternum with sclerotic margins and extraosseous extension and/or pathologic fracture. Severe degenerative changes in the shoulder joints. IMPRESSION: 1. 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 There  is no oral contrast within this structure. 2. Atelectasis involving the lower lobes bilaterally. More confluent airspace consolidation deep in the RIGHT LOWER LOBE, query superimposed pneumonia. Mild ground-glass airspace opacity in the lingula is also felt to be infectious or inflammatory. 3. Lucent lesion involving the body of the sternum with extraosseous extension and/or pathologic fracture. 4. Moderate cardiomegaly. Severe three-vessel coronary atherosclerosis. 5. Multinodular goiter. The largest nodule is 8 mm. Not clinically significant; no follow-up imaging recommended. (Ref: J Am Coll Radiol. 2015 Feb;12(2): 143-50). 6. Aortic Atherosclerosis (ICD10-I70.0). Electronically Signed   By: Hulan Saas M.D.   On: 06/20/2020 14:26    Assessment: 80 year old female with history of Roux-en-Y gastric bypass, anastomotic ulcer on EGD November 2021, presenting with acute on chronic heart failure, GI consulted for epigastric pain, nausea and vomiting. 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 Notably, she had been taking Celebrex since February.   GI bleeding: resolved. Concern for lower GI bleeding last week but now resolved. Hgb remaining stable. No transfusion needed this admission.   Acute metabolic encephalopathy: onset 4/7. CT brain and MRI brain on file this admission without acute findings. Agitated today at bedside. In restraints.   Epigastric pain: EGD findings as above. CT abdomen/pelvis without contrast on 4/8 and esophageal perforation ruled out with CT chest on 4/9. Wall thickening of rectosigmoid colon with diffuse perirectal soft tissue stranding/edema.   Diarrhea: Cdiff antigen positive, toxin negative, PCR positive today. Clinically managing as Cdiff, as she is symptomatic with diarrhea and has had multiple antibiotic exposures. Vancomycin started 4/10.     Plan: Follow CBC PPI BID: although treating for Cdiff, benefits outweigh risks in light of known PUD Discontinue Imodium due to CDI Vancomycin 125 mg po QID X 10 days Appreciate palliative involvement Dysphagia 1 diet Judicious use of other antibiotics  Gelene Mink, PhD, ANP-BC [facility]    LOS: 10 days    06/22/2020, 10:10 AM

## 2020-06-22 NOTE — Progress Notes (Signed)
Pt removed urinary catheter herself by her foot with the balloon still intact. Pt stated it didn't hurt when she pulled it out. MD made aware, stated just to leave the catheter out and monitor when she urinates.

## 2020-06-22 NOTE — Progress Notes (Signed)
PROGRESS NOTE  Marelin  ZOX:[id number] DOB: [birth year] DOA: 06/12/2020 PCP: Shawnie Dapper, PA-C Brief History: 80 y.o.female,with medical history significant forhypertension, anxiety, marginalulcer, tobacco abuseandchronic paindue to complaints of shortness of breathof one week duration. Shereports dyspneawith exertion, and laying flat, as well she does report some epigastric/lower chest pain, does not radiate, as well she does report worsening lower extremity edema.she was recently seen by her PCP for lower extremity edema which she was started on Lasix for last 4 days, report was told to have A. fib, and referral has been made to cardiologist but she did not get to see themprior to admission. She has a significant tobacco hx of >50 pack years. In the ED,BNP of 1270, chest x-ray significant for vascular congestion, EKG nonacute, troponins non-ACS pattern 71>63,was given IV Lasix, and [facility] hospitalist consulted to admit for CHF On 06/18/20, the patient was moved to ICU due to concerns of hematochezia and low BP. She became more encephalopathic which was a change from her prior mental status. Work up for AMS was undertaken. It was felt this was primarily driven by new septic like clinical picture. The patient was started on empiric vanc and cefepime initially. She remained hypotensive despite fluid resuscitation and levophed was started. GOC was discussed with patient's daughter and patient was changed to DNR. Palliative medicine was consulted.  Fortunately, the patient stabilized with IV fluids, IV abx and vasopressor support. Her levophed was weaned off 06/19/20.  Assessment/Plan: Acute metabolic Encephalopathy -06/18/20--developed acute encephalopathy -06/22/20-more awake and conversant, intermittently agitated, pleasantly confused otherwise -4/7/22ABG 7.461/48/56/33 (0.36) -due to sepsis, AKI, hypoxia -EKG-sinus, nonspecific ST  changes -ammonia--23 -B12--205 -check PCT5.85>>7.15 -d/c gabapentin, seroquel. Requip, oxycodoone -CT brain--Small focus of cortical hypodensity in the right occipital lobe -MR brain--no acute findings  -minimize opioids/hypnotic meds  Acute on chronic diastolic CHF -BNP 1270 >>>589, 282, 198 -NEG 11L since admissionprior to transfer to ICU 4/7 -Continue IV Lasix40 mg daily>>po lasix 06/17/20--now on hold due to hypotension/sepsis -Monitoring I's and O's, daily weight -2D echo--ejection fraction 55-60%,G1DD, no WMA, mild elevated PASP -appreciateconsulting cardiology -CTAchest--negpulmonary embolis;cardiomegaly, severe coronary atherosclerosis, aortic atherosclerosis,.. Bilateral sublingual atelectasis, T9 vertebral compression deformity -restart lasix if renal function stable  Acute respiratory failure with hypoxia  -due to CHF in the setting of underlying COPD -increased to 4L am 06/18/20>>3L>>2L -CXR--personally reviewed--increase RLL atelectasis, ?LLL opacity -06/18/20 ABG7.461/48/56/33 (0.36) -wean for saturation >90% -continue bronchodilators  Hematochezia/Colitis -GI notified, following -developed evening 06/17/20 -06/17/20 EGD--s/p Roux-en-Y with ulcer(decreased in size)and stenosis at GJ anastomosis -Hgb 10.2 in am 06/18/20>>9.3 -continue protonix drip>>IV protonix bid -4/8 CT abd--Wall thickening involving the rectosigmoid colon is identified with diffuse perirectal soft tissue stranding/edema -continue cefepime and metronidazole -C diff assay-->colonization? -follow stool pathogen panel--NEG -started on empiric vanco 4/10 by GI  ??Esophageal tear-->ruled out -4/8 CT chest--air-filled tract arising off the left lateral wall of the esophagus at the level of the transverse aortic arch and left mainstem bronchus. Although this may reflect lateral deviation of collapsed esophagus perforation cannot be excluded -06/20/20 CT chest with contrast--no perforation of  esophagus.  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 Mild ground-glass airspace opacity in the lingula is also felt to be infectious or inflammatory  Septic shock -check PCT5.85>>7.15>>2.43 -lactate 1.2 -due to pneumonia and intraabdominal source  -4/7 blood culture--neg to date -4/7 UA--no pyuria -continueempiric cefepime metronidazole -4/9 CT chest--Narrow necked diverticulum arising from  the distal esophagus versus mediastinal cyst involving the lower mediastinum.  No leak.  More confluent airspace consolidation deep in the RIGHT LOWER LOBE, query superimposed pneumonia. Mild ground-glass airspace opacity in the lingula is also felt to be infectious or inflammatory. -CT chest/abd--as above -start levophed>>weaned off 4/8  Lobar Pneumonia -06/20/20 CT chest as above -continue cefepime/metronidazole -stable on 3L>>2L  AKI -baseline creatinine 0.6-0.9 -peaking at 1.74>>1.68>>0.81 -due to hypotension, volume depletion -improved  Questionable Atrial Fibrillation -No definitive atrial fibrillation noted on telemetry-->personally reviewed--sinus with PACs -appreciate cardiology input -One strip labeled as 10 beats NSVT overnight but appears more consistent with narrow-complex tachycardia. K+was5.7 on 4/5/22and Kayexalate has been administered. Mg stable at 2.1. -personally reviewed EKG--sinus, PACs, nonspecific T wave changes  Coronary Calcification on CT -has intermittent CP, reproducible, partly due to GI etiology also -troponins flat -outpt stress per cardiology -no anginal symptoms  Epigastricandchest pain -06/17/20 EGD--s/p Roux-en-Y with ulcer and stenosis at GJ anastomosis -Post prandial with every meal developed gastric pain then followed with nausea vomiting -ContinueProtonix, Carafate -Consulting gastroenterology >>>appreciate input -Ultrasound right upper quadrant  negative for cholelithiasis or cholecystitis  Iron deficiency Anemia -iron saturation 7 -ferritin 12 -feraheme x 1on 06/17/20  Hyperlipidemia -continue statin  HTN -holdingamlodipine>>restarted  Chronic pain syndrome -continue gabapentin -continue home dose oxycodone -PMDP reviewed  Hyperkalemia -improved with kayexalate  Hypertension -increase IV lopressor to q6 hours -restarted amlodipine      Status is: Inpatient  Remains inpatient appropriate because:IV treatments appropriate due to intensity of illness or inability to take PO   Dispo: The patient is from:Home Anticipated d/c is HY:IFOY Patient currently is not medically stable to d/c. Difficult to place patient No    The patient is critically ill with multiple organ systems failure and requires high complexity decision making for assessment and support, frequent evaluation and titration of therapies, application of advanced monitoring technologies and extensive interpretation of multiple databases.  Critical care time - 35 mins.     Family Communication:son updated 06/20/20  Consultants:cardiology  Code Status: DNR  DVT Prophylaxis: SCDs   Procedures: As Listed in Progress Note Above  Antibiotics: Cefepime 4/7>> vanc 4/7 x 1 Metronidazole 4/8>> vanco po 4/10>>    Subjective: Patient is awake, pleasantly confused.  She is intermittently agitated.  No vomiting.  Having diarrhea.  Denies abd pain, cp., sob  Objective: Vitals:   06/22/20 0600 06/22/20 0739 06/22/20 0742 06/22/20 0800  BP: (!) 148/55   (!) 148/104  Pulse: 68 94  94  Resp: 20 16  20   Temp:   98 F (36.7 C)   TempSrc:   Axillary   SpO2: 99% 97% 97% 96%  Weight:      Height:        Intake/Output Summary (Last 24 hours) at 06/22/2020 0841 Last data filed at 06/22/2020 0420 Gross per 24 hour  Intake 929.88 ml  Output 1000 ml  Net -70.12 ml    Weight change: -1.2 kg Exam:   General:  Pt is alert, follows commands appropriately, not in acute distress  HEENT: No icterus, No thrush, No neck mass, [place]/AT  Cardiovascular: RRR, S1/S2, no rubs, no gallops  Respiratory: bibasilar rales. No wheeze  Abdomen: Soft/+BS, non tender, non distended, no guarding  Extremities: No edema, No lymphangitis, No petechiae, No rashes, no synovitis   Data Reviewed: I have personally reviewed following labs and imaging studies Basic Metabolic Panel: Recent Labs  Lab 06/16/20 0454 06/16/20 1344 06/18/20 0505 06/19/20 0145 06/20/20 0448 06/21/20 1017  06/22/20 0435  NA 135   < > 131* 135 141 139 141  K 5.7*   < > 5.2* 4.0 3.4* 3.0* 2.8*  CL 89*   < > 87* 94* 102 101 102  CO2 36*   < > 30 31 32 26 27  GLUCOSE 94   < > 182* 95 75 153* 94  BUN 19   < > 27* 30* 18 12 9   CREATININE 1.05*   < > 1.74* 1.68* 0.81 0.79 0.66  CALCIUM 8.7*   < > 8.7* 8.1* 8.0* 8.4* 8.3*  MG 2.1  --   --   --  1.8 1.5* 1.5*   < > = values in this interval not displayed.   Liver Function Tests: Recent Labs  Lab 06/18/20 0505 06/19/20 0145 06/20/20 0448 06/21/20 1017 06/22/20 0435  AST 23 26 17 21 17   ALT 15 17 14 18 15   ALKPHOS 157* 113 95 109 101  BILITOT 1.3* 1.0 0.8 0.8 0.7  PROT 5.3* 5.3* 4.7* 6.2* 6.0*  ALBUMIN 2.7* 2.4* 2.1* 2.8* 2.6*   Recent Labs  Lab 06/19/20 0145  LIPASE 19  AMYLASE 72   Recent Labs  Lab 06/18/20 1118  AMMONIA 23   Coagulation Profile: No results for input(s): INR, PROTIME in the last 168 hours. CBC: Recent Labs  Lab 06/15/20 1204 06/16/20 0951 06/18/20 0505 06/18/20 1118 06/19/20 0145 06/20/20 0448 06/21/20 1017 06/22/20 0435  WBC 4.6  --  19.5* 18.8* 15.2* 10.0 9.8 9.9  NEUTROABS 3.1  --  17.3*  --  13.1* 8.5*  --   --   HGB 10.3*   < > 10.2* 9.8* 9.3* 8.3* 10.1* 9.9*  HCT 36.6   < > 34.5* 32.5* 30.9* 29.0* 34.1* 34.3*  MCV 92.7  --  89.1 87.1 87.3 91.2 89.7 89.8  PLT 310  --  299 284 291 242 311  349   < > = values in this interval not displayed.   Cardiac Enzymes: No results for input(s): CKTOTAL, CKMB, CKMBINDEX, TROPONINI in the last 168 hours. BNP: Invalid input(s): POCBNP CBG: Recent Labs  Lab 06/19/20 1131 06/20/20 0754 06/20/20 0814 06/20/20 1140 06/20/20 1642  GLUCAP 89 69* 141* 71 85   HbA1C: No results for input(s): HGBA1C in the last 72 hours. Urine analysis:    Component Value Date/Time   COLORURINE AMBER (A) 06/18/2020 1219   APPEARANCEUR CLEAR 06/18/2020 1219   LABSPEC 1.010 06/18/2020 1219   PHURINE 5.0 06/18/2020 1219   GLUCOSEU NEGATIVE 06/18/2020 1219   HGBUR SMALL (A) 06/18/2020 1219   BILIRUBINUR NEGATIVE 06/18/2020 1219   KETONESUR NEGATIVE 06/18/2020 1219   PROTEINUR NEGATIVE 06/18/2020 1219   NITRITE NEGATIVE 06/18/2020 1219   LEUKOCYTESUR NEGATIVE 06/18/2020 1219   Sepsis Labs: @LABRCNTIP (procalcitonin:4,lacticidven:4) ) Recent Results (from the past 240 hour(s))  Blood culture (routine x 2)     Status: None   Collection Time: 06/12/20  3:05 PM   Specimen: BLOOD LEFT HAND  Result Value Ref Range Status   Specimen Description BLOOD LEFT HAND  Final   Special Requests   Final    Blood Culture adequate volume BOTTLES DRAWN AEROBIC AND ANAEROBIC   Culture   Final    NO GROWTH 5 DAYS Performed at [facility], [address] [place], [place] [address]    Report Status 06/17/2020 FINAL  Final  Blood culture (routine x 2)     Status: None   Collection Time: 06/12/20  3:05 PM   Specimen: BLOOD  RIGHT HAND  Result Value Ref Range Status   Specimen Description BLOOD RIGHT HAND  Final   Special Requests   Final    Blood Culture results may not be optimal due to an inadequate volume of blood received in culture bottles BOTTLES DRAWN AEROBIC AND ANAEROBIC   Culture   Final    NO GROWTH 5 DAYS Performed at [facility], [address], [place], [place] [address]    Report Status 06/17/2020 FINAL  Final  Resp Panel by RT-PCR (Flu A&B,  Covid) Nasopharyngeal Swab     Status: None   Collection Time: 06/12/20  3:51 PM   Specimen: Nasopharyngeal Swab; Nasopharyngeal(NP) swabs in vial transport medium  Result Value Ref Range Status   SARS Coronavirus 2 by RT PCR NEGATIVE NEGATIVE Final    Comment: (NOTE) SARS-CoV-2 target nucleic acids are NOT DETECTED.  The SARS-CoV-2 RNA is generally detectable in upper respiratory specimens during the acute phase of infection. The lowest concentration of SARS-CoV-2 viral copies this assay can detect is 138 copies/mL. A negative result does not preclude SARS-Cov-2 infection and should not be used as the sole basis for treatment or other patient management decisions. A negative result may occur with  improper specimen collection/handling, submission of specimen other than nasopharyngeal swab, presence of viral mutation(s) within the areas targeted by this assay, and inadequate number of viral copies(<138 copies/mL). A negative result must be combined with clinical observations, patient history, and epidemiological information. The expected result is Negative.  Fact Sheet for Patients:  BloggerCourse.com  Fact Sheet for Healthcare Providers:  SeriousBroker.it  This test is no t yet approved or cleared by the [place] FDA and  has been authorized for detection and/or diagnosis of SARS-CoV-2 by FDA under an Emergency Use Authorization (EUA). This EUA will remain  in effect (meaning this test can be used) for the duration of the COVID-19 declaration under Section 564(b)(1) of the Act, 21 U.S.C.section 360bbb-3(b)(1), unless the authorization is terminated  or revoked sooner.       Influenza A by PCR NEGATIVE NEGATIVE Final   Influenza B by PCR NEGATIVE NEGATIVE Final    Comment: (NOTE) The Xpert Xpress SARS-CoV-2/FLU/RSV plus assay is intended as an aid in the diagnosis of influenza from Nasopharyngeal swab specimens and should  not be used as a sole basis for treatment. Nasal washings and aspirates are unacceptable for Xpert Xpress SARS-CoV-2/FLU/RSV testing.  Fact Sheet for Patients: BloggerCourse.com  Fact Sheet for Healthcare Providers: SeriousBroker.it  This test is not yet approved or cleared by the [place] FDA and has been authorized for detection and/or diagnosis of SARS-CoV-2 by FDA under an Emergency Use Authorization (EUA). This EUA will remain in effect (meaning this test can be used) for the duration of the COVID-19 declaration under Section 564(b)(1) of the Act, 21 U.S.C. section 360bbb-3(b)(1), unless the authorization is terminated or revoked.  Performed at [facility], [address], [place], [place] [address]   Culture, blood (Routine X 2) w Reflex to ID Panel     Status: None (Preliminary result)   Collection Time: 06/18/20 11:18 AM   Specimen: BLOOD RIGHT HAND  Result Value Ref Range Status   Specimen Description BLOOD RIGHT HAND  Final   Special Requests   Final    Blood Culture results may not be optimal due to an inadequate volume of blood received in culture bottles BOTTLES DRAWN AEROBIC AND ANAEROBIC   Culture   Final    NO GROWTH 2  DAYS Performed at [facility], [address] [place], [place] [address]    Report Status PENDING  Incomplete  Culture, blood (Routine X 2) w Reflex to ID Panel     Status: None (Preliminary result)   Collection Time: 06/18/20 11:18 AM   Specimen: BLOOD RIGHT WRIST  Result Value Ref Range Status   Specimen Description BLOOD RIGHT WRIST  Final   Special Requests   Final    Blood Culture results may not be optimal due to an inadequate volume of blood received in culture bottles BOTTLES DRAWN AEROBIC AND ANAEROBIC   Culture   Final    NO GROWTH 2 DAYS Performed at [facility], [address] [place], [place] [address]    Report Status PENDING  Incomplete  Culture, Urine     Status: Abnormal    Collection Time: 06/18/20 12:20 PM   Specimen: Urine, Clean Catch  Result Value Ref Range Status   Specimen Description   Final    URINE, CLEAN CATCH Performed at [facility], [address], [place], [place] [address]    Special Requests   Final    NONE Performed at [facility], [address], [place], [place] [address]    Culture (A)  Final    <10,000 COLONIES/mL INSIGNIFICANT GROWTH Performed at [facility], [address], [place], [place] [address]    Report Status 06/20/2020 FINAL  Final  Gastrointestinal Panel by PCR , Stool     Status: None   Collection Time: 06/19/20  5:17 PM   Specimen: STOOL  Result Value Ref Range Status   Campylobacter species NOT DETECTED NOT DETECTED Final   Plesimonas shigelloides NOT DETECTED NOT DETECTED Final   Salmonella species NOT DETECTED NOT DETECTED Final   Yersinia enterocolitica NOT DETECTED NOT DETECTED Final   Vibrio species NOT DETECTED NOT DETECTED Final   Vibrio cholerae NOT DETECTED NOT DETECTED Final   Enteroaggregative E coli (EAEC) NOT DETECTED NOT DETECTED Final   Enteropathogenic E coli (EPEC) NOT DETECTED NOT DETECTED Final   Enterotoxigenic E coli (ETEC) NOT DETECTED NOT DETECTED Final   Shiga like toxin producing E coli (STEC) NOT DETECTED NOT DETECTED Final   Shigella/Enteroinvasive E coli (EIEC) NOT DETECTED NOT DETECTED Final   Cryptosporidium NOT DETECTED NOT DETECTED Final   Cyclospora cayetanensis NOT DETECTED NOT DETECTED Final   Entamoeba histolytica NOT DETECTED NOT DETECTED Final   Giardia lamblia NOT DETECTED NOT DETECTED Final   Adenovirus F40/41 NOT DETECTED NOT DETECTED Final   Astrovirus NOT DETECTED NOT DETECTED Final   Norovirus GI/GII NOT DETECTED NOT DETECTED Final   Rotavirus A NOT DETECTED NOT DETECTED Final   Sapovirus (I, II, IV, and V) NOT DETECTED NOT DETECTED Final    Comment: Performed at [facility], [address] [place], [place] [address]  C Difficile Quick Screen  (NO PCR Reflex)     Status: Abnormal   Collection Time: 06/19/20  5:17 PM   Specimen: STOOL  Result Value Ref Range Status   C Diff antigen POSITIVE (A) NEGATIVE Final   C Diff toxin NEGATIVE NEGATIVE Final   C Diff interpretation   Final    Results are indeterminate. Please contact the provider listed for your campus for C diff questions in AMION.    Comment: Performed at [facility], [address] [place], [place] [address]     Scheduled Meds: . amLODipine  5 mg Oral Daily  . atorvastatin  20 mg Oral QHS  .  budesonide (PULMICORT) nebulizer solution  0.5 mg Nebulization BID  . Chlorhexidine Gluconate Cloth  6 each Topical Daily  . ipratropium-albuterol  3 mL Nebulization TID  . loperamide  2 mg Oral TID  . metoprolol tartrate  2.5 mg Intravenous Q6H  . pantoprazole  40 mg Intravenous Q12H  . sodium chloride flush  3 mL Intravenous Q12H  . sucralfate  1 g Oral TID with meals  . vancomycin  125 mg Oral QID   Continuous Infusions: . sodium chloride Stopped (06/18/20 1900)  . ceFEPime (MAXIPIME) IV Stopped (06/21/20 2315)  . magnesium sulfate bolus IVPB 2 g (06/22/20 0800)  . metronidazole 500 mg (06/22/20 0752)  . norepinephrine (LEVOPHED) Adult infusion Stopped (06/19/20 1905)  . potassium chloride 10 mEq (06/22/20 0756)    Procedures/Studies: CT ABDOMEN PELVIS WO CONTRAST  Result Date: 06/19/2020 CLINICAL DATA:  Follow-up abnormal chest radiograph. Shortness of breath for 1 week. EXAM: CT CHEST, ABDOMEN AND PELVIS WITHOUT CONTRAST TECHNIQUE: Multidetector CT imaging of the chest, abdomen and pelvis was performed following the standard protocol without IV contrast. COMPARISON:  CT AP 01/30/2020 and CTA chest 06/12/2020 FINDINGS: CT CHEST FINDINGS Cardiovascular: Heart size appears mildly enlarged. Aortic atherosclerosis. Coronary artery calcifications. No pericardial effusion. Mediastinum/Nodes: No mediastinal, supraclavicular or axillary adenopathy. There is an apparent small  air-filled tract arising off the left lateral wall of the esophagus at the level of the transverse aortic arch chest just proximal to the left mainstem bronchus. This appears to contain a small volume of gas and debris, image 16/2. No significant pneumo mediastinum identified. This finding may reflect collapsed esophagus with lateral deviation versus esophageal perforation status post EGD. The trachea appears patent and is midline. Normal appearance of the thyroid gland. Lungs/Pleura: Small bilateral pleural effusions are identified, new from previous exam. Subsegmental atelectasis is noted within the posteromedial right lower lobe, lingula and left lower lobe. No pneumothorax identified. Mild changes of centrilobular emphysema with diffuse bronchial wall thickening. Musculoskeletal: Multiple bilateral rib deformities are identified, likely posttraumatic. Unchanged. 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 Similar appearance of vertebra plana deformity at the T9 level. CT ABDOMEN PELVIS FINDINGS Hepatobiliary: No focal liver abnormality. Gallbladder appears diffusely distended. No signs of gallbladder wall thickening or inflammation. No bile duct dilatation Pancreas: Unremarkable. No pancreatic ductal dilatation or surrounding inflammatory changes. Spleen: Normal in size without focal abnormality. Adrenals/Urinary Tract: Normal appearance of the adrenal glands. No kidney mass or hydronephrosis. Urinary bladder is unremarkable. Stomach/Bowel: Hiatal hernia. The appendix is not confidently identified separate from the right lower quadrant bowel loops. No small bowel wall thickening, inflammation, or distension. A rectal tube is in place. Within the limitations of unenhanced technique there is wall thickening involving the distal sigmoid colon and rectum with diffuse perirectal soft tissue stranding and edema. This appears new from 01/30/2020. Vascular/Lymphatic: Aortic  atherosclerosis. No aneurysm. No abdominopelvic adenopathy identified. Reproductive: Status post hysterectomy. No adnexal masses. Other: No focal fluid collections identified. No signs of pneumoperitoneum. Midline ventral abdominal wall hernia is identified which contains fat only. Musculoskeletal: Previous L5-S1 PLIF with posterior translation of the interbody fusion cage with compression upon the spinal canal. This is unchanged from 01/30/2020. Grade 2 anterolisthesis is identified at this level, unchanged. Chronic fracture deformities involving the right superior and inferior pubic rami noted. IMPRESSION: 1. There is apparent air-filled tract arising off the left lateral wall of the esophagus at the level of the transverse aortic arch and left mainstem bronchus. Although this  may reflect lateral deviation of collapsed esophagus perforation cannot be excluded. If there is a clinical concern for esophageal perforation further evaluation with water-soluble esophagram or repeat CT of the chest following ingestion of water-soluble contrast material is advised. 2. Small bilateral pleural effusions with multifocal areas of lower lung zone subsegmental atelectasis 3. Wall thickening involving the rectosigmoid colon is identified with diffuse perirectal soft tissue stranding/edema. Correlate for any clinical signs or symptoms of inflammatory or proctitis. 4. Aortic Atherosclerosis (ICD10-I70.0) and Emphysema (ICD10-J43.9). 5. Coronary artery calcifications. 6. 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 Chronic bilateral rib fractures and chronic fractures involving the superior and inferior right pubic rami noted. Electronically Signed   By: Signa Kell M.D.   On: 06/19/2020 12:12   DG Chest 1 View  Result Date: 06/12/2020 CLINICAL DATA:  Shortness of breath EXAM: CHEST  1 VIEW COMPARISON:  January 15, 2020 FINDINGS: There is ill-defined airspace opacity in the right upper lobe. There is scarring  in the left base. The heart is enlarged with pulmonary vascularity normal, stable. There is aortic atherosclerosis. Bones are osteoporotic. There is extensive arthropathy in the right shoulder. IMPRESSION: Ill-defined airspace opacity consistent with pneumonia right upper lobe. Scarring left base. Stable cardiac enlargement. Bones osteoporotic. Aortic Atherosclerosis (ICD10-I70.0). Electronically Signed   By: Bretta Bang III M.D.   On: 06/12/2020 14:47   CT HEAD WO CONTRAST  Result Date: 06/18/2020 CLINICAL DATA:  Inpatient. Mental status change after EGD 06/17/2020. Restless. No reported injury. EXAM: CT HEAD WITHOUT CONTRAST TECHNIQUE: Contiguous axial images were obtained from the base of the skull through the vertex without intravenous contrast. COMPARISON:  None. FINDINGS: Brain: Small focus of cortical hypodensity in right occipital lobe (series 2/image 11). Nonspecific moderate subcortical and periventricular white matter hypodensity, most in keeping with chronic small vessel ischemic change. No evidence of parenchymal hemorrhage or extra-axial fluid collection. No mass lesion, mass effect, or midline shift. Cerebral volume is age appropriate. No ventriculomegaly. Vascular: No acute abnormality. Skull: No evidence of calvarial fracture. Sinuses/Orbits: The visualized paranasal sinuses are essentially clear. Other:  The mastoid air cells are unopacified. IMPRESSION: 1. Small focus of cortical hypodensity in the right occipital lobe, cannot exclude acute/subacute infarct. No acute intracranial hemorrhage. MRI brain without and with IV contrast could be considered for further evaluation as clinically warranted. 2. Moderate chronic small vessel ischemic changes in the cerebral white matter. These results will be called to the ordering clinician or representative by the Radiologist Assistant, and communication documented in the PACS or [employer]. Electronically Signed   By: Delbert Phenix M.D.   On:  06/18/2020 17:53   CT CHEST WO CONTRAST  Result Date: 06/19/2020 CLINICAL DATA:  Follow-up abnormal chest radiograph. Shortness of breath for 1 week. EXAM: CT CHEST, ABDOMEN AND PELVIS WITHOUT CONTRAST TECHNIQUE: Multidetector CT imaging of the chest, abdomen and pelvis was performed following the standard protocol without IV contrast. COMPARISON:  CT AP 01/30/2020 and CTA chest 06/12/2020 FINDINGS: CT CHEST FINDINGS Cardiovascular: Heart size appears mildly enlarged. Aortic atherosclerosis. Coronary artery calcifications. No pericardial effusion. Mediastinum/Nodes: No mediastinal, supraclavicular or axillary adenopathy. There is an apparent small air-filled tract arising off the left lateral wall of the esophagus at the level of the transverse aortic arch chest just proximal to the left mainstem bronchus. This appears to contain a small volume of gas and debris, image 16/2. No significant pneumo mediastinum identified. This finding may reflect collapsed esophagus with lateral deviation versus esophageal perforation status post  EGD. The trachea appears patent and is midline. Normal appearance of the thyroid gland. Lungs/Pleura: Small bilateral pleural effusions are identified, new from previous exam. Subsegmental atelectasis is noted within the posteromedial right lower lobe, lingula and left lower lobe. No pneumothorax identified. Mild changes of centrilobular emphysema with diffuse bronchial wall thickening. Musculoskeletal: Multiple bilateral rib deformities are identified, likely posttraumatic. Unchanged. 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 Similar appearance of vertebra plana deformity at the T9 level. CT ABDOMEN PELVIS FINDINGS Hepatobiliary: No focal liver abnormality. Gallbladder appears diffusely distended. No signs of gallbladder wall thickening or inflammation. No bile duct dilatation Pancreas: Unremarkable. No pancreatic ductal dilatation or surrounding  inflammatory changes. Spleen: Normal in size without focal abnormality. Adrenals/Urinary Tract: Normal appearance of the adrenal glands. No kidney mass or hydronephrosis. Urinary bladder is unremarkable. Stomach/Bowel: Hiatal hernia. The appendix is not confidently identified separate from the right lower quadrant bowel loops. No small bowel wall thickening, inflammation, or distension. A rectal tube is in place. Within the limitations of unenhanced technique there is wall thickening involving the distal sigmoid colon and rectum with diffuse perirectal soft tissue stranding and edema. This appears new from 01/30/2020. Vascular/Lymphatic: Aortic atherosclerosis. No aneurysm. No abdominopelvic adenopathy identified. Reproductive: Status post hysterectomy. No adnexal masses. Other: No focal fluid collections identified. No signs of pneumoperitoneum. Midline ventral abdominal wall hernia is identified which contains fat only. Musculoskeletal: Previous L5-S1 PLIF with posterior translation of the interbody fusion cage with compression upon the spinal canal. This is unchanged from 01/30/2020. Grade 2 anterolisthesis is identified at this level, unchanged. Chronic fracture deformities involving the right superior and inferior pubic rami noted. IMPRESSION: 1. There is apparent air-filled tract arising off the left lateral wall of the esophagus at the level of the transverse aortic arch and left mainstem bronchus. Although this may reflect lateral deviation of collapsed esophagus perforation cannot be excluded. If there is a clinical concern for esophageal perforation further evaluation with water-soluble esophagram or repeat CT of the chest following ingestion of water-soluble contrast material is advised. 2. Small bilateral pleural effusions with multifocal areas of lower lung zone subsegmental atelectasis 3. Wall thickening involving the rectosigmoid colon is identified with diffuse perirectal soft tissue stranding/edema.  Correlate for any clinical signs or symptoms of inflammatory or proctitis. 4. Aortic Atherosclerosis (ICD10-I70.0) and Emphysema (ICD10-J43.9). 5. Coronary artery calcifications. 6. 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 Chronic bilateral rib fractures and chronic fractures involving the superior and inferior right pubic rami noted. Electronically Signed   By: Signa Kell M.D.   On: 06/19/2020 12:12   CT CHEST W CONTRAST  Result Date: 06/20/2020 CLINICAL DATA:  EGD yesterday. Unenhanced CT at that time demonstrated what is likely a diverticulum involving the distal esophagus. The CT examination is repeated with IV and oral contrast today. EXAM: CT CHEST WITH CONTRAST TECHNIQUE: Multidetector CT imaging of the chest was performed during intravenous contrast administration. Oral contrast was also administered in order to evaluate the esophagus. CONTRAST:  75mL OMNIPAQUE IOHEXOL 300 MG/ML IV. COMPARISON:  Unenhanced CT chest yesterday. FINDINGS: Beam hardening streak artifact related to the fact that the patient was unable to raise the arms makes the examination less than optimal. Cardiovascular: Heart moderately enlarged. Mitral annular and aortic annular calcifications. Severe three-vessel coronary atherosclerosis. No pericardial effusion. Severe atherosclerosis involving the thoracic and upper abdominal aorta without evidence of aneurysm. Mediastinum/Nodes: Oral contrast within the esophagus. Fluid-filled structure measuring approximately 2 cm adjacent to the distal  esophagus; there is no oral contrast within this fluid-filled structure. Remainder of the esophagus mildly distended but otherwise normal in appearance. No pathologic lymphadenopathy. Multiple small thyroid nodules in an upper normal sized to slightly enlarged gland, the largest measuring approximately 8 mm. Lungs/Pleura: Atelectasis involving the lower lobes bilaterally. More confluent airspace consolidation is present deep  in the RIGHT LOWER LOBE. Mild ground-glass airspace opacity in the lingula. Lungs otherwise clear. No parenchymal nodules or masses. No pleural effusions. Central airways patent without significant bronchial wall thickening. Upper Abdomen: Chronic elevation RIGHT hemidiaphragm. 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 No acute findings. Musculoskeletal: Remote severe compression fracture of T10 with an associated kyphous deformity. Lucent lesion involving the body of the sternum with sclerotic margins and extraosseous extension and/or pathologic fracture. Severe degenerative changes in the shoulder joints. IMPRESSION: 1. 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 There is no oral contrast within this structure. 2. Atelectasis involving the lower lobes bilaterally. More confluent airspace consolidation deep in the RIGHT LOWER LOBE, query superimposed pneumonia. Mild ground-glass airspace opacity in the lingula is also felt to be infectious or inflammatory. 3. Lucent lesion involving the body of the sternum with extraosseous extension and/or pathologic fracture. 4. Moderate cardiomegaly. Severe three-vessel coronary atherosclerosis. 5. Multinodular goiter. The largest nodule is 8 mm. Not clinically significant; no follow-up imaging recommended. (Ref: J Am Coll Radiol. 2015 Feb;12(2): 143-50). 6. Aortic Atherosclerosis (ICD10-I70.0). Electronically Signed   By: Hulan Saas M.D.   On: 06/20/2020 14:26   CT Angio Chest PE W and/or Wo Contrast  Result Date: 06/12/2020 CLINICAL DATA:  80 year old female with shortness of breath. EXAM: CT ANGIOGRAPHY CHEST WITH CONTRAST TECHNIQUE: Multidetector CT imaging of the chest was performed using the standard protocol during bolus administration of intravenous contrast. Multiplanar CT image reconstructions and MIPs were obtained to evaluate the vascular anatomy.  CONTRAST:  Seventy-five mL Omnipaque 350, intravenous COMPARISON:  01/04/2020 FINDINGS: Cardiovascular: Satisfactory opacification of the pulmonary arteries to the segmental level. No evidence of pulmonary embolism. Similar appearing moderate global cardiomegaly. Mitral annular calcifications. Severe coronary atherosclerotic calcifications. Scattered atherosclerotic calcifications of the thoracic aorta. No pericardial effusion. Mediastinum/Nodes: No enlarged mediastinal, hilar, or axillary lymph nodes. The esophagus is patulous throughout with a standing column of enteric contents. Thyroid gland and trachea demonstrate no significant findings. Lungs/Pleura: Bibasilar and lingular subsegmental atelectasis. Mild upper lobe predominant centrilobular emphysema. No suspicious pulmonary nodules. No pleural effusion or pneumothorax. 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 The visualized upper abdomen is otherwise within normal limits. Musculoskeletal: Progressive compression deformity of the T9 vertebral body with associated kyphosis at this level, now nearly vertebra plana morphology. Mild, unchanged retropulsion. 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 Multilevel chronic, bilateral posterolateral nondisplaced rib fractures. Advanced degenerative changes of the glenohumeral joints bilaterally. No acute osseous abnormality. Review of the MIP images confirms the above findings. IMPRESSION: Vascular: 1. No evidence of pulmonary embolism. 2. Unchanged moderate global cardiomegaly. 3. Severe coronary atherosclerotic calcifications. 4.  Aortic Atherosclerosis (ICD10-I70.0). Non-Vascular: 1. Bibasilar and lingular subsegmental atelectasis. 2. Progressive compression deformity of the T9 vertebral body, now essentially vertebra plana. Unchanged mild retropulsion. Marliss Coots, MD [facility] Inter[facility]iology  [facility] Radiology Electronically Signed   By: Marliss Coots MD   On: 06/12/2020 16:45   MR BRAIN WO CONTRAST  Result Date: 06/19/2020 CLINICAL DATA:  Mental status change. EXAM:  MRI HEAD WITHOUT CONTRAST TECHNIQUE: Multiplanar, multiecho pulse sequences of the brain and surrounding structures were obtained without intravenous contrast. COMPARISON:  Head CT June 18, 2020 FINDINGS: The study is partially degraded by motion. Brain: No acute infarction, hemorrhage, hydrocephalus, extra-axial collection or mass lesion. Scattered and confluent foci of T2 hyperintensity are seen within the white matter of the cerebral hemispheres and within the pons, nonspecific, most likely related to chronic small vessel ischemia. 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 Hypodense area described on prior CT corresponds to prominent arachnoid granulation in the right transverse sinus. Prominent vascular susceptibility artifact on the GRE sequence is related to recent administration of ferumoxytol seizure. Vascular: Normal flow voids. Skull and upper cervical spine: Normal marrow signal. Sinuses/Orbits: Negative. Other: None. IMPRESSION: 1. No acute intracranial abnormality. 2. 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 3. Moderate chronic small vessel ischemia. Electronically Signed   By: Baldemar Lenis M.D.   On: 06/19/2020 09:52   US Venous Img Lower  Left (DVT Study)  Result Date: 06/12/2020 CLINICAL DATA:  Lower extremity pain and edema EXAM: LEFT LOWER EXTREMITY VENOUS DUPLEX ULTRASOUND TECHNIQUE: Gray-scale sonography with graded compression, as well as color Doppler and duplex ultrasound were performed to evaluate the left lower extremity deep venous system from the level of the common femoral vein and including the common femoral, femoral, profunda femoral, popliteal and calf veins including the posterior tibial, peroneal and  gastrocnemius veins when visible. The superficial great saphenous vein was also interrogated. Spectral Doppler was utilized to evaluate flow at rest and with distal augmentation maneuvers in the common femoral, femoral and popliteal veins. COMPARISON:  None. FINDINGS: Contralateral Common Femoral Vein: Respiratory phasicity is normal and symmetric with the symptomatic side. No evidence of thrombus. Normal compressibility. Common Femoral Vein: No evidence of thrombus. Normal compressibility, respiratory phasicity and response to augmentation. Saphenofemoral Junction: No evidence of thrombus. Normal compressibility and flow on color Doppler imaging. Profunda Femoral Vein: No evidence of thrombus. Normal compressibility and flow on color Doppler imaging. Femoral Vein: No evidence of thrombus. Normal compressibility, respiratory phasicity and response to augmentation. Popliteal Vein: No evidence of thrombus. Normal compressibility, respiratory phasicity and response to augmentation. Calf Veins: No evidence of thrombus. Normal compressibility and flow on color Doppler imaging. Superficial Great Saphenous Vein: No evidence of thrombus. Normal compressibility. Venous Reflux:  None. Other Findings:  Soft tissue edema noted in the calf region. IMPRESSION: No evidence of deep venous thrombosis in the left lower extremity. Right common femoral vein patent. There is left calf region soft tissue edema. Electronically Signed   By: Bretta Bang III M.D.   On: 06/12/2020 14:46   DG Chest Port 1 View  Result Date: 06/19/2020 CLINICAL DATA:  Central line placement. EXAM: PORTABLE CHEST 1 VIEW COMPARISON:  April 09/30/2020. FINDINGS: Low lung volumes with bibasilar opacities. No visible pleural effusions or pneumothorax on this portable semi rib upright radiograph. Mild enlargement the cardiac silhouette. Calcific atherosclerosis of the aorta. Right IJ central venous catheter with the tip projecting at the superior cavoatrial  junction. Polyarticular degenerative change. IMPRESSION: 1. Right IJ central venous catheter with the tip projecting at the superior cavoatrial junction. 2. Low lung volumes with bibasilar opacities that are better characterized on same day CT chest. Electronically Signed   By: Feliberto Harts MD   On: 06/19/2020 11:15   DG CHEST PORT 1 VIEW  Result Date: 06/18/2020 CLINICAL DATA:  Shortness  of breath, hypoxia, history hypertension, smoker EXAM: PORTABLE CHEST 1 VIEW COMPARISON:  Portable exam 1043 hours compared to 06/15/2020 FINDINGS: Upper normal heart size. Mediastinal contours and pulmonary vascularity normal. Atherosclerotic calcification aorta. Bronchitic changes with bibasilar atelectasis. Remaining lungs clear. No pleural effusion or pneumothorax. Bones demineralized with advanced RIGHT glenohumeral degenerative changes. IMPRESSION: Persistent bronchitic changes and bibasilar atelectasis. Aortic Atherosclerosis (ICD10-I70.0). Electronically Signed   By: Ulyses Southward M.D.   On: 06/18/2020 11:13   DG CHEST PORT 1 VIEW  Result Date: 06/15/2020 CLINICAL DATA:  Shortness of breath, hypertension, former smoker EXAM: PORTABLE CHEST 1 VIEW COMPARISON:  Portable exam 0656 hours compared to 06/12/2020 FINDINGS: Enlargement of cardiac silhouette. Mediastinal contours and pulmonary vascularity normal. Atherosclerotic calcification aorta. Bibasilar atelectasis. No acute infiltrate, pleural effusion, or pneumothorax. Bones demineralized with advanced degenerative changes of the RIGHT glenohumeral joint. IMPRESSION: Enlargement of cardiac silhouette with bibasilar atelectasis. Aortic Atherosclerosis (ICD10-I70.0). Electronically Signed   By: Ulyses Southward M.D.   On: 06/15/2020 08:06   DG Abd 2 Views  Result Date: 06/19/2020 CLINICAL DATA:  Abdominal pain and altered mental status. EXAM: ABDOMEN - 2 VIEW COMPARISON:  CT abdomen and pelvis 01/30/2020 FINDINGS: Scattered gas and stool in the colon. No definite small  or large bowel dilatation although paucity of gas limits evaluation. No free intra-abdominal air. No abnormal air-fluid levels. No radiopaque stones. There is evidence of infiltration or atelectasis in both lung bases. Postoperative changes in the lumbosacral spine. Degenerative changes in the lumbar spine and hips. IMPRESSION: 1. Nonobstructive bowel gas pattern. 2. Infiltration or atelectasis in both lung bases. Electronically Signed   By: Burman Nieves M.D.   On: 06/19/2020 02:10   ECHOCARDIOGRAM COMPLETE  Result Date: 06/13/2020    ECHOCARDIOGRAM REPORT   Patient Name:   Brenda Hopkins Date of Exam: 06/13/2020 Medical Rec #:  [id number]           Height:       64.0 in Accession #:    [id number]          Weight:       157.0 lb Date of Birth:  07-01-1940           BSA:          1.765 m Patient Age:    79 years            BP:           108/55 mmHg Patient Gender: F                   HR:           91 bpm. [facility] Procedure: 2D Echo Indications:    CHF-Acute Diastolic I50.31  History:        Patient has prior history of Echocardiogram examinations, most                 recent 01/16/2020. Risk Factors:Hypertension, Dyslipidemia and                 Former Smoker.  Sonographer:    Jeryl Columbia RDCS (AE) Referring Phys: [id number] [address] IMPRESSIONS  1. Left ventricular ejection fraction, by estimation, is 55 to 60%. The left ventricle has normal function. The left ventricle has no regional wall motion abnormalities. There is mild concentric left ventricular hypertrophy of the basal-septal segment. Left ventricular diastolic parameters are consistent with Grade I diastolic dysfunction (impaired relaxation).  2. Right ventricular systolic function  is normal. The right ventricular size is normal. There is mildly elevated pulmonary artery systolic pressure. The estimated right ventricular systolic pressure is 42.0 mmHg.  3. The mitral valve is grossly normal. No evidence of mitral valve  regurgitation. No evidence of mitral stenosis. Severe mitral annular calcification.  4. The aortic valve was not well visualized. There is mild calcification of the aortic valve. Aortic valve regurgitation is not visualized. No aortic stenosis is present.  5. The inferior vena cava is dilated in size with <50% respiratory variability, suggesting right atrial pressure of 15 mmHg. Comparison(s): A prior study was performed on 01/16/2020. Left ventricle is less hyperdynamic. FINDINGS  Left Ventricle: Left ventricular ejection fraction, by estimation, is 55 to 60%. The left ventricle has normal function. The left ventricle has no regional wall motion abnormalities. The left ventricular internal cavity size was normal in size. There is  mild concentric left ventricular hypertrophy of the basal-septal segment. Left ventricular diastolic parameters are consistent with Grade I diastolic dysfunction (impaired relaxation). Right Ventricle: The right ventricular size is normal. No increase in right ventricular wall thickness. Right ventricular systolic function is normal. There is mildly elevated pulmonary artery systolic pressure. The tricuspid regurgitant velocity is 2.60  m/s, and with an assumed right atrial pressure of 15 mmHg, the estimated right ventricular systolic pressure is 42.0 mmHg. Left Atrium: Left atrial size was normal in size. Right Atrium: Right atrial size was normal in size. Pericardium: There is no evidence of pericardial effusion. Mitral Valve: The mitral valve is grossly normal. There is mild thickening of the mitral valve leaflet(s). There is moderate calcification of the mitral valve leaflet(s). Severe mitral annular calcification. No evidence of mitral valve regurgitation. No evidence of mitral valve stenosis. MV peak gradient, 6.7 mmHg. The mean mitral valve gradient is 3.0 mmHg with average heart rate of 98 bpm. Tricuspid Valve: The tricuspid valve is grossly normal. Tricuspid valve regurgitation is  trivial. No evidence of tricuspid stenosis. Aortic Valve: The aortic valve was not well visualized. There is mild calcification of the aortic valve. Aortic valve regurgitation is not visualized. No aortic stenosis is present. Pulmonic Valve: The pulmonic valve was not well visualized. Pulmonic valve regurgitation is not visualized. No evidence of pulmonic stenosis. Aorta: The aortic root is normal in size and structure. Venous: The inferior vena cava is dilated in size with less than 50% respiratory variability, suggesting right atrial pressure of 15 mmHg. IAS/Shunts: The atrial septum is grossly normal.  LEFT VENTRICLE PLAX 2D LVIDd:         4.18 cm  Diastology LVIDs:         2.99 cm  LV e' medial:    5.00 cm/s LV PW:         1.40 cm  LV E/e' medial:  18.3 LV IVS:        1.35 cm  LV e' lateral:   4.24 cm/s LVOT diam:     2.10 cm  LV E/e' lateral: 21.5 LVOT Area:     3.46 cm  RIGHT VENTRICLE RV S prime:     14.30 cm/s TAPSE (M-mode): 2.4 cm LEFT ATRIUM             Index       RIGHT ATRIUM           Index LA diam:        4.30 cm 2.44 cm/m  RA Area:     14.40 cm LA Vol (A2C):   52.3 ml  29.63 ml/m RA Volume:   35.60 ml  20.17 ml/m LA Vol (A4C):   48.3 ml 27.37 ml/m LA Biplane Vol: 52.1 ml 29.52 ml/m   AORTA Ao Root diam: 2.50 cm MITRAL VALVE                TRICUSPID VALVE MV Area (PHT): 3.36 cm     TR Peak grad:   27.0 mmHg MV Peak grad:  6.7 mmHg     TR Vmax:        260.00 cm/s MV Mean grad:  3.0 mmHg MV Vmax:       1.29 m/s     SHUNTS MV Vmean:      87.5 cm/s    Systemic Diam: 2.10 cm MV Decel Time: 226 msec MV E velocity: 91.30 cm/s MV A velocity: 120.00 cm/s MV E/A ratio:  0.76 Riley Lam MD Electronically signed by Riley Lam MD Signature Date/Time: 06/13/2020/3:04:50 PM    Final    US Abdomen Limited RUQ (LIVER/GB)  Result Date: 06/15/2020 CLINICAL DATA:  Diffuse abdominal pain. 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 EXAM: ULTRASOUND ABDOMEN LIMITED  RIGHT UPPER QUADRANT COMPARISON:  Prior CT scan November 2021 FINDINGS: Gallbladder: No gallstones or wall thickening visualized. No sonographic Murphy sign noted by sonographer. Common bile duct: Diameter: Within normal limits at 5-6 mm Liver: No focal lesion identified. Within normal limits in parenchymal echogenicity. Portal vein is patent on color Doppler imaging with normal direction of blood flow towards the liver. Other: None. IMPRESSION: Negative right upper quadrant ultrasound. Electronically Signed   By: Malachy Moan M.D.   On: 06/15/2020 09:55    Catarina Hartshorn, DO  [facility]  If 7PM-7AM, please contact night-coverage [email or web address] Password [email or web address] 06/22/2020, 8:41 AM   LOS: 10 days

## 2020-06-23 ENCOUNTER — Encounter (HOSPITAL_COMMUNITY): Payer: Self-pay | Admitting: Internal Medicine

## 2020-06-23 DIAGNOSIS — Z515 Encounter for palliative care: Secondary | ICD-10-CM | POA: Diagnosis not present

## 2020-06-23 DIAGNOSIS — Z7189 Other specified counseling: Secondary | ICD-10-CM | POA: Diagnosis not present

## 2020-06-23 DIAGNOSIS — J9601 Acute respiratory failure with hypoxia: Secondary | ICD-10-CM | POA: Diagnosis not present

## 2020-06-23 DIAGNOSIS — N179 Acute kidney failure, unspecified: Secondary | ICD-10-CM | POA: Diagnosis not present

## 2020-06-23 DIAGNOSIS — G9341 Metabolic encephalopathy: Secondary | ICD-10-CM | POA: Diagnosis not present

## 2020-06-23 DIAGNOSIS — I5033 Acute on chronic diastolic (congestive) heart failure: Secondary | ICD-10-CM | POA: Diagnosis not present

## 2020-06-23 LAB — CBC
HCT: 34.1 % — ABNORMAL LOW (ref 36.0–46.0)
Hemoglobin: 10.1 g/dL — ABNORMAL LOW (ref 12.0–15.0)
MCH: 26.6 pg (ref 26.0–34.0)
MCHC: 29.6 g/dL — ABNORMAL LOW (ref 30.0–36.0)
MCV: 90 fL (ref 80.0–100.0)
Platelets: 334 10*3/uL (ref 150–400)
RBC: 3.79 MIL/uL — ABNORMAL LOW (ref 3.87–5.11)
RDW: 18.3 % — ABNORMAL HIGH (ref 11.5–15.5)
WBC: 7.4 10*3/uL (ref 4.0–10.5)
nRBC: 0 % (ref 0.0–0.2)

## 2020-06-23 LAB — MAGNESIUM: Magnesium: 1.7 mg/dL (ref 1.7–2.4)

## 2020-06-23 LAB — CULTURE, BLOOD (ROUTINE X 2)
Culture: NO GROWTH
Culture: NO GROWTH

## 2020-06-23 LAB — COMPREHENSIVE METABOLIC PANEL
ALT: 14 U/L (ref 0–44)
AST: 15 U/L (ref 15–41)
Albumin: 2.6 g/dL — ABNORMAL LOW (ref 3.5–5.0)
Alkaline Phosphatase: 86 U/L (ref 38–126)
Anion gap: 10 (ref 5–15)
BUN: 8 mg/dL (ref 8–23)
CO2: 26 mmol/L (ref 22–32)
Calcium: 8.3 mg/dL — ABNORMAL LOW (ref 8.9–10.3)
Chloride: 104 mmol/L (ref 98–111)
Creatinine, Ser: 0.67 mg/dL (ref 0.44–1.00)
GFR, Estimated: >60 mL/min (ref >60)
Glucose, Bld: 95 mg/dL (ref 70–99)
Potassium: 3.4 mmol/L — ABNORMAL LOW (ref 3.5–5.1)
Sodium: 140 mmol/L (ref 135–145)
Total Bilirubin: 0.8 mg/dL (ref 0.3–1.2)
Total Protein: 5.7 g/dL — ABNORMAL LOW (ref 6.5–8.1)

## 2020-06-23 MED ORDER — FUROSEMIDE 20 MG PO TABS
20.0000 mg | ORAL_TABLET | Freq: Every day | ORAL | Status: DC
  Administered 2020-06-24: 20 mg via ORAL
  Filled 2020-06-23: qty 1

## 2020-06-23 MED ORDER — POTASSIUM CHLORIDE CRYS ER 20 MEQ PO TBCR
20.0000 meq | EXTENDED_RELEASE_TABLET | Freq: Once | ORAL | Status: AC
  Administered 2020-06-23: 20 meq via ORAL
  Filled 2020-06-23: qty 1

## 2020-06-23 MED ORDER — MAGNESIUM SULFATE 2 GM/50ML IV SOLN
2.0000 g | Freq: Once | INTRAVENOUS | Status: AC
  Administered 2020-06-23: 2 g via INTRAVENOUS
  Filled 2020-06-23: qty 50

## 2020-06-23 MED ORDER — AMOXICILLIN-POT CLAVULANATE 875-125 MG PO TABS
1.0000 | ORAL_TABLET | Freq: Two times a day (BID) | ORAL | Status: DC
  Administered 2020-06-23 – 2020-06-24 (×2): 1 via ORAL
  Filled 2020-06-23 (×2): qty 1

## 2020-06-23 NOTE — Progress Notes (Addendum)
PROGRESS NOTE  Brenda Hopkins DTO:[id number] DOB: 1940-10-11 DOA: 06/12/2020 PCP: Shawnie Dapper, PA-C Brief History: 80 y.o.female,with medical history significant forhypertension, anxiety, marginalulcer, tobacco abuseandchronic paindue to complaints of shortness of breathof one week duration. Shereports dyspneawith exertion, and laying flat, as well she does report some epigastric/lower chest pain, does not radiate, as well she does report worsening lower extremity edema.she was recently seen by her PCP for lower extremity edema which she was started on Lasix for last 4 days, report was told to have A. fib, and referral has been made to cardiologist but she did not get to see themprior to admission. She has a significant tobacco hx of >50 pack years. In the ED,BNP of 1270, chest x-ray significant for vascular congestion, EKG nonacute, troponins non-ACS pattern 71>63,was given IV Lasix, and [facility] hospitalist consulted to admit for CHF On 06/18/20, the patient was moved to ICU due to concerns of hematochezia and low BP. She became more encephalopathic which was a change from her prior mental status. Work up for AMS was undertaken. It was felt this was primarily driven by new septic like clinical picture. The patient was started on empiric vanc and cefepime initially. She remained hypotensive despite fluid resuscitation and levophed was started. GOC was discussed with patient's daughter and patient was changed to DNR. Palliative medicine was consulted.  Fortunately, the patient stabilized with IV fluids, IV abx and vasopressor support. Her levophed was weaned off 06/19/20. She continued to improve clinically although her mental status was slow to improve, but did also continue to improve.  Assessment/Plan: Acute metabolic Encephalopathy -06/18/20--developed acute encephalopathy--somonolence -06/22/20-more awake and conversant, intermittently agitated, pleasantly  confused otherwise -06/23/20--more awake--scratched hole in central line -4/7/22ABG 7.461/48/56/33 (0.36) -due to sepsis, AKI, hypoxia -EKG-sinus, nonspecific ST changes -ammonia--23 -B12--205 -check PCT5.85>>7.15 -d/c gabapentin, seroquel. Requip, oxycodoone -CT brain--Small focus of cortical hypodensity in the right occipital lobe -MR brain--no acute findings  -minimize opioids/hypnotic meds  Acute on chronic diastolic CHF -BNP 1270 >>>589, 282, 198 -NEG 11L since admissionprior to transfer to ICU 4/7 -Continue IV Lasix40 mg daily>>po lasix 06/17/20--now on hold due to hypotension/sepsis -Monitoring I's and O's, daily weight -2D echo--ejection fraction 55-60%,G1DD, no WMA, mild elevated PASP -appreciateconsulting cardiology -CTAchest--negpulmonary embolis;cardiomegaly, severe coronary atherosclerosis, aortic atherosclerosis,.. 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 Although this may reflect lateral deviation of collapsed esophagus perforation cannot be excluded -06/20/20 CT chest with contrast--no perforation of esophagus.  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 Mild ground-glass airspace opacity in the lingula is also felt to be infectious or inflammatory  Septic shock -check PCT5.85>>7.15>>2.43 -lactate 1.2 -due to pneumonia andintraabdominal source  -4/7 blood culture--neg to date -4/7 UA--no pyuria -continueempiric cefepimemetronidazole -4/9 CT chest--Narrow necked diverticulum arising from the distal esophagus versus mediastinal cyst involving the lower mediastinum.No leak.More confluent airspace consolidation deep in the RIGHT LOWER LOBE, query superimposed pneumonia. Mild ground-glass airspace opacity in the lingula is also felt to be infectious or inflammatory. -CT chest/abd--as above -start levophed>>weaned off 4/8 -sepsis physiology resolved  Lobar Pneumonia -06/20/20 CT chest as above -continue cefepime/metronidazole -stable on 3L>>2L>>RA  AKI -baseline creatinine 0.6-0.9 -peaking at 1.74>>1.68>>0.81 -due to hypotension, volume depletion -improved  Questionable Atrial Fibrillation -No definitive atrial fibrillation noted on telemetry-->personally reviewed--sinus with PACs -appreciate cardiology input -One strip labeled as 10 beats NSVT overnight but appears more consistent with narrow-complex tachycardia. K+was5.7 on 4/5/22and Kayexalate has been administered. Mg stable at 2.1. 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 -Post prandial with every meal developed gastric pain then followed with nausea vomiting -ContinueProtonix, Carafate -Consulting gastroenterology >>>appreciate input -Ultrasound right upper quadrant negative for cholelithiasis or cholecystitis  Iron deficiency Anemia -iron saturation 7 -ferritin 12 -feraheme x 1on 06/17/20  Hyperlipidemia -continue statin  HTN -holdingamlodipine>>restarted  Chronic pain syndrome -continue gabapentin -continue home dose oxycodone -PMDP reviewed  Hyperkalemia -improved with kayexalate  Hypertension -increase IV lopressor to q6 hours>>po metoprolol -restarted amlodipine      Status is: Inpatient  Remains inpatient appropriate because:IV treatments appropriate due to intensity of illness or inability to take PO   Dispo: The patient is from:Home Anticipated d/c is ZO:XWRU Patient currently is not medically stable to d/c. Difficult to place patient No       Family Communication:daughter updated 06/23/20  Consultants:cardiology; palliative  Code Status: DNR  DVT Prophylaxis: SCDs   Procedures: As Listed in Progress Note Above  Antibiotics: Cefepime 4/7>>4/12 vanc 4/7 x 1 Metronidazole 4/8>>4/12 vanco po 4/10>> -amox/clav 4/12>>4/13       Subjective: Patient denies fevers, chills, headache, chest pain, dyspnea, nausea, vomiting, diarrhea, abdominal pain, dysuria, hematuria, hematochezia, and melena.   Objective: Vitals:   06/23/20 1400 06/23/20 1410 06/23/20 1500 06/23/20 1600  BP:      Pulse: 80  73 75  Resp: (!) 22  16 16   Temp:      TempSrc:      SpO2: 95% 97% 96% 97%  Weight:      Height:        Intake/Output Summary (Last 24 hours) at 06/23/2020 1723 Last data filed at 06/23/2020 1600 Gross per 24 hour  Intake 1891.82 ml  Output --  Net 1891.82 ml   Weight change: 1.1  kg Exam:   General:  Pt is alert, follows commands appropriately, not in acute distress  HEENT: No icterus, No thrush, No neck mass, [place]/AT  Cardiovascular: RRR, S1/S2, no rubs, no gallops  Respiratory: bibasilar rales L>R.  No wheeze  Abdomen: Soft/+BS, non tender, non distended, no guarding  Extremities: No edema, No lymphangitis, No petechiae, No rashes, no synovitis   Data Reviewed: I have personally reviewed following labs and imaging studies Basic Metabolic Panel: Recent Labs  Lab 06/19/20 0145 06/20/20 0448 06/21/20 1017 06/22/20 0435 06/23/20 0436  NA 135 141 139 141 140  K 4.0 3.4* 3.0* 2.8* 3.4*  CL 94* 102  101 102 104  CO2 31 32 GLUCOSE 95 75 153* 94 95  BUN 30* CREATININE 1.68* 0.81 0.79 0.66 0.67  CALCIUM 8.1* 8.0* 8.4* 8.3* 8.3*  MG  --  1.8 1.5* 1.5* 1.7   Liver Function Tests: Recent Labs  Lab 06/19/20 0145 06/20/20 0448 06/21/20 1017 06/22/20 0435 06/23/20 0436  AST ALT ALKPHOS 113 95 109 101 86  BILITOT 1.0 0.8 0.8 0.7 0.8  PROT 5.3* 4.7* 6.2* 6.0* 5.7*  ALBUMIN 2.4* 2.1* 2.8* 2.6* 2.6*   Recent Labs  Lab 06/19/20 0145  LIPASE 19  AMYLASE 72   Recent Labs  Lab 06/18/20 1118  AMMONIA 23   Coagulation Profile: No results for input(s): INR, PROTIME in the last 168 hours. CBC: Recent Labs  Lab 06/18/20 0505 06/18/20 1118 06/19/20 0145 06/20/20 0448 06/21/20 1017 06/22/20 0435 06/23/20 0436  WBC 19.5*   < > 15.2* 10.0 9.8 9.9 7.4  NEUTROABS 17.3*  --  13.1* 8.5*  --   --   --   HGB 10.2*   < > 9.3* 8.3* 10.1* 9.9* 10.1*  HCT 34.5*   < > 30.9* 29.0* 34.1* 34.3* 34.1*  MCV 89.1   < > 87.3 91.2 89.7 89.8 90.0  PLT 299   < > 291 242 311 349 334   < > = values in this interval not displayed.   Cardiac Enzymes: No results for input(s): CKTOTAL, CKMB, CKMBINDEX, TROPONINI in the last 168 hours. BNP: Invalid input(s): POCBNP CBG: Recent Labs  Lab 06/19/20 1131  06/20/20 0754 06/20/20 0814 06/20/20 1140 06/20/20 1642  GLUCAP 89 69* 141* 71 85   HbA1C: No results for input(s): HGBA1C in the last 72 hours. Urine analysis:    Component Value Date/Time   COLORURINE AMBER (A) 06/18/2020 1219   APPEARANCEUR CLEAR 06/18/2020 1219   LABSPEC 1.010 06/18/2020 1219   PHURINE 5.0 06/18/2020 1219   GLUCOSEU NEGATIVE 06/18/2020 1219   HGBUR SMALL (A) 06/18/2020 1219   BILIRUBINUR NEGATIVE 06/18/2020 1219   KETONESUR NEGATIVE 06/18/2020 1219   PROTEINUR NEGATIVE 06/18/2020 1219   NITRITE NEGATIVE 06/18/2020 1219   LEUKOCYTESUR NEGATIVE 06/18/2020 1219   Sepsis Labs: (procalcitonin:4,lacticidven:4) ) Recent Results (from the past 240 hour(s))  Culture, blood (Routine X 2) w Reflex to ID Panel     Status: None   Collection Time: 06/18/20 11:18 AM   Specimen: BLOOD RIGHT HAND  Result Value Ref Range Status   Specimen Description BLOOD RIGHT HAND  Final   Special Requests   Final    Blood Culture results may not be optimal due to an inadequate volume of blood received in culture bottles BOTTLES DRAWN AEROBIC AND ANAEROBIC   Culture   Final    NO GROWTH 5 DAYS Performed at [facility], [address], [place], [place] [address]    Report Status 06/23/2020 FINAL  Final  Culture, blood (Routine X 2) w Reflex to ID Panel     Status: None   Collection Time: 06/18/20 11:18 AM   Specimen: BLOOD RIGHT WRIST  Result Value Ref Range Status   Specimen Description BLOOD RIGHT WRIST  Final   Special Requests   Final    Blood Culture results may not be optimal due to an inadequate volume of blood received in culture bottles BOTTLES DRAWN AEROBIC AND ANAEROBIC   Culture   Final    NO  GROWTH 5 DAYS Performed at [facility], [address], [place], [place] [address]    Report Status 06/23/2020 FINAL  Final  Culture, Urine     Status: Abnormal   Collection Time: 06/18/20 12:20 PM   Specimen: Urine, Clean Catch  Result Value Ref Range Status    Specimen Description   Final    URINE, CLEAN CATCH Performed at [facility], [address], [place], [place] [address]    Special Requests   Final    NONE Performed at [facility], [address], [place], [place] [address]    Culture (A)  Final    <10,000 COLONIES/mL INSIGNIFICANT GROWTH Performed at [facility], [address], [place], [place] [address]    Report Status 06/20/2020 FINAL  Final  Gastrointestinal Panel by PCR , Stool     Status: None   Collection Time: 06/19/20  5:17 PM   Specimen: STOOL  Result Value Ref Range Status   Campylobacter species NOT DETECTED NOT DETECTED Final   Plesimonas shigelloides NOT DETECTED NOT DETECTED Final   Salmonella species NOT DETECTED NOT DETECTED Final   Yersinia enterocolitica NOT DETECTED NOT DETECTED Final   Vibrio species NOT DETECTED NOT DETECTED Final   Vibrio cholerae NOT DETECTED NOT DETECTED Final   Enteroaggregative E coli (EAEC) NOT DETECTED NOT DETECTED Final   Enteropathogenic E coli (EPEC) NOT DETECTED NOT DETECTED Final   Enterotoxigenic E coli (ETEC) NOT DETECTED NOT DETECTED Final   Shiga like toxin producing E coli (STEC) NOT DETECTED NOT DETECTED Final   Shigella/Enteroinvasive E coli (EIEC) NOT DETECTED NOT DETECTED Final   Cryptosporidium NOT DETECTED NOT DETECTED Final   Cyclospora cayetanensis NOT DETECTED NOT DETECTED Final   Entamoeba histolytica NOT DETECTED NOT DETECTED Final   Giardia lamblia NOT DETECTED NOT DETECTED Final   Adenovirus F40/41 NOT DETECTED NOT DETECTED Final   Astrovirus NOT DETECTED NOT DETECTED Final   Norovirus GI/GII NOT DETECTED NOT DETECTED Final   Rotavirus A NOT DETECTED NOT DETECTED Final   Sapovirus (I, II, IV, and V) NOT DETECTED NOT DETECTED Final    Comment: Performed at [facility], [address] [place], [place] [address]  C Difficile Quick Screen (NO PCR Reflex)     Status: Abnormal   Collection Time: 06/19/20  5:17 PM   Specimen: STOOL  Result  Value Ref Range Status   C Diff antigen POSITIVE (A) NEGATIVE Final   C Diff toxin NEGATIVE NEGATIVE Final   C Diff interpretation   Final    Results are indeterminate. Please contact the provider listed for your campus for C diff questions in AMION.    Comment: Performed at [facility], [address] [place], [place] [address]  C. Diff by PCR     Status: Abnormal   Collection Time: 06/21/20  8:52 PM   Specimen: STOOL  Result Value Ref Range Status   Toxigenic C. Difficile by PCR POSITIVE (A) NEGATIVE Final    Comment: Positive for toxigenic C. difficile with little to no toxin production. Only treat if clinical presentation suggests symptomatic illness. Performed at [facility], [address] [place], [place] [address]      Scheduled Meds: . amLODipine  5 mg Oral Daily  . atorvastatin  20 mg Oral QHS  . budesonide (PULMICORT) nebulizer solution  0.5 mg Nebulization BID  . Chlorhexidine Gluconate Cloth  6 each Topical Daily  . ipratropium-albuterol  3 mL Nebulization TID  . metoprolol  tartrate  25 mg Oral BID  . pantoprazole  40 mg Intravenous Q12H  . sodium chloride flush  3 mL Intravenous Q12H  . sucralfate  1 g Oral TID with meals  . vancomycin  125 mg Oral QID   Continuous Infusions: . sodium chloride Stopped (06/18/20 1900)  . ceFEPime (MAXIPIME) IV 2 g (06/23/20 1203)  . metronidazole 500 mg (06/23/20 1637)  . norepinephrine (LEVOPHED) Adult infusion Stopped (06/19/20 1905)    Procedures/Studies: CT ABDOMEN PELVIS WO CONTRAST  Result Date: 06/19/2020 CLINICAL DATA:  Follow-up abnormal chest radiograph. Shortness of breath for 1 week. EXAM: CT CHEST, ABDOMEN AND PELVIS WITHOUT CONTRAST TECHNIQUE: Multidetector CT imaging of the chest, abdomen and pelvis was performed following the standard protocol without IV contrast. COMPARISON:  CT AP 01/30/2020 and CTA chest 06/12/2020 FINDINGS: CT CHEST FINDINGS Cardiovascular: Heart size appears mildly enlarged. Aortic  atherosclerosis. Coronary artery calcifications. No pericardial effusion. Mediastinum/Nodes: No mediastinal, supraclavicular or axillary adenopathy. There is an apparent small air-filled tract arising off the left lateral wall of the esophagus at the level of the transverse aortic arch chest just proximal to the left mainstem bronchus. This appears to contain a small volume of gas and debris, image 16/2. No significant pneumo mediastinum identified. This finding may reflect collapsed esophagus with lateral deviation versus esophageal perforation status post EGD. The trachea appears patent and is midline. Normal appearance of the thyroid gland. Lungs/Pleura: Small bilateral pleural effusions are identified, new from previous exam. Subsegmental atelectasis is noted within the posteromedial right lower lobe, lingula and left lower lobe. No pneumothorax identified. Mild changes of centrilobular emphysema with diffuse bronchial wall thickening. Musculoskeletal: Multiple bilateral rib deformities are identified, likely posttraumatic. Unchanged. 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 Similar appearance of vertebra plana deformity at the T9 level. CT ABDOMEN PELVIS FINDINGS Hepatobiliary: No focal liver abnormality. Gallbladder appears diffusely distended. No signs of gallbladder wall thickening or inflammation. No bile duct dilatation Pancreas: Unremarkable. No pancreatic ductal dilatation or surrounding inflammatory changes. Spleen: Normal in size without focal abnormality. Adrenals/Urinary Tract: Normal appearance of the adrenal glands. No kidney mass or hydronephrosis. Urinary bladder is unremarkable. Stomach/Bowel: Hiatal hernia. The appendix is not confidently identified separate from the right lower quadrant bowel loops. No small bowel wall thickening, inflammation, or distension. A rectal tube is in place. Within the limitations of unenhanced technique there is wall thickening  involving the distal sigmoid colon and rectum with diffuse perirectal soft tissue stranding and edema. This appears new from 01/30/2020. Vascular/Lymphatic: Aortic atherosclerosis. No aneurysm. No abdominopelvic adenopathy identified. Reproductive: Status post hysterectomy. No adnexal masses. Other: No focal fluid collections identified. No signs of pneumoperitoneum. Midline ventral abdominal wall hernia is identified which contains fat only. Musculoskeletal: Previous L5-S1 PLIF with posterior translation of the interbody fusion cage with compression upon the spinal canal. This is unchanged from 01/30/2020. Grade 2 anterolisthesis is identified at this level, unchanged. Chronic fracture deformities involving the right superior and inferior pubic rami noted. IMPRESSION: 1. There is apparent air-filled tract arising off the left lateral wall of the esophagus at the level of the transverse aortic arch and left mainstem bronchus. Although this may reflect lateral deviation of collapsed esophagus perforation cannot be excluded. If there is a clinical concern for esophageal perforation further evaluation with water-soluble esophagram or repeat CT of the chest following ingestion of water-soluble contrast material is advised. 2. Small bilateral pleural effusions with multifocal areas of lower lung zone subsegmental atelectasis 3. Wall  thickening involving the rectosigmoid colon is identified with diffuse perirectal soft tissue stranding/edema. Correlate for any clinical signs or symptoms of inflammatory or proctitis. 4. Aortic Atherosclerosis (ICD10-I70.0) and Emphysema (ICD10-J43.9). 5. Coronary artery calcifications. 6. 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 Chronic bilateral rib fractures and chronic fractures involving the superior and inferior right pubic rami noted. Electronically Signed   By: Signa Kell M.D.   On: 06/19/2020 12:12   DG Chest 1 View  Result Date: 06/12/2020 CLINICAL  DATA:  Shortness of breath EXAM: CHEST  1 VIEW COMPARISON:  January 15, 2020 FINDINGS: There is ill-defined airspace opacity in the right upper lobe. There is scarring in the left base. The heart is enlarged with pulmonary vascularity normal, stable. There is aortic atherosclerosis. Bones are osteoporotic. There is extensive arthropathy in the right shoulder. IMPRESSION: Ill-defined airspace opacity consistent with pneumonia right upper lobe. Scarring left base. Stable cardiac enlargement. Bones osteoporotic. Aortic Atherosclerosis (ICD10-I70.0). Electronically Signed   By: Bretta Bang III M.D.   On: 06/12/2020 14:47   CT HEAD WO CONTRAST  Result Date: 06/18/2020 CLINICAL DATA:  Inpatient. Mental status change after EGD 06/17/2020. Restless. No reported injury. EXAM: CT HEAD WITHOUT CONTRAST TECHNIQUE: Contiguous axial images were obtained from the base of the skull through the vertex without intravenous contrast. COMPARISON:  None. FINDINGS: Brain: Small focus of cortical hypodensity in right occipital lobe (series 2/image 11). Nonspecific moderate subcortical and periventricular white matter hypodensity, most in keeping with chronic small vessel ischemic change. No evidence of parenchymal hemorrhage or extra-axial fluid collection. No mass lesion, mass effect, or midline shift. Cerebral volume is age appropriate. No ventriculomegaly. Vascular: No acute abnormality. Skull: No evidence of calvarial fracture. Sinuses/Orbits: The visualized paranasal sinuses are essentially clear. Other:  The mastoid air cells are unopacified. IMPRESSION: 1. Small focus of cortical hypodensity in the right occipital lobe, cannot exclude acute/subacute infarct. No acute intracranial hemorrhage. MRI brain without and with IV contrast could be considered for further evaluation as clinically warranted. 2. Moderate chronic small vessel ischemic changes in the cerebral white matter. These results will be called to the ordering  clinician or representative by the Radiologist Assistant, and communication documented in the PACS or [employer]. Electronically Signed   By: Delbert Phenix M.D.   On: 06/18/2020 17:53   CT CHEST WO CONTRAST  Result Date: 06/19/2020 CLINICAL DATA:  Follow-up abnormal chest radiograph. Shortness of breath for 1 week. EXAM: CT CHEST, ABDOMEN AND PELVIS WITHOUT CONTRAST TECHNIQUE: Multidetector CT imaging of the chest, abdomen and pelvis was performed following the standard protocol without IV contrast. COMPARISON:  CT AP 01/30/2020 and CTA chest 06/12/2020 FINDINGS: CT CHEST FINDINGS Cardiovascular: Heart size appears mildly enlarged. Aortic atherosclerosis. Coronary artery calcifications. No pericardial effusion. Mediastinum/Nodes: No mediastinal, supraclavicular or axillary adenopathy. There is an apparent small air-filled tract arising off the left lateral wall of the esophagus at the level of the transverse aortic arch chest just proximal to the left mainstem bronchus. This appears to contain a small volume of gas and debris, image 16/2. No significant pneumo mediastinum identified. This finding may reflect collapsed esophagus with lateral deviation versus esophageal perforation status post EGD. The trachea appears patent and is midline. Normal appearance of the thyroid gland. Lungs/Pleura: Small bilateral pleural effusions are identified, new from previous exam. Subsegmental atelectasis is noted within the posteromedial right lower lobe, lingula and left lower lobe. No pneumothorax identified. Mild changes of centrilobular emphysema with diffuse bronchial wall thickening. Musculoskeletal: Multiple  bilateral rib deformities are identified, likely posttraumatic. Unchanged. 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 Similar appearance of vertebra plana deformity at the T9 level. CT ABDOMEN PELVIS FINDINGS Hepatobiliary: No focal liver abnormality. Gallbladder appears  diffusely distended. No signs of gallbladder wall thickening or inflammation. No bile duct dilatation Pancreas: Unremarkable. No pancreatic ductal dilatation or surrounding inflammatory changes. Spleen: Normal in size without focal abnormality. Adrenals/Urinary Tract: Normal appearance of the adrenal glands. No kidney mass or hydronephrosis. Urinary bladder is unremarkable. Stomach/Bowel: Hiatal hernia. The appendix is not confidently identified separate from the right lower quadrant bowel loops. No small bowel wall thickening, inflammation, or distension. A rectal tube is in place. Within the limitations of unenhanced technique there is wall thickening involving the distal sigmoid colon and rectum with diffuse perirectal soft tissue stranding and edema. This appears new from 01/30/2020. Vascular/Lymphatic: Aortic atherosclerosis. No aneurysm. No abdominopelvic adenopathy identified. Reproductive: Status post hysterectomy. No adnexal masses. Other: No focal fluid collections identified. No signs of pneumoperitoneum. Midline ventral abdominal wall hernia is identified which contains fat only. Musculoskeletal: Previous L5-S1 PLIF with posterior translation of the interbody fusion cage with compression upon the spinal canal. This is unchanged from 01/30/2020. Grade 2 anterolisthesis is identified at this level, unchanged. Chronic fracture deformities involving the right superior and inferior pubic rami noted. IMPRESSION: 1. There is apparent air-filled tract arising off the left lateral wall of the esophagus at the level of the transverse aortic arch and left mainstem bronchus. Although this may reflect lateral deviation of collapsed esophagus perforation cannot be excluded. If there is a clinical concern for esophageal perforation further evaluation with water-soluble esophagram or repeat CT of the chest following ingestion of water-soluble contrast material is advised. 2. Small bilateral pleural effusions with  multifocal areas of lower lung zone subsegmental atelectasis 3. Wall thickening involving the rectosigmoid colon is identified with diffuse perirectal soft tissue stranding/edema. Correlate for any clinical signs or symptoms of inflammatory or proctitis. 4. Aortic Atherosclerosis (ICD10-I70.0) and Emphysema (ICD10-J43.9). 5. Coronary artery calcifications. 6. 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 Chronic bilateral rib fractures and chronic fractures involving the superior and inferior right pubic rami noted. Electronically Signed   By: Signa Kellaylor  Stroud M.D.   On: 06/19/2020 12:12   CT CHEST W CONTRAST  Result Date: 06/20/2020 CLINICAL DATA:  EGD yesterday. Unenhanced CT at that time demonstrated what is likely a diverticulum involving the distal esophagus. The CT examination is repeated with IV and oral contrast today. EXAM: CT CHEST WITH CONTRAST TECHNIQUE: Multidetector CT imaging of the chest was performed during intravenous contrast administration. Oral contrast was also administered in order to evaluate the esophagus. CONTRAST:  75mL OMNIPAQUE IOHEXOL 300 MG/ML IV. COMPARISON:  Unenhanced CT chest yesterday. FINDINGS: Beam hardening streak artifact related to the fact that the patient was unable to raise the arms makes the examination less than optimal. Cardiovascular: Heart moderately enlarged. Mitral annular and aortic annular calcifications. Severe three-vessel coronary atherosclerosis. No pericardial effusion. Severe atherosclerosis involving the thoracic and upper abdominal aorta without evidence of aneurysm. Mediastinum/Nodes: Oral contrast within the esophagus. Fluid-filled structure measuring approximately 2 cm adjacent to the distal esophagus; there is no oral contrast within this fluid-filled structure. Remainder of the esophagus mildly distended but otherwise normal in appearance. No pathologic lymphadenopathy. Multiple small thyroid nodules in an upper normal sized to  slightly enlarged gland, the largest measuring approximately 8 mm. Lungs/Pleura: Atelectasis involving the lower lobes bilaterally. More confluent airspace consolidation  is present deep in the RIGHT LOWER LOBE. Mild ground-glass airspace opacity in the lingula. Lungs otherwise clear. No parenchymal nodules or masses. No pleural effusions. Central airways patent without significant bronchial wall thickening. Upper Abdomen: Chronic elevation RIGHT hemidiaphragm. 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 No acute findings. Musculoskeletal: Remote severe compression fracture of T10 with an associated kyphous deformity. Lucent lesion involving the body of the sternum with sclerotic margins and extraosseous extension and/or pathologic fracture. Severe degenerative changes in the shoulder joints. IMPRESSION: 1. 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 There is no oral contrast within this structure. 2. Atelectasis involving the lower lobes bilaterally. More confluent airspace consolidation deep in the RIGHT LOWER LOBE, query superimposed pneumonia. Mild ground-glass airspace opacity in the lingula is also felt to be infectious or inflammatory. 3. Lucent lesion involving the body of the sternum with extraosseous extension and/or pathologic fracture. 4. Moderate cardiomegaly. Severe three-vessel coronary atherosclerosis. 5. Multinodular goiter. The largest nodule is 8 mm. Not clinically significant; no follow-up imaging recommended. (Ref: J Am Coll Radiol. 2015 Feb;12(2): 143-50). 6. Aortic Atherosclerosis (ICD10-I70.0). Electronically Signed   By: Hulan Saas M.D.   On: 06/20/2020 14:26   CT Angio Chest PE W and/or Wo Contrast  Result Date: 06/12/2020 CLINICAL DATA:  80 year old female with shortness of breath. EXAM: CT ANGIOGRAPHY CHEST WITH CONTRAST TECHNIQUE: Multidetector CT imaging of the chest was  performed using the standard protocol during bolus administration of intravenous contrast. Multiplanar CT image reconstructions and MIPs were obtained to evaluate the vascular anatomy. CONTRAST:  Seventy-five mL Omnipaque 350, intravenous COMPARISON:  01/04/2020 FINDINGS: Cardiovascular: Satisfactory opacification of the pulmonary arteries to the segmental level. No evidence of pulmonary embolism. Similar appearing moderate global cardiomegaly. Mitral annular calcifications. Severe coronary atherosclerotic calcifications. Scattered atherosclerotic calcifications of the thoracic aorta. No pericardial effusion. Mediastinum/Nodes: No enlarged mediastinal, hilar, or axillary lymph nodes. The esophagus is patulous throughout with a standing column of enteric contents. Thyroid gland and trachea demonstrate no significant findings. Lungs/Pleura: Bibasilar and lingular subsegmental atelectasis. Mild upper lobe predominant centrilobular emphysema. No suspicious pulmonary nodules. No pleural effusion or pneumothorax. 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 The visualized upper abdomen is otherwise within normal limits. Musculoskeletal: Progressive compression deformity of the T9 vertebral body with associated kyphosis at this level, now nearly vertebra plana morphology. Mild, unchanged retropulsion. 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 Multilevel chronic, bilateral posterolateral nondisplaced rib fractures. Advanced degenerative changes of the glenohumeral joints bilaterally. No acute osseous abnormality. Review of the MIP images confirms the above findings. IMPRESSION: Vascular: 1. No evidence of pulmonary embolism. 2. Unchanged moderate global cardiomegaly. 3. Severe coronary atherosclerotic calcifications. 4.  Aortic Atherosclerosis (ICD10-I70.0). Non-Vascular: 1. Bibasilar and lingular subsegmental atelectasis.  2. Progressive compression deformity of the T9 vertebral body, now essentially vertebra plana. Unchanged mild retropulsion. Marliss Coots, MD [facility] Electronically Signed   By: Marliss Coots MD   On: 06/12/2020 16:45   MR BRAIN WO CONTRAST  Result Date: 06/19/2020 CLINICAL DATA:  Mental status change. EXAM: MRI HEAD WITHOUT CONTRAST TECHNIQUE: Multiplanar, multiecho pulse sequences of the brain and surrounding structures were obtained without intravenous contrast. COMPARISON:  Head CT June 18, 2020 FINDINGS: The study is partially degraded by motion. Brain: No acute infarction, hemorrhage, hydrocephalus, extra-axial collection or mass lesion. Scattered and confluent foci of T2 hyperintensity are seen  within the white matter of the cerebral hemispheres and within the pons, nonspecific, most likely related to chronic small vessel ischemia. 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 Hypodense area described on prior CT corresponds to prominent arachnoid granulation in the right transverse sinus. Prominent vascular susceptibility artifact on the GRE sequence is related to recent administration of ferumoxytol seizure. Vascular: Normal flow voids. Skull and upper cervical spine: Normal marrow signal. Sinuses/Orbits: Negative. Other: None. IMPRESSION: 1. No acute intracranial abnormality. 2. 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 3. Moderate chronic small vessel ischemia. Electronically Signed   By: Baldemar Lenis M.D.   On: 06/19/2020 09:52   US Venous Img Lower  Left (DVT Study)  Result Date: 06/12/2020 CLINICAL DATA:  Lower extremity pain and edema EXAM: LEFT LOWER EXTREMITY VENOUS DUPLEX ULTRASOUND TECHNIQUE: Gray-scale sonography with graded compression, as well as color Doppler and duplex ultrasound were performed to evaluate the left lower extremity deep venous  system from the level of the common femoral vein and including the common femoral, femoral, profunda femoral, popliteal and calf veins including the posterior tibial, peroneal and gastrocnemius veins when visible. The superficial great saphenous vein was also interrogated. Spectral Doppler was utilized to evaluate flow at rest and with distal augmentation maneuvers in the common femoral, femoral and popliteal veins. COMPARISON:  None. FINDINGS: Contralateral Common Femoral Vein: Respiratory phasicity is normal and symmetric with the symptomatic side. No evidence of thrombus. Normal compressibility. Common Femoral Vein: No evidence of thrombus. Normal compressibility, respiratory phasicity and response to augmentation. Saphenofemoral Junction: No evidence of thrombus. Normal compressibility and flow on color Doppler imaging. Profunda Femoral Vein: No evidence of thrombus. Normal compressibility and flow on color Doppler imaging. Femoral Vein: No evidence of thrombus. Normal compressibility, respiratory phasicity and response to augmentation. Popliteal Vein: No evidence of thrombus. Normal compressibility, respiratory phasicity and response to augmentation. Calf Veins: No evidence of thrombus. Normal compressibility and flow on color Doppler imaging. Superficial Great Saphenous Vein: No evidence of thrombus. Normal compressibility. Venous Reflux:  None. Other Findings:  Soft tissue edema noted in the calf region. IMPRESSION: No evidence of deep venous thrombosis in the left lower extremity. Right common femoral vein patent. There is left calf region soft tissue edema. Electronically Signed   By: Bretta Bang III M.D.   On: 06/12/2020 14:46   DG Chest Port 1 View  Result Date: 06/19/2020 CLINICAL DATA:  Central line placement. EXAM: PORTABLE CHEST 1 VIEW COMPARISON:  April 09/30/2020. FINDINGS: Low lung volumes with bibasilar opacities. No visible pleural effusions or pneumothorax on this portable semi rib  upright radiograph. Mild enlargement the cardiac silhouette. Calcific atherosclerosis of the aorta. Right IJ central venous catheter with the tip projecting at the superior cavoatrial junction. Polyarticular degenerative change. IMPRESSION: 1. Right IJ central venous catheter with the tip projecting at the superior cavoatrial junction. 2. Low lung volumes with bibasilar opacities that are better characterized on same day CT chest. Electronically Signed   By: Feliberto Harts MD   On: 06/19/2020 11:15   DG CHEST PORT 1 VIEW  Result Date: 06/18/2020 CLINICAL DATA:  Shortness of breath, hypoxia, history hypertension, smoker EXAM: PORTABLE CHEST 1 VIEW COMPARISON:  Portable exam 1043 hours compared to 06/15/2020 FINDINGS: Upper normal heart size. Mediastinal contours and pulmonary vascularity normal. Atherosclerotic calcification aorta. Bronchitic changes with bibasilar atelectasis. Remaining lungs clear. No pleural effusion or pneumothorax. Bones demineralized with advanced RIGHT glenohumeral degenerative changes.  IMPRESSION: Persistent bronchitic changes and bibasilar atelectasis. Aortic Atherosclerosis (ICD10-I70.0). Electronically Signed   By: Ulyses Southward M.D.   On: 06/18/2020 11:13   DG CHEST PORT 1 VIEW  Result Date: 06/15/2020 CLINICAL DATA:  Shortness of breath, hypertension, former smoker EXAM: PORTABLE CHEST 1 VIEW COMPARISON:  Portable exam 0656 hours compared to 06/12/2020 FINDINGS: Enlargement of cardiac silhouette. Mediastinal contours and pulmonary vascularity normal. Atherosclerotic calcification aorta. Bibasilar atelectasis. No acute infiltrate, pleural effusion, or pneumothorax. Bones demineralized with advanced degenerative changes of the RIGHT glenohumeral joint. IMPRESSION: Enlargement of cardiac silhouette with bibasilar atelectasis. Aortic Atherosclerosis (ICD10-I70.0). Electronically Signed   By: Ulyses Southward M.D.   On: 06/15/2020 08:06   DG Abd 2 Views  Result Date: 06/19/2020 CLINICAL  DATA:  Abdominal pain and altered mental status. EXAM: ABDOMEN - 2 VIEW COMPARISON:  CT abdomen and pelvis 01/30/2020 FINDINGS: Scattered gas and stool in the colon. No definite small or large bowel dilatation although paucity of gas limits evaluation. No free intra-abdominal air. No abnormal air-fluid levels. No radiopaque stones. There is evidence of infiltration or atelectasis in both lung bases. Postoperative changes in the lumbosacral spine. Degenerative changes in the lumbar spine and hips. IMPRESSION: 1. Nonobstructive bowel gas pattern. 2. Infiltration or atelectasis in both lung bases. Electronically Signed   By: Burman Nieves M.D.   On: 06/19/2020 02:10   ECHOCARDIOGRAM COMPLETE  Result Date: 06/13/2020    ECHOCARDIOGRAM REPORT   Patient Name:   ZOWIE LUNDAHL Date of Exam: 06/13/2020 Medical Rec #:  [id number]           Height:       64.0 in Accession #:    [id number]          Weight:       157.0 lb Date of Birth:  01/03/1941           BSA:          1.765 m Patient Age:    79 years            BP:           108/55 mmHg Patient Gender: F                   HR:           91 bpm. [facility] Procedure: 2D Echo Indications:    CHF-Acute Diastolic I50.31  History:        Patient has prior history of Echocardiogram examinations, most                 recent 01/16/2020. Risk Factors:Hypertension, Dyslipidemia and                 Former Smoker.  Sonographer:    Jeryl Columbia RDCS (AE) Referring Phys: [id number] [address] [place] IMPRESSIONS  1. Left ventricular ejection fraction, by estimation, is 55 to 60%. The left ventricle has normal function. The left ventricle has no regional wall motion abnormalities. There is mild concentric left ventricular hypertrophy of the basal-septal segment. Left ventricular diastolic parameters are consistent with Grade I diastolic dysfunction (impaired relaxation).  2. Right ventricular systolic function is normal. The right ventricular size is normal. There is  mildly elevated pulmonary artery systolic pressure. The estimated right ventricular systolic pressure is 42.0 mmHg.  3. The mitral valve is grossly normal. No evidence of mitral valve regurgitation. No evidence of mitral stenosis. Severe mitral annular calcification.  4. The aortic valve was  not well visualized. There is mild calcification of the aortic valve. Aortic valve regurgitation is not visualized. No aortic stenosis is present.  5. The inferior vena cava is dilated in size with <50% respiratory variability, suggesting right atrial pressure of 15 mmHg. Comparison(s): A prior study was performed on 01/16/2020. Left ventricle is less hyperdynamic. FINDINGS  Left Ventricle: Left ventricular ejection fraction, by estimation, is 55 to 60%. The left ventricle has normal function. The left ventricle has no regional wall motion abnormalities. The left ventricular internal cavity size was normal in size. There is  mild concentric left ventricular hypertrophy of the basal-septal segment. Left ventricular diastolic parameters are consistent with Grade I diastolic dysfunction (impaired relaxation). Right Ventricle: The right ventricular size is normal. No increase in right ventricular wall thickness. Right ventricular systolic function is normal. There is mildly elevated pulmonary artery systolic pressure. The tricuspid regurgitant velocity is 2.60  m/s, and with an assumed right atrial pressure of 15 mmHg, the estimated right ventricular systolic pressure is 42.0 mmHg. Left Atrium: Left atrial size was normal in size. Right Atrium: Right atrial size was normal in size. Pericardium: There is no evidence of pericardial effusion. Mitral Valve: The mitral valve is grossly normal. There is mild thickening of the mitral valve leaflet(s). There is moderate calcification of the mitral valve leaflet(s). Severe mitral annular calcification. No evidence of mitral valve regurgitation. No evidence of mitral valve stenosis. MV peak  gradient, 6.7 mmHg. The mean mitral valve gradient is 3.0 mmHg with average heart rate of 98 bpm. Tricuspid Valve: The tricuspid valve is grossly normal. Tricuspid valve regurgitation is trivial. No evidence of tricuspid stenosis. Aortic Valve: The aortic valve was not well visualized. There is mild calcification of the aortic valve. Aortic valve regurgitation is not visualized. No aortic stenosis is present. Pulmonic Valve: The pulmonic valve was not well visualized. Pulmonic valve regurgitation is not visualized. No evidence of pulmonic stenosis. Aorta: The aortic root is normal in size and structure. Venous: The inferior vena cava is dilated in size with less than 50% respiratory variability, suggesting right atrial pressure of 15 mmHg. IAS/Shunts: The atrial septum is grossly normal.  LEFT VENTRICLE PLAX 2D LVIDd:         4.18 cm  Diastology LVIDs:         2.99 cm  LV e' medial:    5.00 cm/s LV PW:         1.40 cm  LV E/e' medial:  18.3 LV IVS:        1.35 cm  LV e' lateral:   4.24 cm/s LVOT diam:     2.10 cm  LV E/e' lateral: 21.5 LVOT Area:     3.46 cm  RIGHT VENTRICLE RV S prime:     14.30 cm/s TAPSE (M-mode): 2.4 cm LEFT ATRIUM             Index       RIGHT ATRIUM           Index LA diam:        4.30 cm 2.44 cm/m  RA Area:     14.40 cm LA Vol (A2C):   52.3 ml 29.63 ml/m RA Volume:   35.60 ml  20.17 ml/m LA Vol (A4C):   48.3 ml 27.37 ml/m LA Biplane Vol: 52.1 ml 29.52 ml/m   AORTA Ao Root diam: 2.50 cm MITRAL VALVE                TRICUSPID VALVE MV  Area (PHT): 3.36 cm     TR Peak grad:   27.0 mmHg MV Peak grad:  6.7 mmHg     TR Vmax:        260.00 cm/s MV Mean grad:  3.0 mmHg MV Vmax:       1.29 m/s     SHUNTS MV Vmean:      87.5 cm/s    Systemic Diam: 2.10 cm MV Decel Time: 226 msec MV E velocity: 91.30 cm/s MV A velocity: 120.00 cm/s MV E/A ratio:  0.76 Riley Lam MD Electronically signed by Riley Lam MD Signature Date/Time: 06/13/2020/3:04:50 PM    Final    US Abdomen Limited  RUQ (LIVER/GB)  Result Date: 06/15/2020 CLINICAL DATA:  Diffuse abdominal pain. 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 EXAM: ULTRASOUND ABDOMEN LIMITED RIGHT UPPER QUADRANT COMPARISON:  Prior CT scan November 2021 FINDINGS: Gallbladder: No gallstones or wall thickening visualized. No sonographic Murphy sign noted by sonographer. Common bile duct: Diameter: Within normal limits at 5-6 mm Liver: No focal lesion identified. Within normal limits in parenchymal echogenicity. Portal vein is patent on color Doppler imaging with normal direction of blood flow towards the liver. Other: None. IMPRESSION: Negative right upper quadrant ultrasound. Electronically Signed   By: Malachy Moan M.D.   On: 06/15/2020 09:55    Catarina Hartshorn, DO  [facility]  If 7PM-7AM, please contact night-coverage [email or web address] Password [facility] 06/23/2020, 5:23 PM   LOS: 11 days

## 2020-06-24 ENCOUNTER — Other Ambulatory Visit (HOSPITAL_COMMUNITY): Payer: Self-pay

## 2020-06-24 DIAGNOSIS — R5381 Other malaise: Secondary | ICD-10-CM

## 2020-06-24 LAB — CBC
HCT: 36.2 % (ref 36.0–46.0)
Hemoglobin: 10.3 g/dL — ABNORMAL LOW (ref 12.0–15.0)
MCH: 26.3 pg (ref 26.0–34.0)
MCHC: 28.5 g/dL — ABNORMAL LOW (ref 30.0–36.0)
MCV: 92.6 fL (ref 80.0–100.0)
Platelets: 399 10*3/uL (ref 150–400)
RBC: 3.91 MIL/uL (ref 3.87–5.11)
RDW: 19.1 % — ABNORMAL HIGH (ref 11.5–15.5)
WBC: 8.8 10*3/uL (ref 4.0–10.5)
nRBC: 0 % (ref 0.0–0.2)

## 2020-06-24 LAB — COMPREHENSIVE METABOLIC PANEL
ALT: 14 U/L (ref 0–44)
AST: 12 U/L — ABNORMAL LOW (ref 15–41)
Albumin: 2.7 g/dL — ABNORMAL LOW (ref 3.5–5.0)
Alkaline Phosphatase: 86 U/L (ref 38–126)
Anion gap: 10 (ref 5–15)
BUN: 19 mg/dL (ref 8–23)
CO2: 23 mmol/L (ref 22–32)
Calcium: 8.4 mg/dL — ABNORMAL LOW (ref 8.9–10.3)
Chloride: 108 mmol/L (ref 98–111)
Creatinine, Ser: 1 mg/dL (ref 0.44–1.00)
GFR, Estimated: 58 mL/min — ABNORMAL LOW (ref >60)
Glucose, Bld: 145 mg/dL — ABNORMAL HIGH (ref 70–99)
Potassium: 3.3 mmol/L — ABNORMAL LOW (ref 3.5–5.1)
Sodium: 141 mmol/L (ref 135–145)
Total Bilirubin: 0.5 mg/dL (ref 0.3–1.2)
Total Protein: 5.9 g/dL — ABNORMAL LOW (ref 6.5–8.1)

## 2020-06-24 LAB — MAGNESIUM: Magnesium: 2.2 mg/dL (ref 1.7–2.4)

## 2020-06-24 MED ORDER — AMLODIPINE BESYLATE 10 MG PO TABS: 0.5000 | ORAL_TABLET | Freq: Every day | ORAL | Status: DC

## 2020-06-24 MED ORDER — METOPROLOL TARTRATE 25 MG PO TABS: 25.0000 mg | ORAL_TABLET | Freq: Two times a day (BID) | ORAL | 3 refills | Status: AC

## 2020-06-24 MED ORDER — NIFEREX PO TABS: 1.0000 | ORAL_TABLET | Freq: Every day | ORAL | 3 refills | Status: AC

## 2020-06-24 MED ORDER — GABAPENTIN 600 MG PO TABS: 600.0000 mg | ORAL_TABLET | Freq: Two times a day (BID) | ORAL | Status: DC

## 2020-06-24 MED ORDER — VANCOMYCIN 50 MG/ML ORAL SOLUTION
125.0000 mg | Freq: Four times a day (QID) | ORAL | 0 refills | Status: DC
Start: 2020-06-24 — End: 2020-07-06

## 2020-06-24 MED ORDER — FUROSEMIDE 20 MG PO TABS: 20.0000 mg | ORAL_TABLET | Freq: Every day | ORAL | 3 refills | Status: DC

## 2020-06-24 NOTE — Progress Notes (Signed)
Daughter notified of patient's discharge. Daughter arrived to take patient home and stated patient was not fit to go home with home health, patient needed to go to SNF. MD notified of daughter's statement. MD called daughter to talk about decision. Daughter took patient home.

## 2020-06-24 NOTE — Plan of Care (Signed)
  Problem: Acute Rehab PT Goals(only PT should resolve) Goal: Pt Will Go Supine/Side To Sit Outcome: Progressing Flowsheets (Taken 06/24/2020 1440) Pt will go Supine/Side to Sit:  with supervision  with modified independence Goal: Patient Will Transfer Sit To/From Stand Outcome: Progressing Flowsheets (Taken 06/24/2020 1440) Patient will transfer sit to/from stand: with supervision Goal: Pt Will Transfer Bed To Chair/Chair To Bed Outcome: Progressing Flowsheets (Taken 06/24/2020 1440) Pt will Transfer Bed to Chair/Chair to Bed: with supervision Goal: Pt Will Ambulate Outcome: Progressing Flowsheets (Taken 06/24/2020 1440) Pt will Ambulate:  50 feet  with supervision  with rolling walker  with min guard assist   2:40 PM, 06/24/20 Ocie Bob, MPT Physical Therapist with [facility] [phone] office [phone] mobile phone

## 2020-06-24 NOTE — TOC Transition Note (Addendum)
Transition of Care [facility]) - CM/SW Discharge Note   Patient Details  Name: Brenda Hopkins MRN: [id number] Date of Birth: [birth year]  [facility] of [facility] CM/SW Contact:  Karn Cassis, LCSW Phone Number: 06/24/2020, 3:33 PM   Clinical Narrative:  Pt d/c today. PT evaluated pt and recommend SNF. LCSW discussed with pt who adamantly refuses. She was reluctant to agree to home health, but did accept. Referral made to [facility] for PT, RN, SW. LCSW attempted to reach daughter to notify her of home health and see if she can pick up pt today, but had to leave voicemail. LCSW spoke with pt's son who states he lives out of state. He reports they are working on [employer] paperwork "for Porfirio Mylar to be able to make all decisions for patient." He indicates they were hoping to do this before pt leaves hospital. Per MD, pt has capacity. LCSW explained to son that even if HCPOA paperwork is signed, as long as pt has capacity family cannot make decisions for her. Son indicates understanding but is concerned about pt returning home. He states LCSW needs to speak to his sister. LCSW called Porfirio Mylar again, but no answer. Pt aware LCSW unable to reach [place] at this time.     Final next level of care: Home w Home Health Services Barriers to Discharge: Barriers Resolved   Patient Goals and CMS Choice Patient states their goals for this hospitalization and ongoing recovery are:: return home      Discharge Placement                  Name of family member notified: Son- Jillyn Hidden and left voicemail for daughter, Porfirio Mylar Patient and family notified of of transfer: 06/24/20  Discharge Plan and Services                DME Arranged: N/A DME Agency: NA       HH Arranged: RN,PT,Social Work [employer] Agency: [facility] ([facility]) Date HH Agency Contacted: 06/24/20 Time HH Agency Contacted: [phone] Representative spoke with at [facility] Agency: Bonita Quin  Social Determinants of Health (SDOH)  Interventions     Readmission Risk Interventions No flowsheet data found.

## 2020-06-24 NOTE — Care Management Important Message (Signed)
Important Message  Patient Details  Name: Brenda Hopkins MRN: [id number] Date of Birth: 07-Jan-1941   Medicare Important Message Given:  Yes     Corey Harold 06/24/2020, 2:42 PM

## 2020-06-24 NOTE — Discharge Summary (Signed)
Physician Discharge Summary  Brenda Hopkins XBD:[id number] DOB: May 13, 1940 DOA: 06/12/2020  PCP: Shawnie Dapper, PA-C  Admit date: 06/12/2020 Discharge date: 06/24/2020  Time spent: 35 minutes  Recommendations for Outpatient Follow-up:  1. Repeat complete metabolic panel to follow electrolytes, renal function and electrolytes 2. Repeat chest x-ray in 4-6 weeks to assure complete resolution of infiltrates 3. Outpatient follow-up gastroenterology service as instructed 4. Outpatient follow-up with community services instructed. 5. See below for further recommendations.   Discharge Diagnoses:  Active Problems:   Hypertension   Hyperlipidemia   GERD (gastroesophageal reflux disease)   AKI (acute kidney injury) (HCC)   Acute respiratory failure with hypoxia (HCC)   Acute on chronic diastolic CHF (congestive heart failure) (HCC)   Abdominal pain   Anemia   Acute metabolic encephalopathy   Hematochezia   SIRS (systemic inflammatory response syndrome) (HCC)   Septic shock (HCC)   Proctitis   Physical deconditioning   Discharge Condition: Stable and improved.  Discharged home with instructions to follow-up with PCP as an outpatient.  Physical therapy recommended skilled nursing facility at discharge, but declined by patient. Home health services have been arranged at time of discharge.  Code status: DNR.  Diet recommendation: Heart healthy/low-sodium diet.  Filed Weights   06/21/20 0409 06/22/20 0440 06/23/20 0429  Weight: 59.6 kg 58.4 kg 59.5 kg    History of present illness:  As per H&P written by Dr. Randol Kern on 06/12/2020 Brenda Hopkins  is a 80 y.o. female, with medical history significant forhypertension, anxiety, marginalulcer, tobacco abuseandchronic pain due to complaints of shortness of breath, reports symptoms going on for last week, reports intense with exertion, and laying flat, as well she does report some epigastric/lower chest pain, does not radiate, as well  she does report worsening lower extremity edema, denies fever, chills, cough, nausea or vomiting, she denies any dysuria or polyuria, reports she was recently seen by her PCP for lower extremity edema which she was started on Lasix for last 4 days, report was told to have A. fib, and referral has been made to cardiologist but she did not get to see them yet, as well she does report history of COPD, but she denies any cough, productive sputum or wheezing. -Was significant for BNP of 1270, chest x-ray significant for vascular congestion, EKG nonacute, troponins non-ACS pattern 71>63, was given IV Lasix, and [facility] hospitalist consulted to admit for CHF.  Hospital Course:  Acute metabolic Encephalopathy -06/18/20--developed acute encephalopathy--somonolence -06/22/20-more awake and conversant, intermittently agitated, pleasantly confused otherwise -06/23/20--more awake--scratched hole in central line -4/7/22ABG 7.461/48/56/33 (0.36) -due to sepsis, AKI and hypoxia -EKG-sinus, nonspecific ST changes -ammonia--23 -B12--205 -check PCT5.85>>7.15 -CT brain--Small focus of cortical hypodensity in the right occipital lobe -MR brain--no acute findings  Acute on chronic diastolic CHF -BNP 1270 >>>589, 282, 198 -NEG 11L since admissionprior to transfer to ICU 4/7 -Continue IV Lasix40 mg daily>>po lasix 06/17/20--now on hold due to hypotension/sepsis -Monitoring I's and O's, daily weight -2D echo--ejection fraction 55-60%,G1DD, no WMA, mild elevated PASP -appreciateconsulting cardiology -CTAchest--negpulmonary embolis;cardiomegaly, severe coronary atherosclerosis, aortic atherosclerosis,.. Bilateral sublingual atelectasis, T9 vertebral compression deformity -discharge on low dose of lasix on daily basis -Instructed to check her weight on daily basis and to follow low-sodium diet.  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 Diff results in her stools, she was started on empiric vanco 4/10 by GI; 5 more days pending at discharged.  ??Esophageal tear-->ruled out -4/8 CT chest--air-filled tract arising off the left lateral wall of the esophagus at the level of the transverse aortic arch and left mainstem bronchus. Although this may reflect lateral deviation of collapsed esophagus perforation cannot be excluded -06/20/20 CT chest with contrast--no perforation of esophagus. 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 Mild ground-glass airspace opacity in the lingula is also felt to be infectious or inflammatory -patient completed antibiotics for presumed lungs infection as an inpatient.  Septic shock -check PCT5.85>>7.15>>2.43 -lactate 1.2 -due to pneumonia andintraabdominal source  -4/7 blood culture--neg to date -4/7 UA--no pyuria -4/9 CT chest--Narrow necked diverticulum arising from the distal esophagus versus mediastinal cyst involving the lower mediastinum.No leak.More confluent airspace consolidation deep in the RIGHT LOWER LOBE, query superimposed pneumonia. Mild ground-glass airspace opacity in the lingula is also felt to be infectious or  inflammatory. -CT chest/abd--as above -sepsis physiology resolved -complete oral vancomycin as an outpatient.  Lobar Pneumonia -4/9/22CT chest as above -patient has completed antibiotic therapy for PNA -no oxygen requirement at discharge  AKI -baseline creatinine 0.6-0.9 -peaking at 1.74>>1.68>>0.81 -due to hypotension, volume depletion -improved and WNL at discharge.  Questionable Atrial Fibrillation -No definitive atrial fibrillation noted on telemetry-->personally reviewed--sinus with PACs -appreciate cardiology input and recommendations -continue b-blocker therapy -outpatient follow up arranged -Follow Electrolytes and maintain low normal range.  Coronary Calcification on CT -has intermittent CP, reproducible, partly due to GI etiology also -troponins flat elevation appreciated -outpt stress per cardiology service recommended  -no anginal symptoms at discharge.  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 -Consulting gastroenterology >>>appreciate input -Ultrasound right upper quadrant negative for cholelithiasis or cholecystitis -Continue PPI -No nausea, no vomiting, no chest pain gastrointestinal exam at time of discharge.  Iron deficiency Anemia -iron saturation 7 -ferritin 12 -feraheme x 1on 06/17/20 -Hemoglobin stable -Repeat CBC to follow trend follow-up visit.  Hyperlipidemia -cencouraged to follow heart healthy diet  Chronic pain syndrome -continue gabapentin -continue home dose oxycodone -PMDP reviewed -Continue outpatient follow-up  Hyperkalemia -improved with kayexalate -Potassium within normal limits at discharge. -Repeat basic metabolic panel follow-up visit to reassess electrolytes trend/stability.  Hypertension -Stable at time of discharge -Continue adjusted dose of amlodipine, low-dose  Lasix and daily metoprolol -Patient instructed to follow low-sodium diet.   Procedures:  See below for x-ray reports  2D echo: 1. Left ventricular ejection fraction, by estimation, is 55 to 60%. The  left ventricle has normal function. The left ventricle has no regional  wall motion abnormalities. There is mild concentric left ventricular  hypertrophy of the basal-septal segment.  Left ventricular diastolic parameters are consistent with Grade I  diastolic dysfunction (impaired relaxation).  2. Right ventricular systolic function is normal. The right ventricular  size is normal. There is mildly elevated pulmonary artery systolic  pressure. The estimated right ventricular systolic pressure is 42.0 mmHg.  3. The mitral valve is  grossly normal. No evidence of mitral valve  regurgitation. No evidence of mitral stenosis. Severe mitral annular  calcification.  4. The aortic valve was not well visualized. There is mild calcification  of the aortic valve. Aortic valve regurgitation is not visualized. No  aortic stenosis is present.  5. The inferior vena cava is dilated in size with <50% respiratory  variability, suggesting right atrial pressure of 15 mmHg.   Comparison(s): A prior study was performed on 01/16/2020. Left ventricle is  less hyperdynamic.   Consultations:  Gastroenterology service  Cardiology service  General surgery  Palliative care  Discharge Exam: Vitals:   06/24/20 0753 06/24/20 0811  BP:  (!) 153/99  Pulse:  75  Resp:    Temp:    SpO2: 95%     General: Afebrile, in no acute distress.  Denies chest pain, no shortness of breath, no nausea, no vomiting.  Following commands appropriately, oriented x3 during examination and would like to go home. Cardiovascular: RRR, S1-S2, no rubs, no gallops. Respiratory: Good air movement bilaterally; no use of accessory muscles.  No requiring oxygen supplementation. Abdomen: Soft, nontender, nondistended, positive  bowel sounds Extremities: No cyanosis or clubbing.  Discharge Instructions   Discharge Instructions    Diet - low sodium heart healthy   Complete by: As directed    Discharge instructions   Complete by: As directed    Take medications as prescribed Maintain adequate hydration Follow heart healthy diet Follow dysphagia 1 diet with thin liquids. Arrange follow-up with PCP in 10 days.     Allergies as of 06/24/2020      Reactions   Prozac [fluoxetine Hcl] Other (See Comments)   Makes her fell crazy   Zoloft [sertraline Hcl]    Made her feel crazy--10/2013   Nicoderm [nicotine] Rash   Site rash      Medication List    STOP taking these medications   celecoxib 100 MG capsule Commonly known as: CELEBREX   lisinopril 10 MG tablet Commonly known as: ZESTRIL   ondansetron 4 MG tablet Commonly known as: ZOFRAN   Xarelto Starter Pack Generic drug: Rivaroxaban Stater Pack (15 mg and 20 mg)     TAKE these medications   amLODipine 10 MG tablet Commonly known as: NORVASC Take 0.5 tablets (5 mg total) by mouth daily. What changed: how much to take   atorvastatin 20 MG tablet Commonly known as: LIPITOR TAKE 1 TABLET BY MOUTH EVERY NIGHT AT BEDTIME   diphenhydrAMINE 25 mg capsule Commonly known as: BENADRYL Take 50 mg by mouth every 6 (six) hours as needed.   furosemide 20 MG tablet Commonly known as: LASIX Take 1 tablet (20 mg total) by mouth daily. Start taking on: June 25, 2020   gabapentin 600 MG tablet Commonly known as: NEURONTIN Take 1 tablet (600 mg total) by mouth 2 (two) times daily. What changed: when to take this   metoprolol tartrate 25 MG tablet Commonly known as: LOPRESSOR Take 1 tablet (25 mg total) by mouth 2 (two) times daily.   Niferex Tabs Take 1 tablet by mouth daily.   oxyCODONE 15 MG immediate release tablet Commonly known as: ROXICODONE Take 15 mg by mouth 4 (four) times daily.   pantoprazole 40 MG tablet Commonly known as:  Protonix Take 1 tablet (40 mg total) by mouth 2 (two) times daily.   potassium chloride SA 20 MEQ tablet Commonly known as: KLOR-CON Take 20 mEq by mouth 2 (two) times daily.   QUEtiapine 200 MG  tablet Commonly known as: SEROQUEL Take 200 mg by mouth at bedtime.   rOPINIRole 3 MG tablet Commonly known as: REQUIP Take 3 mg by mouth at bedtime.   sertraline 25 MG tablet Commonly known as: ZOLOFT Take 25 mg by mouth daily.   sucralfate 1 g tablet Commonly known as: CARAFATE Take 1 tablet (1 g total) by mouth 2 (two) times daily.   vancomycin 50 mg/mL  oral solution Commonly known as: VANCOCIN Take 2.5 mLs (125 mg total) by mouth 4 (four) times daily for 5 days.      Allergies  Allergen Reactions  . Prozac [Fluoxetine Hcl] Other (See Comments)    Makes her fell crazy  . Zoloft [Sertraline Hcl]     Made her feel crazy--10/2013  . Nicoderm [Nicotine] Rash    Site rash    Follow-up Information    Ellsworth Lennox, PA-C Follow up on 07/03/2020.   Specialties: Physician Assistant, Cardiology Why: Cardiology Hospital Follow-up on 07/03/2020 at 2:30 PM.  Contact information: [address] [place] [address] [phone]        Shawnie Dapper, PA-C. Schedule an appointment as soon as possible for a visit in 10 day(s).   Specialties: Physician Assistant, Internal Medicine Contact information: [address] [place] [address] [phone]        Antoine Poche, MD .   Specialty: Cardiology Contact information: [address] [place] [address] [phone]               The results of significant diagnostics from this hospitalization (including imaging, microbiology, ancillary and laboratory) are listed below for reference.    Significant Diagnostic Studies: CT ABDOMEN PELVIS WO CONTRAST  Result Date: 06/19/2020 CLINICAL DATA:  Follow-up abnormal chest radiograph. Shortness of breath for 1 week. EXAM: CT CHEST, ABDOMEN AND  PELVIS WITHOUT CONTRAST TECHNIQUE: Multidetector CT imaging of the chest, abdomen and pelvis was performed following the standard protocol without IV contrast. COMPARISON:  CT AP 01/30/2020 and CTA chest 06/12/2020 FINDINGS: CT CHEST FINDINGS Cardiovascular: Heart size appears mildly enlarged. Aortic atherosclerosis. Coronary artery calcifications. No pericardial effusion. Mediastinum/Nodes: No mediastinal, supraclavicular or axillary adenopathy. There is an apparent small air-filled tract arising off the left lateral wall of the esophagus at the level of the transverse aortic arch chest just proximal to the left mainstem bronchus. This appears to contain a small volume of gas and debris, image 16/2. No significant pneumo mediastinum identified. This finding may reflect collapsed esophagus with lateral deviation versus esophageal perforation status post EGD. The trachea appears patent and is midline. Normal appearance of the thyroid gland. Lungs/Pleura: Small bilateral pleural effusions are identified, new from previous exam. Subsegmental atelectasis is noted within the posteromedial right lower lobe, lingula and left lower lobe. No pneumothorax identified. Mild changes of centrilobular emphysema with diffuse bronchial wall thickening. Musculoskeletal: Multiple bilateral rib deformities are identified, likely posttraumatic. Unchanged. 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 Similar appearance of vertebra plana deformity at the T9 level. CT ABDOMEN PELVIS FINDINGS Hepatobiliary: No focal liver abnormality. Gallbladder appears diffusely distended. No signs of gallbladder wall thickening or inflammation. No bile duct dilatation Pancreas: Unremarkable. No pancreatic ductal dilatation or surrounding inflammatory changes. Spleen: Normal in size without focal abnormality. Adrenals/Urinary Tract: Normal appearance of the adrenal glands. No kidney mass or hydronephrosis. Urinary bladder  is unremarkable. Stomach/Bowel: Hiatal hernia. The appendix is not confidently identified separate from the right lower quadrant bowel loops. No  small bowel wall thickening, inflammation, or distension. A rectal tube is in place. Within the limitations of unenhanced technique there is wall thickening involving the distal sigmoid colon and rectum with diffuse perirectal soft tissue stranding and edema. This appears new from 01/30/2020. Vascular/Lymphatic: Aortic atherosclerosis. No aneurysm. No abdominopelvic adenopathy identified. Reproductive: Status post hysterectomy. No adnexal masses. Other: No focal fluid collections identified. No signs of pneumoperitoneum. Midline ventral abdominal wall hernia is identified which contains fat only. Musculoskeletal: Previous L5-S1 PLIF with posterior translation of the interbody fusion cage with compression upon the spinal canal. This is unchanged from 01/30/2020. Grade 2 anterolisthesis is identified at this level, unchanged. Chronic fracture deformities involving the right superior and inferior pubic rami noted. IMPRESSION: 1. There is apparent air-filled tract arising off the left lateral wall of the esophagus at the level of the transverse aortic arch and left mainstem bronchus. Although this may reflect lateral deviation of collapsed esophagus perforation cannot be excluded. If there is a clinical concern for esophageal perforation further evaluation with water-soluble esophagram or repeat CT of the chest following ingestion of water-soluble contrast material is advised. 2. Small bilateral pleural effusions with multifocal areas of lower lung zone subsegmental atelectasis 3. Wall thickening involving the rectosigmoid colon is identified with diffuse perirectal soft tissue stranding/edema. Correlate for any clinical signs or symptoms of inflammatory or proctitis. 4. Aortic Atherosclerosis (ICD10-I70.0) and Emphysema (ICD10-J43.9). 5. Coronary artery calcifications. 6.  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 Chronic bilateral rib fractures and chronic fractures involving the superior and inferior right pubic rami noted. Electronically Signed   By: Signa Kell M.D.   On: 06/19/2020 12:12   DG Chest 1 View  Result Date: 06/12/2020 CLINICAL DATA:  Shortness of breath EXAM: CHEST  1 VIEW COMPARISON:  January 15, 2020 FINDINGS: There is ill-defined airspace opacity in the right upper lobe. There is scarring in the left base. The heart is enlarged with pulmonary vascularity normal, stable. There is aortic atherosclerosis. Bones are osteoporotic. There is extensive arthropathy in the right shoulder. IMPRESSION: Ill-defined airspace opacity consistent with pneumonia right upper lobe. Scarring left base. Stable cardiac enlargement. Bones osteoporotic. Aortic Atherosclerosis (ICD10-I70.0). Electronically Signed   By: Bretta Bang III M.D.   On: 06/12/2020 14:47   CT HEAD WO CONTRAST  Result Date: 06/18/2020 CLINICAL DATA:  Inpatient. Mental status change after EGD 06/17/2020. Restless. No reported injury. EXAM: CT HEAD WITHOUT CONTRAST TECHNIQUE: Contiguous axial images were obtained from the base of the skull through the vertex without intravenous contrast. COMPARISON:  None. FINDINGS: Brain: Small focus of cortical hypodensity in right occipital lobe (series 2/image 11). Nonspecific moderate subcortical and periventricular white matter hypodensity, most in keeping with chronic small vessel ischemic change. No evidence of parenchymal hemorrhage or extra-axial fluid collection. No mass lesion, mass effect, or midline shift. Cerebral volume is age appropriate. No ventriculomegaly. Vascular: No acute abnormality. Skull: No evidence of calvarial fracture. Sinuses/Orbits: The visualized paranasal sinuses are essentially clear. Other:  The mastoid air cells are unopacified. IMPRESSION: 1. Small focus of cortical hypodensity in the right occipital lobe, cannot  exclude acute/subacute infarct. No acute intracranial hemorrhage. MRI brain without and with IV contrast could be considered for further evaluation as clinically warranted. 2. Moderate chronic small vessel ischemic changes in the cerebral white matter. These results will be called to the ordering clinician or representative by the Radiologist Assistant, and communication documented in the PACS or [employer]. Electronically Signed   By: Delbert Phenix  M.D.   On: 06/18/2020 17:53   CT CHEST WO CONTRAST  Result Date: 06/19/2020 CLINICAL DATA:  Follow-up abnormal chest radiograph. Shortness of breath for 1 week. EXAM: CT CHEST, ABDOMEN AND PELVIS WITHOUT CONTRAST TECHNIQUE: Multidetector CT imaging of the chest, abdomen and pelvis was performed following the standard protocol without IV contrast. COMPARISON:  CT AP 01/30/2020 and CTA chest 06/12/2020 FINDINGS: CT CHEST FINDINGS Cardiovascular: Heart size appears mildly enlarged. Aortic atherosclerosis. Coronary artery calcifications. No pericardial effusion. Mediastinum/Nodes: No mediastinal, supraclavicular or axillary adenopathy. There is an apparent small air-filled tract arising off the left lateral wall of the esophagus at the level of the transverse aortic arch chest just proximal to the left mainstem bronchus. This appears to contain a small volume of gas and debris, image 16/2. No significant pneumo mediastinum identified. This finding may reflect collapsed esophagus with lateral deviation versus esophageal perforation status post EGD. The trachea appears patent and is midline. Normal appearance of the thyroid gland. Lungs/Pleura: Small bilateral pleural effusions are identified, new from previous exam. Subsegmental atelectasis is noted within the posteromedial right lower lobe, lingula and left lower lobe. No pneumothorax identified. Mild changes of centrilobular emphysema with diffuse bronchial wall thickening. Musculoskeletal: Multiple bilateral rib  deformities are identified, likely posttraumatic. Unchanged. 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 Similar appearance of vertebra plana deformity at the T9 level. CT ABDOMEN PELVIS FINDINGS Hepatobiliary: No focal liver abnormality. Gallbladder appears diffusely distended. No signs of gallbladder wall thickening or inflammation. No bile duct dilatation Pancreas: Unremarkable. No pancreatic ductal dilatation or surrounding inflammatory changes. Spleen: Normal in size without focal abnormality. Adrenals/Urinary Tract: Normal appearance of the adrenal glands. No kidney mass or hydronephrosis. Urinary bladder is unremarkable. Stomach/Bowel: Hiatal hernia. The appendix is not confidently identified separate from the right lower quadrant bowel loops. No small bowel wall thickening, inflammation, or distension. A rectal tube is in place. Within the limitations of unenhanced technique there is wall thickening involving the distal sigmoid colon and rectum with diffuse perirectal soft tissue stranding and edema. This appears new from 01/30/2020. Vascular/Lymphatic: Aortic atherosclerosis. No aneurysm. No abdominopelvic adenopathy identified. Reproductive: Status post hysterectomy. No adnexal masses. Other: No focal fluid collections identified. No signs of pneumoperitoneum. Midline ventral abdominal wall hernia is identified which contains fat only. Musculoskeletal: Previous L5-S1 PLIF with posterior translation of the interbody fusion cage with compression upon the spinal canal. This is unchanged from 01/30/2020. Grade 2 anterolisthesis is identified at this level, unchanged. Chronic fracture deformities involving the right superior and inferior pubic rami noted. IMPRESSION: 1. There is apparent air-filled tract arising off the left lateral wall of the esophagus at the level of the transverse aortic arch and left mainstem bronchus. Although this may reflect lateral deviation of  collapsed esophagus perforation cannot be excluded. If there is a clinical concern for esophageal perforation further evaluation with water-soluble esophagram or repeat CT of the chest following ingestion of water-soluble contrast material is advised. 2. Small bilateral pleural effusions with multifocal areas of lower lung zone subsegmental atelectasis 3. Wall thickening involving the rectosigmoid colon is identified with diffuse perirectal soft tissue stranding/edema. Correlate for any clinical signs or symptoms of inflammatory or proctitis. 4. Aortic Atherosclerosis (ICD10-I70.0) and Emphysema (ICD10-J43.9). 5. Coronary artery calcifications. 6. 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 Chronic bilateral rib fractures and chronic fractures involving the superior and inferior right pubic rami noted. Electronically Signed   By: Signa Kell M.D.   On: 06/19/2020 12:12  CT CHEST W CONTRAST  Result Date: 06/20/2020 CLINICAL DATA:  EGD yesterday. Unenhanced CT at that time demonstrated what is likely a diverticulum involving the distal esophagus. The CT examination is repeated with IV and oral contrast today. EXAM: CT CHEST WITH CONTRAST TECHNIQUE: Multidetector CT imaging of the chest was performed during intravenous contrast administration. Oral contrast was also administered in order to evaluate the esophagus. CONTRAST:  75mL OMNIPAQUE IOHEXOL 300 MG/ML IV. COMPARISON:  Unenhanced CT chest yesterday. FINDINGS: Beam hardening streak artifact related to the fact that the patient was unable to raise the arms makes the examination less than optimal. Cardiovascular: Heart moderately enlarged. Mitral annular and aortic annular calcifications. Severe three-vessel coronary atherosclerosis. No pericardial effusion. Severe atherosclerosis involving the thoracic and upper abdominal aorta without evidence of aneurysm. Mediastinum/Nodes: Oral contrast within the esophagus. Fluid-filled structure  measuring approximately 2 cm adjacent to the distal esophagus; there is no oral contrast within this fluid-filled structure. Remainder of the esophagus mildly distended but otherwise normal in appearance. No pathologic lymphadenopathy. Multiple small thyroid nodules in an upper normal sized to slightly enlarged gland, the largest measuring approximately 8 mm. Lungs/Pleura: Atelectasis involving the lower lobes bilaterally. More confluent airspace consolidation is present deep in the RIGHT LOWER LOBE. Mild ground-glass airspace opacity in the lingula. Lungs otherwise clear. No parenchymal nodules or masses. No pleural effusions. Central airways patent without significant bronchial wall thickening. Upper Abdomen: Chronic elevation RIGHT hemidiaphragm. 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 No acute findings. Musculoskeletal: Remote severe compression fracture of T10 with an associated kyphous deformity. Lucent lesion involving the body of the sternum with sclerotic margins and extraosseous extension and/or pathologic fracture. Severe degenerative changes in the shoulder joints. IMPRESSION: 1. 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 There is no oral contrast within this structure. 2. Atelectasis involving the lower lobes bilaterally. More confluent airspace consolidation deep in the RIGHT LOWER LOBE, query superimposed pneumonia. Mild ground-glass airspace opacity in the lingula is also felt to be infectious or inflammatory. 3. Lucent lesion involving the body of the sternum with extraosseous extension and/or pathologic fracture. 4. Moderate cardiomegaly. Severe three-vessel coronary atherosclerosis. 5. Multinodular goiter. The largest nodule is 8 mm. Not clinically significant; no follow-up imaging recommended. (Ref: J Am Coll Radiol. 2015 Feb;12(2): 143-50). 6. Aortic Atherosclerosis (ICD10-I70.0).  Electronically Signed   By: Hulan Saas M.D.   On: 06/20/2020 14:26   CT Angio Chest PE W and/or Wo Contrast  Result Date: 06/12/2020 CLINICAL DATA:  80 year old female with shortness of breath. EXAM: CT ANGIOGRAPHY CHEST WITH CONTRAST TECHNIQUE: Multidetector CT imaging of the chest was performed using the standard protocol during bolus administration of intravenous contrast. Multiplanar CT image reconstructions and MIPs were obtained to evaluate the vascular anatomy. CONTRAST:  Seventy-five mL Omnipaque 350, intravenous COMPARISON:  01/04/2020 FINDINGS: Cardiovascular: Satisfactory opacification of the pulmonary arteries to the segmental level. No evidence of pulmonary embolism. Similar appearing moderate global cardiomegaly. Mitral annular calcifications. Severe coronary atherosclerotic calcifications. Scattered atherosclerotic calcifications of the thoracic aorta. No pericardial effusion. Mediastinum/Nodes: No enlarged mediastinal, hilar, or axillary lymph nodes. The esophagus is patulous throughout with a standing column of enteric contents. Thyroid gland and trachea demonstrate no significant findings. Lungs/Pleura: Bibasilar and lingular subsegmental atelectasis. Mild upper lobe predominant centrilobular emphysema. No suspicious pulmonary nodules. No pleural effusion or pneumothorax. 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 The visualized upper abdomen is otherwise within normal limits. Musculoskeletal: Progressive compression  deformity of the T9 vertebral body with associated kyphosis at this level, now nearly vertebra plana morphology. Mild, unchanged retropulsion. 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 Multilevel chronic, bilateral posterolateral nondisplaced rib fractures. Advanced degenerative changes of the glenohumeral joints bilaterally. No acute osseous abnormality. Review of the MIP  images confirms the above findings. IMPRESSION: Vascular: 1. No evidence of pulmonary embolism. 2. Unchanged moderate global cardiomegaly. 3. Severe coronary atherosclerotic calcifications. 4.  Aortic Atherosclerosis (ICD10-I70.0). Non-Vascular: 1. Bibasilar and lingular subsegmental atelectasis. 2. Progressive compression deformity of the T9 vertebral body, now essentially vertebra plana. Unchanged mild retropulsion. Marliss Coots, MD [facility] Radiology Electronically Signed   By: Marliss Coots MD   On: 06/12/2020 16:45   MR BRAIN WO CONTRAST  Result Date: 06/19/2020 CLINICAL DATA:  Mental status change. EXAM: MRI HEAD WITHOUT CONTRAST TECHNIQUE: Multiplanar, multiecho pulse sequences of the brain and surrounding structures were obtained without intravenous contrast. COMPARISON:  Head CT June 18, 2020 FINDINGS: The study is partially degraded by motion. Brain: No acute infarction, hemorrhage, hydrocephalus, extra-axial collection or mass lesion. Scattered and confluent foci of T2 hyperintensity are seen within the white matter of the cerebral hemispheres and within the pons, nonspecific, most likely related to chronic small vessel ischemia. 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 Hypodense area described on prior CT corresponds to prominent arachnoid granulation in the right transverse sinus. Prominent vascular susceptibility artifact on the GRE sequence is related to recent administration of ferumoxytol seizure. Vascular: Normal flow voids. Skull and upper cervical spine: Normal marrow signal. Sinuses/Orbits: Negative. Other: None. IMPRESSION: 1. No acute intracranial abnormality. 2. 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 3. Moderate chronic small vessel ischemia. Electronically Signed   By: Baldemar Lenis M.D.   On: 06/19/2020 09:52   US Venous Img Lower  Left (DVT  Study)  Result Date: 06/12/2020 CLINICAL DATA:  Lower extremity pain and edema EXAM: LEFT LOWER EXTREMITY VENOUS DUPLEX ULTRASOUND TECHNIQUE: Gray-scale sonography with graded compression, as well as color Doppler and duplex ultrasound were performed to evaluate the left lower extremity deep venous system from the level of the common femoral vein and including the common femoral, femoral, profunda femoral, popliteal and calf veins including the posterior tibial, peroneal and gastrocnemius veins when visible. The superficial great saphenous vein was also interrogated. Spectral Doppler was utilized to evaluate flow at rest and with distal augmentation maneuvers in the common femoral, femoral and popliteal veins. COMPARISON:  None. FINDINGS: Contralateral Common Femoral Vein: Respiratory phasicity is normal and symmetric with the symptomatic side. No evidence of thrombus. Normal compressibility. Common Femoral Vein: No evidence of thrombus. Normal compressibility, respiratory phasicity and response to augmentation. Saphenofemoral Junction: No evidence of thrombus. Normal compressibility and flow on color Doppler imaging. Profunda Femoral Vein: No evidence of thrombus. Normal compressibility and flow on color Doppler imaging. Femoral Vein: No evidence of thrombus. Normal compressibility, respiratory phasicity and response to augmentation. Popliteal Vein: No evidence of thrombus. Normal compressibility, respiratory phasicity and response to augmentation. Calf Veins: No evidence of thrombus. Normal compressibility and flow on color Doppler imaging. Superficial Great Saphenous Vein: No evidence of thrombus. Normal compressibility. Venous Reflux:  None. Other Findings:  Soft tissue edema noted in the calf region. IMPRESSION: No evidence of deep venous thrombosis in the left lower extremity. Right common femoral vein patent. There is left calf region soft  tissue edema. Electronically Signed   By: Bretta Bang III M.D.    On: 06/12/2020 14:46   DG Chest Port 1 View  Result Date: 06/19/2020 CLINICAL DATA:  Central line placement. EXAM: PORTABLE CHEST 1 VIEW COMPARISON:  April 09/30/2020. FINDINGS: Low lung volumes with bibasilar opacities. No visible pleural effusions or pneumothorax on this portable semi rib upright radiograph. Mild enlargement the cardiac silhouette. Calcific atherosclerosis of the aorta. Right IJ central venous catheter with the tip projecting at the superior cavoatrial junction. Polyarticular degenerative change. IMPRESSION: 1. Right IJ central venous catheter with the tip projecting at the superior cavoatrial junction. 2. Low lung volumes with bibasilar opacities that are better characterized on same day CT chest. Electronically Signed   By: Feliberto Harts MD   On: 06/19/2020 11:15   DG CHEST PORT 1 VIEW  Result Date: 06/18/2020 CLINICAL DATA:  Shortness of breath, hypoxia, history hypertension, smoker EXAM: PORTABLE CHEST 1 VIEW COMPARISON:  Portable exam 1043 hours compared to 06/15/2020 FINDINGS: Upper normal heart size. Mediastinal contours and pulmonary vascularity normal. Atherosclerotic calcification aorta. Bronchitic changes with bibasilar atelectasis. Remaining lungs clear. No pleural effusion or pneumothorax. Bones demineralized with advanced RIGHT glenohumeral degenerative changes. IMPRESSION: Persistent bronchitic changes and bibasilar atelectasis. Aortic Atherosclerosis (ICD10-I70.0). Electronically Signed   By: Ulyses Southward M.D.   On: 06/18/2020 11:13   DG CHEST PORT 1 VIEW  Result Date: 06/15/2020 CLINICAL DATA:  Shortness of breath, hypertension, former smoker EXAM: PORTABLE CHEST 1 VIEW COMPARISON:  Portable exam 0656 hours compared to 06/12/2020 FINDINGS: Enlargement of cardiac silhouette. Mediastinal contours and pulmonary vascularity normal. Atherosclerotic calcification aorta. Bibasilar atelectasis. No acute infiltrate, pleural effusion, or pneumothorax. Bones demineralized with  advanced degenerative changes of the RIGHT glenohumeral joint. IMPRESSION: Enlargement of cardiac silhouette with bibasilar atelectasis. Aortic Atherosclerosis (ICD10-I70.0). Electronically Signed   By: Ulyses Southward M.D.   On: 06/15/2020 08:06   DG Abd 2 Views  Result Date: 06/19/2020 CLINICAL DATA:  Abdominal pain and altered mental status. EXAM: ABDOMEN - 2 VIEW COMPARISON:  CT abdomen and pelvis 01/30/2020 FINDINGS: Scattered gas and stool in the colon. No definite small or large bowel dilatation although paucity of gas limits evaluation. No free intra-abdominal air. No abnormal air-fluid levels. No radiopaque stones. There is evidence of infiltration or atelectasis in both lung bases. Postoperative changes in the lumbosacral spine. Degenerative changes in the lumbar spine and hips. IMPRESSION: 1. Nonobstructive bowel gas pattern. 2. Infiltration or atelectasis in both lung bases. Electronically Signed   By: Burman Nieves M.D.   On: 06/19/2020 02:10   ECHOCARDIOGRAM COMPLETE  Result Date: 06/13/2020    ECHOCARDIOGRAM REPORT   Patient Name:   Brenda Hopkins Date of Exam: 06/13/2020 Medical Rec #:  [id number]           Height:       64.0 in Accession #:    [id number]          Weight:       157.0 lb Date of Birth:  May 19, 1940           BSA:          1.765 m Patient Age:    79 years            BP:           108/55 mmHg Patient Gender: F                   HR:  91 bpm. [facility] Procedure: 2D Echo Indications:    CHF-Acute Diastolic I50.31  History:        Patient has prior history of Echocardiogram examinations, most                 recent 01/16/2020. Risk Factors:Hypertension, Dyslipidemia and                 Former Smoker.  Sonographer:    Jeryl Columbia RDCS (AE) Referring Phys: [id number] IMPRESSIONS  1. Left ventricular ejection fraction, by estimation, is 55 to 60%. The left ventricle has normal function. The left ventricle has no regional wall motion  abnormalities. There is mild concentric left ventricular hypertrophy of the basal-septal segment. Left ventricular diastolic parameters are consistent with Grade I diastolic dysfunction (impaired relaxation).  2. Right ventricular systolic function is normal. The right ventricular size is normal. There is mildly elevated pulmonary artery systolic pressure. The estimated right ventricular systolic pressure is 42.0 mmHg.  3. The mitral valve is grossly normal. No evidence of mitral valve regurgitation. No evidence of mitral stenosis. Severe mitral annular calcification.  4. The aortic valve was not well visualized. There is mild calcification of the aortic valve. Aortic valve regurgitation is not visualized. No aortic stenosis is present.  5. The inferior vena cava is dilated in size with <50% respiratory variability, suggesting right atrial pressure of 15 mmHg. Comparison(s): A prior study was performed on 01/16/2020. Left ventricle is less hyperdynamic. FINDINGS  Left Ventricle: Left ventricular ejection fraction, by estimation, is 55 to 60%. The left ventricle has normal function. The left ventricle has no regional wall motion abnormalities. The left ventricular internal cavity size was normal in size. There is  mild concentric left ventricular hypertrophy of the basal-septal segment. Left ventricular diastolic parameters are consistent with Grade I diastolic dysfunction (impaired relaxation). Right Ventricle: The right ventricular size is normal. No increase in right ventricular wall thickness. Right ventricular systolic function is normal. There is mildly elevated pulmonary artery systolic pressure. The tricuspid regurgitant velocity is 2.60  m/s, and with an assumed right atrial pressure of 15 mmHg, the estimated right ventricular systolic pressure is 42.0 mmHg. Left Atrium: Left atrial size was normal in size. Right Atrium: Right atrial size was normal in size. Pericardium: There is no evidence of pericardial  effusion. Mitral Valve: The mitral valve is grossly normal. There is mild thickening of the mitral valve leaflet(s). There is moderate calcification of the mitral valve leaflet(s). Severe mitral annular calcification. No evidence of mitral valve regurgitation. No evidence of mitral valve stenosis. MV peak gradient, 6.7 mmHg. The mean mitral valve gradient is 3.0 mmHg with average heart rate of 98 bpm. Tricuspid Valve: The tricuspid valve is grossly normal. Tricuspid valve regurgitation is trivial. No evidence of tricuspid stenosis. Aortic Valve: The aortic valve was not well visualized. There is mild calcification of the aortic valve. Aortic valve regurgitation is not visualized. No aortic stenosis is present. Pulmonic Valve: The pulmonic valve was not well visualized. Pulmonic valve regurgitation is not visualized. No evidence of pulmonic stenosis. Aorta: The aortic root is normal in size and structure. Venous: The inferior vena cava is dilated in size with less than 50% respiratory variability, suggesting right atrial pressure of 15 mmHg. IAS/Shunts: The atrial septum is grossly normal.  LEFT VENTRICLE PLAX 2D LVIDd:         4.18 cm  Diastology LVIDs:         2.99  cm  LV e' medial:    5.00 cm/s LV PW:         1.40 cm  LV E/e' medial:  18.3 LV IVS:        1.35 cm  LV e' lateral:   4.24 cm/s LVOT diam:     2.10 cm  LV E/e' lateral: 21.5 LVOT Area:     3.46 cm  RIGHT VENTRICLE RV S prime:     14.30 cm/s TAPSE (M-mode): 2.4 cm LEFT ATRIUM             Index       RIGHT ATRIUM           Index LA diam:        4.30 cm 2.44 cm/m  RA Area:     14.40 cm LA Vol (A2C):   52.3 ml 29.63 ml/m RA Volume:   35.60 ml  20.17 ml/m LA Vol (A4C):   48.3 ml 27.37 ml/m LA Biplane Vol: 52.1 ml 29.52 ml/m   AORTA Ao Root diam: 2.50 cm MITRAL VALVE                TRICUSPID VALVE MV Area (PHT): 3.36 cm     TR Peak grad:   27.0 mmHg MV Peak grad:  6.7 mmHg     TR Vmax:        260.00 cm/s MV Mean grad:  3.0 mmHg MV Vmax:       1.29 m/s      SHUNTS MV Vmean:      87.5 cm/s    Systemic Diam: 2.10 cm MV Decel Time: 226 msec MV E velocity: 91.30 cm/s MV A velocity: 120.00 cm/s MV E/A ratio:  0.76 Riley Lam MD Electronically signed by Riley Lam MD Signature Date/Time: 06/13/2020/3:04:50 PM    Final    US Abdomen Limited RUQ (LIVER/GB)  Result Date: 06/15/2020 CLINICAL DATA:  Diffuse abdominal pain. 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 EXAM: ULTRASOUND ABDOMEN LIMITED RIGHT UPPER QUADRANT COMPARISON:  Prior CT scan November 2021 FINDINGS: Gallbladder: No gallstones or wall thickening visualized. No sonographic Murphy sign noted by sonographer. Common bile duct: Diameter: Within normal limits at 5-6 mm Liver: No focal lesion identified. Within normal limits in parenchymal echogenicity. Portal vein is patent on color Doppler imaging with normal direction of blood flow towards the liver. Other: None. IMPRESSION: Negative right upper quadrant ultrasound. Electronically Signed   By: Malachy Moan M.D.   On: 06/15/2020 09:55    Microbiology: Recent Results (from the past 240 hour(s))  Culture, blood (Routine X 2) w Reflex to ID Panel     Status: None   Collection Time: 06/18/20 11:18 AM   Specimen: BLOOD RIGHT HAND  Result Value Ref Range Status   Specimen Description BLOOD RIGHT HAND  Final   Special Requests   Final    Blood Culture results may not be optimal due to an inadequate volume of blood received in culture bottles BOTTLES DRAWN AEROBIC AND ANAEROBIC   Culture   Final    NO GROWTH 5 DAYS Performed at [facility], [address], [place], [place] [address]    Report Status 06/23/2020 FINAL  Final  Culture, blood (Routine X 2) w Reflex to ID Panel     Status: None   Collection Time: 06/18/20 11:18 AM   Specimen: BLOOD RIGHT WRIST  Result Value Ref Range Status   Specimen Description BLOOD RIGHT WRIST  Final   Special  Requests   Final    Blood Culture results may not be  optimal due to an inadequate volume of blood received in culture bottles BOTTLES DRAWN AEROBIC AND ANAEROBIC   Culture   Final    NO GROWTH 5 DAYS Performed at [facility], [address] [place], [place] [address]    Report Status 06/23/2020 FINAL  Final  Culture, Urine     Status: Abnormal   Collection Time: 06/18/20 12:20 PM   Specimen: Urine, Clean Catch  Result Value Ref Range Status   Specimen Description   Final    URINE, CLEAN CATCH Performed at [facility], [address] [place], [place] [address]    Special Requests   Final    NONE Performed at [facility], [address] [place], [place] [address]    Culture (A)  Final    <10,000 COLONIES/mL INSIGNIFICANT GROWTH Performed at [facility], [address] [place], [place] [address]    Report Status 06/20/2020 FINAL  Final  Gastrointestinal Panel by PCR , Stool     Status: None   Collection Time: 06/19/20  5:17 PM   Specimen: STOOL  Result Value Ref Range Status   Campylobacter species NOT DETECTED NOT DETECTED Final   Plesimonas shigelloides NOT DETECTED NOT DETECTED Final   Salmonella species NOT DETECTED NOT DETECTED Final   Yersinia enterocolitica NOT DETECTED NOT DETECTED Final   Vibrio species NOT DETECTED NOT DETECTED Final   Vibrio cholerae NOT DETECTED NOT DETECTED Final   Enteroaggregative E coli (EAEC) NOT DETECTED NOT DETECTED Final   Enteropathogenic E coli (EPEC) NOT DETECTED NOT DETECTED Final   Enterotoxigenic E coli (ETEC) NOT DETECTED NOT DETECTED Final   Shiga like toxin producing E coli (STEC) NOT DETECTED NOT DETECTED Final   Shigella/Enteroinvasive E coli (EIEC) NOT DETECTED NOT DETECTED Final   Cryptosporidium NOT DETECTED NOT DETECTED Final   Cyclospora cayetanensis NOT DETECTED NOT DETECTED Final   Entamoeba histolytica NOT DETECTED NOT DETECTED Final   Giardia lamblia NOT DETECTED NOT DETECTED Final   Adenovirus F40/41 NOT DETECTED NOT DETECTED Final   Astrovirus NOT DETECTED NOT  DETECTED Final   Norovirus GI/GII NOT DETECTED NOT DETECTED Final   Rotavirus A NOT DETECTED NOT DETECTED Final   Sapovirus (I, II, IV, and V) NOT DETECTED NOT DETECTED Final    Comment: Performed at [facility], [address] [place], [place] [address]  C Difficile Quick Screen (NO PCR Reflex)     Status: Abnormal   Collection Time: 06/19/20  5:17 PM   Specimen: STOOL  Result Value Ref Range Status   C Diff antigen POSITIVE (A) NEGATIVE Final   C Diff toxin NEGATIVE NEGATIVE Final   C Diff interpretation   Final    Results are indeterminate. Please contact the provider listed for your campus for C diff questions in AMION.    Comment: Performed at [facility], [address] [place], [place] [address]  C. Diff by PCR     Status: Abnormal   Collection Time: 06/21/20  8:52 PM   Specimen: STOOL  Result Value Ref Range Status   Toxigenic C. Difficile by PCR POSITIVE (A) NEGATIVE Final    Comment: Positive for toxigenic C. difficile with little to no toxin production. Only treat if clinical presentation suggests symptomatic illness. Performed at [facility], [address] [place], [place] [address]      Labs: Basic Metabolic Panel: Recent Labs  Lab 06/20/20  0448 06/21/20 1017 06/22/20 0435 06/23/20 0436 06/24/20 0425  NA 141 139 141 140 141  K 3.4* 3.0* 2.8* 3.4* 3.3*  CL 102 101 102 104 108  CO2 32 GLUCOSE 75 153* 94 95 145*  BUN CREATININE 0.81 0.79 0.66 0.67 1.00  CALCIUM 8.0* 8.4* 8.3* 8.3* 8.4*  MG 1.8 1.5* 1.5* 1.7 2.2   Liver Function Tests: Recent Labs  Lab 06/20/20 0448 06/21/20 1017 06/22/20 0435 06/23/20 0436 06/24/20 0425  AST 12*  ALT ALKPHOS 95 109 101 86 86  BILITOT 0.8 0.8 0.7 0.8 0.5  PROT 4.7* 6.2* 6.0* 5.7* 5.9*  ALBUMIN 2.1* 2.8* 2.6* 2.6* 2.7*   Recent Labs  Lab 06/19/20 0145  LIPASE 19  AMYLASE 72   Recent Labs  Lab 06/18/20 1118  AMMONIA 23    CBC: Recent Labs  Lab 06/18/20 0505 06/18/20 1118 06/19/20 0145 06/20/20 0448 06/21/20 1017 06/22/20 0435 06/23/20 0436 06/24/20 0425  WBC 19.5*   < > 15.2* 10.0 9.8 9.9 7.4 8.8  NEUTROABS 17.3*  --  13.1* 8.5*  --   --   --   --   HGB 10.2*   < > 9.3* 8.3* 10.1* 9.9* 10.1* 10.3*  HCT 34.5*   < > 30.9* 29.0* 34.1* 34.3* 34.1* 36.2  MCV 89.1   < > 87.3 91.2 89.7 89.8 90.0 92.6  PLT 299   < > 291 242 311 349 334 399   < > = values in this interval not displayed.    BNP (last 3 results) Recent Labs    06/15/20 0441 06/16/20 0454 06/17/20 0648  BNP 282.0* 198.0* 167.0*   CBG: Recent Labs  Lab 06/19/20 1131 06/20/20 0754 06/20/20 0814 06/20/20 1140 06/20/20 1642  GLUCAP 89 69* 141* 71 85    Signed:  Vassie Loll MD.  [facility] 06/24/2020, 2:20 PM

## 2020-06-24 NOTE — Progress Notes (Signed)
Daughter arrived to take patient home. Daughter expressed concern over taking patient home and stated she did not feel it was appropriate for patient to be going home. Educated daughter and patient on referral for home care being made. Daughter was still adamant on patient not being able to go home. RN, Dr. Gwenlyn Perking, and Administrative coordinator made aware. Dr. Gwenlyn Perking spoke to daughter over the phone. Daughter agreeable to take patient home. Discharge instructions provided to daughter and patient. Prescriptions present in patient discharge packet. Patient experience card given to daughter. No further concerns expressed.

## 2020-06-24 NOTE — Evaluation (Signed)
Physical Therapy Evaluation Patient Details Name: Brenda Hopkins MRN: [id number] DOB: 05-28-1940 Today's Date: 06/24/2020   History of Present Illness  Brenda Hopkins  is a 80 y.o. female, with medical history significant for hypertension, anxiety, marginal ulcer, tobacco abuse and chronic pain due to complaints of shortness of breath, reports symptoms going on for last week, reports intense with exertion, and laying flat, as well she does report some epigastric/lower chest pain, does not radiate, as well she does report worsening lower extremity edema, denies fever, chills, cough, nausea or vomiting, she denies any dysuria or polyuria, reports she was recently seen by her PCP for lower extremity edema which she was started on Lasix for last 4 days, report was told to have A. fib, and referral has been made to cardiologist but she did not get to see them yet, as well she does report history of COPD, but she denies any cough, productive sputum or wheezing.  -Was significant for BNP of 1270, chest x-ray significant for vascular congestion, EKG nonacute, troponins non-ACS pattern 71>63, was given IV Lasix, and [facility] hospitalist consulted to admit for CHF.    Clinical Impression  Patient demonstrates slow labored movement for sitting up at bedside requiring use of bed rail, impulsive due to having to go to bathroom, transferred to [facility] with Min assist stand pivot, once seated on BSC tried to scoot to bathroom while seated on BSC, patient required redirection/instruction in safety and able to use RW to walk to bathroom to finish bowel movement on commode.  Patient slightly agitated and limited to walking in room due to c/o fatigue.  Patient tolerated sitting up in chair after therapy - nursing staff aware.  Patient will benefit from continued physical therapy in hospital and recommended venue below to increase strength, balance, endurance for safe ADLs and gait.     Follow Up Recommendations  SNF;Supervision for mobility/OOB;Supervision - Intermittent    Equipment Recommendations  None recommended by PT    Recommendations for Other Services       Precautions / Restrictions Precautions Precautions: Fall Restrictions Weight Bearing Restrictions: No      Mobility  Bed Mobility Overal bed mobility: Needs Assistance Bed Mobility: Supine to Sit     Supine to sit: Min assist     General bed mobility comments: increased time, labored movement    Transfers Overall transfer level: Needs assistance Equipment used: Rolling walker (2 wheeled);1 person hand held assist Transfers: Sit to/from [employer] Sit to Stand: Min assist Stand pivot transfers: Min assist       General transfer comment: increased time, labored movement, impulsive behavior mostly due to having to go to bathroom  Ambulation/Gait Ambulation/Gait assistance: Min assist Gait Distance (Feet): 15 Feet Assistive device: Rolling walker (2 wheeled) Gait Pattern/deviations: Decreased step length - left;Decreased stance time - right;Decreased stride length Gait velocity: decreased   General Gait Details: slow labored cadence without loss of balance, limited mostly due to fatigue and mild agitation  Stairs            Wheelchair Mobility    Modified Rankin (Stroke Patients Only)       Balance Overall balance assessment: Needs assistance Sitting-balance support: Feet supported;No upper extremity supported Sitting balance-Leahy Scale: Good Sitting balance - Comments: seated at EOB   Standing balance support: During functional activity;Bilateral upper extremity supported Standing balance-Leahy Scale: Fair Standing balance comment: using RW  Pertinent Vitals/Pain Pain Assessment: 0-10 Pain Score: 8  Pain Location: generalized pain all over body Pain Descriptors / Indicators: Aching Pain Intervention(s): Limited activity within  patient's tolerance;Monitored during session    Home Living Family/patient expects to be discharged to:: Private residence Living Arrangements: Alone Available Help at Discharge: Family;Available PRN/intermittently Type of Home: House Home Access: Stairs to enter Entrance Stairs-Rails: Right;Left;Can reach both Entrance Stairs-Number of Steps: 3 Home Layout: One level Home Equipment: Walker - 2 wheels;Cane - single point;Shower seat;Bedside commode;Wheelchair - manual      Prior Function Level of Independence: Needs assistance   Gait / Transfers Assistance Needed: household ambulator using RW  ADL's / Homemaking Assistance Needed: Independent with basic ADL, daughter assists with groceries        Hand Dominance        Extremity/Trunk Assessment   Upper Extremity Assessment Upper Extremity Assessment: Generalized weakness    Lower Extremity Assessment Lower Extremity Assessment: Generalized weakness    Cervical / Trunk Assessment Cervical / Trunk Assessment: Normal  Communication   Communication: No difficulties  Cognition Arousal/Alertness: Awake/alert Behavior During Therapy: WFL for tasks assessed/performed Overall Cognitive Status: No family/caregiver present to determine baseline cognitive functioning                                        General Comments      Exercises     Assessment/Plan    PT Assessment Patient needs continued PT services  PT Problem List Decreased strength;Decreased activity tolerance;Decreased balance;Decreased mobility       PT Treatment Interventions DME instruction;Gait training;Stair training;Functional mobility training;Therapeutic activities;Therapeutic exercise;Patient/family education;Balance training    PT Goals (Current goals can be found in the Care Plan section)  Acute Rehab PT Goals Patient Stated Goal: return home with family to assist PT Goal Formulation: With patient Time For Goal Achievement:  07/08/20 Potential to Achieve Goals: Good    Frequency Min 3X/week   Barriers to discharge        Co-evaluation               AM-PAC PT "6 Clicks" Mobility  Outcome Measure Help needed turning from your back to your side while in a flat bed without using bedrails?: None Help needed moving from lying on your back to sitting on the side of a flat bed without using bedrails?: A Little Help needed moving to and from a bed to a chair (including a wheelchair)?: A Little Help needed standing up from a chair using your arms (e.g., wheelchair or bedside chair)?: A Little Help needed to walk in hospital room?: A Lot Help needed climbing 3-5 steps with a railing? : A Lot 6 Click Score: 17    End of Session   Activity Tolerance: Patient tolerated treatment well;Patient limited by fatigue Patient left: in chair;with call bell/phone within reach;with chair alarm set Nurse Communication: Mobility status PT Visit Diagnosis: Unsteadiness on feet (R26.81);Other abnormalities of gait and mobility (R26.89);Muscle weakness (generalized) (M62.81)    Time: 2671-2458 PT Time Calculation (min) (ACUTE ONLY): 32 min   Charges:   PT Evaluation $PT Eval Moderate Complexity: 1 Mod PT Treatments $Therapeutic Activity: 23-37 mins        2:38 PM, 06/24/20 Ocie Bob, MPT Physical Therapist with [facility] [phone] office [phone] mobile phone

## 2020-06-24 NOTE — Progress Notes (Signed)
Palliative:  HPI: 80 y.o. female  with past medical history of hypertension, anxiety, marginal ulcer, COPD, tobacco abuse, chronic pain, history of gastric bypass admitted on 06/12/2020 with shortness of breath x 1 week worse when lying down or with exertion. Also with epigastric/lower chest pain and lower extremity edema recently began Lasix with new atrial fibrillation and referral to cardiology (not seen yet prior to admission). Found to have acute on chronic diastolic heart failure and atrial fibrillation and cardiology assisted with diuresis. 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 Complication early am 07/20/08 with acute rectal bleeding, hypotension, transfer to ICU, less repsonsive. Noted to have worsening renal function and significant leukocytosis.   I met today at Brenda Hopkins's bedside. She is alert and able to speak with me. She complains of vague generalized discomfort and is restless and cannot get comfortable. She reports that she is feeling better but does not feel good. She is hopeful for further improvement.   No family at bedside throughout the day. I attempted to call daughter, Brenda Hopkins, for additional support but no answer. Will make another attempt tomorrow. No urgent palliative needs.   Emotional support provided. Discussed with RN and Dr. Carles Collet.   Exam: Alert, interactive, confused. Restless. Breathing regular, unlabored. Abd soft, non tender. Moves all extremities.   Plan: - Hopeful for improvement.  - I will be available for additional support as needed.   15 min  Vinie Sill, NP Palliative Medicine Team Pager [phone] (Please see [email or web address] for schedule) Team Phone [phone]    Greater than 50%  of this time was spent counseling and coordinating care related to the above assessment and plan

## 2020-06-25 ENCOUNTER — Other Ambulatory Visit: Payer: Self-pay

## 2020-06-25 DIAGNOSIS — J9811 Atelectasis: Secondary | ICD-10-CM | POA: Diagnosis not present

## 2020-06-25 DIAGNOSIS — K9189 Other postprocedural complications and disorders of digestive system: Secondary | ICD-10-CM | POA: Diagnosis not present

## 2020-06-25 DIAGNOSIS — I700 Atherosclerosis of aorta: Secondary | ICD-10-CM | POA: Diagnosis not present

## 2020-06-25 DIAGNOSIS — I5033 Acute on chronic diastolic (congestive) heart failure: Secondary | ICD-10-CM | POA: Diagnosis not present

## 2020-06-25 DIAGNOSIS — J9601 Acute respiratory failure with hypoxia: Secondary | ICD-10-CM | POA: Diagnosis not present

## 2020-06-25 DIAGNOSIS — M549 Dorsalgia, unspecified: Secondary | ICD-10-CM | POA: Diagnosis not present

## 2020-06-25 DIAGNOSIS — I110 Hypertensive heart disease with heart failure: Secondary | ICD-10-CM | POA: Diagnosis not present

## 2020-06-25 DIAGNOSIS — I2510 Atherosclerotic heart disease of native coronary artery without angina pectoris: Secondary | ICD-10-CM | POA: Diagnosis not present

## 2020-06-25 DIAGNOSIS — I2584 Coronary atherosclerosis due to calcified coronary lesion: Secondary | ICD-10-CM | POA: Diagnosis not present

## 2020-06-25 DIAGNOSIS — J439 Emphysema, unspecified: Secondary | ICD-10-CM | POA: Diagnosis not present

## 2020-06-25 DIAGNOSIS — K289 Gastrojejunal ulcer, unspecified as acute or chronic, without hemorrhage or perforation: Secondary | ICD-10-CM | POA: Diagnosis not present

## 2020-06-25 DIAGNOSIS — G9341 Metabolic encephalopathy: Secondary | ICD-10-CM | POA: Diagnosis not present

## 2020-06-27 DIAGNOSIS — G9341 Metabolic encephalopathy: Secondary | ICD-10-CM | POA: Diagnosis not present

## 2020-06-27 DIAGNOSIS — J439 Emphysema, unspecified: Secondary | ICD-10-CM | POA: Diagnosis not present

## 2020-06-27 DIAGNOSIS — K9189 Other postprocedural complications and disorders of digestive system: Secondary | ICD-10-CM | POA: Diagnosis not present

## 2020-06-27 DIAGNOSIS — I700 Atherosclerosis of aorta: Secondary | ICD-10-CM | POA: Diagnosis not present

## 2020-06-27 DIAGNOSIS — J9811 Atelectasis: Secondary | ICD-10-CM | POA: Diagnosis not present

## 2020-06-27 DIAGNOSIS — J9601 Acute respiratory failure with hypoxia: Secondary | ICD-10-CM | POA: Diagnosis not present

## 2020-06-27 DIAGNOSIS — I2510 Atherosclerotic heart disease of native coronary artery without angina pectoris: Secondary | ICD-10-CM | POA: Diagnosis not present

## 2020-06-27 DIAGNOSIS — I110 Hypertensive heart disease with heart failure: Secondary | ICD-10-CM | POA: Diagnosis not present

## 2020-06-27 DIAGNOSIS — I2584 Coronary atherosclerosis due to calcified coronary lesion: Secondary | ICD-10-CM | POA: Diagnosis not present

## 2020-06-27 DIAGNOSIS — M549 Dorsalgia, unspecified: Secondary | ICD-10-CM | POA: Diagnosis not present

## 2020-06-27 DIAGNOSIS — I5033 Acute on chronic diastolic (congestive) heart failure: Secondary | ICD-10-CM | POA: Diagnosis not present

## 2020-06-27 DIAGNOSIS — K289 Gastrojejunal ulcer, unspecified as acute or chronic, without hemorrhage or perforation: Secondary | ICD-10-CM | POA: Diagnosis not present

## 2020-06-29 ENCOUNTER — Emergency Department (HOSPITAL_COMMUNITY): Payer: Medicare Other

## 2020-06-29 ENCOUNTER — Other Ambulatory Visit: Payer: Self-pay | Admitting: *Deleted

## 2020-06-29 ENCOUNTER — Inpatient Hospital Stay (HOSPITAL_COMMUNITY)
Admission: EM | Admit: 2020-06-29 | Discharge: 2020-07-06 | DRG: 871 | Disposition: A | Discharge status: Skilled Nursing Facility | Payer: Medicare Other | Attending: Internal Medicine | Admitting: Internal Medicine

## 2020-06-29 ENCOUNTER — Other Ambulatory Visit: Payer: Self-pay

## 2020-06-29 DIAGNOSIS — R0689 Other abnormalities of breathing: Secondary | ICD-10-CM

## 2020-06-29 DIAGNOSIS — K668 Other specified disorders of peritoneum: Secondary | ICD-10-CM

## 2020-06-29 DIAGNOSIS — Z90710 Acquired absence of both cervix and uterus: Secondary | ICD-10-CM

## 2020-06-29 DIAGNOSIS — Z9884 Bariatric surgery status: Secondary | ICD-10-CM | POA: Diagnosis not present

## 2020-06-29 DIAGNOSIS — K289 Gastrojejunal ulcer, unspecified as acute or chronic, without hemorrhage or perforation: Secondary | ICD-10-CM | POA: Diagnosis not present

## 2020-06-29 DIAGNOSIS — K9189 Other postprocedural complications and disorders of digestive system: Secondary | ICD-10-CM | POA: Diagnosis not present

## 2020-06-29 DIAGNOSIS — K219 Gastro-esophageal reflux disease without esophagitis: Secondary | ICD-10-CM | POA: Diagnosis present

## 2020-06-29 DIAGNOSIS — R4182 Altered mental status, unspecified: Secondary | ICD-10-CM | POA: Diagnosis not present

## 2020-06-29 DIAGNOSIS — Z20822 Contact with and (suspected) exposure to covid-19: Secondary | ICD-10-CM | POA: Diagnosis present

## 2020-06-29 DIAGNOSIS — J9601 Acute respiratory failure with hypoxia: Secondary | ICD-10-CM | POA: Diagnosis not present

## 2020-06-29 DIAGNOSIS — N179 Acute kidney failure, unspecified: Secondary | ICD-10-CM | POA: Diagnosis not present

## 2020-06-29 DIAGNOSIS — I708 Atherosclerosis of other arteries: Secondary | ICD-10-CM | POA: Diagnosis not present

## 2020-06-29 DIAGNOSIS — A0471 Enterocolitis due to Clostridium difficile, recurrent: Secondary | ICD-10-CM | POA: Diagnosis present

## 2020-06-29 DIAGNOSIS — D6489 Other specified anemias: Secondary | ICD-10-CM | POA: Diagnosis present

## 2020-06-29 DIAGNOSIS — Z66 Do not resuscitate: Secondary | ICD-10-CM | POA: Diagnosis not present

## 2020-06-29 DIAGNOSIS — A419 Sepsis, unspecified organism: Secondary | ICD-10-CM | POA: Diagnosis not present

## 2020-06-29 DIAGNOSIS — E8809 Other disorders of plasma-protein metabolism, not elsewhere classified: Secondary | ICD-10-CM | POA: Diagnosis not present

## 2020-06-29 DIAGNOSIS — I110 Hypertensive heart disease with heart failure: Secondary | ICD-10-CM | POA: Diagnosis present

## 2020-06-29 DIAGNOSIS — R278 Other lack of coordination: Secondary | ICD-10-CM | POA: Diagnosis not present

## 2020-06-29 DIAGNOSIS — R6510 Systemic inflammatory response syndrome (SIRS) of non-infectious origin without acute organ dysfunction: Secondary | ICD-10-CM | POA: Diagnosis not present

## 2020-06-29 DIAGNOSIS — M549 Dorsalgia, unspecified: Secondary | ICD-10-CM | POA: Diagnosis not present

## 2020-06-29 DIAGNOSIS — Z743 Need for continuous supervision: Secondary | ICD-10-CM | POA: Diagnosis not present

## 2020-06-29 DIAGNOSIS — R0902 Hypoxemia: Secondary | ICD-10-CM | POA: Diagnosis not present

## 2020-06-29 DIAGNOSIS — K5730 Diverticulosis of large intestine without perforation or abscess without bleeding: Secondary | ICD-10-CM | POA: Diagnosis not present

## 2020-06-29 DIAGNOSIS — R112 Nausea with vomiting, unspecified: Secondary | ICD-10-CM | POA: Diagnosis not present

## 2020-06-29 DIAGNOSIS — W19XXXA Unspecified fall, initial encounter: Secondary | ICD-10-CM | POA: Diagnosis not present

## 2020-06-29 DIAGNOSIS — I10 Essential (primary) hypertension: Secondary | ICD-10-CM | POA: Diagnosis not present

## 2020-06-29 DIAGNOSIS — E876 Hypokalemia: Secondary | ICD-10-CM | POA: Diagnosis not present

## 2020-06-29 DIAGNOSIS — I2584 Coronary atherosclerosis due to calcified coronary lesion: Secondary | ICD-10-CM | POA: Diagnosis not present

## 2020-06-29 DIAGNOSIS — I700 Atherosclerosis of aorta: Secondary | ICD-10-CM | POA: Diagnosis not present

## 2020-06-29 DIAGNOSIS — E785 Hyperlipidemia, unspecified: Secondary | ICD-10-CM | POA: Diagnosis not present

## 2020-06-29 DIAGNOSIS — J9811 Atelectasis: Secondary | ICD-10-CM | POA: Diagnosis not present

## 2020-06-29 DIAGNOSIS — K439 Ventral hernia without obstruction or gangrene: Secondary | ICD-10-CM | POA: Diagnosis not present

## 2020-06-29 DIAGNOSIS — R296 Repeated falls: Secondary | ICD-10-CM | POA: Diagnosis not present

## 2020-06-29 DIAGNOSIS — A4159 Other Gram-negative sepsis: Principal | ICD-10-CM | POA: Diagnosis present

## 2020-06-29 DIAGNOSIS — R2689 Other abnormalities of gait and mobility: Secondary | ICD-10-CM | POA: Diagnosis not present

## 2020-06-29 DIAGNOSIS — M6281 Muscle weakness (generalized): Secondary | ICD-10-CM | POA: Diagnosis not present

## 2020-06-29 DIAGNOSIS — G9341 Metabolic encephalopathy: Secondary | ICD-10-CM | POA: Diagnosis not present

## 2020-06-29 DIAGNOSIS — Z87891 Personal history of nicotine dependence: Secondary | ICD-10-CM | POA: Diagnosis not present

## 2020-06-29 DIAGNOSIS — R079 Chest pain, unspecified: Secondary | ICD-10-CM | POA: Diagnosis not present

## 2020-06-29 DIAGNOSIS — I5033 Acute on chronic diastolic (congestive) heart failure: Secondary | ICD-10-CM | POA: Diagnosis not present

## 2020-06-29 DIAGNOSIS — I6529 Occlusion and stenosis of unspecified carotid artery: Secondary | ICD-10-CM | POA: Diagnosis not present

## 2020-06-29 DIAGNOSIS — I517 Cardiomegaly: Secondary | ICD-10-CM | POA: Diagnosis not present

## 2020-06-29 DIAGNOSIS — J439 Emphysema, unspecified: Secondary | ICD-10-CM | POA: Diagnosis not present

## 2020-06-29 DIAGNOSIS — N390 Urinary tract infection, site not specified: Secondary | ICD-10-CM | POA: Diagnosis present

## 2020-06-29 DIAGNOSIS — I6381 Other cerebral infarction due to occlusion or stenosis of small artery: Secondary | ICD-10-CM | POA: Diagnosis not present

## 2020-06-29 DIAGNOSIS — N2889 Other specified disorders of kidney and ureter: Secondary | ICD-10-CM | POA: Diagnosis not present

## 2020-06-29 DIAGNOSIS — Z7401 Bed confinement status: Secondary | ICD-10-CM | POA: Diagnosis not present

## 2020-06-29 DIAGNOSIS — G319 Degenerative disease of nervous system, unspecified: Secondary | ICD-10-CM | POA: Diagnosis not present

## 2020-06-29 DIAGNOSIS — R404 Transient alteration of awareness: Secondary | ICD-10-CM | POA: Diagnosis not present

## 2020-06-29 DIAGNOSIS — I2510 Atherosclerotic heart disease of native coronary artery without angina pectoris: Secondary | ICD-10-CM | POA: Diagnosis not present

## 2020-06-29 DIAGNOSIS — M545 Low back pain, unspecified: Secondary | ICD-10-CM | POA: Diagnosis not present

## 2020-06-29 DIAGNOSIS — R0600 Dyspnea, unspecified: Secondary | ICD-10-CM

## 2020-06-29 LAB — CBC WITH DIFFERENTIAL/PLATELET
Abs Immature Granulocytes: 0.18 10*3/uL — ABNORMAL HIGH (ref 0.00–0.07)
Basophils Absolute: 0 10*3/uL (ref 0.0–0.1)
Basophils Relative: 0 %
Eosinophils Absolute: 0.3 10*3/uL (ref 0.0–0.5)
Eosinophils Relative: 2 %
HCT: 31.7 % — ABNORMAL LOW (ref 36.0–46.0)
Hemoglobin: 9.4 g/dL — ABNORMAL LOW (ref 12.0–15.0)
Immature Granulocytes: 1 %
Lymphocytes Relative: 8 %
Lymphs Abs: 1.2 10*3/uL (ref 0.7–4.0)
MCH: 27.3 pg (ref 26.0–34.0)
MCHC: 29.7 g/dL — ABNORMAL LOW (ref 30.0–36.0)
MCV: 92.2 fL (ref 80.0–100.0)
Monocytes Absolute: 0.7 10*3/uL (ref 0.1–1.0)
Monocytes Relative: 5 %
Neutro Abs: 12.5 10*3/uL — ABNORMAL HIGH (ref 1.7–7.7)
Neutrophils Relative %: 84 %
Platelets: 638 10*3/uL — ABNORMAL HIGH (ref 150–400)
RBC: 3.44 MIL/uL — ABNORMAL LOW (ref 3.87–5.11)
RDW: 20.3 % — ABNORMAL HIGH (ref 11.5–15.5)
WBC: 14.9 10*3/uL — ABNORMAL HIGH (ref 4.0–10.5)
nRBC: 0 % (ref 0.0–0.2)

## 2020-06-29 LAB — COMPREHENSIVE METABOLIC PANEL
ALT: 39 U/L (ref 0–44)
AST: 89 U/L — ABNORMAL HIGH (ref 15–41)
Albumin: 2.4 g/dL — ABNORMAL LOW (ref 3.5–5.0)
Alkaline Phosphatase: 80 U/L (ref 38–126)
Anion gap: 8 (ref 5–15)
BUN: 54 mg/dL — ABNORMAL HIGH (ref 8–23)
CO2: 21 mmol/L — ABNORMAL LOW (ref 22–32)
Calcium: 8.2 mg/dL — ABNORMAL LOW (ref 8.9–10.3)
Chloride: 109 mmol/L (ref 98–111)
Creatinine, Ser: 2.21 mg/dL — ABNORMAL HIGH (ref 0.44–1.00)
GFR, Estimated: 22 mL/min — ABNORMAL LOW (ref >60)
Glucose, Bld: 128 mg/dL — ABNORMAL HIGH (ref 70–99)
Potassium: 3 mmol/L — ABNORMAL LOW (ref 3.5–5.1)
Sodium: 138 mmol/L (ref 135–145)
Total Bilirubin: 0.3 mg/dL (ref 0.3–1.2)
Total Protein: 5.7 g/dL — ABNORMAL LOW (ref 6.5–8.1)

## 2020-06-29 LAB — APTT: aPTT: 39 seconds — ABNORMAL HIGH (ref 24–36)

## 2020-06-29 LAB — PROTIME-INR
INR: 1.2 (ref 0.8–1.2)
Prothrombin Time: 15.2 seconds (ref 11.4–15.2)

## 2020-06-29 LAB — LIPASE, BLOOD: Lipase: 23 U/L (ref 11–51)

## 2020-06-29 LAB — RESP PANEL BY RT-PCR (FLU A&B, COVID) ARPGX2: SARS Coronavirus 2 by RT PCR: NEGATIVE

## 2020-06-29 LAB — LACTIC ACID, PLASMA
Lactic Acid, Venous: 1.4 mmol/L (ref 0.5–1.9)
Lactic Acid, Venous: 1.9 mmol/L (ref 0.5–1.9)

## 2020-06-29 LAB — POC OCCULT BLOOD, ED: Fecal Occult Bld: POSITIVE — AB

## 2020-06-29 LAB — AMMONIA: Ammonia: 18 umol/L (ref 9–35)

## 2020-06-29 LAB — MAGNESIUM: Magnesium: 1.9 mg/dL (ref 1.7–2.4)

## 2020-06-29 LAB — ETHANOL: Alcohol, Ethyl (B): <10 mg/dL (ref <10)

## 2020-06-29 MED ORDER — PANTOPRAZOLE SODIUM 40 MG IV SOLR
40.0000 mg | Freq: Once | INTRAVENOUS | Status: AC
  Administered 2020-06-29: 40 mg via INTRAVENOUS
  Filled 2020-06-29: qty 40

## 2020-06-29 MED ORDER — SODIUM CHLORIDE 0.9 % IV SOLN
2.0000 g | Freq: Once | INTRAVENOUS | Status: AC
  Administered 2020-06-29: 2 g via INTRAVENOUS
  Filled 2020-06-29: qty 2

## 2020-06-29 MED ORDER — VANCOMYCIN HCL 750 MG/150ML IV SOLN: 750.0000 mg | INTRAVENOUS | Status: DC

## 2020-06-29 MED ORDER — LACTATED RINGERS IV BOLUS (SEPSIS)
500.0000 mL | Freq: Once | INTRAVENOUS | Status: AC
  Administered 2020-06-29: 500 mL via INTRAVENOUS

## 2020-06-29 MED ORDER — LACTATED RINGERS IV BOLUS
250.0000 mL | Freq: Once | INTRAVENOUS | Status: AC
  Administered 2020-06-29: 250 mL via INTRAVENOUS

## 2020-06-29 MED ORDER — LACTATED RINGERS IV SOLN: INTRAVENOUS | Status: AC

## 2020-06-29 MED ORDER — VANCOMYCIN HCL IN DEXTROSE 1-5 GM/200ML-% IV SOLN
1000.0000 mg | Freq: Once | INTRAVENOUS | Status: AC
Start: 2020-06-29 — End: 2020-06-29
  Administered 2020-06-29: 1000 mg via INTRAVENOUS
  Filled 2020-06-29: qty 200

## 2020-06-29 MED ORDER — SODIUM CHLORIDE 0.9 % IV SOLN
2.0000 g | INTRAVENOUS | Status: DC
  Administered 2020-06-30: 2 g via INTRAVENOUS
  Filled 2020-06-29: qty 2

## 2020-06-29 MED ORDER — POTASSIUM CHLORIDE 10 MEQ/100ML IV SOLN
10.0000 meq | INTRAVENOUS | Status: AC
  Administered 2020-06-29 (×2): 10 meq via INTRAVENOUS
  Filled 2020-06-29 (×2): qty 100

## 2020-06-29 MED ORDER — POTASSIUM CHLORIDE 10 MEQ/100ML IV SOLN
10.0000 meq | INTRAVENOUS | Status: AC
  Administered 2020-06-29 – 2020-06-30 (×3): 10 meq via INTRAVENOUS
  Filled 2020-06-29 (×3): qty 100

## 2020-06-29 MED ORDER — METRONIDAZOLE IN NACL 5-0.79 MG/ML-% IV SOLN
500.0000 mg | Freq: Once | INTRAVENOUS | Status: AC
  Administered 2020-06-29: 500 mg via INTRAVENOUS
  Filled 2020-06-29: qty 100

## 2020-06-29 NOTE — H&P (Signed)
TRH H&P    Patient Demographics:    Brenda Hopkins, is a 80 y.o. female  MRN: [id number]  DOB - [birth year]  Admit Date - 06/29/2020  Referring MD/NP/PA: Debarah Crape  Outpatient Primary MD for the patient is Shawnie Dapper, PA-C  Patient coming from: Home  Chief complaint- altered mental status, continued diarrhea, falls   HPI:    Brenda Hopkins  is a 80 y.o. female, with history of hypertension, hyperlipidemia, hiatal hernia, GERD, chronic back pain presents the ED today with a chief complaint of diarrhea, possible UTI, altered mental status, and falls.  Patient was recently discharged from the hospital on April 13.  At that time she was offered SNF placement but refused.  At that hospitalization she presented for altered mental status as well.  It was described as somnolence at that time.  When she became more awake she was pleasantly confused and intermittently agitated.  She had a CT of her brain at that time that showed small focus of cortical hypodensity in the right occipital lobe.  MRI brain did not show any acute findings.  She had acute on chronic CHF at that hospitalization as well with respiratory failure and hypoxia.  She had hematochezia and colitis as well.  It looks like she had a positive C. difficile on April 8 and was started on oral Vanco.  GI stool pathogen panel was negative.  In the hospital she did receive cefepime, Augmentin, metronidazole.  She had a lobar pneumonia and an AKI at that time as well.  Her baseline creatinine was noted to be 0.6-0.9 and was as high as 1.74 during that admission.  She was seen by PT at that hospitalization who recommended physical therapy at skilled nursing facility, but again patient declined.  She returns today confused and with continued diarrhea.  Family reports that she has had 3 falls since being at home, not eating, and family is concerned that they cannot take  care of her.  They noted her to have watery brown stool which is consistent with the ED providers exam.  FOBT was positive, although still did not look melanotic or have overt blood.  She did have p.o. skin over her sacrum presumably from diarrhea.  ED notes that patient was altered at arrival.  She was not responding and just staring at the ceiling.  When her daughter came to bedside she was responsive, and then when her daughter left she became more confused, trying to get out of bed, became irritable and agitated.  At the time of my exam patient cannot tell me why she is here.  When asked if she remembers having diarrhea she reports yes a lot of it, and she reports that she did not see any blood in her stool.  She reports that she has abdominal pain below her navel gets crampy and getting better.  She reports no nausea or vomiting.  She reports dysuria that started sometime during the week per her report.  No fevers.  Patient becomes increasingly agitated with each question I ask,  so history is limited.  ED provider reports that patient's daughter is power of attorney and confirmed DO NOT RESUSCITATE CODE STATUS.  They are also looking for placement from this hospitalization as they agree that they cannot take care of her at home.  In the ED Temperature 94.6 at arrival, improved after warming blanket, heart rate 48-75, respiratory rate 10-25 blood pressure 77/45 at arrival improved to 114/62 after fluid although chart reads 2 L nasal cannula patient was maintaining saturations on room air at time of my exam Positive FOBT Lactic acid 1.4 and then 1.9 CT head shows no acute intracranial abnormality Blood culture pending Respiratory pending Chest x-ray shows low lung volumes with atelectasis or scarring in the left lower lung UA is pending collection White blood cell count is 14.9, hemoglobin 9.4 Chemistry panel reveals an AKI with a creatinine of 2.21, BUN 54 AST elevated at 89, ALT 39 Alcohol  level less than 10 EKG shows a heart rate of 59, sinus rhythm, QTC 423 Patient was started on Vanco cefepime and Flagyl 1 L fluid bolus     Review of systems:    In addition to the HPI above,  No Fever-chills, No Headache, No changes with Vision or hearing, No problems swallowing food or Liquids, No Chest pain, Cough or Shortness of Breath, No Blood in stool or Urine, No new skin rashes or bruises, No new joints pains-aches,  No new weakness, tingling, numbness in any extremity, No polyuria, polydypsia or polyphagia,  All other systems reviewed and are negative.  Review of systems accuracy is unclear as patient does have difficulty with memory and details.    Past History of the following :    Past Medical History:  Diagnosis Date  . Chronic back pain   . GERD (gastroesophageal reflux disease)   . Hiatal hernia   . Hyperlipidemia   . Hypertension   . Pelvic fracture (HCC) 12/12/2012  . Smoker       Past Surgical History:  Procedure Laterality Date  . ABDOMINAL HYSTERECTOMY    . APPENDECTOMY    . BALLOON DILATION N/A 06/17/2020   Procedure: BALLOON DILATION;  Surgeon: Malissa Hippoehman, Najeeb U, MD;  [facility];  Service: Endoscopy;  Laterality: N/A;  . ESOPHAGOGASTRODUODENOSCOPY (EGD) WITH PROPOFOL N/A 01/17/2020   Procedure: ESOPHAGOGASTRODUODENOSCOPY (EGD) WITH PROPOFOL;  Surgeon: Dolores Frameastaneda Mayorga, Daniel, MD;  Location[facility];  Service: Gastroenterology;  Laterality: N/A;  . ESOPHAGOGASTRODUODENOSCOPY (EGD) WITH PROPOFOL N/A 06/17/2020   Procedure: ESOPHAGOGASTRODUODENOSCOPY (EGD) WITH PROPOFOL;  Surgeon: Malissa Hippoehman, Najeeb U, MD;  [facility];  Service: Endoscopy;  Laterality: N/A;  . 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 HEMORROIDECTOMY    . LUMBAR SPINE SURGERY    . OOPHORECTOMY    . TONSILLECTOMY        Social History:      Social History   Tobacco Use  . Smoking status: Former Smoker    Packs/day: 0.50    Types: Cigarettes  . Smokeless  tobacco: Former NeurosurgeonUser    Quit date: 12/28/2012  Substance Use Topics  . Alcohol use: No       Family History :    No family history on file. Family history could not be reviewed due to patient's dementia   Home Medications:   Prior to Admission medications   Medication Sig Start Date End Date Taking? Authorizing Provider  amLODipine (NORVASC) 10 MG tablet Take 0.5 tablets (5 mg total) by mouth daily. 06/24/20  Yes [place],  Mikle Bosworth, MD  atorvastatin (LIPITOR) 20 MG tablet TAKE 1 TABLET BY MOUTH EVERY NIGHT AT BEDTIME 07/11/16  Yes Allayne Butcher B, PA-C  furosemide (LASIX) 20 MG tablet Take 1 tablet (20 mg total) by mouth daily. 06/25/20  Yes Vassie Loll, MD  gabapentin (NEURONTIN) 600 MG tablet Take 1 tablet (600 mg total) by mouth 2 (two) times daily. 06/24/20  Yes Vassie Loll, MD  Iron Combinations (NIFEREX) TABS Take 1 tablet by mouth daily. 06/24/20  Yes Vassie Loll, MD  metoprolol tartrate (LOPRESSOR) 25 MG tablet Take 1 tablet (25 mg total) by mouth 2 (two) times daily. 06/24/20  Yes Vassie Loll, MD  oxyCODONE (ROXICODONE) 15 MG immediate release tablet Take 15 mg by mouth 4 (four) times daily.   Yes [provider]  pantoprazole (PROTONIX) 40 MG tablet Take 1 tablet (40 mg total) by mouth 2 (two) times daily. 01/31/20 04/30/20 Yes Emokpae, Courage, MD  potassium chloride SA (KLOR-CON) 20 MEQ tablet Take 20 mEq by mouth 2 (two) times daily. 01/02/20  Yes [provider]  QUEtiapine (SEROQUEL) 200 MG tablet Take 200 mg by mouth at bedtime. 10/22/19  Yes [provider]  rOPINIRole (REQUIP) 3 MG tablet Take 3 mg by mouth at bedtime. 10/23/19  Yes [provider]  sertraline (ZOLOFT) 25 MG tablet Take 25 mg by mouth daily. 05/04/20  Yes [provider]  sucralfate (CARAFATE) 1 g tablet Take 1 tablet (1 g total) by mouth 2 (two) times daily. 01/31/20 03/01/20 Yes Emokpae, Courage, MD  vancomycin (VANCOCIN) 50 mg/mL oral solution Take 2.5 mLs  (125 mg total) by mouth 4 (four) times daily for 5 days. 06/24/20 06/29/20 Yes Vassie Loll, MD     Allergies:     Allergies  Allergen Reactions  . Prozac [Fluoxetine Hcl] Other (See Comments)    Makes her fell crazy  . Zoloft [Sertraline Hcl]     Made her feel crazy--10/2013  . Nicoderm [Nicotine] Rash    Site rash     Physical Exam:   Vitals  Blood pressure (!) 128/94, pulse 66, temperature 97.9 F (36.6 C), temperature source Oral, resp. rate 14, SpO2 97 %.  1.  General: Patient lying supine in bed, no acute distress  2. Psychiatric: Irritable, agitated intermittently, oriented to self and place but not time cooperative with physical exam  3. Neurologic: Patient is symmetric, speech and language normal, moves all 4 extremities voluntarily, no acute deficit on limited exam  4. HEENMT:  Head is atraumatic, normocephalic, pupils are reactive to light, neck is supple, trachea is midline, mucous membranes are moist  5. Respiratory : Lungs are clear to auscultation bilaterally without wheezing, rhonchi, rales, no clubbing, cyanosis  6. Cardiovascular : Heart rate is normal, rhythm is regular, no murmurs rubs or gallops, no peripheral edema, peripheral pulses palpated  7. Gastrointestinal:  Abdomen is soft, nondistended, nontender to palpation, bowel sounds active  8. Skin:  Raw in the gluteal fold, no other acute lesions or rashes, skin is otherwise warm dry and intact  9.Musculoskeletal:  No acute deformities, no calf tenderness, no peripheral edema, peripheral pulses palpated    Data Review:    CBC Recent Labs  Lab 06/23/20 0436 06/24/20 0425 06/29/20 1722  WBC 7.4 8.8 14.9*  HGB 10.1* 10.3* 9.4*  HCT 34.1* 36.2 31.7*  PLT 334 399 638*  MCV 90.0 92.6 92.2  MCH 26.6 26.3 27.3  MCHC 29.6* 28.5* 29.7*  RDW 18.3* 19.1* 20.3*  LYMPHSABS  --   --  1.2  MONOABS  --   --  0.7  EOSABS  --   --  0.3  BASOSABS  --   --  0.0    ------------------------------------------------------------------------------------------------------------------  Results for orders placed or performed during the hospital encounter of 06/29/20 (from the past 48 hour(s))  CBC with Differential/Platelet     Status: Abnormal   Collection Time: 06/29/20  5:22 PM  Result Value Ref Range   WBC 14.9 (H) 4.0 - 10.5 K/uL   RBC 3.44 (L) 3.87 - 5.11 MIL/uL   Hemoglobin 9.4 (L) 12.0 - 15.0 g/dL   HCT 16.1 (L) 09.6 - 04.5 %   MCV 92.2 80.0 - 100.0 fL   MCH 27.3 26.0 - 34.0 pg   MCHC 29.7 (L) 30.0 - 36.0 g/dL   RDW 40.9 (H) 81.1 - 91.4 %   Platelets 638 (H) 150 - 400 K/uL   nRBC 0.0 0.0 - 0.2 %   Neutrophils Relative % 84 %   Neutro Abs 12.5 (H) 1.7 - 7.7 K/uL   Lymphocytes Relative 8 %   Lymphs Abs 1.2 0.7 - 4.0 K/uL   Monocytes Relative 5 %   Monocytes Absolute 0.7 0.1 - 1.0 K/uL   Eosinophils Relative 2 %   Eosinophils Absolute 0.3 0.0 - 0.5 K/uL   Basophils Relative 0 %   Basophils Absolute 0.0 0.0 - 0.1 K/uL   Immature Granulocytes 1 %   Abs Immature Granulocytes 0.18 (H) 0.00 - 0.07 K/uL    Comment: Performed at [facility], [address], [place], [place] [address]  Comprehensive metabolic panel     Status: Abnormal   Collection Time: 06/29/20  5:22 PM  Result Value Ref Range   Sodium 138 135 - 145 mmol/L   Potassium 3.0 (L) 3.5 - 5.1 mmol/L   Chloride 109 98 - 111 mmol/L   CO2 21 (L) 22 - 32 mmol/L   Glucose, Bld 128 (H) 70 - 99 mg/dL    Comment: Glucose reference range applies only to samples taken after fasting for at least 8 hours.   BUN 54 (H) 8 - 23 mg/dL   Creatinine, Ser 6.21 (H) 0.44 - 1.00 mg/dL   Calcium 8.2 (L) 8.9 - 10.3 mg/dL   Total Protein 5.7 (L) 6.5 - 8.1 g/dL   Albumin 2.4 (L) 3.5 - 5.0 g/dL   AST 89 (H) 15 - 41 U/L   ALT 39 0 - 44 U/L   Alkaline Phosphatase 80 38 - 126 U/L   Total Bilirubin 0.3 0.3 - 1.2 mg/dL   GFR, Estimated 22 (L) >60 mL/min    Comment: (NOTE) Calculated using the CKD-EPI  Creatinine Equation (2021)    Anion gap 8 5 - 15    Comment: Performed at [facility], [address], [place], [place] [address]  Lipase, blood     Status: None   Collection Time: 06/29/20  5:22 PM  Result Value Ref Range   Lipase 23 11 - 51 U/L    Comment: Performed at [facility], [address] [place], [place] [address]  Ammonia     Status: None   Collection Time: 06/29/20  5:22 PM  Result Value Ref Range   Ammonia 18 9 - 35 umol/L    Comment: Performed at [facility], [address], [place], [place] [address]  Ethanol     Status: None   Collection Time: 06/29/20  5:22 PM  Result Value Ref Range   Alcohol, Ethyl (B) <10 <10 mg/dL  Comment: (NOTE) Lowest detectable limit for serum alcohol is 10 mg/dL.  For medical purposes only. Performed at [facility], [address] [place], [place] [address]   Magnesium     Status: None   Collection Time: 06/29/20  5:22 PM  Result Value Ref Range   Magnesium 1.9 1.7 - 2.4 mg/dL    Comment: Performed at [facility], [address] [place], [place] [address]  Protime-INR     Status: None   Collection Time: 06/29/20  5:22 PM  Result Value Ref Range   Prothrombin Time 15.2 11.4 - 15.2 seconds   INR 1.2 0.8 - 1.2    Comment: (NOTE) INR goal varies based on device and disease states. Performed at [facility], [address], [place], [place] [address]   APTT     Status: Abnormal   Collection Time: 06/29/20  5:22 PM  Result Value Ref Range   aPTT 39 (H) 24 - 36 seconds    Comment:        IF BASELINE aPTT IS ELEVATED, SUGGEST PATIENT RISK ASSESSMENT BE USED TO DETERMINE APPROPRIATE ANTICOAGULANT THERAPY. Performed at [facility], [address], [place], [place] [address]   Blood culture (routine x 2)     Status: None (Preliminary result)   Collection Time: 06/29/20  6:17 PM   Specimen: BLOOD  Result Value Ref Range   Specimen Description BLOOD RIGHT ANTECUBITAL    Special Requests      BOTTLES DRAWN AEROBIC AND ANAEROBIC  Blood Culture adequate volume Performed at [facility], [address], [place], [place] [address]    Culture PENDING    Report Status PENDING   Blood culture (routine x 2)     Status: None (Preliminary result)   Collection Time: 06/29/20  6:39 PM   Specimen: BLOOD LEFT WRIST  Result Value Ref Range   Specimen Description BLOOD LEFT WRIST    Special Requests      BOTTLES DRAWN AEROBIC AND ANAEROBIC Blood Culture results may not be optimal due to an inadequate volume of blood received in culture bottles Performed at [facility], [address], [place], [place] [address]    Culture PENDING    Report Status PENDING   Lactic acid, plasma     Status: None   Collection Time: 06/29/20  6:45 PM  Result Value Ref Range   Lactic Acid, Venous 1.4 0.5 - 1.9 mmol/L    Comment: Performed at [facility], [address] [place], [place] [address]  Lactic acid, plasma     Status: None   Collection Time: 06/29/20  8:10 PM  Result Value Ref Range   Lactic Acid, Venous 1.9 0.5 - 1.9 mmol/L    Comment: Performed at [facility], [address] [place], [place] [address]  POC occult blood, ED     Status: Abnormal   Collection Time: 06/29/20  9:06 PM  Result Value Ref Range   Fecal Occult Bld POSITIVE (A) NEGATIVE    Chemistries  Recent Labs  Lab 06/23/20 0436 06/24/20 0425 06/29/20 1722  NA 140 141 138  K 3.4* 3.3* 3.0*  CL 104 108 109  CO2 26 23 21*  GLUCOSE 95 145* 128*  BUN 8 19 54*  CREATININE 0.67 1.00 2.21*  CALCIUM 8.3* 8.4* 8.2*  MG 1.7 2.2 1.9  AST 15 12* 89*  ALT 14 14 39  ALKPHOS 86 86 80  BILITOT 0.8 0.5 0.3   ------------------------------------------------------------------------------------------------------------------  ------------------------------------------------------------------------------------------------------------------ GFR: Estimated Creatinine Clearance: 18.4 mL/min (  A) (by C-G formula based on SCr of 2.21 mg/dL (H)). Liver Function Tests: Recent  Labs  Lab 06/23/20 0436 06/24/20 0425 06/29/20 1722  AST 15 12* 89*  ALT 14 14 39  ALKPHOS 86 86 80  BILITOT 0.8 0.5 0.3  PROT 5.7* 5.9* 5.7*  ALBUMIN 2.6* 2.7* 2.4*   Recent Labs  Lab 06/29/20 1722  LIPASE 23   Recent Labs  Lab 06/29/20 1722  AMMONIA 18   Coagulation Profile: Recent Labs  Lab 06/29/20 1722  INR 1.2   Cardiac Enzymes: No results for input(s): CKTOTAL, CKMB, CKMBINDEX, TROPONINI in the last 168 hours. BNP (last 3 results) No results for input(s): PROBNP in the last 8760 hours. HbA1C: No results for input(s): HGBA1C in the last 72 hours. CBG: No results for input(s): GLUCAP in the last 168 hours. Lipid Profile: No results for input(s): CHOL, HDL, LDLCALC, TRIG, CHOLHDL, LDLDIRECT in the last 72 hours. Thyroid Function Tests: No results for input(s): TSH, T4TOTAL, FREET4, T3FREE, THYROIDAB in the last 72 hours. Anemia Panel: No results for input(s): VITAMINB12, FOLATE, FERRITIN, TIBC, IRON, RETICCTPCT in the last 72 hours.  --------------------------------------------------------------------------------------------------------------- Urine analysis:    Component Value Date/Time   COLORURINE AMBER (A) 06/18/2020 1219   APPEARANCEUR CLEAR 06/18/2020 1219   LABSPEC 1.010 06/18/2020 1219   PHURINE 5.0 06/18/2020 1219   GLUCOSEU NEGATIVE 06/18/2020 1219   HGBUR SMALL (A) 06/18/2020 1219   BILIRUBINUR NEGATIVE 06/18/2020 1219   KETONESUR NEGATIVE 06/18/2020 1219   PROTEINUR NEGATIVE 06/18/2020 1219   NITRITE NEGATIVE 06/18/2020 1219   LEUKOCYTESUR NEGATIVE 06/18/2020 1219      Imaging Results:    CT Head Wo Contrast  Result Date: 06/29/2020 CLINICAL DATA:  Fall altered EXAM: CT HEAD WITHOUT CONTRAST TECHNIQUE: Contiguous axial images were obtained from the base of the skull through the vertex without intravenous contrast. COMPARISON:  MRI 06/19/2020, CT brain 06/18/2020 FINDINGS: Brain: No acute territorial infarction, hemorrhage or  intracranial mass. Mild atrophy. Moderate white matter hypodensity consistent with chronic small vessel ischemic change. Chronic lacunar infarcts within the bilateral basal ganglia. Stable ventricle size Vascular: No hyperdense vessels.  Carotid vascular calcification Skull: Normal. Negative for fracture or focal lesion. Sinuses/Orbits: No acute finding. Other: None IMPRESSION: 1. No CT evidence for acute intracranial abnormality. 2. Atrophy and chronic small vessel ischemic changes of the white matter. Electronically Signed   By: Jasmine Pang M.D.   On: 06/29/2020 20:38   DG Chest Port 1 View  Result Date: 06/29/2020 CLINICAL DATA:  Code sepsis altered week EXAM: PORTABLE CHEST 1 VIEW COMPARISON:  06/19/2020 FINDINGS: Low lung volumes. Atelectasis or scarring in the left lower lung. No focal consolidation or effusion. Stable enlarged cardiomediastinal silhouette with aortic atherosclerosis. No pneumothorax. Chronic bilateral shoulder deformities. IMPRESSION: Low lung volumes with atelectasis or scarring in the left lower lung. Cardiomegaly Electronically Signed   By: Jasmine Pang M.D.   On: 06/29/2020 18:17    My personal review of EKG: Rhythm NSR, Rate 59 /min, QTc 423 ,no Acute ST changes   Assessment & Plan:    Active Problems:   AKI (acute kidney injury) (HCC)   Hypoalbuminemia   Acute metabolic encephalopathy   SIRS (systemic inflammatory response syndrome) (HCC)   Hypokalemia   Falls   1. SIRS 1. Meet SIRS criteria with white blood cell count of 14.9, temperature 94.6, respiratory rate 25, blood pressure 77/45 2. Recurrence of colitis?? 3. UA pending 4. Chest x-ray does not show acute finding 5. Blood cultures  pending 6. Respiratory panel pending 7. Lactic acid 1.4-1.9 8. Started with broad-spectrum antibiotics, Vanco cefepime Flagyl 9. Noted recently to have colitis and was discharged with oral Vanco 2. AKI 1. Secondary to GI losses 2. 1 L fluid in ED 3. Continue IV  hydration 4. Avoid nephrotoxic agents when possible 5. Trend in the a.m. 3. Hypothermia 1. Temperature 94.6 rectally at arrival 2. Bradycardia 3. Temperature improved to 98.5 with warming blanket, bradycardia improved as well 4. Hypoalbuminemia 1. Albumin currently 2.4 2. Add protein shakes when patient is able to tolerate p.o. 5. Acute metabolic encephalopathy 1. Likely secondary to infection 2. Replace electrolytes 3. Continue broad-spectrum antibiotics 4. Continue monitor 6. Hypokalemia 1. Replace and recheck 7. Falls 1. Consult PT 8. Hypertension 1. Currently holding antihypertensives given hypotension at arrival 9. Likely to dispo to SNF in 36 to 48 hours    DVT Prophylaxis-Heparin SCDs   AM Labs Ordered, also please review Full Orders  Family Communication: No family at bedside at the time of my exam  Code Status: DNR Admission status:Inpatient :The appropriate admission status for this patient is INPATIENT. Inpatient status is judged to be reasonable and necessary in order to provide the required intensity of service to ensure the patient's safety. The patient's presenting symptoms, physical exam findings, and initial radiographic and laboratory data in the context of their chronic comorbidities is felt to place them at high risk for further clinical deterioration. Furthermore, it is not anticipated that the patient will be medically stable for discharge from the hospital within 2 midnights of admission. The following factors support the admission status of inpatient.     The patient's presenting symptoms include altered mental status, diarrhea, falls The worrisome physical exam findings include hypothermia, hypotensive, bradycardic The initial radiographic and laboratory data are worrisome because of leukocytosis at 14.9. The chronic co-morbidities include hypertension, hyperlipidemia, GERD       * I certify that at the point of admission it is my clinical judgment  that the patient will require inpatient hospital care spanning beyond 2 midnights from the point of admission due to high intensity of service, high risk for further deterioration and high frequency of surveillance required.*  Time spent in minutes : 65  Chrysten Woulfe B Zierle-Ghosh DO

## 2020-06-29 NOTE — ED Notes (Signed)
Pt in CT, will reassess vitals when pt returns to room.

## 2020-06-29 NOTE — Progress Notes (Signed)
Pharmacy Antibiotic Note  Brenda Hopkins is a 80 y.o. female admitted on 06/29/2020 with sepsis.  Pharmacy has been consulted for cefepime and vancomycin2 dosing.  Plan: Vancomycin 750mg  IV every 48 hours.  Goal trough 15-20 mcg/mL. cefepime2gm iv q24h      Temp (24hrs), Avg:96.6 F (35.9 C), Min:94.6 F (34.8 C), Max:98.5 F (36.9 C)  Recent Labs  Lab 06/23/20 0436 06/24/20 0425 06/29/20 1722 06/29/20 1845  WBC 7.4 8.8 14.9*  --   CREATININE 0.67 1.00 2.21*  --   LATICACIDVEN  --   --   --  1.4    Estimated Creatinine Clearance: 18.4 mL/min (A) (by C-G formula based on SCr of 2.21 mg/dL (H)).    Allergies  Allergen Reactions  . Prozac [Fluoxetine Hcl] Other (See Comments)    Makes her fell crazy  . Zoloft [Sertraline Hcl]     Made her feel crazy--10/2013  . Nicoderm [Nicotine] Rash    Site rash    Antimicrobials this admission: 4/18 cefepime >> 4/18 vancomycin >>   Microbiology results: 4/18 BCx: sent  Thank you for allowing pharmacy to be a part of this patient's care.  5/18 Mclain Freer 06/29/2020 7:59 PM

## 2020-06-29 NOTE — Patient Outreach (Signed)
[facility]) Care Management  06/29/2020  Lexiana Spindel February 16, 1941 [id number]   RED ON EMMI ALERT - General Discharge Day # 1 Date: 4/16 Red Alert Reason: Not scheduled follow up   Outreach attempt #1, unsuccessful, HIPAA compliant voice message left.   Plan: RN CM will send outreach letter and follow up within the next 3-4 business days.  Kemper Durie, [place], MSN [facility] Care Management  [facility] Manager [phone]

## 2020-06-29 NOTE — ED Notes (Signed)
Patient transported to CT 

## 2020-06-29 NOTE — ED Triage Notes (Signed)
Recently discharged from the hospital, altered mental status, weakness, unable to walk. Family concerned that she may have UTI, also has diarrhea

## 2020-06-29 NOTE — Sepsis Progress Note (Signed)
Code sepsis protocol being monitored by eLink. 

## 2020-06-29 NOTE — ED Notes (Addendum)
Pt family left bedside, pt now confused and trying to get out of bed, pulling at lines.. Pt redirected and charge nurse notified.

## 2020-06-29 NOTE — ED Notes (Signed)
Pt in bed, pt is stable at this time, pt arouses to verbal stim, pt moving all extremities, provider at bedside and has evaluated pt.

## 2020-06-29 NOTE — ED Provider Notes (Signed)
[facility] EMERGENCY DEPARTMENT Provider Note   CSN: [id number] Arrival date & time: 06/29/20  1640     History Chief Complaint  Patient presents with  . Altered Mental Status    Brenda Hopkins is a 80 y.o. female with history of tobacco use, hypertension, marginal ulcer, hypertension chronic back pain brought to the ED from home for evaluation of continued diarrhea, multiple falls, confusion.  Patient is able to tell me her name and that she is at [facility].  She does not know why she is here.  Level 5 caveat due to confusion, acuity of condition.  Additional information obtained from patient's daughter who is at bedside.  She is patient's power of attorney.  Reports patient was discharged from the hospital on Thursday.  Originally they had offered placement into a facility but patient declined and wanted to be discharged home.  Patient is now living with her daughter in her guest room.  Patient has had 3 unwitnessed falls.  Daughter states she is too weak to walk and unsteady.  She has continued to have uncontrollable large-volume brown watery diarrhea since discharge from the hospital.  They are unable to help her stay clean.  Over last 24 hours patient has become more confused.  She thinks it is 84.  Unclear if patient hit her head during the falls.  Patient has not been eating or drinking much since hospital discharge.  No report of fever, vomiting, cough.  HPI     Past Medical History:  Diagnosis Date  . Chronic back pain   . GERD (gastroesophageal reflux disease)   . Hiatal hernia   . Hyperlipidemia   . Hypertension   . Pelvic fracture (HCC) 12/12/2012  . Smoker     Patient Active Problem List   Diagnosis Date Noted  . Physical deconditioning   . SIRS (systemic inflammatory response syndrome) (HCC) 06/19/2020  . Septic shock (HCC)   . Proctitis   . Acute metabolic encephalopathy 06/18/2020  . Hematochezia 06/18/2020  . Abdominal pain   . Anemia   . Acute on  chronic diastolic CHF (congestive heart failure) (HCC) 06/12/2020  . Gi Tract---Marginal ulcer 01/30/2020  . Noncompliance with medication regimen 01/30/2020  . AKI (acute kidney injury) (HCC) 01/30/2020  . Dehydration 01/30/2020  . Hypoalbuminemia 01/30/2020  . Obesity (BMI 30.0-34.9) 01/30/2020  . Nausea 01/30/2020  . Acute respiratory failure with hypoxia (HCC) 01/30/2020  . Epigastric pain 01/16/2020  . Ventral hernia without obstruction or gangrene   . Intractable epigastric abdominal pain 01/15/2020  . Insomnia 05/13/2014  . Generalized anxiety disorder 07/15/2013  . Hypertension   . Chronic back pain   . Hyperlipidemia   . GERD (gastroesophageal reflux disease)   . Hiatal hernia   . Smoker   . Pelvic fracture (HCC) 12/12/2012    Past Surgical History:  Procedure Laterality Date  . ABDOMINAL HYSTERECTOMY    . APPENDECTOMY    . BALLOON DILATION N/A 06/17/2020   Procedure: BALLOON DILATION;  Surgeon: Malissa Hippo, MD;  Location: [facility];  Service: Endoscopy;  Laterality: N/A;  . ESOPHAGOGASTRODUODENOSCOPY (EGD) WITH PROPOFOL N/A 01/17/2020   Procedure: ESOPHAGOGASTRODUODENOSCOPY (EGD) WITH PROPOFOL;  Surgeon: Dolores Frame, MD;  Location: [facility];  Service: Gastroenterology;  Laterality: N/A;  . ESOPHAGOGASTRODUODENOSCOPY (EGD) WITH PROPOFOL N/A 06/17/2020   Procedure: ESOPHAGOGASTRODUODENOSCOPY (EGD) WITH PROPOFOL;  Surgeon: Malissa Hippo, MD;  Location: [facility];  Service: Endoscopy;  Laterality: N/A;  . 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 HEMORROIDECTOMY    . LUMBAR SPINE SURGERY    . OOPHORECTOMY    . TONSILLECTOMY       OB History   No obstetric history on file.     No family history on file.  Social History   Tobacco Use  . Smoking status: Former Smoker    Packs/day: 0.50    Types: Cigarettes  . Smokeless tobacco: Former NeurosurgeonUser    Quit date: 12/28/2012  Vaping Use  . Vaping Use: Every day  Substance Use Topics  . Alcohol use: No   . Drug use: No    Home Medications Prior to Admission medications   Medication Sig Start Date End Date Taking? Authorizing Provider  amLODipine (NORVASC) 10 MG tablet Take 0.5 tablets (5 mg total) by mouth daily. 06/24/20  Yes Vassie LollMadera, Carlos, MD  atorvastatin (LIPITOR) 20 MG tablet TAKE 1 TABLET BY MOUTH EVERY NIGHT AT BEDTIME 07/11/16  Yes Allayne Butcherixon, Mary B, PA-C  furosemide (LASIX) 20 MG tablet Take 1 tablet (20 mg total) by mouth daily. 06/25/20  Yes Vassie LollMadera, Carlos, MD  gabapentin (NEURONTIN) 600 MG tablet Take 1 tablet (600 mg total) by mouth 2 (two) times daily. 06/24/20  Yes Vassie LollMadera, Carlos, MD  Iron Combinations (NIFEREX) TABS Take 1 tablet by mouth daily. 06/24/20  Yes Vassie LollMadera, Carlos, MD  metoprolol tartrate (LOPRESSOR) 25 MG tablet Take 1 tablet (25 mg total) by mouth 2 (two) times daily. 06/24/20  Yes Vassie LollMadera, Carlos, MD  oxyCODONE (ROXICODONE) 15 MG immediate release tablet Take 15 mg by mouth 4 (four) times daily.   Yes [provider]  pantoprazole (PROTONIX) 40 MG tablet Take 1 tablet (40 mg total) by mouth 2 (two) times daily. 01/31/20 04/30/20 Yes Emokpae, Courage, MD  potassium chloride SA (KLOR-CON) 20 MEQ tablet Take 20 mEq by mouth 2 (two) times daily. 01/02/20  Yes [provider]  QUEtiapine (SEROQUEL) 200 MG tablet Take 200 mg by mouth at bedtime. 10/22/19  Yes [provider]  rOPINIRole (REQUIP) 3 MG tablet Take 3 mg by mouth at bedtime. 10/23/19  Yes [provider]  sertraline (ZOLOFT) 25 MG tablet Take 25 mg by mouth daily. 05/04/20  Yes [provider]  sucralfate (CARAFATE) 1 g tablet Take 1 tablet (1 g total) by mouth 2 (two) times daily. 01/31/20 03/01/20 Yes Emokpae, Courage, MD  vancomycin (VANCOCIN) 50 mg/mL oral solution Take 2.5 mLs (125 mg total) by mouth 4 (four) times daily for 5 days. 06/24/20 06/29/20 Yes Vassie LollMadera, Carlos, MD    Allergies    Prozac [fluoxetine hcl], Zoloft [sertraline hcl], and Nicoderm [nicotine]  Review  of Systems   Review of Systems  Gastrointestinal: Positive for diarrhea.  Musculoskeletal: Positive for gait problem.  Psychiatric/Behavioral: Positive for confusion.  All other systems reviewed and are negative.   Physical Exam Updated Vital Signs BP 122/67   Pulse 66   Temp 97.8 F (36.6 C) (Oral)   Resp 16   SpO2 95%   Physical Exam Vitals and nursing note reviewed.  Constitutional:      Appearance: She is well-developed. She is ill-appearing.  HENT:     Head: Normocephalic and atraumatic.     Comments: No facial, scalp bone tenderness or signs of obvious trauma.    Nose: Nose normal.     Mouth/Throat:     Mouth: Mucous membranes are dry.     Comments: Lips and mucous membranes extremely dry Eyes:     Conjunctiva/sclera: Conjunctivae normal.  Cardiovascular:  Rate and Rhythm: Regular rhythm. Bradycardia present.     Comments: Bradycardic in the low 50s.  1+ radial and DP pulses.  Distal upper and lower extremities are cool to touch.  Pulmonary:     Effort: Pulmonary effort is normal.     Breath sounds: Normal breath sounds.  Abdominal:     General: Bowel sounds are normal.     Palpations: Abdomen is soft.     Tenderness: There is no abdominal tenderness.     Comments: No G/R/R. No suprapubic or CVA tenderness. Negative Murphy's and McBurney's. Active BS to lower quadrants.   Genitourinary:    Rectum: Guaiac result positive.     Comments: Perianal, perineal and vulva with erythema diffusely.  Several external hemorrhoids noted.  Normal rectal tone.  Stool is watery and light brown. Musculoskeletal:        General: Normal range of motion.     Cervical back: Normal range of motion.  Skin:    General: Skin is warm and dry.     Capillary Refill: Capillary refill takes less than 2 seconds.     Findings: Erythema present.  Neurological:     Mental Status: She is alert. She is disoriented.     Comments:   Awake.  Can tell me her full name, place. Disoriented to  year, events. Speech is dysarthric, slow to respont. Sensation and strength testing unreliable. Patient cannot follow commands.  PERRL bilaterally. Spontaneously moving all four extremities against gravity and resisting movement. Trying to sit up  Psychiatric:        Behavior: Behavior normal.     ED Results / Procedures / Treatments   Labs (all labs ordered are listed, but only abnormal results are displayed) Labs Reviewed  CBC WITH DIFFERENTIAL/PLATELET - Abnormal; Notable for the following components:      Result Value   WBC 14.9 (*)    RBC 3.44 (*)    Hemoglobin 9.4 (*)    HCT 31.7 (*)    MCHC 29.7 (*)    RDW 20.3 (*)    Platelets 638 (*)    Neutro Abs 12.5 (*)    Abs Immature Granulocytes 0.18 (*)    All other components within normal limits  COMPREHENSIVE METABOLIC PANEL - Abnormal; Notable for the following components:   Potassium 3.0 (*)    CO2 21 (*)    Glucose, Bld 128 (*)    BUN 54 (*)    Creatinine, Ser 2.21 (*)    Calcium 8.2 (*)    Total Protein 5.7 (*)    Albumin 2.4 (*)    AST 89 (*)    GFR, Estimated 22 (*)    All other components within normal limits  APTT - Abnormal; Notable for the following components:   aPTT 39 (*)    All other components within normal limits  POC OCCULT BLOOD, ED - Abnormal; Notable for the following components:   Fecal Occult Bld POSITIVE (*)    All other components within normal limits  CULTURE, BLOOD (ROUTINE X 2)  CULTURE, BLOOD (ROUTINE X 2)  GASTROINTESTINAL PANEL BY PCR, STOOL (REPLACES STOOL CULTURE)  RESP PANEL BY RT-PCR (FLU A&B, COVID) ARPGX2  URINE CULTURE  LIPASE, BLOOD  LACTIC ACID, PLASMA  LACTIC ACID, PLASMA  AMMONIA  ETHANOL  MAGNESIUM  PROTIME-INR  RAPID URINE DRUG SCREEN, HOSP PERFORMED  URINALYSIS, ROUTINE W REFLEX MICROSCOPIC  I-STAT VENOUS BLOOD GAS, ED    EKG EKG Interpretation  Date/Time:  Monday June 29 2020 17:15:27 EDT Ventricular Rate:  59 PR Interval:  178 QRS Duration: 106 QT  Interval:  427 QTC Calculation: 423 R Axis:   -15 Text Interpretation: Sinus rhythm Borderline left axis deviation Borderline T wave abnormalities rate slower than prior 4/22 Confirmed by Meridee Score [phone]) on 06/29/2020 5:34:38 PM   Radiology CT Head Wo Contrast  Result Date: 06/29/2020 CLINICAL DATA:  Fall altered EXAM: CT HEAD WITHOUT CONTRAST TECHNIQUE: Contiguous axial images were obtained from the base of the skull through the vertex without intravenous contrast. COMPARISON:  MRI 06/19/2020, CT brain 06/18/2020 FINDINGS: Brain: No acute territorial infarction, hemorrhage or intracranial mass. Mild atrophy. Moderate white matter hypodensity consistent with chronic small vessel ischemic change. Chronic lacunar infarcts within the bilateral basal ganglia. Stable ventricle size Vascular: No hyperdense vessels.  Carotid vascular calcification Skull: Normal. Negative for fracture or focal lesion. Sinuses/Orbits: No acute finding. Other: None IMPRESSION: 1. No CT evidence for acute intracranial abnormality. 2. Atrophy and chronic small vessel ischemic changes of the white matter. Electronically Signed   By: Jasmine Pang M.D.   On: 06/29/2020 20:38   DG Chest Port 1 View  Result Date: 06/29/2020 CLINICAL DATA:  Code sepsis altered week EXAM: PORTABLE CHEST 1 VIEW COMPARISON:  06/19/2020 FINDINGS: Low lung volumes. Atelectasis or scarring in the left lower lung. No focal consolidation or effusion. Stable enlarged cardiomediastinal silhouette with aortic atherosclerosis. No pneumothorax. Chronic bilateral shoulder deformities. IMPRESSION: Low lung volumes with atelectasis or scarring in the left lower lung. Cardiomegaly Electronically Signed   By: Jasmine Pang M.D.   On: 06/29/2020 18:17    Procedures Procedures   Medications Ordered in ED Medications  lactated ringers infusion ( Intravenous New Bag/Given 06/29/20 1921)  potassium chloride 10 mEq in 100 mL IVPB ( Intravenous Infusion Verify  06/29/20 2133)  vancomycin (VANCOREADY) IVPB 750 mg/150 mL (has no administration in time range)  ceFEPIme (MAXIPIME) 2 g in sodium chloride 0.9 % 100 mL IVPB (has no administration in time range)  lactated ringers bolus 250 mL (0 mLs Intravenous Stopped 06/29/20 1755)  lactated ringers bolus 250 mL (0 mLs Intravenous Stopped 06/29/20 1815)  lactated ringers bolus 500 mL (0 mLs Intravenous Stopped 06/29/20 1925)  ceFEPIme (MAXIPIME) 2 g in sodium chloride 0.9 % 100 mL IVPB (0 g Intravenous Stopped 06/29/20 1923)  metroNIDAZOLE (FLAGYL) IVPB 500 mg (0 mg Intravenous Stopped 06/29/20 2056)  vancomycin (VANCOCIN) IVPB 1000 mg/200 mL premix (0 mg Intravenous Stopped 06/29/20 2056)  pantoprazole (PROTONIX) injection 40 mg (40 mg Intravenous Given 06/29/20 2056)    ED Course  I have reviewed the triage vital signs and the nursing notes.  Pertinent labs & imaging results that were available during my care of the patient were reviewed by me and considered in my medical decision making (see chart for details).  Clinical Course as of 06/29/20 2201  Mon Jun 29, 2020  1707 She was recently admitted for sepsis CHF and CHF discharged home after declined rehab.  Increased confusion and worsening diarrhea.  Generalized weakness and inability to safely ambulate.  Getting labs cultures, IV fluids, will need admission. [MB]  1757 Pulse Rate(!): 51 [CG]  1757 BP(!): 77/45 [CG]  1757 MAP (mmHg)(!): 56 [CG]  1757 SpO2: 100 % [CG]  1757 WBC(!): 14.9 [CG]  1757 Hemoglobin(!): 9.4 From 10.3  [CG]  1757 Creatinine(!): 2.21 [CG]  1952 BUN(!): 54 [CG]  1952 Anion gap: 8 [CG]  2019 DG Chest Port 1 View IMPRESSION: Low lung volumes with  atelectasis or scarring in the left lower lung. Cardiomegaly [CG]    Clinical Course User Index [CG] Liberty Handy, PA-C [MB] Terrilee Files, MD   MDM Rules/Calculators/A&P                          80 year old female brought to the ED from home for continued diarrhea,  decreased p.o. intake, multiple falls and confusion.  Recent hospitalization.  On arrival patient is confused, hypotensive 77/45 MAP 56, bradycardic 51, hypothermia 94.6 F (rectal). Single hypoxic documented reading in triage, normal Spo2 since.   EMR, triage nurse notes reviewed to obtain more history and assist with MDM.  I obtained additional information from patient's daughter/POA at bedside.  Briefly, hospitalized from 4/1-4/13 for acute metabolic encephalopathy due to sepsis, hypoxia, AKI.  She had acute on chronic diastolic CHF, acute respiratory failure with hypoxia and essentially weaned from oxygen at discharge.  Also had diarrhea/colitis.  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  At some point there was concern for esophageal perforation but this was ruled out. Her GI panel was negative.  She had abnormal C. difficile results in her stools and she was discharged with vancomycin p.o.  Her septic shock was thought to be secondary to a pneumonia and intra-abdominal source, blood cultures were negative.  She finished antibiotics for pneumonia.  Ddx of confusion - metabolic abnormality vs uremia vs infection/sepsis vs intracranial injury from falls.  Appears clinically dehydrated. No obvious droop, weakness although neuro exam is very limited.   Labs, imaging ordered by me as above. Labs and imaging personally visualized and interpreted.   Labs reveal -WBC 14.9, normal lactic acid.  Hemoglobin 9.4, HCT 31.7 this is decreased from hospitalization.  K3.0, normal magnesium.  Creatinine 2.21, BUN 54.  Hemoccult positive although stool was brown on my exam.  Normal ammonia, undetectable EtOH  Pending labs-cath urinalysis, UDS, respiratory panel, VBG, GI panel, blood cultures  Imaging reveals-chest x-ray without acute findings.  CT head without acute findings.  EKG without significant changes from previous tracing.  Medicines given in the ED-broad-spectrum  antibiotics including Flagyl, vancomycin, cefepime.  Protonix.  1 L LR bolus.  K x2 IV.  2155: Patient has been closely monitored and observed while in the emergency department.  After 1 L LR bolus patient's mentation has improved significantly.  Last few BPs have been 114-122 systolic, temp now 97.8, heart rate normal.  Discussed with hospitalist who will admit for AKI, encephalopathy, SIRS without known source and further work-up.  Discussed with EDP. Final Clinical Impression(s) / ED Diagnoses Final diagnoses:  AKI (acute kidney injury) [facility])    Rx / DC Orders ED Discharge Orders    None       Jerrell Mylar 06/29/20 2201    Terrilee Files, MD 06/30/20 1036

## 2020-06-29 NOTE — ED Notes (Signed)
Bear hugger/warming device down from icu and placed on pt.

## 2020-06-29 NOTE — ED Notes (Signed)
Iv number two obtained via ultrasound, blood culture number one drawn from this iv insertion, blood culture number two via23 gauge butterfly from L wrist on 3rd attempt.

## 2020-06-30 ENCOUNTER — Inpatient Hospital Stay (HOSPITAL_COMMUNITY): Payer: Medicare Other

## 2020-06-30 DIAGNOSIS — K668 Other specified disorders of peritoneum: Secondary | ICD-10-CM

## 2020-06-30 LAB — URINALYSIS, ROUTINE W REFLEX MICROSCOPIC
Bilirubin Urine: NEGATIVE
Glucose, UA: NEGATIVE mg/dL
Ketones, ur: NEGATIVE mg/dL
Nitrite: NEGATIVE
Protein, ur: NEGATIVE mg/dL
Specific Gravity, Urine: 1.013 (ref 1.005–1.030)
pH: 5 (ref 5.0–8.0)

## 2020-06-30 LAB — COMPREHENSIVE METABOLIC PANEL
ALT: 35 U/L (ref 0–44)
AST: 61 U/L — ABNORMAL HIGH (ref 15–41)
Albumin: 2.2 g/dL — ABNORMAL LOW (ref 3.5–5.0)
Alkaline Phosphatase: 64 U/L (ref 38–126)
Anion gap: 8 (ref 5–15)
BUN: 45 mg/dL — ABNORMAL HIGH (ref 8–23)
CO2: 21 mmol/L — ABNORMAL LOW (ref 22–32)
Calcium: 8.2 mg/dL — ABNORMAL LOW (ref 8.9–10.3)
Chloride: 110 mmol/L (ref 98–111)
Creatinine, Ser: 1.61 mg/dL — ABNORMAL HIGH (ref 0.44–1.00)
GFR, Estimated: 33 mL/min — ABNORMAL LOW (ref >60)
Glucose, Bld: 80 mg/dL (ref 70–99)
Potassium: 3.9 mmol/L (ref 3.5–5.1)
Sodium: 139 mmol/L (ref 135–145)
Total Bilirubin: 0.4 mg/dL (ref 0.3–1.2)
Total Protein: 5.1 g/dL — ABNORMAL LOW (ref 6.5–8.1)

## 2020-06-30 LAB — CBC
HCT: 29.8 % — ABNORMAL LOW (ref 36.0–46.0)
Hemoglobin: 8.8 g/dL — ABNORMAL LOW (ref 12.0–15.0)
MCH: 27.2 pg (ref 26.0–34.0)
MCHC: 29.5 g/dL — ABNORMAL LOW (ref 30.0–36.0)
MCV: 92.3 fL (ref 80.0–100.0)
Platelets: 556 10*3/uL — ABNORMAL HIGH (ref 150–400)
RBC: 3.23 MIL/uL — ABNORMAL LOW (ref 3.87–5.11)
RDW: 20.2 % — ABNORMAL HIGH (ref 11.5–15.5)
WBC: 11.2 10*3/uL — ABNORMAL HIGH (ref 4.0–10.5)
nRBC: 0 % (ref 0.0–0.2)

## 2020-06-30 LAB — MAGNESIUM: Magnesium: 1.5 mg/dL — ABNORMAL LOW (ref 1.7–2.4)

## 2020-06-30 LAB — RAPID URINE DRUG SCREEN, HOSP PERFORMED
Amphetamines: NOT DETECTED
Barbiturates: NOT DETECTED
Benzodiazepines: NOT DETECTED
Cocaine: NOT DETECTED
Opiates: POSITIVE — AB
Tetrahydrocannabinol: NOT DETECTED

## 2020-06-30 MED ORDER — PANTOPRAZOLE SODIUM 40 MG IV SOLR
40.0000 mg | Freq: Every day | INTRAVENOUS | Status: DC
  Administered 2020-06-30 – 2020-07-04 (×6): 40 mg via INTRAVENOUS
  Filled 2020-06-30 (×6): qty 40

## 2020-06-30 MED ORDER — OXYCODONE HCL 5 MG PO TABS
15.0000 mg | ORAL_TABLET | Freq: Four times a day (QID) | ORAL | Status: DC | PRN
  Administered 2020-07-03 – 2020-07-06 (×10): 15 mg via ORAL
  Filled 2020-06-30 (×10): qty 3

## 2020-06-30 MED ORDER — ATORVASTATIN CALCIUM 20 MG PO TABS
20.0000 mg | ORAL_TABLET | Freq: Every day | ORAL | Status: DC
  Administered 2020-06-30 – 2020-07-05 (×6): 20 mg via ORAL
  Filled 2020-06-30 (×6): qty 1

## 2020-06-30 MED ORDER — METRONIDAZOLE IN NACL 5-0.79 MG/ML-% IV SOLN
500.0000 mg | Freq: Three times a day (TID) | INTRAVENOUS | Status: DC
  Administered 2020-06-30 – 2020-07-03 (×10): 500 mg via INTRAVENOUS
  Filled 2020-06-30 (×10): qty 100

## 2020-06-30 MED ORDER — ACETAMINOPHEN 650 MG RE SUPP: 650.0000 mg | Freq: Four times a day (QID) | RECTAL | Status: DC | PRN

## 2020-06-30 MED ORDER — ORAL CARE MOUTH RINSE
15.0000 mL | Freq: Two times a day (BID) | OROMUCOSAL | Status: DC
  Administered 2020-06-30 – 2020-07-06 (×11): 15 mL via OROMUCOSAL

## 2020-06-30 MED ORDER — LACTATED RINGERS IV SOLN: INTRAVENOUS | Status: DC

## 2020-06-30 MED ORDER — LORAZEPAM 2 MG/ML IJ SOLN
1.0000 mg | INTRAMUSCULAR | Status: DC | PRN
  Administered 2020-07-03 – 2020-07-04 (×2): 1 mg via INTRAMUSCULAR
  Filled 2020-06-30 (×2): qty 1

## 2020-06-30 MED ORDER — ONDANSETRON HCL 4 MG/2ML IJ SOLN
4.0000 mg | Freq: Four times a day (QID) | INTRAMUSCULAR | Status: DC | PRN
  Administered 2020-07-02: 4 mg via INTRAVENOUS
  Filled 2020-06-30: qty 2

## 2020-06-30 MED ORDER — MAGNESIUM SULFATE 2 GM/50ML IV SOLN
2.0000 g | Freq: Once | INTRAVENOUS | Status: AC
  Administered 2020-06-30: 2 g via INTRAVENOUS
  Filled 2020-06-30: qty 50

## 2020-06-30 MED ORDER — HEPARIN SODIUM (PORCINE) 5000 UNIT/ML IJ SOLN
5000.0000 [IU] | Freq: Three times a day (TID) | INTRAMUSCULAR | Status: DC
  Administered 2020-06-30 – 2020-07-05 (×16): 5000 [IU] via SUBCUTANEOUS
  Filled 2020-06-30 (×17): qty 1

## 2020-06-30 MED ORDER — LORAZEPAM 1 MG PO TABS
1.0000 mg | ORAL_TABLET | ORAL | Status: DC | PRN
  Administered 2020-06-30 – 2020-07-06 (×9): 1 mg via ORAL
  Filled 2020-06-30 (×9): qty 1

## 2020-06-30 MED ORDER — METOPROLOL TARTRATE 25 MG PO TABS
25.0000 mg | ORAL_TABLET | Freq: Two times a day (BID) | ORAL | Status: DC
  Administered 2020-06-30 – 2020-07-06 (×10): 25 mg via ORAL
  Filled 2020-06-30 (×11): qty 1

## 2020-06-30 MED ORDER — ACETAMINOPHEN 325 MG PO TABS
650.0000 mg | ORAL_TABLET | Freq: Four times a day (QID) | ORAL | Status: DC | PRN
  Administered 2020-07-05: 650 mg via ORAL
  Filled 2020-06-30: qty 2

## 2020-06-30 MED ORDER — QUETIAPINE FUMARATE 100 MG PO TABS
200.0000 mg | ORAL_TABLET | Freq: Every day | ORAL | Status: DC
  Administered 2020-06-30 (×2): 200 mg via ORAL
  Filled 2020-06-30 (×2): qty 2

## 2020-06-30 MED ORDER — ONDANSETRON HCL 4 MG PO TABS: 4.0000 mg | ORAL_TABLET | Freq: Four times a day (QID) | ORAL | Status: DC | PRN

## 2020-06-30 MED ORDER — ROPINIROLE HCL 1 MG PO TABS
3.0000 mg | ORAL_TABLET | Freq: Every day | ORAL | Status: DC
  Administered 2020-06-30 (×2): 3 mg via ORAL
  Filled 2020-06-30 (×2): qty 3

## 2020-06-30 MED ORDER — GABAPENTIN 300 MG PO CAPS
600.0000 mg | ORAL_CAPSULE | Freq: Two times a day (BID) | ORAL | Status: DC
  Administered 2020-06-30 – 2020-07-01 (×3): 600 mg via ORAL
  Filled 2020-06-30 (×3): qty 2

## 2020-06-30 NOTE — Plan of Care (Signed)

## 2020-06-30 NOTE — Consult Note (Signed)
Reason for Consult: Pneumoperitoneum, C. difficile colitis Referring Physician: Dr. Arther DamesShah  Tanita Benancio DeedsGail Mull is an 80 y.o. female.  HPI: Patient is a 80 year old white female with multiple medical problems who recently was [facility] with a diagnosis of diarrhea, altered mental status, and possible UTI who presented back to [facility] with altered mental status that had worsened.  She was becoming more confused and intermittently agitated.  She was started on treatment for C. difficile colitis.  Patient gotten increasingly confused and her diarrhea seemed to have worsened.  Family brought her back to [facility] for evaluation and treatment.  Skilled nursing care is now being considered.  She had a CT scan of the abdomen which showed small foci of free air under the diaphragm and in the hernia sac.  History is obtained from hospital notes as the patient recently received Ativan and is somnolent and not responsive.  Past Medical History:  Diagnosis Date  . Chronic back pain   . GERD (gastroesophageal reflux disease)   . Hiatal hernia   . Hyperlipidemia   . Hypertension   . Pelvic fracture (HCC) 12/12/2012  . Smoker     Past Surgical History:  Procedure Laterality Date  . ABDOMINAL HYSTERECTOMY    . APPENDECTOMY    . BALLOON DILATION N/A 06/17/2020   Procedure: BALLOON DILATION;  Surgeon: Malissa Hippoehman, Najeeb U, MD;  [facility]: [facility];  Service: Endoscopy;  Laterality: N/A;  . ESOPHAGOGASTRODUODENOSCOPY (EGD) WITH PROPOFOL N/A 01/17/2020   Procedure: ESOPHAGOGASTRODUODENOSCOPY (EGD) WITH PROPOFOL;  Surgeon: Dolores Frameastaneda Mayorga, Daniel, MD;  [facility];  Service: Gastroenterology;  Laterality: N/A;  . ESOPHAGOGASTRODUODENOSCOPY (EGD) WITH PROPOFOL N/A 06/17/2020   Procedure: ESOPHAGOGASTRODUODENOSCOPY (EGD) WITH PROPOFOL;  Surgeon: Malissa Hippoehman, Najeeb U, MD;  [facility];  Service: Endoscopy;  Laterality: N/A;  . 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  HEMORROIDECTOMY    . LUMBAR SPINE SURGERY    . OOPHORECTOMY    . TONSILLECTOMY      No family history on file.  Social History:  reports that she has quit smoking. Her smoking use included cigarettes. She smoked 0.50 packs per day. She quit smokeless tobacco use about 7 years ago. She reports that she does not drink alcohol and does not use drugs.  Allergies:  Allergies  Allergen Reactions  . Prozac [Fluoxetine Hcl] Other (See Comments)    Makes her fell crazy  . Zoloft [Sertraline Hcl]     Made her feel crazy--10/2013  . Nicoderm [Nicotine] Rash    Site rash    Medications: I have reviewed the patient's current medications.  Results for orders placed or performed during the hospital encounter of 06/29/20 (from the past 48 hour(s))  CBC with Differential/Platelet     Status: Abnormal   Collection Time: 06/29/20  5:22 PM  Result Value Ref Range   WBC 14.9 (H) 4.0 - 10.5 K/uL   RBC 3.44 (L) 3.87 - 5.11 MIL/uL   Hemoglobin 9.4 (L) 12.0 - 15.0 g/dL   HCT 40.931.7 (L) 81.136.0 - 91.446.0 %   MCV 92.2 80.0 - 100.0 fL   MCH 27.3 26.0 - 34.0 pg   MCHC 29.7 (L) 30.0 - 36.0 g/dL   RDW 78.220.3 (H) 95.611.5 - 21.315.5 %   Platelets 638 (H) 150 - 400 K/uL   nRBC 0.0 0.0 - 0.2 %   Neutrophils Relative % 84 %   Neutro Abs 12.5 (H) 1.7 - 7.7 K/uL   Lymphocytes Relative  8 %   Lymphs Abs 1.2 0.7 - 4.0 K/uL   Monocytes Relative 5 %   Monocytes Absolute 0.7 0.1 - 1.0 K/uL   Eosinophils Relative 2 %   Eosinophils Absolute 0.3 0.0 - 0.5 K/uL   Basophils Relative 0 %   Basophils Absolute 0.0 0.0 - 0.1 K/uL   Immature Granulocytes 1 %   Abs Immature Granulocytes 0.18 (H) 0.00 - 0.07 K/uL    Comment: Performed at [facility], [address], [place], [place] [address]  Comprehensive metabolic panel     Status: Abnormal   Collection Time: 06/29/20  5:22 PM  Result Value Ref Range   Sodium 138 135 - 145 mmol/L   Potassium 3.0 (L) 3.5 - 5.1 mmol/L   Chloride 109 98 - 111 mmol/L   CO2 21 (L) 22 - 32 mmol/L    Glucose, Bld 128 (H) 70 - 99 mg/dL    Comment: Glucose reference range applies only to samples taken after fasting for at least 8 hours.   BUN 54 (H) 8 - 23 mg/dL   Creatinine, Ser 6.04 (H) 0.44 - 1.00 mg/dL   Calcium 8.2 (L) 8.9 - 10.3 mg/dL   Total Protein 5.7 (L) 6.5 - 8.1 g/dL   Albumin 2.4 (L) 3.5 - 5.0 g/dL   AST 89 (H) 15 - 41 U/L   ALT 39 0 - 44 U/L   Alkaline Phosphatase 80 38 - 126 U/L   Total Bilirubin 0.3 0.3 - 1.2 mg/dL   GFR, Estimated 22 (L) >60 mL/min    Comment: (NOTE) Calculated using the CKD-EPI Creatinine Equation (2021)    Anion gap 8 5 - 15    Comment: Performed at [facility], [address], [place], [place] [address]  Lipase, blood     Status: None   Collection Time: 06/29/20  5:22 PM  Result Value Ref Range   Lipase 23 11 - 51 U/L    Comment: Performed at [facility], [address], [place], [place] [address]  Ammonia     Status: None   Collection Time: 06/29/20  5:22 PM  Result Value Ref Range   Ammonia 18 9 - 35 umol/L    Comment: Performed at [facility], [address], [place], [place] [address]  Ethanol     Status: None   Collection Time: 06/29/20  5:22 PM  Result Value Ref Range   Alcohol, Ethyl (B) <10 <10 mg/dL    Comment: (NOTE) Lowest detectable limit for serum alcohol is 10 mg/dL.  For medical purposes only. Performed at [facility], [address], [place], [place] [address]   Magnesium     Status: None   Collection Time: 06/29/20  5:22 PM  Result Value Ref Range   Magnesium 1.9 1.7 - 2.4 mg/dL    Comment: Performed at [facility], [address] [place], [place] [address]  Protime-INR     Status: None   Collection Time: 06/29/20  5:22 PM  Result Value Ref Range   Prothrombin Time 15.2 11.4 - 15.2 seconds   INR 1.2 0.8 - 1.2    Comment: (NOTE) INR goal varies based on device and disease states. Performed at [facility], [address], [place], [place] [address]   APTT     Status: Abnormal   Collection Time: 06/29/20  5:22  PM  Result Value Ref Range   aPTT 39 (H) 24 - 36 seconds    Comment:        IF  BASELINE aPTT IS ELEVATED, SUGGEST PATIENT RISK ASSESSMENT BE USED TO DETERMINE APPROPRIATE ANTICOAGULANT THERAPY. Performed at [facility], [address] [place], [place] [address]   Blood culture (routine x 2)     Status: None (Preliminary result)   Collection Time: 06/29/20  6:17 PM   Specimen: BLOOD  Result Value Ref Range   Specimen Description BLOOD RIGHT ANTECUBITAL    Special Requests      BOTTLES DRAWN AEROBIC AND ANAEROBIC Blood Culture adequate volume   Culture      NO GROWTH < 12 HOURS Performed at [facility], [address], [place], [place] [address]    Report Status PENDING   Resp Panel by RT-PCR (Flu A&B, Covid) Nasopharyngeal Swab     Status: Abnormal   Collection Time: 06/29/20  6:20 PM   Specimen: Nasopharyngeal Swab; Nasopharyngeal(NP) swabs in vial transport medium  Result Value Ref Range   SARS Coronavirus 2 by RT PCR NEGATIVE NEGATIVE   Influenza A by PCR RESULTS UNAVAILABLE DUE TO INTERFERING SUBSTANCE (A) NEGATIVE   Influenza B by PCR RESULTS UNAVAILABLE DUE TO INTERFERING SUBSTANCE (A) NEGATIVE    Comment: (NOTE) The Xpert Xpress SARS-CoV-2/FLU/RSV plus assay is intended as an aid in the diagnosis of influenza from Nasopharyngeal swab specimens and should not be used as a sole basis for treatment. Nasal washings and aspirates are unacceptable for Xpert Xpress SARS-CoV-2/FLU/RSV testing.  Fact Sheet for Patients: BloggerCourse.com  Fact Sheet for Healthcare Providers: SeriousBroker.it  This test is not yet approved or cleared by the [place] FDA and has been authorized for detection and/or diagnosis of SARS-CoV-2 by FDA under an Emergency Use Authorization (EUA). This EUA will remain in effect (meaning this test can be used) for the duration of the COVID-19 declaration under Section 564(b)(1) of the Act, 21  U.S.C. section 360bbb-3(b)(1), unless the authorization is terminated or revoked.  Performed at [facility], [address] [place], [place] [address]   Blood culture (routine x 2)     Status: None (Preliminary result)   Collection Time: 06/29/20  6:39 PM   Specimen: BLOOD LEFT WRIST  Result Value Ref Range   Specimen Description BLOOD LEFT WRIST    Special Requests      BOTTLES DRAWN AEROBIC AND ANAEROBIC Blood Culture results may not be optimal due to an inadequate volume of blood received in culture bottles   Culture      NO GROWTH < 12 HOURS Performed at [facility], [address], [place], [place] [address]    Report Status PENDING   Lactic acid, plasma     Status: None   Collection Time: 06/29/20  6:45 PM  Result Value Ref Range   Lactic Acid, Venous 1.4 0.5 - 1.9 mmol/L    Comment: Performed at [facility], [address] [place], [place] [address]  Lactic acid, plasma     Status: None   Collection Time: 06/29/20  8:10 PM  Result Value Ref Range   Lactic Acid, Venous 1.9 0.5 - 1.9 mmol/L    Comment: Performed at [facility], [address], [place], [place] [address]  POC occult blood, ED     Status: Abnormal   Collection Time: 06/29/20  9:06 PM  Result Value Ref Range   Fecal Occult Bld POSITIVE (A) NEGATIVE  Rapid urine drug screen (hospital performed)     Status: Abnormal   Collection Time: 06/30/20  1:29 AM  Result Value Ref Range   Opiates POSITIVE (A) NONE DETECTED  Cocaine NONE DETECTED NONE DETECTED   Benzodiazepines NONE DETECTED NONE DETECTED   Amphetamines NONE DETECTED NONE DETECTED   Tetrahydrocannabinol NONE DETECTED NONE DETECTED   Barbiturates NONE DETECTED NONE DETECTED    Comment: (NOTE) DRUG SCREEN FOR MEDICAL PURPOSES ONLY.  IF CONFIRMATION IS NEEDED FOR ANY PURPOSE, NOTIFY LAB WITHIN 5 DAYS.  LOWEST DETECTABLE LIMITS FOR URINE DRUG SCREEN Drug Class                     Cutoff (ng/mL) Amphetamine and metabolites    1000 Barbiturate and  metabolites    200 Benzodiazepine                 200 Tricyclics and metabolites     300 Opiates and metabolites        300 Cocaine and metabolites        300 THC                            50 Performed at [facility], [address] [place], [place] [address]   Urinalysis, Routine w reflex microscopic     Status: Abnormal   Collection Time: 06/30/20  1:29 AM  Result Value Ref Range   Color, Urine YELLOW YELLOW   APPearance HAZY (A) CLEAR   Specific Gravity, Urine 1.013 1.005 - 1.030   pH 5.0 5.0 - 8.0   Glucose, UA NEGATIVE NEGATIVE mg/dL   Hgb urine dipstick MODERATE (A) NEGATIVE   Bilirubin Urine NEGATIVE NEGATIVE   Ketones, ur NEGATIVE NEGATIVE mg/dL   Protein, ur NEGATIVE NEGATIVE mg/dL   Nitrite NEGATIVE NEGATIVE   Leukocytes,Ua LARGE (A) NEGATIVE   RBC / HPF 6-10 0 - 5 RBC/hpf   WBC, UA 21-50 0 - 5 WBC/hpf   Bacteria, UA RARE (A) NONE SEEN   Squamous Epithelial / LPF 0-5 0 - 5   Mucus PRESENT    Hyaline Casts, UA PRESENT    Ca Oxalate Crys, UA PRESENT    Non Squamous Epithelial 0-5 (A) NONE SEEN    Comment: Performed at [facility], [address] [place], [place] [address]  Comprehensive metabolic panel     Status: Abnormal   Collection Time: 06/30/20  6:22 AM  Result Value Ref Range   Sodium 139 135 - 145 mmol/L   Potassium 3.9 3.5 - 5.1 mmol/L    Comment: DELTA CHECK NOTED   Chloride 110 98 - 111 mmol/L   CO2 21 (L) 22 - 32 mmol/L   Glucose, Bld 80 70 - 99 mg/dL    Comment: Glucose reference range applies only to samples taken after fasting for at least 8 hours.   BUN 45 (H) 8 - 23 mg/dL   Creatinine, Ser 4.53 (H) 0.44 - 1.00 mg/dL   Calcium 8.2 (L) 8.9 - 10.3 mg/dL   Total Protein 5.1 (L) 6.5 - 8.1 g/dL   Albumin 2.2 (L) 3.5 - 5.0 g/dL   AST 61 (H) 15 - 41 U/L   ALT 35 0 - 44 U/L   Alkaline Phosphatase 64 38 - 126 U/L   Total Bilirubin 0.4 0.3 - 1.2 mg/dL   GFR, Estimated 33 (L) >60 mL/min    Comment: (NOTE) Calculated using the CKD-EPI Creatinine  Equation (2021)    Anion gap 8 5 - 15    Comment: Performed at [facility], [address], [place], [place] [address]  Magnesium     Status: Abnormal  Collection Time: 06/30/20  6:22 AM  Result Value Ref Range   Magnesium 1.5 (L) 1.7 - 2.4 mg/dL    Comment: Performed at [facility], [address] [place], [place] [address]  CBC     Status: Abnormal   Collection Time: 06/30/20  6:22 AM  Result Value Ref Range   WBC 11.2 (H) 4.0 - 10.5 K/uL   RBC 3.23 (L) 3.87 - 5.11 MIL/uL   Hemoglobin 8.8 (L) 12.0 - 15.0 g/dL   HCT 32.4 (L) 40.1 - 02.7 %   MCV 92.3 80.0 - 100.0 fL   MCH 27.2 26.0 - 34.0 pg   MCHC 29.5 (L) 30.0 - 36.0 g/dL   RDW 25.3 (H) 66.4 - 40.3 %   Platelets 556 (H) 150 - 400 K/uL   nRBC 0.0 0.0 - 0.2 %    Comment: Performed at [facility], [address], [place], [place] [address]    CT ABDOMEN PELVIS WO CONTRAST  Result Date: 06/30/2020 CLINICAL DATA:  80 year old female with history of hematochezia. Suspected acute diverticulitis. EXAM: CT ABDOMEN AND PELVIS WITHOUT CONTRAST TECHNIQUE: Multidetector CT imaging of the abdomen and pelvis was performed following the standard protocol without IV contrast. COMPARISON:  CT the abdomen and pelvis 06/19/2020. FINDINGS: Lower chest: Atherosclerotic calcifications in the descending thoracic aorta as well as the left main, left anterior descending, left circumflex and right coronary arteries. Calcifications of the mitral annulus and mitral valve. Hepatobiliary: No definite suspicious cystic or solid hepatic lesions are confidently identified on today's noncontrast CT examination. Unenhanced appearance of the gallbladder is normal. Pancreas: No definite pancreatic mass or peripancreatic fluid collections or inflammatory changes. Spleen: Unremarkable. Adrenals/Urinary Tract: Calcification in the left renal hilum appears to be vascular. No definite suspicious renal lesions. Unenhanced appearance of the kidneys and bilateral adrenal glands is  normal. No hydroureteronephrosis. Urinary bladder is normal in appearance. Stomach/Bowel: Postoperative changes of Roux-en-Y gastric bypass. No pathologic dilatation of small bowel or colon. Numerous colonic diverticulae are noted, without surrounding inflammatory changes to clearly indicate an acute diverticulitis at this time. The appendix is not confidently identified and may be surgically absent. Regardless, there are no inflammatory changes noted adjacent to the cecum to suggest the presence of an acute appendicitis at this time. Vascular/Lymphatic: Aortic atherosclerosis. No lymphadenopathy noted in the abdomen or pelvis. Reproductive: Status post hysterectomy. Ovaries are not confidently identified may be surgically absent or atrophic. Other: Small volume of pneumoperitoneum evident beneath both of the diaphragms, and within the patient's ventral hernia which otherwise contains predominantly fat. No significant volume of ascites. Musculoskeletal: Old healed fractures of the right superior and inferior pubic rami with posttraumatic deformity. Status post PLIF at L5-S1 with 1.3 cm of anterolisthesis of L5 upon S1. Chronic appearing compression fracture of T9 with complete loss of anterior vertebral body height. IMPRESSION: 1. Today's study is positive for pneumoperitoneum. The source of pneumoperitoneum is not readily apparent on today's examination, but the presence of pneumoperitoneum raises concern for potential bowel perforation. 2. Colonic diverticulosis without definitive findings to suggest an acute diverticulitis at this time. 3. Aortic atherosclerosis, in addition to left main and 3 vessel coronary artery disease. Assessment for potential risk factor modification, dietary therapy or pharmacologic therapy may be warranted, if clinically indicated. 4. There are calcifications of the mitral annulus/valve. Echocardiographic correlation for evaluation of potential valvular dysfunction may be warranted if  clinically indicated. 5. Additional postoperative changes and incidental finding, as above. Critical Value/emergent results were called by telephone at  the time of interpretation on 06/30/2020 at 2:43 pm to provider Dr. Maurilio Lovely, who verbally acknowledged these results. Electronically Signed   By: Trudie Reed M.D.   On: 06/30/2020 14:43   CT Head Wo Contrast  Result Date: 06/29/2020 CLINICAL DATA:  Fall altered EXAM: CT HEAD WITHOUT CONTRAST TECHNIQUE: Contiguous axial images were obtained from the base of the skull through the vertex without intravenous contrast. COMPARISON:  MRI 06/19/2020, CT brain 06/18/2020 FINDINGS: Brain: No acute territorial infarction, hemorrhage or intracranial mass. Mild atrophy. Moderate white matter hypodensity consistent with chronic small vessel ischemic change. Chronic lacunar infarcts within the bilateral basal ganglia. Stable ventricle size Vascular: No hyperdense vessels.  Carotid vascular calcification Skull: Normal. Negative for fracture or focal lesion. Sinuses/Orbits: No acute finding. Other: None IMPRESSION: 1. No CT evidence for acute intracranial abnormality. 2. Atrophy and chronic small vessel ischemic changes of the white matter. Electronically Signed   By: Jasmine Pang M.D.   On: 06/29/2020 20:38   DG Chest Port 1 View  Result Date: 06/29/2020 CLINICAL DATA:  Code sepsis altered week EXAM: PORTABLE CHEST 1 VIEW COMPARISON:  06/19/2020 FINDINGS: Low lung volumes. Atelectasis or scarring in the left lower lung. No focal consolidation or effusion. Stable enlarged cardiomediastinal silhouette with aortic atherosclerosis. No pneumothorax. Chronic bilateral shoulder deformities. IMPRESSION: Low lung volumes with atelectasis or scarring in the left lower lung. Cardiomegaly Electronically Signed   By: Jasmine Pang M.D.   On: 06/29/2020 18:17    ROS:  Review of systems not obtained due to patient factors.  Blood pressure 129/72, pulse (!) 50, temperature  97.7 F (36.5 C), temperature source Oral, resp. rate 18, height 5' (1.524 m), weight 64.2 kg, SpO2 95 %. Physical Exam: Resting white female no acute distress Lungs clear to auscultation with your breath sounds bilaterally Heart examination reveals a regular rate and rhythm. Abdomen is soft, nontender, nondistended.  No rigidity is noted.  A reducible supra umbilical ventral hernia is present. Labs reviewed CT scan images personally reviewed Assessment/Plan: Impression: Pneumoperitoneum of unknown etiology.  Patient does not appear to have an acute abdomen at this point.  Etiologies of her pneumoperitoneum include perforated diverticulum, translocation from colitis, or perforated viscus.  Patient has multiple comorbidities which would make any surgical intervention high risk.  I do not think she needs to go to surgery at this point.  Would continue treatment for her C. difficile colitis and keep her on clear liquid diet for now.  We will monitor closely with you.  Discussed with Dr. Sherryll Burger.  Franky Macho 06/30/2020, 5:44 PM

## 2020-06-30 NOTE — Progress Notes (Addendum)
PROGRESS NOTE    Brenda Hopkins  NAT:[id number] DOB: 02-17-1941 DOA: 06/29/2020 PCP: Samuella Bruin   Brief Narrative:   Brenda Hopkins  is a 80 y.o. female, with history of hypertension, hyperlipidemia, hiatal hernia, GERD, chronic back pain presents the ED today with a chief complaint of diarrhea, possible UTI, altered mental status, and falls.  Patient was recently discharged from the hospital on April 13.  At that time she was offered SNF placement but refused.  Patient was admitted with acute encephalopathy with SIRS criteria in the setting of recent C. difficile colitis.  She is now noted to have pneumoperitoneum as well as AKI.  PT recommending SNF placement.  Assessment & Plan:   Active Problems:   AKI (acute kidney injury) (HCC)   Hypoalbuminemia   Acute metabolic encephalopathy   SIRS (systemic inflammatory response syndrome) (HCC)   Hypokalemia   Falls   SIRS criteria in the setting of pneumoperitoneum -Recent C. difficile colitis with potentially a microperforation -Consult to general surgery appreciated -Bowel movements have diminished -Continue on cefepime and Flagyl given the CT finding -Finished oral vancomycin dosing with no further significant diarrhea noted  Acute metabolic encephalopathy secondary to above with recurrent falls -Continue to monitor closely -PT recommending SNF on discharge -Continue with Seroquel  AKI-improving -Likely prerenal in the setting of GI loss -Continue IV hydration and continue to follow  Hypomagnesemia -Replete and recheck in am  Hypertension -Continue metoprolol  GERD -IV PPI daily  Acute on chronic anemia -Check anemia panel -No overt bleeding -Follow stool occult   DVT prophylaxis: Heparin Code Status: DNR Family Communication: Discussed with daughter on phone 4/19 Disposition Plan:  Status is: Inpatient  Remains inpatient appropriate because:IV treatments appropriate due to intensity of illness  or inability to take PO and Inpatient level of care appropriate due to severity of illness   Dispo: The patient is from: Home              Anticipated d/c is to: SNF              Patient currently is not medically stable to d/c.   Difficult to place patient No   Consultants:   General Surgery  Procedures:   See below  Antimicrobials:  Anti-infectives (From admission, onward)   Start     Dose/Rate Route Frequency Ordered Stop   07/01/20 1800  vancomycin (VANCOREADY) IVPB 750 mg/150 mL        750 mg 150 mL/hr over 60 Minutes Intravenous Every 48 hours 06/29/20 1959     06/30/20 1800  ceFEPIme (MAXIPIME) 2 g in sodium chloride 0.9 % 100 mL IVPB        2 g 200 mL/hr over 30 Minutes Intravenous Every 24 hours 06/29/20 1959     06/30/20 0400  metroNIDAZOLE (FLAGYL) IVPB 500 mg        500 mg 100 mL/hr over 60 Minutes Intravenous Every 8 hours 06/30/20 0112     06/29/20 1745  ceFEPIme (MAXIPIME) 2 g in sodium chloride 0.9 % 100 mL IVPB        2 g 200 mL/hr over 30 Minutes Intravenous  Once 06/29/20 1744 06/29/20 1923   06/29/20 1745  metroNIDAZOLE (FLAGYL) IVPB 500 mg        500 mg 100 mL/hr over 60 Minutes Intravenous  Once 06/29/20 1744 06/29/20 2056   06/29/20 1745  vancomycin (VANCOCIN) IVPB 1000 mg/200 mL premix        1,000 mg  200 mL/hr over 60 Minutes Intravenous  Once 06/29/20 1744 06/29/20 2056       Subjective: Patient seen and evaluated today with one large BM this am. She continues to have some ongoing confusion and agitation.  Objective: Vitals:   06/30/20 0001 06/30/20 0200 06/30/20 0648 06/30/20 1455  BP: (!) 155/70 128/79 136/69 129/72  Pulse: (!) 51 62 64 (!) 50  Resp: 20 20 20 18   Temp: (!) 97.3 F (36.3 C) 97.7 F (36.5 C) (!) 97.5 F (36.4 C) 97.7 F (36.5 C)  TempSrc: Oral Oral Oral Oral  SpO2:  92% 97% 95%  Weight: 64.2 kg     Height: 5' (1.524 m)       Intake/Output Summary (Last 24 hours) at 06/30/2020 1513 Last data filed at 06/30/2020  0900 Gross per 24 hour  Intake 2119.77 ml  Output --  Net 2119.77 ml   Filed Weights   06/30/20 0001  Weight: 64.2 kg    Examination:  General exam: Appears moderately distressed and confused Respiratory system: Clear to auscultation. Respiratory effort normal. Cardiovascular system: S1 & S2 heard, RRR.  Gastrointestinal system: Abdomen is soft, non-tender Central nervous system: Alert and awake Extremities: No edema Skin: No significant lesions noted Psychiatry: Flat affect.    Data Reviewed: I have personally reviewed following labs and imaging studies  CBC: Recent Labs  Lab 06/24/20 0425 06/29/20 1722 06/30/20 0622  WBC 8.8 14.9* 11.2*  NEUTROABS  --  12.5*  --   HGB 10.3* 9.4* 8.8*  HCT 36.2 31.7* 29.8*  MCV 92.6 92.2 92.3  PLT 399 638* 556*   Basic Metabolic Panel: Recent Labs  Lab 06/24/20 0425 06/29/20 1722 06/30/20 0622  NA 141 138 139  K 3.3* 3.0* 3.9  CL 108 109 110  CO2 23 21* 21*  GLUCOSE 145* 128* 80  BUN 19 54* 45*  CREATININE 1.00 2.21* 1.61*  CALCIUM 8.4* 8.2* 8.2*  MG 2.2 1.9 1.5*   GFR: Estimated Creatinine Clearance: 24.5 mL/min (A) (by C-G formula based on SCr of 1.61 mg/dL (H)). Liver Function Tests: Recent Labs  Lab 06/24/20 0425 06/29/20 1722 06/30/20 0622  AST 12* 89* 61*  ALT 14 39 35  ALKPHOS 86 80 64  BILITOT 0.5 0.3 0.4  PROT 5.9* 5.7* 5.1*  ALBUMIN 2.7* 2.4* 2.2*   Recent Labs  Lab 06/29/20 1722  LIPASE 23   Recent Labs  Lab 06/29/20 1722  AMMONIA 18   Coagulation Profile: Recent Labs  Lab 06/29/20 1722  INR 1.2   Cardiac Enzymes: No results for input(s): CKTOTAL, CKMB, CKMBINDEX, TROPONINI in the last 168 hours. BNP (last 3 results) No results for input(s): PROBNP in the last 8760 hours. HbA1C: No results for input(s): HGBA1C in the last 72 hours. CBG: No results for input(s): GLUCAP in the last 168 hours. Lipid Profile: No results for input(s): CHOL, HDL, LDLCALC, TRIG, CHOLHDL, LDLDIRECT in  the last 72 hours. Thyroid Function Tests: No results for input(s): TSH, T4TOTAL, FREET4, T3FREE, THYROIDAB in the last 72 hours. Anemia Panel: No results for input(s): VITAMINB12, FOLATE, FERRITIN, TIBC, IRON, RETICCTPCT in the last 72 hours. Sepsis Labs: Recent Labs  Lab 06/29/20 1845 06/29/20 2010  LATICACIDVEN 1.4 1.9    Recent Results (from the past 240 hour(s))  C. Diff by PCR     Status: Abnormal   Collection Time: 06/21/20  8:52 PM   Specimen: STOOL  Result Value Ref Range Status   Toxigenic C. Difficile by PCR POSITIVE (A)  NEGATIVE Final    Comment: Positive for toxigenic C. difficile with little to no toxin production. Only treat if clinical presentation suggests symptomatic illness. Performed at [facility], [address] [place], [place] [address]   Blood culture (routine x 2)     Status: None (Preliminary result)   Collection Time: 06/29/20  6:17 PM   Specimen: BLOOD  Result Value Ref Range Status   Specimen Description BLOOD RIGHT ANTECUBITAL  Final   Special Requests   Final    BOTTLES DRAWN AEROBIC AND ANAEROBIC Blood Culture adequate volume   Culture   Final    NO GROWTH < 12 HOURS Performed at [facility], [address], [place], [place] [address]    Report Status PENDING  Incomplete  Resp Panel by RT-PCR (Flu A&B, Covid) Nasopharyngeal Swab     Status: Abnormal   Collection Time: 06/29/20  6:20 PM   Specimen: Nasopharyngeal Swab; Nasopharyngeal(NP) swabs in vial transport medium  Result Value Ref Range Status   SARS Coronavirus 2 by RT PCR NEGATIVE NEGATIVE Final   Influenza A by PCR RESULTS UNAVAILABLE DUE TO INTERFERING SUBSTANCE (A) NEGATIVE Final   Influenza B by PCR RESULTS UNAVAILABLE DUE TO INTERFERING SUBSTANCE (A) NEGATIVE Final    Comment: (NOTE) The Xpert Xpress SARS-CoV-2/FLU/RSV plus assay is intended as an aid in the diagnosis of influenza from Nasopharyngeal swab specimens and should not be used as a sole basis for treatment.  Nasal washings and aspirates are unacceptable for Xpert Xpress SARS-CoV-2/FLU/RSV testing.  Fact Sheet for Patients: BloggerCourse.com  Fact Sheet for Healthcare Providers: SeriousBroker.it  This test is not yet approved or cleared by the [place] FDA and has been authorized for detection and/or diagnosis of SARS-CoV-2 by FDA under an Emergency Use Authorization (EUA). This EUA will remain in effect (meaning this test can be used) for the duration of the COVID-19 declaration under Section 564(b)(1) of the Act, 21 U.S.C. section 360bbb-3(b)(1), unless the authorization is terminated or revoked.  Performed at [facility], [address], [place], [place] [address]   Blood culture (routine x 2)     Status: None (Preliminary result)   Collection Time: 06/29/20  6:39 PM   Specimen: BLOOD LEFT WRIST  Result Value Ref Range Status   Specimen Description BLOOD LEFT WRIST  Final   Special Requests   Final    BOTTLES DRAWN AEROBIC AND ANAEROBIC Blood Culture results may not be optimal due to an inadequate volume of blood received in culture bottles   Culture   Final    NO GROWTH < 12 HOURS Performed at [facility], [address] [place], [place] [address]    Report Status PENDING  Incomplete         Radiology Studies: CT ABDOMEN PELVIS WO CONTRAST  Result Date: 06/30/2020 CLINICAL DATA:  80 year old female with history of hematochezia. Suspected acute diverticulitis. EXAM: CT ABDOMEN AND PELVIS WITHOUT CONTRAST TECHNIQUE: Multidetector CT imaging of the abdomen and pelvis was performed following the standard protocol without IV contrast. COMPARISON:  CT the abdomen and pelvis 06/19/2020. FINDINGS: Lower chest: Atherosclerotic calcifications in the descending thoracic aorta as well as the left main, left anterior descending, left circumflex and right coronary arteries. Calcifications of the mitral annulus and mitral valve.  Hepatobiliary: No definite suspicious cystic or solid hepatic lesions are confidently identified on today's noncontrast CT examination. Unenhanced appearance of the gallbladder is normal. Pancreas: No definite pancreatic mass or peripancreatic fluid collections or inflammatory changes. Spleen:  Unremarkable. Adrenals/Urinary Tract: Calcification in the left renal hilum appears to be vascular. No definite suspicious renal lesions. Unenhanced appearance of the kidneys and bilateral adrenal glands is normal. No hydroureteronephrosis. Urinary bladder is normal in appearance. Stomach/Bowel: Postoperative changes of Roux-en-Y gastric bypass. No pathologic dilatation of small bowel or colon. Numerous colonic diverticulae are noted, without surrounding inflammatory changes to clearly indicate an acute diverticulitis at this time. The appendix is not confidently identified and may be surgically absent. Regardless, there are no inflammatory changes noted adjacent to the cecum to suggest the presence of an acute appendicitis at this time. Vascular/Lymphatic: Aortic atherosclerosis. No lymphadenopathy noted in the abdomen or pelvis. Reproductive: Status post hysterectomy. Ovaries are not confidently identified may be surgically absent or atrophic. Other: Small volume of pneumoperitoneum evident beneath both of the diaphragms, and within the patient's ventral hernia which otherwise contains predominantly fat. No significant volume of ascites. Musculoskeletal: Old healed fractures of the right superior and inferior pubic rami with posttraumatic deformity. Status post PLIF at L5-S1 with 1.3 cm of anterolisthesis of L5 upon S1. Chronic appearing compression fracture of T9 with complete loss of anterior vertebral body height. IMPRESSION: 1. Today's study is positive for pneumoperitoneum. The source of pneumoperitoneum is not readily apparent on today's examination, but the presence of pneumoperitoneum raises concern for potential  bowel perforation. 2. Colonic diverticulosis without definitive findings to suggest an acute diverticulitis at this time. 3. Aortic atherosclerosis, in addition to left main and 3 vessel coronary artery disease. Assessment for potential risk factor modification, dietary therapy or pharmacologic therapy may be warranted, if clinically indicated. 4. There are calcifications of the mitral annulus/valve. Echocardiographic correlation for evaluation of potential valvular dysfunction may be warranted if clinically indicated. 5. Additional postoperative changes and incidental finding, as above. Critical Value/emergent results were called by telephone at the time of interpretation on 06/30/2020 at 2:43 pm to provider Dr. Maurilio LovelyPratik Herberth Deharo, who verbally acknowledged these results. Electronically Signed   By: Trudie Reedaniel  Entrikin M.D.   On: 06/30/2020 14:43   CT Head Wo Contrast  Result Date: 06/29/2020 CLINICAL DATA:  Fall altered EXAM: CT HEAD WITHOUT CONTRAST TECHNIQUE: Contiguous axial images were obtained from the base of the skull through the vertex without intravenous contrast. COMPARISON:  MRI 06/19/2020, CT brain 06/18/2020 FINDINGS: Brain: No acute territorial infarction, hemorrhage or intracranial mass. Mild atrophy. Moderate white matter hypodensity consistent with chronic small vessel ischemic change. Chronic lacunar infarcts within the bilateral basal ganglia. Stable ventricle size Vascular: No hyperdense vessels.  Carotid vascular calcification Skull: Normal. Negative for fracture or focal lesion. Sinuses/Orbits: No acute finding. Other: None IMPRESSION: 1. No CT evidence for acute intracranial abnormality. 2. Atrophy and chronic small vessel ischemic changes of the white matter. Electronically Signed   By: Jasmine PangKim  Fujinaga M.D.   On: 06/29/2020 20:38   DG Chest Port 1 View  Result Date: 06/29/2020 CLINICAL DATA:  Code sepsis altered week EXAM: PORTABLE CHEST 1 VIEW COMPARISON:  06/19/2020 FINDINGS: Low lung volumes.  Atelectasis or scarring in the left lower lung. No focal consolidation or effusion. Stable enlarged cardiomediastinal silhouette with aortic atherosclerosis. No pneumothorax. Chronic bilateral shoulder deformities. IMPRESSION: Low lung volumes with atelectasis or scarring in the left lower lung. Cardiomegaly Electronically Signed   By: Jasmine PangKim  Fujinaga M.D.   On: 06/29/2020 18:17        Scheduled Meds: . atorvastatin  20 mg Oral QHS  . gabapentin  600 mg Oral BID  . heparin  5,000 Units Subcutaneous Q8H  .  mouth rinse  15 mL Mouth Rinse BID  . metoprolol tartrate  25 mg Oral BID  . QUEtiapine  200 mg Oral QHS  . rOPINIRole  3 mg Oral QHS   Continuous Infusions: . ceFEPime (MAXIPIME) IV    . metronidazole 500 mg (06/30/20 1424)  . [START ON 07/01/2020] vancomycin       LOS: 1 day    Time spent: 35 minutes    Kara Mierzejewski Hoover Brunette, DO [facility]  If 7PM-7AM, please contact night-coverage [email or web address] 06/30/2020, 3:13 PM

## 2020-06-30 NOTE — NC FL2 (Signed)
[place] MEDICAID FL2 LEVEL OF CARE SCREENING TOOL     IDENTIFICATION  Patient Name: Brenda Hopkins Birthdate: 11/21/1940 Sex: female Admission Date (Current Location): 06/29/2020  [facility] and [place] Number:  [employer] and Address:  [facility],  [address] [place] [address]      Provider Number: [id number]  Attending Physician Name and Address:  Erick Blinks, DO  Relative Name and Phone Number:       Current Level of Care: SNF Recommended Level of Care: Skilled Nursing Facility Prior Approval Number:    Date Approved/Denied:   PASRR Number: [id number]  Discharge Plan: SNF    Current Diagnoses: Patient Active Problem List   Diagnosis Date Noted  . Hypokalemia 06/29/2020  . Falls 06/29/2020  . Physical deconditioning   . SIRS (systemic inflammatory response syndrome) (HCC) 06/19/2020  . Septic shock (HCC)   . Proctitis   . Acute metabolic encephalopathy 06/18/2020  . Hematochezia 06/18/2020  . Abdominal pain   . Anemia   . Acute on chronic diastolic CHF (congestive heart failure) (HCC) 06/12/2020  . Gi Tract---Marginal ulcer 01/30/2020  . Noncompliance with medication regimen 01/30/2020  . AKI (acute kidney injury) (HCC) 01/30/2020  . Dehydration 01/30/2020  . Hypoalbuminemia 01/30/2020  . Obesity (BMI 30.0-34.9) 01/30/2020  . Nausea 01/30/2020  . Acute respiratory failure with hypoxia (HCC) 01/30/2020  . Epigastric pain 01/16/2020  . Ventral hernia without obstruction or gangrene   . Intractable epigastric abdominal pain 01/15/2020  . Insomnia 05/13/2014  . Generalized anxiety disorder 07/15/2013  . Hypertension   . Chronic back pain   . Hyperlipidemia   . GERD (gastroesophageal reflux disease)   . Hiatal hernia   . Smoker   . Pelvic fracture (HCC) 12/12/2012    Orientation RESPIRATION BLADDER Height & Weight     Self,Situation,Place  Normal External catheter Weight: 141 lb 8 oz (64.2 kg) Height:  5' (152.4  cm)  BEHAVIORAL SYMPTOMS/MOOD NEUROLOGICAL BOWEL NUTRITION STATUS      Incontinent Diet (Clear liquid. See d/c for updates.)  AMBULATORY STATUS COMMUNICATION OF NEEDS Skin   Extensive Assist Verbally Skin abrasions,Other (Comment),Bruising (excoriated skin. Rash to perineum/sacrum.)                       Personal Care Assistance Level of Assistance  Bathing,Dressing,Feeding Bathing Assistance: Maximum assistance Feeding assistance: Limited assistance Dressing Assistance: Maximum assistance     Functional Limitations Info  Sight,Hearing,Speech Sight Info: Impaired Hearing Info: Adequate Speech Info: Adequate    SPECIAL CARE FACTORS FREQUENCY  PT (By licensed PT)     PT Frequency: 5x weekly              Contractures      Additional Factors Info  Code Status,Allergies,Psychotropic,Isolation Precautions Code Status Info: DNR Allergies Info: Prozac (fluoxetine Hcl), Zoloft (sertraline Hcl), Nicoderm (nicotine) Psychotropic Info: Seroquel, Zoloft   Isolation Precautions Info: Enteric precautions     Current Medications (06/30/2020):  This is the current hospital active medication list Current Facility-Administered Medications  Medication Dose Route Frequency Provider Last Rate Last Admin  . acetaminophen (TYLENOL) tablet 650 mg  650 mg Oral Q6H PRN Zierle-Ghosh, Asia B, DO       Or  . acetaminophen (TYLENOL) suppository 650 mg  650 mg Rectal Q6H PRN Zierle-Ghosh, Asia B, DO      . atorvastatin (LIPITOR) tablet 20 mg  20 mg Oral QHS Zierle-Ghosh, Asia B, DO      .  ceFEPIme (MAXIPIME) 2 g in sodium chloride 0.9 % 100 mL IVPB  2 g Intravenous Q24H Zierle-Ghosh, Asia B, DO      . gabapentin (NEURONTIN) capsule 600 mg  600 mg Oral BID Zierle-Ghosh, Asia B, DO   600 mg at 06/30/20 0843  . heparin injection 5,000 Units  5,000 Units Subcutaneous Q8H Zierle-Ghosh, Asia B, DO      . LORazepam (ATIVAN) tablet 1 mg  1 mg Oral Q4H PRN Sherryll Burger, Pratik D, DO   1 mg at 06/30/20 1125    Or  . LORazepam (ATIVAN) injection 1 mg  1 mg Intramuscular Q4H PRN Sherryll Burger, Pratik D, DO      . MEDLINE mouth rinse  15 mL Mouth Rinse BID Zierle-Ghosh, Asia B, DO   15 mL at 06/30/20 0843  . metoprolol tartrate (LOPRESSOR) tablet 25 mg  25 mg Oral BID Zierle-Ghosh, Asia B, DO   25 mg at 06/30/20 0843  . metroNIDAZOLE (FLAGYL) IVPB 500 mg  500 mg Intravenous Q8H Zierle-Ghosh, Asia B, DO 100 mL/hr at 06/30/20 0425 500 mg at 06/30/20 0425  . ondansetron (ZOFRAN) tablet 4 mg  4 mg Oral Q6H PRN Zierle-Ghosh, Asia B, DO       Or  . ondansetron (ZOFRAN) injection 4 mg  4 mg Intravenous Q6H PRN Zierle-Ghosh, Asia B, DO      . oxyCODONE (Oxy IR/ROXICODONE) immediate release tablet 15 mg  15 mg Oral Q6H PRN Zierle-Ghosh, Asia B, DO      . QUEtiapine (SEROQUEL) tablet 200 mg  200 mg Oral QHS Zierle-Ghosh, Asia B, DO   200 mg at 06/30/20 0206  . rOPINIRole (REQUIP) tablet 3 mg  3 mg Oral QHS Zierle-Ghosh, Asia B, DO   3 mg at 06/30/20 0208  . [START ON 07/01/2020] vancomycin (VANCOREADY) IVPB 750 mg/150 mL  750 mg Intravenous Q48H Zierle-Ghosh, Asia B, DO         Discharge Medications: Please see discharge summary for a list of discharge medications.  Relevant Imaging Results:  Relevant Lab Results:   Additional Information SSN: [id number]  Karn Cassis, LCSW

## 2020-06-30 NOTE — Plan of Care (Signed)
  Problem: Acute Rehab PT Goals(only PT should resolve) Goal: Pt Will Go Supine/Side To Sit Outcome: Progressing Flowsheets (Taken 06/30/2020 1354) Pt will go Supine/Side to Sit:  with supervision  with modified independence Goal: Patient Will Transfer Sit To/From Stand Outcome: Progressing Flowsheets (Taken 06/30/2020 1354) Patient will transfer sit to/from stand:  with min guard assist  with minimal assist Goal: Pt Will Transfer Bed To Chair/Chair To Bed Outcome: Progressing Flowsheets (Taken 06/30/2020 1354) Pt will Transfer Bed to Chair/Chair to Bed:  min guard assist  with min assist Goal: Pt Will Ambulate Outcome: Progressing Flowsheets (Taken 06/30/2020 1354) Pt will Ambulate:  50 feet  with minimal assist  with rolling walker   1:55 PM, 06/30/20 Ocie Bob, MPT Physical Therapist with [facility] 336 [phone] office [phone] mobile phone

## 2020-06-30 NOTE — Progress Notes (Signed)
Spoke with Irving Burton, RN regarding ML order.  Aware that ML will not be placed until 4/20.  Irving Burton states that patient currently has IV access and ML is needed for poor veins.

## 2020-06-30 NOTE — TOC Initial Note (Signed)
Transition of Care [facility]) - Initial/Assessment Note    Patient Details  Name: Brenda Hopkins MRN: [id number] Date of Birth: November 09, 1940  Transition of Care [facility]) CM/SW Contact:    Leitha Bleak, RN Phone Number: 06/30/2020, 3:15 PM  Clinical Narrative:    Patient admitted with acute metabolic encephalopathy. Second Admission this month. Last visit patient went home with her daughter and Home health, refused SNF. PT is recommending SNF. Family is agreeable. FL2 completed and sent out for bed offers.               Expected Discharge Plan: Skilled Nursing Facility Barriers to Discharge: Continued Medical Work up   Patient Goals and CMS Choice Patient states their goals for this hospitalization and ongoing recovery are:: to go to rehab. CMS Medicare.gov Compare Post Acute Care list provided to:: Patient Represenative (must comment) Choice offered to / list presented to : Adult Children  Expected Discharge Plan and Services Expected Discharge Plan: Skilled Nursing Facility    Living arrangements for the past 2 months: Single Family Home        Prior Living Arrangements/Services Living arrangements for the past 2 months: Single Family Home Lives with:: Adult Children Patient language and need for interpreter reviewed:: Yes        Need for Family Participation in Patient Care: Yes (Comment) Care giver support system in place?: Yes (comment)   Criminal Activity/Legal Involvement Pertinent to Current Situation/Hospitalization: No - Comment as needed  Activities of Daily Living Home Assistive Devices/Equipment: Shower chair without back,Walker (specify type) ADL Screening (condition at time of admission) Patient's cognitive ability adequate to safely complete daily activities?: No Is the patient deaf or have difficulty hearing?: No Does the patient have difficulty seeing, even when wearing glasses/contacts?: No Does the patient have difficulty concentrating, remembering, or  making decisions?: Yes Patient able to express need for assistance with ADLs?: Yes Does the patient have difficulty dressing or bathing?: Yes Independently performs ADLs?: No Communication: Independent Dressing (OT): Needs assistance Is this a change from baseline?: Pre-admission baseline Grooming: Needs assistance Is this a change from baseline?: Pre-admission baseline Feeding: Independent Bathing: Needs assistance Is this a change from baseline?: Pre-admission baseline Toileting: Dependent Is this a change from baseline?: Pre-admission baseline In/Out Bed: Needs assistance Is this a change from baseline?: Pre-admission baseline Walks in Home: Needs assistance Is this a change from baseline?: Pre-admission baseline Does the patient have difficulty walking or climbing stairs?: Yes Weakness of Legs: Both Weakness of Arms/Hands: Both  Permission Sought/Granted      Emotional Assessment      Alcohol / Substance Use: Not Applicable Psych Involvement: No (comment)  Admission diagnosis:  SIRS (systemic inflammatory response syndrome) (HCC) [R65.10] AKI (acute kidney injury) (HCC) [N17.9] Patient Active Problem List   Diagnosis Date Noted  . Hypokalemia 06/29/2020  . Falls 06/29/2020  . Physical deconditioning   . SIRS (systemic inflammatory response syndrome) (HCC) 06/19/2020  . Septic shock (HCC)   . Proctitis   . Acute metabolic encephalopathy 06/18/2020  . Hematochezia 06/18/2020  . Abdominal pain   . Anemia   . Acute on chronic diastolic CHF (congestive heart failure) (HCC) 06/12/2020  . Gi Tract---Marginal ulcer 01/30/2020  . Noncompliance with medication regimen 01/30/2020  . AKI (acute kidney injury) (HCC) 01/30/2020  . Dehydration 01/30/2020  . Hypoalbuminemia 01/30/2020  . Obesity (BMI 30.0-34.9) 01/30/2020  . Nausea 01/30/2020  . Acute respiratory failure with hypoxia (HCC) 01/30/2020  . Epigastric pain 01/16/2020  . Ventral hernia  without obstruction or  gangrene   . Intractable epigastric abdominal pain 01/15/2020  . Insomnia 05/13/2014  . Generalized anxiety disorder 07/15/2013  . Hypertension   . Chronic back pain   . Hyperlipidemia   . GERD (gastroesophageal reflux disease)   . Hiatal hernia   . Smoker   . Pelvic fracture (HCC) 12/12/2012   PCP:  Shawnie Dapper, PA-C Pharmacy:   [facility] [phone] - [place], [place] - [address] [place] [address] Phone: [phone] Fax: [phone]  Readmission Risk Interventions Readmission Risk Prevention Plan 06/30/2020  Transportation Screening Complete  HRI or Home Care Consult Complete  SW Recovery Care/Counseling Consult Complete  Palliative Care Screening Not Applicable  Skilled Nursing Facility Complete  Some recent data might be hidden

## 2020-06-30 NOTE — Evaluation (Signed)
Physical Therapy Evaluation Patient Details Name: Brenda Hopkins MRN: [id number] DOB: 1940-05-01 Today's Date: 06/30/2020   History of Present Illness  Brenda Hopkins  is a 80 y.o. female, with history of hypertension, hyperlipidemia, hiatal hernia, GERD, chronic back pain presents the ED today with a chief complaint of diarrhea, possible UTI, altered mental status, and falls.  Patient was recently discharged from the hospital on April 13.  At that time she was offered SNF placement but refused.  At that hospitalization she presented for altered mental status as well.  It was described as somnolence at that time.  When she became more awake she was pleasantly confused and intermittently agitated.  She had a CT of her brain at that time that showed small focus of cortical hypodensity in the right occipital lobe.  MRI brain did not show any acute findings.  She had acute on chronic CHF at that hospitalization as well with respiratory failure and hypoxia.  She had hematochezia and colitis as well.  It looks like she had a positive C. difficile on April 8 and was started on oral Vanco.  GI stool pathogen panel was negative.  In the hospital she did receive cefepime, Augmentin, metronidazole.  She had a lobar pneumonia and an AKI at that time as well.  Her baseline creatinine was noted to be 0.6-0.9 and was as high as 1.74 during that admission.  She was seen by PT at that hospitalization who recommended physical therapy at skilled nursing facility, but again patient declined.  She returns today confused and with continued diarrhea.  Family reports that she has had 3 falls since being at home, not eating, and family is concerned that they cannot take care of her.  They noted her to have watery brown stool which is consistent with the ED providers exam.  FOBT was positive, although still did not look melanotic or have overt blood.  She did have p.o. skin over her sacrum presumably from diarrhea.  ED notes that  patient was altered at arrival.  She was not responding and just staring at the ceiling.  When her daughter came to bedside she was responsive, and then when her daughter left she became more confused, trying to get out of bed, became irritable and agitated.    Clinical Impression  Patient demonstrates slow labored unsteady cadence, limited mostly due to fatigue/generalized weakness and at high risk for falls due to BLE weakness.  Patient tolerated sitting up in chair after therapy with RN in room to start an IV.  Patient will benefit from continued physical therapy in hospital and recommended venue below to increase strength, balance, endurance for safe ADLs and gait.     Follow Up Recommendations SNF    Equipment Recommendations  None recommended by PT    Recommendations for Other Services       Precautions / Restrictions Precautions Precautions: Fall Restrictions Weight Bearing Restrictions: No      Mobility  Bed Mobility Overal bed mobility: Needs Assistance Bed Mobility: Supine to Sit     Supine to sit: Min guard;Min assist     General bed mobility comments: increased time, labored movement    Transfers Overall transfer level: Needs assistance Equipment used: Rolling walker (2 wheeled) Transfers: Sit to/from [employer] Sit to Stand: Min assist Stand pivot transfers: Min assist       General transfer comment: increased time, labored movement  Ambulation/Gait Ambulation/Gait assistance: Min assist;Mod assist Gait Distance (Feet): 20 Feet Assistive device:  Rolling walker (2 wheeled) Gait Pattern/deviations: Decreased step length - left;Decreased stance time - right;Decreased stride length;Wide base of support Gait velocity: decreased   General Gait Details: slow labored unsteady cadence with wide base of support, limited mostly due to fatigue and generalized weakness  Stairs            Wheelchair Mobility    Modified Rankin (Stroke  Patients Only)       Balance Overall balance assessment: Needs assistance Sitting-balance support: Feet supported;No upper extremity supported Sitting balance-Leahy Scale: Fair Sitting balance - Comments: fair/good seated at EOB   Standing balance support: During functional activity;Bilateral upper extremity supported Standing balance-Leahy Scale: Fair Standing balance comment: using RW                             Pertinent Vitals/Pain Pain Assessment: No/denies pain    Home Living Family/patient expects to be discharged to:: Private residence Living Arrangements: Children Available Help at Discharge: Family;Available PRN/intermittently Type of Home: House Home Access: Stairs to enter Entrance Stairs-Rails: Right;Left;Can reach both Entrance Stairs-Number of Steps: 3 Home Layout: One level Home Equipment: Walker - 2 wheels;Cane - single point;Shower seat;Bedside commode;Wheelchair - manual      Prior Function Level of Independence: Needs assistance   Gait / Transfers Assistance Needed: household ambulator using RW  ADL's / Homemaking Assistance Needed: Independent with basic ADL, daughter assists with groceries        Hand Dominance        Extremity/Trunk Assessment   Upper Extremity Assessment Upper Extremity Assessment: Generalized weakness    Lower Extremity Assessment Lower Extremity Assessment: Generalized weakness    Cervical / Trunk Assessment Cervical / Trunk Assessment: Normal  Communication   Communication: No difficulties  Cognition Arousal/Alertness: Awake/alert Behavior During Therapy: WFL for tasks assessed/performed Overall Cognitive Status: Within Functional Limits for tasks assessed                                        General Comments      Exercises     Assessment/Plan    PT Assessment Patient needs continued PT services  PT Problem List Decreased strength;Decreased activity tolerance;Decreased  balance;Decreased mobility       PT Treatment Interventions DME instruction;Gait training;Stair training;Functional mobility training;Therapeutic activities;Therapeutic exercise;Patient/family education;Balance training    PT Goals (Current goals can be found in the Care Plan section)  Acute Rehab PT Goals Patient Stated Goal: return home with family to assist after rehab PT Goal Formulation: With patient Time For Goal Achievement: 07/14/20 Potential to Achieve Goals: Good    Frequency Min 3X/week   Barriers to discharge        Co-evaluation               AM-PAC PT "6 Clicks" Mobility  Outcome Measure Help needed turning from your back to your side while in a flat bed without using bedrails?: None Help needed moving from lying on your back to sitting on the side of a flat bed without using bedrails?: A Little Help needed moving to and from a bed to a chair (including a wheelchair)?: A Lot Help needed standing up from a chair using your arms (e.g., wheelchair or bedside chair)?: A Little Help needed to walk in hospital room?: A Lot Help needed climbing 3-5 steps with a railing? : A Lot 6  Click Score: 16    End of Session   Activity Tolerance: Patient tolerated treatment well;Patient limited by fatigue Patient left: in chair;with call bell/phone within reach;with nursing/sitter in room;with chair alarm set Nurse Communication: Mobility status PT Visit Diagnosis: Unsteadiness on feet (R26.81);Other abnormalities of gait and mobility (R26.89);Muscle weakness (generalized) (M62.81)    Time: 7116-5790 PT Time Calculation (min) (ACUTE ONLY): 22 min   Charges:   PT Evaluation $PT Eval Moderate Complexity: 1 Mod PT Treatments $Therapeutic Activity: 8-22 mins        1:53 PM, 06/30/20 Ocie Bob, MPT Physical Therapist with [facility] [phone] office [phone] mobile phone

## 2020-07-01 DIAGNOSIS — K668 Other specified disorders of peritoneum: Secondary | ICD-10-CM | POA: Diagnosis not present

## 2020-07-01 LAB — IRON AND TIBC
Iron: 39 ug/dL (ref 28–170)
Saturation Ratios: 21 % (ref 10.4–31.8)
TIBC: 186 ug/dL — ABNORMAL LOW (ref 250–450)
UIBC: 147 ug/dL

## 2020-07-01 LAB — COMPREHENSIVE METABOLIC PANEL
ALT: 35 U/L (ref 0–44)
AST: 38 U/L (ref 15–41)
Albumin: 2.3 g/dL — ABNORMAL LOW (ref 3.5–5.0)
Alkaline Phosphatase: 65 U/L (ref 38–126)
Anion gap: 7 (ref 5–15)
BUN: 34 mg/dL — ABNORMAL HIGH (ref 8–23)
CO2: 21 mmol/L — ABNORMAL LOW (ref 22–32)
Calcium: 8.6 mg/dL — ABNORMAL LOW (ref 8.9–10.3)
Chloride: 114 mmol/L — ABNORMAL HIGH (ref 98–111)
Creatinine, Ser: 1.06 mg/dL — ABNORMAL HIGH (ref 0.44–1.00)
GFR, Estimated: 54 mL/min — ABNORMAL LOW (ref >60)
Glucose, Bld: 67 mg/dL — ABNORMAL LOW (ref 70–99)
Potassium: 4.2 mmol/L (ref 3.5–5.1)
Sodium: 142 mmol/L (ref 135–145)
Total Bilirubin: 0.6 mg/dL (ref 0.3–1.2)
Total Protein: 5.5 g/dL — ABNORMAL LOW (ref 6.5–8.1)

## 2020-07-01 LAB — CBC
HCT: 30.3 % — ABNORMAL LOW (ref 36.0–46.0)
Hemoglobin: 8.7 g/dL — ABNORMAL LOW (ref 12.0–15.0)
MCH: 26.7 pg (ref 26.0–34.0)
MCHC: 28.7 g/dL — ABNORMAL LOW (ref 30.0–36.0)
MCV: 92.9 fL (ref 80.0–100.0)
Platelets: 599 10*3/uL — ABNORMAL HIGH (ref 150–400)
RBC: 3.26 MIL/uL — ABNORMAL LOW (ref 3.87–5.11)
RDW: 20.5 % — ABNORMAL HIGH (ref 11.5–15.5)
WBC: 7.1 10*3/uL (ref 4.0–10.5)
nRBC: 0 % (ref 0.0–0.2)

## 2020-07-01 LAB — FOLATE: Folate: 12.3 ng/mL (ref >5.9)

## 2020-07-01 LAB — MAGNESIUM: Magnesium: 1.9 mg/dL (ref 1.7–2.4)

## 2020-07-01 LAB — RETICULOCYTES
Immature Retic Fract: 24.2 % — ABNORMAL HIGH (ref 2.3–15.9)
RBC.: 3.43 MIL/uL — ABNORMAL LOW (ref 3.87–5.11)
Retic Count, Absolute: 47 10*3/uL (ref 19.0–186.0)
Retic Ct Pct: 1.4 % (ref 0.4–3.1)

## 2020-07-01 LAB — FERRITIN: Ferritin: 172 ng/mL (ref 11–307)

## 2020-07-01 LAB — VITAMIN B12: Vitamin B-12: 293 pg/mL (ref 180–914)

## 2020-07-01 MED ORDER — SODIUM CHLORIDE 0.9 % IV SOLN
2.0000 g | Freq: Two times a day (BID) | INTRAVENOUS | Status: DC
  Administered 2020-07-01 – 2020-07-03 (×6): 2 g via INTRAVENOUS
  Filled 2020-07-01 (×6): qty 2

## 2020-07-01 NOTE — TOC Progression Note (Signed)
Transition of Care [facility] - Progression Note    Patient Details  Name: Brenda Hopkins MRN: [id number] Date of Birth: 1940/03/22  [facility]) CM/SW Contact  Villa Herb, [place] Phone Number: 07/01/2020, 3:47 PM  Clinical Narrative:    CSW spoke with pts daughter Porfirio Mylar to go over bed offers. She would like to accept bed offer at [facility]. CSW updated choice in the HUB. CSW spoke to [place] for auth. [facility] has been approved. Start date for [facility] is 4/20 the review date is 4/22. Reference ID is [id number]. TOC to follow.   Expected Discharge Plan: Skilled Nursing Facility Barriers to Discharge: Continued Medical Work up  Expected Discharge Plan and Services Expected Discharge Plan: Skilled Nursing Facility       Living arrangements for the past 2 months: Single Family Home                                       Social Determinants of Health (SDOH) Interventions    Readmission Risk Interventions Readmission Risk Prevention Plan 06/30/2020  Transportation Screening Complete  HRI or Home Care Consult Complete  SW Recovery Care/Counseling Consult Complete  Palliative Care Screening Not Applicable  Skilled Nursing Facility Complete  Some recent data might be hidden

## 2020-07-01 NOTE — Progress Notes (Signed)
PROGRESS NOTE    Brenda Hopkins  AOZ:[id number] DOB: Jul 11, 1940 DOA: 06/29/2020 PCP: Brenda Dapper, PA-C   Brief Narrative:   [name] a79 y.o.female,with history of hypertension, hyperlipidemia, hiatal hernia, GERD, chronic back pain presents the ED today with a chief complaint of diarrhea, possible UTI, altered mental status, and falls. Patient was recently discharged from the hospital on April 13. At that time she was offered SNF placement but refused.  Patient was admitted with acute encephalopathy with SIRS criteria in the setting of recent C. difficile colitis.  She is now noted to have pneumoperitoneum as well as AKI.  PT recommending SNF placement.  Assessment & Plan:   Active Problems:   AKI (acute kidney injury) (HCC)   Hypoalbuminemia   Acute metabolic encephalopathy   SIRS (systemic inflammatory response syndrome) (HCC)   Hypokalemia   Falls   Pneumoperitoneum   SIRS criteria in the setting of pneumoperitoneum-improving -Recent C. difficile colitis with potentially a microperforation -Consult to general surgery appreciated-recommendations for CLD for now; no surgery -Bowel movements have diminished -Continue on cefepime and Flagyl given the CT finding -Finished oral vancomycin dosing with no further significant diarrhea noted  Acute metabolic encephalopathy secondary to above with recurrent falls -Continue to monitor closely -PT recommending SNF on discharge -Plan to hold Seroquel, gabapentin, and Requip until she is more awake and alert  AKI-improving -Likely prerenal in the setting of GI loss -Continue IV hydration and continue to follow  Hypertension-stable -Continue metoprolol  GERD -IV PPI daily  Acute on chronic anemia-stable -Anemia panel WNL -No overt bleeding -FOBT positive and likely related to recent colitis   DVT prophylaxis: Heparin Code Status: DNR Family Communication: Discussed with daughter on phone  4/20 Disposition Plan:  Status is: Inpatient  Remains inpatient appropriate because:IV treatments appropriate due to intensity of illness or inability to take PO and Inpatient level of care appropriate due to severity of illness   Dispo: The patient is from: Home  Anticipated d/c is to: SNF  Patient currently is not medically stable to d/c.              Difficult to place patient No   Consultants:   General Surgery  Procedures:   See below  Antimicrobials:  Anti-infectives (From admission, onward)   Start     Dose/Rate Route Frequency Ordered Stop   07/01/20 1800  vancomycin (VANCOREADY) IVPB 750 mg/150 mL  Status:  Discontinued        750 mg 150 mL/hr over 60 Minutes Intravenous Every 48 hours 06/29/20 1959 06/30/20 1551   06/30/20 1800  ceFEPIme (MAXIPIME) 2 g in sodium chloride 0.9 % 100 mL IVPB        2 g 200 mL/hr over 30 Minutes Intravenous Every 24 hours 06/29/20 1959     06/30/20 0400  metroNIDAZOLE (FLAGYL) IVPB 500 mg        500 mg 100 mL/hr over 60 Minutes Intravenous Every 8 hours 06/30/20 0112     06/29/20 1745  ceFEPIme (MAXIPIME) 2 g in sodium chloride 0.9 % 100 mL IVPB        2 g 200 mL/hr over 30 Minutes Intravenous  Once 06/29/20 1744 06/29/20 1923   06/29/20 1745  metroNIDAZOLE (FLAGYL) IVPB 500 mg        500 mg 100 mL/hr over 60 Minutes Intravenous  Once 06/29/20 1744 06/29/20 2056   06/29/20 1745  vancomycin (VANCOCIN) IVPB 1000 mg/200 mL premix        1,000  mg 200 mL/hr over 60 Minutes Intravenous  Once 06/29/20 1744 06/29/20 2056       Subjective: Patient seen and evaluated today with continues to remain somnolent, but is arousable.  No significant bowel movements noted overnight.  Objective: Vitals:   06/30/20 0648 06/30/20 1455 06/30/20 2241 07/01/20 0448  BP: 136/69 129/72 (!) 154/91 (!) 114/57  Pulse: 64 (!) 50 69 (!) 56  Resp: 20 18 20 19   Temp: (!) 97.5 F (36.4 C) 97.7 F (36.5 C) 97.7 F (36.5 C)  97.7 F (36.5 C)  TempSrc: Oral Oral Oral   SpO2: 97% 95% 100% 91%  Weight:      Height:        Intake/Output Summary (Last 24 hours) at 07/01/2020 0934 Last data filed at 07/01/2020 0600 Gross per 24 hour  Intake 1278.36 ml  Output --  Net 1278.36 ml   Filed Weights   06/30/20 0001  Weight: 64.2 kg    Examination:  General exam: Appears somnolent Respiratory system: Clear to auscultation. Respiratory effort normal. Cardiovascular system: S1 & S2 heard, RRR.  Gastrointestinal system: Abdomen is soft Central nervous system: somnolent but arousable Extremities: No edema Skin: No significant lesions noted Psychiatry: Flat affect.    Data Reviewed: I have personally reviewed following labs and imaging studies  CBC: Recent Labs  Lab 06/29/20 1722 06/30/20 0622 07/01/20 0621  WBC 14.9* 11.2* 7.1  NEUTROABS 12.5*  --   --   HGB 9.4* 8.8* 8.7*  HCT 31.7* 29.8* 30.3*  MCV 92.2 92.3 92.9  PLT 638* 556* 599*   Basic Metabolic Panel: Recent Labs  Lab 06/29/20 1722 06/30/20 0622 07/01/20 0621  NA 138 139 142  K 3.0* 3.9 4.2  CL 109 110 114*  CO2 21* 21* 21*  GLUCOSE 128* 80 67*  BUN 54* 45* 34*  CREATININE 2.21* 1.61* 1.06*  CALCIUM 8.2* 8.2* 8.6*  MG 1.9 1.5* 1.9   GFR: Estimated Creatinine Clearance: 37.2 mL/min (A) (by C-G formula based on SCr of 1.06 mg/dL (H)). Liver Function Tests: Recent Labs  Lab 06/29/20 1722 06/30/20 0622 07/01/20 0621  AST 89* 61* 38  ALT 39 35 35  ALKPHOS 80 64 65  BILITOT 0.3 0.4 0.6  PROT 5.7* 5.1* 5.5*  ALBUMIN 2.4* 2.2* 2.3*   Recent Labs  Lab 06/29/20 1722  LIPASE 23   Recent Labs  Lab 06/29/20 1722  AMMONIA 18   Coagulation Profile: Recent Labs  Lab 06/29/20 1722  INR 1.2   Cardiac Enzymes: No results for input(s): CKTOTAL, CKMB, CKMBINDEX, TROPONINI in the last 168 hours. BNP (last 3 results) No results for input(s): PROBNP in the last 8760 hours. HbA1C: No results for input(s): HGBA1C in the last  72 hours. CBG: No results for input(s): GLUCAP in the last 168 hours. Lipid Profile: No results for input(s): CHOL, HDL, LDLCALC, TRIG, CHOLHDL, LDLDIRECT in the last 72 hours. Thyroid Function Tests: No results for input(s): TSH, T4TOTAL, FREET4, T3FREE, THYROIDAB in the last 72 hours. Anemia Panel: Recent Labs    07/01/20 0621  VITAMINB12 293  FOLATE 12.3  FERRITIN 172  TIBC 186*  IRON 39  RETICCTPCT 1.4   Sepsis Labs: Recent Labs  Lab 06/29/20 1845 06/29/20 2010  LATICACIDVEN 1.4 1.9    Recent Results (from the past 240 hour(s))  C. Diff by PCR     Status: Abnormal   Collection Time: 06/21/20  8:52 PM   Specimen: STOOL  Result Value Ref Range Status   Toxigenic  C. Difficile by PCR POSITIVE (A) NEGATIVE Final    Comment: Positive for toxigenic C. difficile with little to no toxin production. Only treat if clinical presentation suggests symptomatic illness. Performed at [facility], [address], [place], [place] [address]   Blood culture (routine x 2)     Status: None (Preliminary result)   Collection Time: 06/29/20  6:17 PM   Specimen: BLOOD  Result Value Ref Range Status   Specimen Description BLOOD RIGHT ANTECUBITAL  Final   Special Requests   Final    BOTTLES DRAWN AEROBIC AND ANAEROBIC Blood Culture adequate volume   Culture   Final    NO GROWTH 2 DAYS Performed at [facility], [address], [place], [place] [address]    Report Status PENDING  Incomplete  Resp Panel by RT-PCR (Flu A&B, Covid) Nasopharyngeal Swab     Status: Abnormal   Collection Time: 06/29/20  6:20 PM   Specimen: Nasopharyngeal Swab; Nasopharyngeal(NP) swabs in vial transport medium  Result Value Ref Range Status   SARS Coronavirus 2 by RT PCR NEGATIVE NEGATIVE Final   Influenza A by PCR RESULTS UNAVAILABLE DUE TO INTERFERING SUBSTANCE (A) NEGATIVE Final   Influenza B by PCR RESULTS UNAVAILABLE DUE TO INTERFERING SUBSTANCE (A) NEGATIVE Final    Comment: (NOTE) The Xpert Xpress  SARS-CoV-2/FLU/RSV plus assay is intended as an aid in the diagnosis of influenza from Nasopharyngeal swab specimens and should not be used as a sole basis for treatment. Nasal washings and aspirates are unacceptable for Xpert Xpress SARS-CoV-2/FLU/RSV testing.  Fact Sheet for Patients: BloggerCourse.com  Fact Sheet for Healthcare Providers: SeriousBroker.it  This test is not yet approved or cleared by the [place] FDA and has been authorized for detection and/or diagnosis of SARS-CoV-2 by FDA under an Emergency Use Authorization (EUA). This EUA will remain in effect (meaning this test can be used) for the duration of the COVID-19 declaration under Section 564(b)(1) of the Act, 21 U.S.C. section 360bbb-3(b)(1), unless the authorization is terminated or revoked.  Performed at [facility], [address] [place], [place] [address]   Blood culture (routine x 2)     Status: None (Preliminary result)   Collection Time: 06/29/20  6:39 PM   Specimen: BLOOD LEFT WRIST  Result Value Ref Range Status   Specimen Description BLOOD LEFT WRIST  Final   Special Requests   Final    BOTTLES DRAWN AEROBIC AND ANAEROBIC Blood Culture results may not be optimal due to an inadequate volume of blood received in culture bottles   Culture   Final    NO GROWTH 2 DAYS Performed at [facility], [address] [place], [place] [address]    Report Status PENDING  Incomplete  Urine Culture     Status: Abnormal (Preliminary result)   Collection Time: 06/30/20  1:29 AM   Specimen: Urine, Catheterized  Result Value Ref Range Status   Specimen Description   Final    URINE, CATHETERIZED Performed at [facility], [address], [place], [place] [address]    Special Requests   Final    NONE Performed at [facility], [address], [place], [place] [address]    Culture (A)  Final    10,000 COLONIES/mL GRAM NEGATIVE RODS SUSCEPTIBILITIES TO  FOLLOW Performed at [facility], [address] [place], [place] [address]    Report Status PENDING  Incomplete         Radiology Studies: CT ABDOMEN PELVIS WO CONTRAST  Result Date: 06/30/2020 CLINICAL DATA:  80 year old female with history of hematochezia. Suspected acute diverticulitis. EXAM: CT ABDOMEN AND PELVIS WITHOUT CONTRAST TECHNIQUE: Multidetector CT imaging of the abdomen and pelvis was performed following the standard protocol without IV contrast. COMPARISON:  CT the abdomen and pelvis 06/19/2020. FINDINGS: Lower chest: Atherosclerotic calcifications in the descending thoracic aorta as well as the left main, left anterior descending, left circumflex and right coronary arteries. Calcifications of the mitral annulus and mitral valve. Hepatobiliary: No definite suspicious cystic or solid hepatic lesions are confidently identified on today's noncontrast CT examination. Unenhanced appearance of the gallbladder is normal. Pancreas: No definite pancreatic mass or peripancreatic fluid collections or inflammatory changes. Spleen: Unremarkable. Adrenals/Urinary Tract: Calcification in the left renal hilum appears to be vascular. No definite suspicious renal lesions. Unenhanced appearance of the kidneys and bilateral adrenal glands is normal. No hydroureteronephrosis. Urinary bladder is normal in appearance. Stomach/Bowel: Postoperative changes of Roux-en-Y gastric bypass. No pathologic dilatation of small bowel or colon. Numerous colonic diverticulae are noted, without surrounding inflammatory changes to clearly indicate an acute diverticulitis at this time. The appendix is not confidently identified and may be surgically absent. Regardless, there are no inflammatory changes noted adjacent to the cecum to suggest the presence of an acute appendicitis at this time. Vascular/Lymphatic: Aortic atherosclerosis. No lymphadenopathy noted in the abdomen or pelvis. Reproductive: Status post  hysterectomy. Ovaries are not confidently identified may be surgically absent or atrophic. Other: Small volume of pneumoperitoneum evident beneath both of the diaphragms, and within the patient's ventral hernia which otherwise contains predominantly fat. No significant volume of ascites. Musculoskeletal: Old healed fractures of the right superior and inferior pubic rami with posttraumatic deformity. Status post PLIF at L5-S1 with 1.3 cm of anterolisthesis of L5 upon S1. Chronic appearing compression fracture of T9 with complete loss of anterior vertebral body height. IMPRESSION: 1. Today's study is positive for pneumoperitoneum. The source of pneumoperitoneum is not readily apparent on today's examination, but the presence of pneumoperitoneum raises concern for potential bowel perforation. 2. Colonic diverticulosis without definitive findings to suggest an acute diverticulitis at this time. 3. Aortic atherosclerosis, in addition to left main and 3 vessel coronary artery disease. Assessment for potential risk factor modification, dietary therapy or pharmacologic therapy may be warranted, if clinically indicated. 4. There are calcifications of the mitral annulus/valve. Echocardiographic correlation for evaluation of potential valvular dysfunction may be warranted if clinically indicated. 5. Additional postoperative changes and incidental finding, as above. Critical Value/emergent results were called by telephone at the time of interpretation on 06/30/2020 at 2:43 pm to provider Dr. Maurilio LovelyPratik Braydan Marriott, who verbally acknowledged these results. Electronically Signed   By: Trudie Reedaniel  Entrikin M.D.   On: 06/30/2020 14:43   CT Head Wo Contrast  Result Date: 06/29/2020 CLINICAL DATA:  Fall altered EXAM: CT HEAD WITHOUT CONTRAST TECHNIQUE: Contiguous axial images were obtained from the base of the skull through the vertex without intravenous contrast. COMPARISON:  MRI 06/19/2020, CT brain 06/18/2020 FINDINGS: Brain: No acute  territorial infarction, hemorrhage or intracranial mass. Mild atrophy. Moderate white matter hypodensity consistent with chronic small vessel ischemic change. Chronic lacunar infarcts within the bilateral basal ganglia. Stable ventricle size Vascular: No hyperdense vessels.  Carotid vascular calcification Skull: Normal. Negative for fracture or focal lesion. Sinuses/Orbits: No acute finding. Other: None IMPRESSION: 1. No CT evidence for acute intracranial abnormality. 2. Atrophy and chronic small vessel ischemic changes of the white matter. Electronically Signed   By: Jasmine PangKim  Fujinaga M.D.   On:  06/29/2020 20:38   DG Chest Port 1 View  Result Date: 06/29/2020 CLINICAL DATA:  Code sepsis altered week EXAM: PORTABLE CHEST 1 VIEW COMPARISON:  06/19/2020 FINDINGS: Low lung volumes. Atelectasis or scarring in the left lower lung. No focal consolidation or effusion. Stable enlarged cardiomediastinal silhouette with aortic atherosclerosis. No pneumothorax. Chronic bilateral shoulder deformities. IMPRESSION: Low lung volumes with atelectasis or scarring in the left lower lung. Cardiomegaly Electronically Signed   By: Jasmine Pang M.D.   On: 06/29/2020 18:17        Scheduled Meds: . atorvastatin  20 mg Oral QHS  . gabapentin  600 mg Oral BID  . heparin  5,000 Units Subcutaneous Q8H  . mouth rinse  15 mL Mouth Rinse BID  . metoprolol tartrate  25 mg Oral BID  . pantoprazole  40 mg Intravenous Daily  . QUEtiapine  200 mg Oral QHS  . rOPINIRole  3 mg Oral QHS   Continuous Infusions: . ceFEPime (MAXIPIME) IV Stopped (06/30/20 1747)  . lactated ringers 75 mL/hr at 07/01/20 0844  . metronidazole 500 mg (07/01/20 0401)     LOS: 2 days    Time spent: 35 minutes    Airen Stiehl D Sherryll Burger, DO [facility]  If 7PM-7AM, please contact night-coverage [email or web address] 07/01/2020, 9:34 AM

## 2020-07-01 NOTE — Progress Notes (Signed)
Subjective: I could not elicit pain on abdominal examination.  Patient is somnolent.  Objective: Vital signs in last 24 hours: Temp:  [97.7 F (36.5 C)] 97.7 F (36.5 C) (04/20 0448) Pulse Rate:  [50-69] 56 (04/20 0448) Resp:  [18-20] 19 (04/20 0448) BP: (114-154)/(57-91) 114/57 (04/20 0448) SpO2:  [91 %-100 %] 91 % (04/20 0448) Last BM Date: 06/29/20  Intake/Output from previous day: 04/19 0701 - 04/20 0700 In: 1398.4 [P.O.:240; I.V.:748.6; IV Piggyback:409.8] Out: -  Intake/Output this shift: No intake/output data recorded.  General appearance: no distress GI: soft, non-tender; bowel sounds normal; no masses,  no organomegaly  Lab Results:  Recent Labs    06/30/20 0622 07/01/20 0621  WBC 11.2* 7.1  HGB 8.8* 8.7*  HCT 29.8* 30.3*  PLT 556* 599*   BMET Recent Labs    06/30/20 0622 07/01/20 0621  NA 139 142  K 3.9 4.2  CL 110 114*  CO2 21* 21*  GLUCOSE 80 67*  BUN 45* 34*  CREATININE 1.61* 1.06*  CALCIUM 8.2* 8.6*   PT/INR Recent Labs    06/29/20 1722  LABPROT 15.2  INR 1.2    Studies/Results: CT ABDOMEN PELVIS WO CONTRAST  Result Date: 06/30/2020 CLINICAL DATA:  80 year old female with history of hematochezia. Suspected acute diverticulitis. EXAM: CT ABDOMEN AND PELVIS WITHOUT CONTRAST TECHNIQUE: Multidetector CT imaging of the abdomen and pelvis was performed following the standard protocol without IV contrast. COMPARISON:  CT the abdomen and pelvis 06/19/2020. FINDINGS: Lower chest: Atherosclerotic calcifications in the descending thoracic aorta as well as the left main, left anterior descending, left circumflex and right coronary arteries. Calcifications of the mitral annulus and mitral valve. Hepatobiliary: No definite suspicious cystic or solid hepatic lesions are confidently identified on today's noncontrast CT examination. Unenhanced appearance of the gallbladder is normal. Pancreas: No definite pancreatic mass or peripancreatic fluid collections  or inflammatory changes. Spleen: Unremarkable. Adrenals/Urinary Tract: Calcification in the left renal hilum appears to be vascular. No definite suspicious renal lesions. Unenhanced appearance of the kidneys and bilateral adrenal glands is normal. No hydroureteronephrosis. Urinary bladder is normal in appearance. Stomach/Bowel: Postoperative changes of Roux-en-Y gastric bypass. No pathologic dilatation of small bowel or colon. Numerous colonic diverticulae are noted, without surrounding inflammatory changes to clearly indicate an acute diverticulitis at this time. The appendix is not confidently identified and may be surgically absent. Regardless, there are no inflammatory changes noted adjacent to the cecum to suggest the presence of an acute appendicitis at this time. Vascular/Lymphatic: Aortic atherosclerosis. No lymphadenopathy noted in the abdomen or pelvis. Reproductive: Status post hysterectomy. Ovaries are not confidently identified may be surgically absent or atrophic. Other: Small volume of pneumoperitoneum evident beneath both of the diaphragms, and within the patient's ventral hernia which otherwise contains predominantly fat. No significant volume of ascites. Musculoskeletal: Old healed fractures of the right superior and inferior pubic rami with posttraumatic deformity. Status post PLIF at L5-S1 with 1.3 cm of anterolisthesis of L5 upon S1. Chronic appearing compression fracture of T9 with complete loss of anterior vertebral body height. IMPRESSION: 1. Today's study is positive for pneumoperitoneum. The source of pneumoperitoneum is not readily apparent on today's examination, but the presence of pneumoperitoneum raises concern for potential bowel perforation. 2. Colonic diverticulosis without definitive findings to suggest an acute diverticulitis at this time. 3. Aortic atherosclerosis, in addition to left main and 3 vessel coronary artery disease. Assessment for potential risk factor modification,  dietary therapy or pharmacologic therapy may be warranted, if clinically indicated. 4.  There are calcifications of the mitral annulus/valve. Echocardiographic correlation for evaluation of potential valvular dysfunction may be warranted if clinically indicated. 5. Additional postoperative changes and incidental finding, as above. Critical Value/emergent results were called by telephone at the time of interpretation on 06/30/2020 at 2:43 pm to provider Dr. Maurilio Lovely, who verbally acknowledged these results. Electronically Signed   By: Trudie Reed M.D.   On: 06/30/2020 14:43   CT Head Wo Contrast  Result Date: 06/29/2020 CLINICAL DATA:  Fall altered EXAM: CT HEAD WITHOUT CONTRAST TECHNIQUE: Contiguous axial images were obtained from the base of the skull through the vertex without intravenous contrast. COMPARISON:  MRI 06/19/2020, CT brain 06/18/2020 FINDINGS: Brain: No acute territorial infarction, hemorrhage or intracranial mass. Mild atrophy. Moderate white matter hypodensity consistent with chronic small vessel ischemic change. Chronic lacunar infarcts within the bilateral basal ganglia. Stable ventricle size Vascular: No hyperdense vessels.  Carotid vascular calcification Skull: Normal. Negative for fracture or focal lesion. Sinuses/Orbits: No acute finding. Other: None IMPRESSION: 1. No CT evidence for acute intracranial abnormality. 2. Atrophy and chronic small vessel ischemic changes of the white matter. Electronically Signed   By: Jasmine Pang M.D.   On: 06/29/2020 20:38   DG Chest Port 1 View  Result Date: 06/29/2020 CLINICAL DATA:  Code sepsis altered week EXAM: PORTABLE CHEST 1 VIEW COMPARISON:  06/19/2020 FINDINGS: Low lung volumes. Atelectasis or scarring in the left lower lung. No focal consolidation or effusion. Stable enlarged cardiomediastinal silhouette with aortic atherosclerosis. No pneumothorax. Chronic bilateral shoulder deformities. IMPRESSION: Low lung volumes with atelectasis  or scarring in the left lower lung. Cardiomegaly Electronically Signed   By: Jasmine Pang M.D.   On: 06/29/2020 18:17    Anti-infectives: Anti-infectives (From admission, onward)   Start     Dose/Rate Route Frequency Ordered Stop   07/01/20 1800  vancomycin (VANCOREADY) IVPB 750 mg/150 mL  Status:  Discontinued        750 mg 150 mL/hr over 60 Minutes Intravenous Every 48 hours 06/29/20 1959 06/30/20 1551   06/30/20 1800  ceFEPIme (MAXIPIME) 2 g in sodium chloride 0.9 % 100 mL IVPB        2 g 200 mL/hr over 30 Minutes Intravenous Every 24 hours 06/29/20 1959     06/30/20 0400  metroNIDAZOLE (FLAGYL) IVPB 500 mg        500 mg 100 mL/hr over 60 Minutes Intravenous Every 8 hours 06/30/20 0112     06/29/20 1745  ceFEPIme (MAXIPIME) 2 g in sodium chloride 0.9 % 100 mL IVPB        2 g 200 mL/hr over 30 Minutes Intravenous  Once 06/29/20 1744 06/29/20 1923   06/29/20 1745  metroNIDAZOLE (FLAGYL) IVPB 500 mg        500 mg 100 mL/hr over 60 Minutes Intravenous  Once 06/29/20 1744 06/29/20 2056   06/29/20 1745  vancomycin (VANCOCIN) IVPB 1000 mg/200 mL premix        1,000 mg 200 mL/hr over 60 Minutes Intravenous  Once 06/29/20 1744 06/29/20 2056      Assessment/Plan: Impression: Pneumoperitoneum of unknown etiology.  No obvious progression noted.  Doubt bowel perforation.  This is probably secondary to her C. difficile colitis with translocation of air.  No need for acute surgical intervention.  May advance diet as tolerated.  White blood cell count reassuring.  Discussed with Dr. Sherryll Burger.  LOS: 2 days    Franky Macho 07/01/2020

## 2020-07-01 NOTE — Progress Notes (Addendum)
Pharmacy Antibiotic Note  Brenda Hopkins is a 80 y.o. female admitted on 06/29/2020 with sepsis.  Pharmacy has been consulted for cefepime dosing. Pneumoperitoneum of unknown etiology. BMs diminished. WBC improved. AKI resolving.  Plan: Increase cefepime 2gm iv q12h  F/Ucxs and clinical progress Monitor V/S, labs  Height: 5' (152.4 cm) Weight: 64.2 kg (141 lb 8 oz) IBW/kg (Calculated) : 45.5  Temp (24hrs), Avg:97.7 F (36.5 C), Min:97.7 F (36.5 C), Max:97.7 F (36.5 C)  Recent Labs  Lab 06/29/20 1722 06/29/20 1845 06/29/20 2010 06/30/20 0622 07/01/20 0621  WBC 14.9*  --   --  11.2* 7.1  CREATININE 2.21*  --   --  1.61* 1.06*  LATICACIDVEN  --  1.4 1.9  --   --     Estimated Creatinine Clearance: 37.2 mL/min (A) (by C-G formula based on SCr of 1.06 mg/dL (H)).    Allergies  Allergen Reactions  . Prozac [Fluoxetine Hcl] Other (See Comments)    Makes her fell crazy  . Zoloft [Sertraline Hcl]     Made her feel crazy--10/2013  . Nicoderm [Nicotine] Rash    Site rash    Antimicrobials this admission: 4/18 cefepime >> 4/19 Metronidazole >> 4/18 vancomycin >> 4/19  Microbiology results: 4/18 BCx: ngtd 4/19 UCX; 10K CFU/ml GNR Thank you for allowing pharmacy to be a part of this patient's care.  Elder Cyphers, BS Pharm D, [place] Clinical Pharmacist Pager [phone] 07/01/2020 11:08 AM

## 2020-07-02 ENCOUNTER — Inpatient Hospital Stay (HOSPITAL_COMMUNITY): Payer: Medicare Other

## 2020-07-02 ENCOUNTER — Encounter (HOSPITAL_COMMUNITY): Payer: Self-pay | Admitting: Family Medicine

## 2020-07-02 LAB — CBC
HCT: 35.9 % — ABNORMAL LOW (ref 36.0–46.0)
Hemoglobin: 10.6 g/dL — ABNORMAL LOW (ref 12.0–15.0)
MCH: 27 pg (ref 26.0–34.0)
MCHC: 29.5 g/dL — ABNORMAL LOW (ref 30.0–36.0)
MCV: 91.3 fL (ref 80.0–100.0)
Platelets: 635 10*3/uL — ABNORMAL HIGH (ref 150–400)
RBC: 3.93 MIL/uL (ref 3.87–5.11)
RDW: 20.7 % — ABNORMAL HIGH (ref 11.5–15.5)
WBC: 10.7 10*3/uL — ABNORMAL HIGH (ref 4.0–10.5)
nRBC: 0 % (ref 0.0–0.2)

## 2020-07-02 LAB — COMPREHENSIVE METABOLIC PANEL
ALT: 34 U/L (ref 0–44)
AST: 30 U/L (ref 15–41)
Albumin: 2.7 g/dL — ABNORMAL LOW (ref 3.5–5.0)
Alkaline Phosphatase: 79 U/L (ref 38–126)
Anion gap: 7 (ref 5–15)
BUN: 20 mg/dL (ref 8–23)
CO2: 22 mmol/L (ref 22–32)
Calcium: 8.8 mg/dL — ABNORMAL LOW (ref 8.9–10.3)
Chloride: 113 mmol/L — ABNORMAL HIGH (ref 98–111)
Creatinine, Ser: 0.79 mg/dL (ref 0.44–1.00)
GFR, Estimated: >60 mL/min (ref >60)
Glucose, Bld: 79 mg/dL (ref 70–99)
Potassium: 3.9 mmol/L (ref 3.5–5.1)
Sodium: 142 mmol/L (ref 135–145)
Total Bilirubin: 0.6 mg/dL (ref 0.3–1.2)
Total Protein: 6.3 g/dL — ABNORMAL LOW (ref 6.5–8.1)

## 2020-07-02 LAB — TROPONIN I (HIGH SENSITIVITY)
Troponin I (High Sensitivity): 21 ng/L — ABNORMAL HIGH (ref <18)
Troponin I (High Sensitivity): 16 ng/L (ref <18)

## 2020-07-02 LAB — MAGNESIUM: Magnesium: 1.6 mg/dL — ABNORMAL LOW (ref 1.7–2.4)

## 2020-07-02 LAB — URINE CULTURE: Culture: 10000 — AB

## 2020-07-02 MED ORDER — MAGNESIUM SULFATE 2 GM/50ML IV SOLN
2.0000 g | Freq: Once | INTRAVENOUS | Status: AC
  Administered 2020-07-02: 2 g via INTRAVENOUS
  Filled 2020-07-02: qty 50

## 2020-07-02 MED ORDER — LABETALOL HCL 5 MG/ML IV SOLN: 10.0000 mg | INTRAVENOUS | Status: DC | PRN

## 2020-07-02 MED ORDER — SODIUM CHLORIDE 0.9% FLUSH
10.0000 mL | Freq: Two times a day (BID) | INTRAVENOUS | Status: DC
Start: 2020-07-02 — End: 2020-07-06
  Administered 2020-07-02 – 2020-07-04 (×3): 10 mL

## 2020-07-02 MED ORDER — SODIUM CHLORIDE 0.9% FLUSH: 10.0000 mL | INTRAVENOUS | Status: DC | PRN

## 2020-07-02 MED ORDER — MORPHINE SULFATE (PF) 2 MG/ML IV SOLN
1.0000 mg | INTRAVENOUS | Status: DC | PRN
  Administered 2020-07-02 – 2020-07-04 (×6): 1 mg via INTRAVENOUS
  Filled 2020-07-02 (×7): qty 1

## 2020-07-02 NOTE — Progress Notes (Signed)
PROGRESS NOTE    Ivar DrapeBrenda Gail Hopkins  ZOX:[id number] DOB: 08/19/1940 DOA: 06/29/2020 PCP: Shawnie DapperMann, Benjamin L, PA-C   Brief Narrative:   [name] a79 y.o.female,with history of hypertension, hyperlipidemia, hiatal hernia, GERD, chronic back pain presents the ED today with a chief complaint of diarrhea, possible UTI, altered mental status, and falls. Patient was recently discharged from the hospital on April 13. At that time she was offered SNF placement but refused.Patient was admitted with acute encephalopathy with SIRS criteria in the setting of recent C. difficile colitis. She is now noted to have pneumoperitoneum as well as AKI. PT recommending SNF placement.   Assessment & Plan:   Active Problems:   AKI (acute kidney injury) (HCC)   Hypoalbuminemia   Acute metabolic encephalopathy   SIRS (systemic inflammatory response syndrome) (HCC)   Hypokalemia   Falls   Pneumoperitoneum   Sepsis on admission secondary to Klebsiella pneumonia UTI -Continue on cefepime as this appears to be multidrug-resistant  Acute metabolic encephalopathy secondary to above with recurrent falls -Continue to monitor closely -PT recommending SNF on discharge -Plan to hold Seroquel, gabapentin, and Requip until she is more awake and alert  Pneumoperitoneum -Likely related to recent C. difficile colitis with associated microperforation -Currently has some nausea and vomiting, continue dietary advancement slowly -Bowel movements have diminished -Continue cefepime and Flagyl -Appreciate general surgery evaluation with recommendation for intervention at this time -KUB unremarkable 4/21  AKI- resolved -Likely prerenal in the setting of GI loss -Continue IV hydration and continue to follow  Hypomagnesemia -Replete and recheck in am  Hypertension-elevated -Continue metoprolol  GERD -IV PPI daily  Acute on chronic anemia-stable -Anemia panel WNL -No overt bleeding -FOBT  positive and likely related to recent colitis   DVT prophylaxis:Heparin Code Status:DNR Family Communication:Discussed with daughter on phone 4/21 Disposition Plan: Status is: Inpatient  Remains inpatient appropriate because:IV treatments appropriate due to intensity of illness or inability to take PO and Inpatient level of care appropriate due to severity of illness   Dispo: The patient is from:Home Anticipated d/c is to:SNF-[facility] Patient currently is not medically stable to d/c. Difficult to place patient No   Consultants:  General Surgery  Procedures:  See below  Antimicrobials:  Anti-infectives (From admission, onward)   Start     Dose/Rate Route Frequency Ordered Stop   07/01/20 1800  vancomycin (VANCOREADY) IVPB 750 mg/150 mL  Status:  Discontinued        750 mg 150 mL/hr over 60 Minutes Intravenous Every 48 hours 06/29/20 1959 06/30/20 1551   07/01/20 1200  ceFEPIme (MAXIPIME) 2 g in sodium chloride 0.9 % 100 mL IVPB        2 g 200 mL/hr over 30 Minutes Intravenous Every 12 hours 07/01/20 1112     06/30/20 1800  ceFEPIme (MAXIPIME) 2 g in sodium chloride 0.9 % 100 mL IVPB  Status:  Discontinued        2 g 200 mL/hr over 30 Minutes Intravenous Every 24 hours 06/29/20 1959 07/01/20 1112   06/30/20 0400  metroNIDAZOLE (FLAGYL) IVPB 500 mg        500 mg 100 mL/hr over 60 Minutes Intravenous Every 8 hours 06/30/20 0112     06/29/20 1745  ceFEPIme (MAXIPIME) 2 g in sodium chloride 0.9 % 100 mL IVPB        2 g 200 mL/hr over 30 Minutes Intravenous  Once 06/29/20 1744 06/29/20 1923   06/29/20 1745  metroNIDAZOLE (FLAGYL) IVPB 500 mg  500 mg 100 mL/hr over 60 Minutes Intravenous  Once 06/29/20 1744 06/29/20 2056   06/29/20 1745  vancomycin (VANCOCIN) IVPB 1000 mg/200 mL premix        1,000 mg 200 mL/hr over 60 Minutes Intravenous  Once 06/29/20 1744 06/29/20 2056       Subjective: Patient seen and  evaluated today with some chest pain and vomiting noted this am. She has remained confused overnight and is more alert this am.  Objective: Vitals:   07/01/20 2135 07/01/20 2138 07/02/20 0758 07/02/20 1319  BP:  (!) 136/114 (!) 124/100 138/73  Pulse:  66 74 81  Resp: 14 14 16 16   Temp:  98.7 F (37.1 C) 98.7 F (37.1 C) 98.2 F (36.8 C)  TempSrc:  Oral Oral Oral  SpO2:  92% 97% 94%  Weight:      Height:        Intake/Output Summary (Last 24 hours) at 07/02/2020 1435 Last data filed at 07/02/2020 0900 Gross per 24 hour  Intake 2204.63 ml  Output --  Net 2204.63 ml   Filed Weights   06/30/20 0001  Weight: 64.2 kg    Examination:  General exam: Appears moderately distressed/confused Respiratory system: Clear to auscultation. Respiratory effort normal. Cardiovascular system: S1 & S2 heard, RRR.  Gastrointestinal system: Abdomen is soft Central nervous system: Alert and awake Extremities: No edema Skin: No significant lesions noted Psychiatry: Flat affect.    Data Reviewed: I have personally reviewed following labs and imaging studies  CBC: Recent Labs  Lab 06/29/20 1722 06/30/20 0622 07/01/20 0621 07/02/20 0633  WBC 14.9* 11.2* 7.1 10.7*  NEUTROABS 12.5*  --   --   --   HGB 9.4* 8.8* 8.7* 10.6*  HCT 31.7* 29.8* 30.3* 35.9*  MCV 92.2 92.3 92.9 91.3  PLT 638* 556* 599* 635*   Basic Metabolic Panel: Recent Labs  Lab 06/29/20 1722 06/30/20 0622 07/01/20 0621 07/02/20 0633  NA 138 139 142 142  K 3.0* 3.9 4.2 3.9  CL 109 110 114* 113*  CO2 21* 21* 21* 22  GLUCOSE 128* 80 67* 79  BUN 54* 45* 34* 20  CREATININE 2.21* 1.61* 1.06* 0.79  CALCIUM 8.2* 8.2* 8.6* 8.8*  MG 1.9 1.5* 1.9 1.6*   GFR: Estimated Creatinine Clearance: 49.3 mL/min (by C-G formula based on SCr of 0.79 mg/dL). Liver Function Tests: Recent Labs  Lab 06/29/20 1722 06/30/20 0622 07/01/20 0621 07/02/20 0633  AST 89* 61* 38 30  ALT 39 35 35 34  ALKPHOS 80 64 65 79  BILITOT 0.3 0.4  0.6 0.6  PROT 5.7* 5.1* 5.5* 6.3*  ALBUMIN 2.4* 2.2* 2.3* 2.7*   Recent Labs  Lab 06/29/20 1722  LIPASE 23   Recent Labs  Lab 06/29/20 1722  AMMONIA 18   Coagulation Profile: Recent Labs  Lab 06/29/20 1722  INR 1.2   Cardiac Enzymes: No results for input(s): CKTOTAL, CKMB, CKMBINDEX, TROPONINI in the last 168 hours. BNP (last 3 results) No results for input(s): PROBNP in the last 8760 hours. HbA1C: No results for input(s): HGBA1C in the last 72 hours. CBG: No results for input(s): GLUCAP in the last 168 hours. Lipid Profile: No results for input(s): CHOL, HDL, LDLCALC, TRIG, CHOLHDL, LDLDIRECT in the last 72 hours. Thyroid Function Tests: No results for input(s): TSH, T4TOTAL, FREET4, T3FREE, THYROIDAB in the last 72 hours. Anemia Panel: Recent Labs    07/01/20 0621  VITAMINB12 293  FOLATE 12.3  FERRITIN 172  TIBC 186*  IRON 39  RETICCTPCT 1.4   Sepsis Labs: Recent Labs  Lab 06/29/20 1845 06/29/20 2010  LATICACIDVEN 1.4 1.9    Recent Results (from the past 240 hour(s))  Blood culture (routine x 2)     Status: None (Preliminary result)   Collection Time: 06/29/20  6:17 PM   Specimen: BLOOD  Result Value Ref Range Status   Specimen Description BLOOD RIGHT ANTECUBITAL  Final   Special Requests   Final    BOTTLES DRAWN AEROBIC AND ANAEROBIC Blood Culture adequate volume   Culture   Final    NO GROWTH 3 DAYS Performed at [facility], [address] [place], [place] [address]    Report Status PENDING  Incomplete  Resp Panel by RT-PCR (Flu A&B, Covid) Nasopharyngeal Swab     Status: Abnormal   Collection Time: 06/29/20  6:20 PM   Specimen: Nasopharyngeal Swab; Nasopharyngeal(NP) swabs in vial transport medium  Result Value Ref Range Status   SARS Coronavirus 2 by RT PCR NEGATIVE NEGATIVE Final   Influenza A by PCR RESULTS UNAVAILABLE DUE TO INTERFERING SUBSTANCE (A) NEGATIVE Final   Influenza B by PCR RESULTS UNAVAILABLE DUE TO INTERFERING SUBSTANCE (A)  NEGATIVE Final    Comment: (NOTE) The Xpert Xpress SARS-CoV-2/FLU/RSV plus assay is intended as an aid in the diagnosis of influenza from Nasopharyngeal swab specimens and should not be used as a sole basis for treatment. Nasal washings and aspirates are unacceptable for Xpert Xpress SARS-CoV-2/FLU/RSV testing.  Fact Sheet for Patients: BloggerCourse.com  Fact Sheet for Healthcare Providers: SeriousBroker.it  This test is not yet approved or cleared by the [place] FDA and has been authorized for detection and/or diagnosis of SARS-CoV-2 by FDA under an Emergency Use Authorization (EUA). This EUA will remain in effect (meaning this test can be used) for the duration of the COVID-19 declaration under Section 564(b)(1) of the Act, 21 U.S.C. section 360bbb-3(b)(1), unless the authorization is terminated or revoked.  Performed at [facility], [address] [place], [place] [address]   Blood culture (routine x 2)     Status: None (Preliminary result)   Collection Time: 06/29/20  6:39 PM   Specimen: BLOOD LEFT WRIST  Result Value Ref Range Status   Specimen Description BLOOD LEFT WRIST  Final   Special Requests   Final    BOTTLES DRAWN AEROBIC AND ANAEROBIC Blood Culture results may not be optimal due to an inadequate volume of blood received in culture bottles   Culture   Final    NO GROWTH 3 DAYS Performed at [facility], [address], [place], [place] [address]    Report Status PENDING  Incomplete  Urine Culture     Status: Abnormal   Collection Time: 06/30/20  1:29 AM   Specimen: Urine, Catheterized  Result Value Ref Range Status   Specimen Description   Final    URINE, CATHETERIZED Performed at [facility], [address], [place], [place] [address]    Special Requests   Final    NONE Performed at [facility], [address], [place], [place] [address]    Culture (A)  Final    10,000 COLONIES/mL KLEBSIELLA  PNEUMONIAE Confirmed Extended Spectrum Beta-Lactamase Producer (ESBL).  In bloodstream infections from ESBL organisms, carbapenems are preferred over piperacillin/tazobactam. They are shown to have a lower risk of mortality.    Report Status 07/02/2020 FINAL  Final   Organism ID, Bacteria KLEBSIELLA PNEUMONIAE (A)  Final      Susceptibility   Klebsiella pneumoniae - MIC*  AMPICILLIN >=32 RESISTANT Resistant     CEFAZOLIN >=64 RESISTANT Resistant     CEFEPIME 1 SENSITIVE Sensitive     CEFTRIAXONE 16 RESISTANT Resistant     CIPROFLOXACIN >=4 RESISTANT Resistant     GENTAMICIN 8 INTERMEDIATE Intermediate     IMIPENEM <=0.25 SENSITIVE Sensitive     NITROFURANTOIN 128 RESISTANT Resistant     TRIMETH/SULFA <=20 SENSITIVE Sensitive     AMPICILLIN/SULBACTAM >=32 RESISTANT Resistant     PIP/TAZO 16 SENSITIVE Sensitive     * 10,000 COLONIES/mL KLEBSIELLA PNEUMONIAE         Radiology Studies: DG Chest 1 View  Result Date: 07/02/2020 CLINICAL DATA:  Chest pain. EXAM: CHEST  1 VIEW COMPARISON:  June 29, 2020. FINDINGS: Stable cardiomediastinal silhouette. No pneumothorax or pleural effusion is noted. Minimal left midlung subsegmental atelectasis or scarring is noted. Old bilateral rib fractures are noted. IMPRESSION: Minimal left midlung subsegmental atelectasis or scarring. Aortic Atherosclerosis (ICD10-I70.0). Electronically Signed   By: Lupita Raider M.D.   On: 07/02/2020 14:21   DG Abd 1 View  Result Date: 07/02/2020 CLINICAL DATA:  Nausea, vomiting. EXAM: ABDOMEN - 1 VIEW COMPARISON:  June 19, 2020. FINDINGS: The bowel gas pattern is normal. No radio-opaque calculi or other significant radiographic abnormality are seen. IMPRESSION: Negative. Electronically Signed   By: Lupita Raider M.D.   On: 07/02/2020 14:22        Scheduled Meds: . atorvastatin  20 mg Oral QHS  . heparin  5,000 Units Subcutaneous Q8H  . mouth rinse  15 mL Mouth Rinse BID  . metoprolol tartrate  25 mg Oral  BID  . pantoprazole  40 mg Intravenous Daily   Continuous Infusions: . ceFEPime (MAXIPIME) IV 2 g (07/02/20 1155)  . lactated ringers 75 mL/hr at 07/02/20 0540  . metronidazole 500 mg (07/02/20 1403)     LOS: 3 days    Time spent: 35 minutes    Kyesha Balla D Sherryll Burger, DO [facility]  If 7PM-7AM, please contact night-coverage [email or web address] 07/02/2020, 2:35 PM

## 2020-07-02 NOTE — TOC Progression Note (Addendum)
Transition of Care [facility]) - Progression Note    Patient Details  Name: Brenda Hopkins MRN: [id number] Date of Birth: 11-16-1940  [facility] CM/SW Contact  V[place] Herb, [place] Phone Number: 07/02/2020, 12:16 PM  Clinical Narrative:    CSW updated Debbie with [facility] that pt and family accepted bed offer. CSW left VM for pts daughter inquiring if pt has been COVID vaccinated, awaiting callback. TOC to follow.   12:26pm: CSW received call back from pts daughter Porfirio Mylar. Pt has not been OCVID vaccinated. CSW updated Debbie with [facility] of pts vaccination status.   Expected Discharge Plan: Skilled Nursing Facility Barriers to Discharge: Continued Medical Work up  Expected Discharge Plan and Services Expected Discharge Plan: Skilled Nursing Facility       Living arrangements for the past 2 months: Single Family Home                                       Social Determinants of Health (SDOH) Interventions    Readmission Risk Interventions Readmission Risk Prevention Plan 06/30/2020  Transportation Screening Complete  HRI or Home Care Consult Complete  SW Recovery Care/Counseling Consult Complete  Palliative Care Screening Not Applicable  Skilled Nursing Facility Complete  Some recent data might be hidden

## 2020-07-03 ENCOUNTER — Ambulatory Visit: Payer: Medicare Other | Admitting: Student

## 2020-07-03 LAB — COMPREHENSIVE METABOLIC PANEL
ALT: 23 U/L (ref 0–44)
AST: 14 U/L — ABNORMAL LOW (ref 15–41)
Albumin: 1.9 g/dL — ABNORMAL LOW (ref 3.5–5.0)
Alkaline Phosphatase: 61 U/L (ref 38–126)
Anion gap: 6 (ref 5–15)
BUN: 14 mg/dL (ref 8–23)
CO2: 24 mmol/L (ref 22–32)
Calcium: 7.9 mg/dL — ABNORMAL LOW (ref 8.9–10.3)
Chloride: 111 mmol/L (ref 98–111)
Creatinine, Ser: 0.68 mg/dL (ref 0.44–1.00)
GFR, Estimated: >60 mL/min (ref >60)
Glucose, Bld: 89 mg/dL (ref 70–99)
Potassium: 3.5 mmol/L (ref 3.5–5.1)
Sodium: 141 mmol/L (ref 135–145)
Total Bilirubin: 0.3 mg/dL (ref 0.3–1.2)
Total Protein: 4.8 g/dL — ABNORMAL LOW (ref 6.5–8.1)

## 2020-07-03 LAB — GASTROINTESTINAL PANEL BY PCR, STOOL (REPLACES STOOL CULTURE)

## 2020-07-03 LAB — CBC
HCT: 28.3 % — ABNORMAL LOW (ref 36.0–46.0)
Hemoglobin: 8.5 g/dL — ABNORMAL LOW (ref 12.0–15.0)
MCH: 27.6 pg (ref 26.0–34.0)
MCHC: 30 g/dL (ref 30.0–36.0)
MCV: 91.9 fL (ref 80.0–100.0)
Platelets: 491 10*3/uL — ABNORMAL HIGH (ref 150–400)
RBC: 3.08 MIL/uL — ABNORMAL LOW (ref 3.87–5.11)
RDW: 20.4 % — ABNORMAL HIGH (ref 11.5–15.5)
WBC: 9 10*3/uL (ref 4.0–10.5)
nRBC: 0 % (ref 0.0–0.2)

## 2020-07-03 LAB — MAGNESIUM: Magnesium: 1.4 mg/dL — ABNORMAL LOW (ref 1.7–2.4)

## 2020-07-03 MED ORDER — MAGNESIUM SULFATE 2 GM/50ML IV SOLN
2.0000 g | Freq: Once | INTRAVENOUS | Status: AC
  Administered 2020-07-03: 2 g via INTRAVENOUS
  Filled 2020-07-03: qty 50

## 2020-07-03 MED ORDER — FIDAXOMICIN 200 MG PO TABS
200.0000 mg | ORAL_TABLET | Freq: Two times a day (BID) | ORAL | Status: DC
  Administered 2020-07-03 – 2020-07-06 (×7): 200 mg via ORAL
  Filled 2020-07-03 (×11): qty 1

## 2020-07-03 NOTE — Progress Notes (Signed)
Patient is resting in bed now. Patient has been up to the chair and ambulated to the [facility] with walker and nursing staff with no difficulties. Stool was collected and sent down for GI panel. Patient offers no complaints at this time. Will continue to monitor.

## 2020-07-03 NOTE — Care Management Important Message (Signed)
Important Message  Patient Details  Name: Brenda Hopkins MRN: [id number] Date of Birth: 02/09/1941   Medicare Important Message Given:  Yes     Corey Harold 07/03/2020, 4:41 PM

## 2020-07-03 NOTE — Progress Notes (Signed)
PROGRESS NOTE    Brenda Hopkins  OMB:[id number] DOB: 03-22-1940 DOA: 06/29/2020 PCP: Brenda Dapper, PA-C   Brief Narrative:  [name] a79 y.o.female,with history of hypertension, hyperlipidemia, hiatal hernia, GERD, chronic back pain presents the ED today with a chief complaint of diarrhea, possible UTI, altered mental status, and falls. Patient was recently discharged from the hospital on April 13. At that time she was offered SNF placement but refused.Patient was admitted with acute encephalopathy with SIRS criteria in the setting of recent C. difficile colitis. She is now noted to have pneumoperitoneum as well as AKI. PT recommending SNF placement.   Assessment & Plan:   Active Problems:   AKI (acute kidney injury) (HCC)   Hypoalbuminemia   Acute metabolic encephalopathy   SIRS (systemic inflammatory response syndrome) (HCC)   Hypokalemia   Falls   Pneumoperitoneum   Sepsis on admission secondary to Klebsiella pneumonia UTI -Continue on cefepime as this appears to be multidrug-resistant  Acute metabolic encephalopathy secondary to above with recurrent falls-improving -Continue to monitor closely -PT recommending SNF on discharge -Plan to resume Seroquel, gabapentin, Requip on discharge  Recurrent C. Difficile colitis -GI pathogen panel pending -Start on Dificid for 10 days after discussion with GI  Pneumoperitoneum -Likely related to recent C. difficile colitis with associated microperforation -Currently has some nausea and vomiting, continue dietary advancement slowly -Bowel movements have diminished -Continue cefepime and stop Flagyl 4/22 -Appreciate general surgery evaluation with recommendation for intervention at this time -KUB unremarkable 4/21  AKI- resolved -Likely prerenal in the setting of GI loss -Hold further IV hydration for now and monitor  Hypomagnesemia -Replete and recheck in am  Hypertension-elevated -Continue  metoprolol  GERD -IV PPI to H2 blocker given recurrent Cdiff  Acute on chronic anemia-stable -Anemia panelWNL -No overt bleeding -FOBT positive and likely related to recent colitis   DVT prophylaxis:Heparin Code Status:DNR Family Communication:Discussed with daughter on phone 4/22 Disposition Plan: Status is: Inpatient  Remains inpatient appropriate because:IV treatments appropriate due to intensity of illness or inability to take PO and Inpatient level of care appropriate due to severity of illness   Dispo: The patient is from:Home Anticipated d/c is to:[facility] Patient currently is not medically stable to d/c. Difficult to place patient No   Consultants:  General Surgery  Procedures:  See below  Antimicrobials:  Anti-infectives (From admission, onward)   Start     Dose/Rate Route Frequency Ordered Stop   07/03/20 1030  fidaxomicin (DIFICID) tablet 200 mg        200 mg Oral 2 times daily 07/03/20 0936 07/13/20 0959   07/01/20 1800  vancomycin (VANCOREADY) IVPB 750 mg/150 mL  Status:  Discontinued        750 mg 150 mL/hr over 60 Minutes Intravenous Every 48 hours 06/29/20 1959 06/30/20 1551   07/01/20 1200  ceFEPIme (MAXIPIME) 2 g in sodium chloride 0.9 % 100 mL IVPB        2 g 200 mL/hr over 30 Minutes Intravenous Every 12 hours 07/01/20 1112     06/30/20 1800  ceFEPIme (MAXIPIME) 2 g in sodium chloride 0.9 % 100 mL IVPB  Status:  Discontinued        2 g 200 mL/hr over 30 Minutes Intravenous Every 24 hours 06/29/20 1959 07/01/20 1112   06/30/20 0400  metroNIDAZOLE (FLAGYL) IVPB 500 mg  Status:  Discontinued        500 mg 100 mL/hr over 60 Minutes Intravenous Every 8 hours 06/30/20 0112 07/03/20 0936  06/29/20 1745  ceFEPIme (MAXIPIME) 2 g in sodium chloride 0.9 % 100 mL IVPB        2 g 200 mL/hr over 30 Minutes Intravenous  Once 06/29/20 1744 06/29/20 1923   06/29/20 1745  metroNIDAZOLE (FLAGYL) IVPB  500 mg        500 mg 100 mL/hr over 60 Minutes Intravenous  Once 06/29/20 1744 06/29/20 2056   06/29/20 1745  vancomycin (VANCOCIN) IVPB 1000 mg/200 mL premix        1,000 mg 200 mL/hr over 60 Minutes Intravenous  Once 06/29/20 1744 06/29/20 2056       Subjective: Patient seen and evaluated today with some ongoing abdominal pain and diarrhea today. Her mentation has improved overnight.  Objective: Vitals:   07/02/20 1319 07/02/20 2053 07/03/20 0237 07/03/20 0800  BP: 138/73 (!) 141/69 (!) 160/96 (!) 173/89  Pulse: 81 79 67 92  Resp: 16 20 18 20   Temp: 98.2 F (36.8 C) 98 F (36.7 C) 98.1 F (36.7 C) (!) 97.2 F (36.2 C)  TempSrc: Oral Oral    SpO2: 94% 95% 100% 100%  Weight:      Height:        Intake/Output Summary (Last 24 hours) at 07/03/2020 1144 Last data filed at 07/03/2020 [id number] Gross per 24 hour  Intake 2351.66 ml  Output 1 ml  Net 2350.66 ml   Filed Weights   06/30/20 0001  Weight: 64.2 kg    Examination:  General exam: Appears calm and comfortable  Respiratory system: Clear to auscultation. Respiratory effort normal. Cardiovascular system: S1 & S2 heard, RRR.  Gastrointestinal system: Abdomen is soft Central nervous system: Alert and awake Extremities: No edema Skin: No significant lesions noted Psychiatry: Flat affect.    Data Reviewed: I have personally reviewed following labs and imaging studies  CBC: Recent Labs  Lab 06/29/20 1722 06/30/20 0622 07/01/20 0621 07/02/20 0633 07/03/20 0505  WBC 14.9* 11.2* 7.1 10.7* 9.0  NEUTROABS 12.5*  --   --   --   --   HGB 9.4* 8.8* 8.7* 10.6* 8.5*  HCT 31.7* 29.8* 30.3* 35.9* 28.3*  MCV 92.2 92.3 92.9 91.3 91.9  PLT 638* 556* 599* 635* 491*   Basic Metabolic Panel: Recent Labs  Lab 06/29/20 1722 06/30/20 0622 07/01/20 0621 07/02/20 0633 07/03/20 0505  NA 138 139 142 142 141  K 3.0* 3.9 4.2 3.9 3.5  CL 109 110 114* 113* 111  CO2 21* 21* 21* 22 24  GLUCOSE 128* 80 67* 79 89  BUN 54* 45*  34* 20 14  CREATININE 2.21* 1.61* 1.06* 0.79 0.68  CALCIUM 8.2* 8.2* 8.6* 8.8* 7.9*  MG 1.9 1.5* 1.9 1.6* 1.4*   GFR: Estimated Creatinine Clearance: 49.3 mL/min (by C-G formula based on SCr of 0.68 mg/dL). Liver Function Tests: Recent Labs  Lab 06/29/20 1722 06/30/20 0622 07/01/20 0621 07/02/20 0633 07/03/20 0505  AST 89* 61* 38 30 14*  ALT 39 35 35 34 23  ALKPHOS 80 64 65 79 61  BILITOT 0.3 0.4 0.6 0.6 0.3  PROT 5.7* 5.1* 5.5* 6.3* 4.8*  ALBUMIN 2.4* 2.2* 2.3* 2.7* 1.9*   Recent Labs  Lab 06/29/20 1722  LIPASE 23   Recent Labs  Lab 06/29/20 1722  AMMONIA 18   Coagulation Profile: Recent Labs  Lab 06/29/20 1722  INR 1.2   Cardiac Enzymes: No results for input(s): CKTOTAL, CKMB, CKMBINDEX, TROPONINI in the last 168 hours. BNP (last 3 results) No results for input(s): PROBNP in the  last 8760 hours. HbA1C: No results for input(s): HGBA1C in the last 72 hours. CBG: No results for input(s): GLUCAP in the last 168 hours. Lipid Profile: No results for input(s): CHOL, HDL, LDLCALC, TRIG, CHOLHDL, LDLDIRECT in the last 72 hours. Thyroid Function Tests: No results for input(s): TSH, T4TOTAL, FREET4, T3FREE, THYROIDAB in the last 72 hours. Anemia Panel: Recent Labs    07/01/20 0621  VITAMINB12 293  FOLATE 12.3  FERRITIN 172  TIBC 186*  IRON 39  RETICCTPCT 1.4   Sepsis Labs: Recent Labs  Lab 06/29/20 1845 06/29/20 2010  LATICACIDVEN 1.4 1.9    Recent Results (from the past 240 hour(s))  Blood culture (routine x 2)     Status: None (Preliminary result)   Collection Time: 06/29/20  6:17 PM   Specimen: BLOOD  Result Value Ref Range Status   Specimen Description BLOOD RIGHT ANTECUBITAL  Final   Special Requests   Final    BOTTLES DRAWN AEROBIC AND ANAEROBIC Blood Culture adequate volume   Culture   Final    NO GROWTH 4 DAYS Performed at [facility], [address] [place], [place] [address]    Report Status PENDING  Incomplete  Resp Panel by RT-PCR  (Flu A&B, Covid) Nasopharyngeal Swab     Status: Abnormal   Collection Time: 06/29/20  6:20 PM   Specimen: Nasopharyngeal Swab; Nasopharyngeal(NP) swabs in vial transport medium  Result Value Ref Range Status   SARS Coronavirus 2 by RT PCR NEGATIVE NEGATIVE Final   Influenza A by PCR RESULTS UNAVAILABLE DUE TO INTERFERING SUBSTANCE (A) NEGATIVE Final   Influenza B by PCR RESULTS UNAVAILABLE DUE TO INTERFERING SUBSTANCE (A) NEGATIVE Final    Comment: (NOTE) The Xpert Xpress SARS-CoV-2/FLU/RSV plus assay is intended as an aid in the diagnosis of influenza from Nasopharyngeal swab specimens and should not be used as a sole basis for treatment. Nasal washings and aspirates are unacceptable for Xpert Xpress SARS-CoV-2/FLU/RSV testing.  Fact Sheet for Patients: BloggerCourse.com  Fact Sheet for Healthcare Providers: SeriousBroker.it  This test is not yet approved or cleared by the [place] FDA and has been authorized for detection and/or diagnosis of SARS-CoV-2 by FDA under an Emergency Use Authorization (EUA). This EUA will remain in effect (meaning this test can be used) for the duration of the COVID-19 declaration under Section 564(b)(1) of the Act, 21 U.S.C. section 360bbb-3(b)(1), unless the authorization is terminated or revoked.  Performed at [facility], [address] [place], [place] [address]   Blood culture (routine x 2)     Status: None (Preliminary result)   Collection Time: 06/29/20  6:39 PM   Specimen: BLOOD LEFT WRIST  Result Value Ref Range Status   Specimen Description BLOOD LEFT WRIST  Final   Special Requests   Final    BOTTLES DRAWN AEROBIC AND ANAEROBIC Blood Culture results may not be optimal due to an inadequate volume of blood received in culture bottles   Culture   Final    NO GROWTH 4 DAYS Performed at [facility], [address], [place], [place] [address]    Report Status PENDING  Incomplete   Urine Culture     Status: Abnormal   Collection Time: 06/30/20  1:29 AM   Specimen: Urine, Catheterized  Result Value Ref Range Status   Specimen Description   Final    URINE, CATHETERIZED Performed at [facility], [address], [place], [place] [address]    Special Requests   Final    NONE  Performed at [facility], [address], [place], [place] [address]    Culture (A)  Final    10,000 COLONIES/mL KLEBSIELLA PNEUMONIAE Confirmed Extended Spectrum Beta-Lactamase Producer (ESBL).  In bloodstream infections from ESBL organisms, carbapenems are preferred over piperacillin/tazobactam. They are shown to have a lower risk of mortality.    Report Status 07/02/2020 FINAL  Final   Organism ID, Bacteria KLEBSIELLA PNEUMONIAE (A)  Final      Susceptibility   Klebsiella pneumoniae - MIC*    AMPICILLIN >=32 RESISTANT Resistant     CEFAZOLIN >=64 RESISTANT Resistant     CEFEPIME 1 SENSITIVE Sensitive     CEFTRIAXONE 16 RESISTANT Resistant     CIPROFLOXACIN >=4 RESISTANT Resistant     GENTAMICIN 8 INTERMEDIATE Intermediate     IMIPENEM <=0.25 SENSITIVE Sensitive     NITROFURANTOIN 128 RESISTANT Resistant     TRIMETH/SULFA <=20 SENSITIVE Sensitive     AMPICILLIN/SULBACTAM >=32 RESISTANT Resistant     PIP/TAZO 16 SENSITIVE Sensitive     * 10,000 COLONIES/mL KLEBSIELLA PNEUMONIAE         Radiology Studies: DG Chest 1 View  Result Date: 07/02/2020 CLINICAL DATA:  Chest pain. EXAM: CHEST  1 VIEW COMPARISON:  June 29, 2020. FINDINGS: Stable cardiomediastinal silhouette. No pneumothorax or pleural effusion is noted. Minimal left midlung subsegmental atelectasis or scarring is noted. Old bilateral rib fractures are noted. IMPRESSION: Minimal left midlung subsegmental atelectasis or scarring. Aortic Atherosclerosis (ICD10-I70.0). Electronically Signed   By: Lupita Raider M.D.   On: 07/02/2020 14:21   DG Abd 1 View  Result Date: 07/02/2020 CLINICAL DATA:  Nausea, vomiting. EXAM: ABDOMEN  - 1 VIEW COMPARISON:  June 19, 2020. FINDINGS: The bowel gas pattern is normal. No radio-opaque calculi or other significant radiographic abnormality are seen. IMPRESSION: Negative. Electronically Signed   By: Lupita Raider M.D.   On: 07/02/2020 14:22        Scheduled Meds: . atorvastatin  20 mg Oral QHS  . fidaxomicin  200 mg Oral BID  . heparin  5,000 Units Subcutaneous Q8H  . mouth rinse  15 mL Mouth Rinse BID  . metoprolol tartrate  25 mg Oral BID  . pantoprazole  40 mg Intravenous Daily  . sodium chloride flush  10-40 mL Intracatheter Q12H   Continuous Infusions: . ceFEPime (MAXIPIME) IV 2 g (07/03/20 0815)     LOS: 4 days    Time spent: 35 minutes    Khaleesi Gruel D Sherryll Burger, DO [facility]  If 7PM-7AM, please contact night-coverage [email or web address] 07/03/2020, 11:44 AM

## 2020-07-04 ENCOUNTER — Inpatient Hospital Stay (HOSPITAL_COMMUNITY): Payer: Medicare Other

## 2020-07-04 LAB — CBC
HCT: 32.8 % — ABNORMAL LOW (ref 36.0–46.0)
Hemoglobin: 9.8 g/dL — ABNORMAL LOW (ref 12.0–15.0)
MCH: 27.5 pg (ref 26.0–34.0)
MCHC: 29.9 g/dL — ABNORMAL LOW (ref 30.0–36.0)
MCV: 92.1 fL (ref 80.0–100.0)
Platelets: 462 10*3/uL — ABNORMAL HIGH (ref 150–400)
RBC: 3.56 MIL/uL — ABNORMAL LOW (ref 3.87–5.11)
RDW: 20.4 % — ABNORMAL HIGH (ref 11.5–15.5)
WBC: 10.6 10*3/uL — ABNORMAL HIGH (ref 4.0–10.5)
nRBC: 0 % (ref 0.0–0.2)

## 2020-07-04 LAB — TROPONIN I (HIGH SENSITIVITY)
Troponin I (High Sensitivity): 14 ng/L (ref <18)
Troponin I (High Sensitivity): 14 ng/L (ref <18)

## 2020-07-04 LAB — CULTURE, BLOOD (ROUTINE X 2)
Culture: NO GROWTH
Culture: NO GROWTH
Special Requests: ADEQUATE

## 2020-07-04 LAB — MAGNESIUM: Magnesium: 1.6 mg/dL — ABNORMAL LOW (ref 1.7–2.4)

## 2020-07-04 LAB — BASIC METABOLIC PANEL
Anion gap: 6 (ref 5–15)
BUN: 10 mg/dL (ref 8–23)
CO2: 25 mmol/L (ref 22–32)
Calcium: 8.1 mg/dL — ABNORMAL LOW (ref 8.9–10.3)
Chloride: 108 mmol/L (ref 98–111)
Creatinine, Ser: 0.64 mg/dL (ref 0.44–1.00)
GFR, Estimated: >60 mL/min (ref >60)
Glucose, Bld: 87 mg/dL (ref 70–99)
Potassium: 3.5 mmol/L (ref 3.5–5.1)
Sodium: 139 mmol/L (ref 135–145)

## 2020-07-04 MED ORDER — IPRATROPIUM-ALBUTEROL 0.5-2.5 (3) MG/3ML IN SOLN: 3.0000 mL | Freq: Four times a day (QID) | RESPIRATORY_TRACT | Status: DC | PRN

## 2020-07-04 MED ORDER — NYSTATIN 100000 UNIT/GM EX POWD
Freq: Two times a day (BID) | CUTANEOUS | Status: DC
  Filled 2020-07-04 (×2): qty 15

## 2020-07-04 MED ORDER — QUETIAPINE FUMARATE 100 MG PO TABS
200.0000 mg | ORAL_TABLET | Freq: Every day | ORAL | Status: DC
  Administered 2020-07-04 – 2020-07-05 (×2): 200 mg via ORAL
  Filled 2020-07-04 (×2): qty 2

## 2020-07-04 MED ORDER — MAGNESIUM SULFATE 2 GM/50ML IV SOLN
2.0000 g | Freq: Once | INTRAVENOUS | Status: AC
  Administered 2020-07-04: 2 g via INTRAVENOUS
  Filled 2020-07-04: qty 50

## 2020-07-04 MED ORDER — HALOPERIDOL LACTATE 5 MG/ML IJ SOLN
2.0000 mg | Freq: Once | INTRAMUSCULAR | Status: AC
  Administered 2020-07-04: 2 mg via INTRAMUSCULAR
  Filled 2020-07-04: qty 1

## 2020-07-04 NOTE — Progress Notes (Signed)
Contacted patient's daughter Servando Snare regarding patient being placed in restraints.  Daughter stated she understood and this is not new behavior for her mother.

## 2020-07-04 NOTE — Progress Notes (Signed)
Pt requested [place] to removed hand mitts. Removed hand mitts. Will reassess need for restraints or mitts.

## 2020-07-04 NOTE — Progress Notes (Signed)
Nurse tech was performing pt care. Removed restraints, pt tolerated well. Will reassess need for soft wrist restraints throughout shift.

## 2020-07-04 NOTE — Progress Notes (Signed)
Patient attempting to get out of bed, continuously undressing, pulling purewick out, screaming help me.  Patient has been given Ativan IM and PO 4 hours apart per PRN schedule with no change in patient actions.  Patient also given PO Oxycodone for pain per PRN schedule.  Patient asking for Morphine.  Went to give the Morphine and patient had pulled her PICC line out.  This was noticed as I was pushing the Morphine and my hand was getting wet.  Patient received none of the dose of morphine remainder wasted in box in medication room.  Messaged MD for order for soft wrist restraints as patient is pulling lines/tubes out, climbing out of bed and may hurt herself.  Order received for soft wrist restraints at 0238.  Bilateral soft wrist restraints placed on patient at 0240.  Called for ultrasound placement of IV in order to give patient further IV medications.  J. Sapplet, RN, placed Ultrasound IV and Morphine given at that time.  Patient continues to scream for no apparent reason.  When asked why she is screaming she states she is not screaming.

## 2020-07-04 NOTE — Progress Notes (Signed)
Patient placed in soft wrist restraints for patient safety and interference with medical lines/tubes

## 2020-07-04 NOTE — Progress Notes (Signed)
Patient has again pulled out IV access.  Patient is very difficult stick.  Will inform MD and ask all IV medications be changed to PO or IM.

## 2020-07-04 NOTE — Progress Notes (Signed)
Patient yelling out from room this nurse Fleet Contras) and LPN Florentina Addison) went into patients room asked if patient was ok patient stated her chest and stomach hurt. This nurse asked patient if her chest pain felt like someone sitting on her chest patient stated yes. Patient would start to have labored breathing with audible wheezing. Oxygen saturation is 93 on room air.   MD paged. Katie LPN  gave patient  PRN morphine refer to mar for correct dosage. MD placed order for 12 lead EKG and stat Troponin level. Will continue to monitor throughout shift.

## 2020-07-04 NOTE — Progress Notes (Signed)
Pt restless attempted to get out of bed. Pt reoriented to situation, hand mitts applied. Pt now resting in bed eyes closed breathing even and non labored.

## 2020-07-04 NOTE — Progress Notes (Signed)
PROGRESS NOTE    Brenda Hopkins  HQI:[id number] DOB: [birth year] DOA: 06/29/2020 PCP: Shawnie Dapper, PA-C   Brief Narrative:   [name] a79 y.o.female,with history of hypertension, hyperlipidemia, hiatal hernia, GERD, chronic back pain presents the ED today with a chief complaint of diarrhea, possible UTI, altered mental status, and falls. Patient was recently discharged from the hospital on April 13. At that time she was offered SNF placement but refused.Patient was admitted with acute encephalopathy with SIRS criteria in the setting of recent C. difficile colitis. She is now noted to have pneumoperitoneum as well as AKI. PT recommending SNF placement.  Assessment & Plan:   Active Problems:   AKI (acute kidney injury) (HCC)   Hypoalbuminemia   Acute metabolic encephalopathy   SIRS (systemic inflammatory response syndrome) (HCC)   Hypokalemia   Falls   Pneumoperitoneum   Sepsis on admission secondary toKlebsiella pneumonia UTI -Continue on cefepime as this appears to be multidrug-resistant -Discontinued 4/23 after 5 days of treatment  Acute metabolic encephalopathy secondary to above with recurrent falls-improving -Continue to monitor closely -PT recommending SNF on discharge -Plan to resume Seroquel tonight due to agitation and need for restraints  Recurrent C. Difficile colitis -GI pathogen panel pending -Start on Dificid for 10 days starting 4/22 after discussion with GI  Pneumoperitoneum -Likely related to recent C. difficile colitis with associated microperforation -Currently has some nausea and vomiting, continue dietary advancement slowly -Bowel movements have diminished -Continue cefepime and stop Flagyl 4/22 -Appreciate general surgery evaluation with recommendation for intervention at this time -KUB unremarkable4/21  AKI-resolved -Likely prerenal in the setting of GI loss -Hold further IV hydration for now and  monitor  Hypomagnesemia -Replete and recheck in am  Hypertension-elevated -Continue metoprolol  GERD -IV PPI to H2 blocker given recurrent Cdiff  Acute on chronic anemia-stable -Anemia panelWNL -No overt bleeding -FOBT positive and likely related to recent colitis   DVT prophylaxis:Heparin Code Status:DNR Family Communication:Discussed with daughter on phone 4/23 Disposition Plan: Status is: Inpatient  Remains inpatient appropriate because:IV treatments appropriate due to intensity of illness or inability to take PO and Inpatient level of care appropriate due to severity of illness   Dispo: The patient is from:Home Anticipated d/c is to:[facility] Patient currently is not medically stable to d/c. Difficult to place patient No   Consultants:  General Surgery  Procedures:  See below   Subjective: Patient seen and evaluated today with agitation overnight which led to her pulling out her PICC line which led to restraints being placed. No BM recorded.  Objective: Vitals:   07/03/20 0800 07/03/20 2058 07/04/20 0528 07/04/20 1020  BP: (!) 173/89 (!) 142/71 (!) 175/92 (!) 159/65  Pulse: 92 65 85 64  Resp: 20 18 18    Temp: (!) 97.2 F (36.2 C) 98.6 F (37 C) 98.7 F (37.1 C)   TempSrc:  Oral    SpO2: 100% 96% 93%   Weight:      Height:        Intake/Output Summary (Last 24 hours) at 07/04/2020 1251 Last data filed at 07/04/2020 0900 Gross per 24 hour  Intake 0 ml  Output 450 ml  Net -450 ml   Filed Weights   06/30/20 0001  Weight: 64.2 kg    Examination:  General exam: Appears calm and comfortable, restrained Respiratory system: Clear to auscultation. Respiratory effort normal. Cardiovascular system: S1 & S2 heard, RRR.  Gastrointestinal system: Abdomen is soft Central nervous system: Somnolent but arousable Extremities: No edema Skin: No  significant lesions noted Psychiatry: Flat  affect.    Data Reviewed: I have personally reviewed following labs and imaging studies  CBC: Recent Labs  Lab 06/29/20 1722 06/30/20 0622 07/01/20 0621 07/02/20 0633 07/03/20 0505 07/04/20 0554  WBC 14.9* 11.2* 7.1 10.7* 9.0 10.6*  NEUTROABS 12.5*  --   --   --   --   --   HGB 9.4* 8.8* 8.7* 10.6* 8.5* 9.8*  HCT 31.7* 29.8* 30.3* 35.9* 28.3* 32.8*  MCV 92.2 92.3 92.9 91.3 91.9 92.1  PLT 638* 556* 599* 635* 491* 462*   Basic Metabolic Panel: Recent Labs  Lab 06/30/20 0622 07/01/20 0621 07/02/20 0633 07/03/20 0505 07/04/20 0554  NA 139 142 142 141 139  K 3.9 4.2 3.9 3.5 3.5  CL 110 114* 113* 111 108  CO2 21* 21* 22 24 25   GLUCOSE 80 67* 79 89 87  BUN 45* 34* 20 14 10   CREATININE 1.61* 1.06* 0.79 0.68 0.64  CALCIUM 8.2* 8.6* 8.8* 7.9* 8.1*  MG 1.5* 1.9 1.6* 1.4* 1.6*   GFR: Estimated Creatinine Clearance: 49.3 mL/min (by C-G formula based on SCr of 0.64 mg/dL). Liver Function Tests: Recent Labs  Lab 06/29/20 1722 06/30/20 0622 07/01/20 0621 07/02/20 0633 07/03/20 0505  AST 89* 61* 38 30 14*  ALT 39 35 35 34 23  ALKPHOS 80 64 65 79 61  BILITOT 0.3 0.4 0.6 0.6 0.3  PROT 5.7* 5.1* 5.5* 6.3* 4.8*  ALBUMIN 2.4* 2.2* 2.3* 2.7* 1.9*   Recent Labs  Lab 06/29/20 1722  LIPASE 23   Recent Labs  Lab 06/29/20 1722  AMMONIA 18   Coagulation Profile: Recent Labs  Lab 06/29/20 1722  INR 1.2   Cardiac Enzymes: No results for input(s): CKTOTAL, CKMB, CKMBINDEX, TROPONINI in the last 168 hours. BNP (last 3 results) No results for input(s): PROBNP in the last 8760 hours. HbA1C: No results for input(s): HGBA1C in the last 72 hours. CBG: No results for input(s): GLUCAP in the last 168 hours. Lipid Profile: No results for input(s): CHOL, HDL, LDLCALC, TRIG, CHOLHDL, LDLDIRECT in the last 72 hours. Thyroid Function Tests: No results for input(s): TSH, T4TOTAL, FREET4, T3FREE, THYROIDAB in the last 72 hours. Anemia Panel: No results for input(s): VITAMINB12,  FOLATE, FERRITIN, TIBC, IRON, RETICCTPCT in the last 72 hours. Sepsis Labs: Recent Labs  Lab 06/29/20 1845 06/29/20 2010  LATICACIDVEN 1.4 1.9    Recent Results (from the past 240 hour(s))  Blood culture (routine x 2)     Status: None   Collection Time: 06/29/20  6:17 PM   Specimen: BLOOD  Result Value Ref Range Status   Specimen Description BLOOD RIGHT ANTECUBITAL  Final   Special Requests   Final    BOTTLES DRAWN AEROBIC AND ANAEROBIC Blood Culture adequate volume   Culture   Final    NO GROWTH 5 DAYS Performed at [facility], [address], [place], [address] [place]    Report Status 07/04/2020 FINAL  Final  Resp Panel by RT-PCR (Flu A&B, Covid) Nasopharyngeal Swab     Status: Abnormal   Collection Time: 06/29/20  6:20 PM   Specimen: Nasopharyngeal Swab; Nasopharyngeal(NP) swabs in vial transport medium  Result Value Ref Range Status   SARS Coronavirus 2 by RT PCR NEGATIVE NEGATIVE Final   Influenza A by PCR RESULTS UNAVAILABLE DUE TO INTERFERING SUBSTANCE (A) NEGATIVE Final   Influenza B by PCR RESULTS UNAVAILABLE DUE TO INTERFERING SUBSTANCE (A) NEGATIVE Final    Comment: (NOTE) The Xpert Xpress  SARS-CoV-2/FLU/RSV plus assay is intended as an aid in the diagnosis of influenza from Nasopharyngeal swab specimens and should not be used as a sole basis for treatment. Nasal washings and aspirates are unacceptable for Xpert Xpress SARS-CoV-2/FLU/RSV testing.  Fact Sheet for Patients: BloggerCourse.com  Fact Sheet for Healthcare Providers: SeriousBroker.it  This test is not yet approved or cleared by the [place] FDA and has been authorized for detection and/or diagnosis of SARS-CoV-2 by FDA under an Emergency Use Authorization (EUA). This EUA will remain in effect (meaning this test can be used) for the duration of the COVID-19 declaration under Section 564(b)(1) of the Act, 21 U.S.C. section 360bbb-3(b)(1), unless  the authorization is terminated or revoked.  Performed at [facility], [address], [place], [place] [address]   Blood culture (routine x 2)     Status: None   Collection Time: 06/29/20  6:39 PM   Specimen: BLOOD LEFT WRIST  Result Value Ref Range Status   Specimen Description BLOOD LEFT WRIST  Final   Special Requests   Final    BOTTLES DRAWN AEROBIC AND ANAEROBIC Blood Culture results may not be optimal due to an inadequate volume of blood received in culture bottles   Culture   Final    NO GROWTH 5 DAYS Performed at [facility], [address] [place], [place] [address]    Report Status 07/04/2020 FINAL  Final  Urine Culture     Status: Abnormal   Collection Time: 06/30/20  1:29 AM   Specimen: Urine, Catheterized  Result Value Ref Range Status   Specimen Description   Final    URINE, CATHETERIZED Performed at [facility], [address], [place], [place] [address]    Special Requests   Final    NONE Performed at [facility], [address], [place], [place] [address]    Culture (A)  Final    10,000 COLONIES/mL KLEBSIELLA PNEUMONIAE Confirmed Extended Spectrum Beta-Lactamase Producer (ESBL).  In bloodstream infections from ESBL organisms, carbapenems are preferred over piperacillin/tazobactam. They are shown to have a lower risk of mortality.    Report Status 07/02/2020 FINAL  Final   Organism ID, Bacteria KLEBSIELLA PNEUMONIAE (A)  Final      Susceptibility   Klebsiella pneumoniae - MIC*    AMPICILLIN >=32 RESISTANT Resistant     CEFAZOLIN >=64 RESISTANT Resistant     CEFEPIME 1 SENSITIVE Sensitive     CEFTRIAXONE 16 RESISTANT Resistant     CIPROFLOXACIN >=4 RESISTANT Resistant     GENTAMICIN 8 INTERMEDIATE Intermediate     IMIPENEM <=0.25 SENSITIVE Sensitive     NITROFURANTOIN 128 RESISTANT Resistant     TRIMETH/SULFA <=20 SENSITIVE Sensitive     AMPICILLIN/SULBACTAM >=32 RESISTANT Resistant     PIP/TAZO 16 SENSITIVE Sensitive     * 10,000 COLONIES/mL  KLEBSIELLA PNEUMONIAE  Gastrointestinal Panel by PCR , Stool     Status: None   Collection Time: 07/03/20  8:49 AM   Specimen: Stool  Result Value Ref Range Status   Campylobacter species NOT DETECTED NOT DETECTED Final   Plesimonas shigelloides NOT DETECTED NOT DETECTED Final   Salmonella species NOT DETECTED NOT DETECTED Final   Yersinia enterocolitica NOT DETECTED NOT DETECTED Final   Vibrio species NOT DETECTED NOT DETECTED Final   Vibrio cholerae NOT DETECTED NOT DETECTED Final   Enteroaggregative E coli (EAEC) NOT DETECTED NOT DETECTED Final   Enteropathogenic E coli (EPEC) NOT DETECTED NOT DETECTED Final   Enterotoxigenic E coli (ETEC) NOT DETECTED  NOT DETECTED Final   Shiga like toxin producing E coli (STEC) NOT DETECTED NOT DETECTED Final   Shigella/Enteroinvasive E coli (EIEC) NOT DETECTED NOT DETECTED Final   Cryptosporidium NOT DETECTED NOT DETECTED Final   Cyclospora cayetanensis NOT DETECTED NOT DETECTED Final   Entamoeba histolytica NOT DETECTED NOT DETECTED Final   Giardia lamblia NOT DETECTED NOT DETECTED Final   Adenovirus F40/41 NOT DETECTED NOT DETECTED Final   Astrovirus NOT DETECTED NOT DETECTED Final   Norovirus GI/GII NOT DETECTED NOT DETECTED Final   Rotavirus A NOT DETECTED NOT DETECTED Final   Sapovirus (I, II, IV, and V) NOT DETECTED NOT DETECTED Final    Comment: Performed at [facility], [address] [place], [place] [address]         Radiology Studies: No results found.      Scheduled Meds: . atorvastatin  20 mg Oral QHS  . fidaxomicin  200 mg Oral BID  . heparin  5,000 Units Subcutaneous Q8H  . mouth rinse  15 mL Mouth Rinse BID  . metoprolol tartrate  25 mg Oral BID  . pantoprazole  40 mg Intravenous Daily  . QUEtiapine  200 mg Oral QHS  . sodium chloride flush  10-40 mL Intracatheter Q12H    LOS: 5 days    Time spent: 35 minutes    Kamira Mellette Hoover Brunette Cleophas Yoak, DO [facility]  If 7PM-7AM, please contact  night-coverage [email or web address] 07/04/2020, 12:51 PM

## 2020-07-05 ENCOUNTER — Encounter (HOSPITAL_COMMUNITY): Payer: Self-pay | Admitting: Family Medicine

## 2020-07-05 LAB — BASIC METABOLIC PANEL
Anion gap: 8 (ref 5–15)
BUN: 6 mg/dL — ABNORMAL LOW (ref 8–23)
CO2: 28 mmol/L (ref 22–32)
Calcium: 8.1 mg/dL — ABNORMAL LOW (ref 8.9–10.3)
Chloride: 105 mmol/L (ref 98–111)
Creatinine, Ser: 0.56 mg/dL (ref 0.44–1.00)
GFR, Estimated: >60 mL/min (ref >60)
Glucose, Bld: 90 mg/dL (ref 70–99)
Potassium: 3.1 mmol/L — ABNORMAL LOW (ref 3.5–5.1)
Sodium: 141 mmol/L (ref 135–145)

## 2020-07-05 LAB — MAGNESIUM: Magnesium: 1.5 mg/dL — ABNORMAL LOW (ref 1.7–2.4)

## 2020-07-05 MED ORDER — POTASSIUM CHLORIDE CRYS ER 20 MEQ PO TBCR
40.0000 meq | EXTENDED_RELEASE_TABLET | Freq: Once | ORAL | Status: AC
  Administered 2020-07-05: 40 meq via ORAL
  Filled 2020-07-05: qty 2

## 2020-07-05 MED ORDER — PANTOPRAZOLE SODIUM 40 MG PO TBEC: 40.0000 mg | DELAYED_RELEASE_TABLET | Freq: Every day | ORAL | Status: DC

## 2020-07-05 MED ORDER — FAMOTIDINE 20 MG PO TABS
10.0000 mg | ORAL_TABLET | Freq: Two times a day (BID) | ORAL | Status: DC
  Administered 2020-07-05 – 2020-07-06 (×3): 10 mg via ORAL
  Filled 2020-07-05 (×3): qty 1

## 2020-07-05 MED ORDER — MAGNESIUM OXIDE 400 (241.3 MG) MG PO TABS
400.0000 mg | ORAL_TABLET | Freq: Two times a day (BID) | ORAL | Status: DC
  Administered 2020-07-05 – 2020-07-06 (×3): 400 mg via ORAL
  Filled 2020-07-05 (×3): qty 1

## 2020-07-05 NOTE — Plan of Care (Signed)
Patient has poor awareness for monitoring plan of care and improving education. Previously discussed with family who are hoping for her to be discharged to a SNF facility. We have transitioned to IV medications and discontinued cardiac monitoring this morning to progress care plan for discharged.

## 2020-07-05 NOTE — Progress Notes (Signed)
PROGRESS NOTE    Ivar DrapeBrenda Gail Dalgleish  WUJ:[id number] DOB: 10/25/1940 DOA: 06/29/2020 PCP: Shawnie DapperMann, Benjamin L, PA-C   Brief Narrative:   [name] a79 y.o.female,with history of hypertension, hyperlipidemia, hiatal hernia, GERD, chronic back pain presents the ED today with a chief complaint of diarrhea, possible UTI, altered mental status, and falls. Patient was recently discharged from the hospital on April 13. At that time she was offered SNF placement but refused.Patient was admitted with acute encephalopathy with SIRS criteria in the setting of recent C. difficile colitis. She is now noted to have pneumoperitoneum as well as AKI. PT recommending SNF placement.  She continues to have some ongoing periods of agitation.  Assessment & Plan:   Active Problems:   AKI (acute kidney injury) (HCC)   Hypoalbuminemia   Acute metabolic encephalopathy   SIRS (systemic inflammatory response syndrome) (HCC)   Hypokalemia   Falls   Pneumoperitoneum   Sepsis on admission secondary toKlebsiella pneumonia UTI -Continue on cefepime as this appears to be multidrug-resistant -Discontinued 4/23 after 5 days of treatment  Acute metabolic encephalopathy secondary to above with recurrent falls-labile -Continue to monitor closely -PT recommending SNF on discharge -Continue nighttime Seroquel, patient did not require restraints overnight  Recurrent C. Difficile colitis -GI pathogen panel pending -Started on Dificid for 10 days starting 4/22 after discussion with GI  Pneumoperitoneum -Likely related to recent C. difficile colitis with associated microperforation -Currently has some nausea and vomiting, continue dietary advancement slowly -Bowel movements have diminished -Continue cefepimeand stop Flagyl 4/22 -Appreciate general surgery evaluation with recommendation for intervention at this time -KUB unremarkable4/21  AKI-resolved -Likely prerenal in the setting of GI  loss -Hold further IV hydration for now and monitor  Hypomagnesemia/hypokalemia -Replete and recheck in am  Hypertension-elevated -Continue metoprolol  GERD -Oral famotidine  Acute on chronic anemia-stable -Anemia panelWNL -No overt bleeding -FOBT positive and likely related to recent colitis   DVT prophylaxis:Heparin Code Status:DNR Family Communication:Discussed with daughter on phone 4/23 Disposition Plan: Status is: Inpatient  Remains inpatient appropriate because:IV treatments appropriate due to intensity of illness or inability to take PO and Inpatient level of care appropriate due to severity of illness   Dispo: The patient is from:Home Anticipated d/c is to:[facility] Patient currently is not medically stable to d/c. Difficult to place patient No   Consultants:  General Surgery  Procedures:  See below  Antimicrobials:  Anti-infectives (From admission, onward)   Start     Dose/Rate Route Frequency Ordered Stop   07/03/20 1030  fidaxomicin (DIFICID) tablet 200 mg        200 mg Oral 2 times daily 07/03/20 0936 07/13/20 0959   07/01/20 1800  vancomycin (VANCOREADY) IVPB 750 mg/150 mL  Status:  Discontinued        750 mg 150 mL/hr over 60 Minutes Intravenous Every 48 hours 06/29/20 1959 06/30/20 1551   07/01/20 1200  ceFEPIme (MAXIPIME) 2 g in sodium chloride 0.9 % 100 mL IVPB  Status:  Discontinued        2 g 200 mL/hr over 30 Minutes Intravenous Every 12 hours 07/01/20 1112 07/04/20 0723   06/30/20 1800  ceFEPIme (MAXIPIME) 2 g in sodium chloride 0.9 % 100 mL IVPB  Status:  Discontinued        2 g 200 mL/hr over 30 Minutes Intravenous Every 24 hours 06/29/20 1959 07/01/20 1112   06/30/20 0400  metroNIDAZOLE (FLAGYL) IVPB 500 mg  Status:  Discontinued        500 mg 100  mL/hr over 60 Minutes Intravenous Every 8 hours 06/30/20 0112 07/03/20 0936   06/29/20 1745  ceFEPIme (MAXIPIME) 2 g in sodium  chloride 0.9 % 100 mL IVPB        2 g 200 mL/hr over 30 Minutes Intravenous  Once 06/29/20 1744 06/29/20 1923   06/29/20 1745  metroNIDAZOLE (FLAGYL) IVPB 500 mg        500 mg 100 mL/hr over 60 Minutes Intravenous  Once 06/29/20 1744 06/29/20 2056   06/29/20 1745  vancomycin (VANCOCIN) IVPB 1000 mg/200 mL premix        1,000 mg 200 mL/hr over 60 Minutes Intravenous  Once 06/29/20 1744 06/29/20 2056       Subjective: Patient seen and evaluated today with some agitation overnight where she pulled out an IV line, but no restraints were needed. She appears to require a sitter at bedside, however.  Objective: Vitals:   07/04/20 1020 07/04/20 1417 07/04/20 2132 07/05/20 0500  BP: (!) 159/65 (!) 142/92 (!) 175/96 (!) 168/89  Pulse: 64 66 64 74  Resp:  18 18 18   Temp:  98.2 F (36.8 C) 97.6 F (36.4 C) 97.8 F (36.6 C)  TempSrc:   Oral Oral  SpO2:  99% 94% 95%  Weight:      Height:        Intake/Output Summary (Last 24 hours) at 07/05/2020 1027 Last data filed at 07/04/2020 1709 Gross per 24 hour  Intake 180 ml  Output --  Net 180 ml   Filed Weights   06/30/20 0001  Weight: 64.2 kg    Examination:  General exam: Appears somnolent, but arousable Respiratory system: Clear to auscultation. Respiratory effort normal. Cardiovascular system: S1 & S2 heard, RRR.  Gastrointestinal system: Abdomen is soft Central nervous system: Somnolent Extremities: No edema Skin: No significant lesions noted Psychiatry: Flat affect.    Data Reviewed: I have personally reviewed following labs and imaging studies  CBC: Recent Labs  Lab 06/29/20 1722 06/30/20 0622 07/01/20 0621 07/02/20 0633 07/03/20 0505 07/04/20 0554  WBC 14.9* 11.2* 7.1 10.7* 9.0 10.6*  NEUTROABS 12.5*  --   --   --   --   --   HGB 9.4* 8.8* 8.7* 10.6* 8.5* 9.8*  HCT 31.7* 29.8* 30.3* 35.9* 28.3* 32.8*  MCV 92.2 92.3 92.9 91.3 91.9 92.1  PLT 638* 556* 599* 635* 491* 462*   Basic Metabolic Panel: Recent Labs   Lab 07/01/20 0621 07/02/20 0633 07/03/20 0505 07/04/20 0554 07/05/20 0715  NA 142 142 141 139 141  K 4.2 3.9 3.5 3.5 3.1*  CL 114* 113* 111 108 105  CO2 21* 22 24 25 28   GLUCOSE 67* 79 89 87 90  BUN 34* 20 14 10  6*  CREATININE 1.06* 0.79 0.68 0.64 0.56  CALCIUM 8.6* 8.8* 7.9* 8.1* 8.1*  MG 1.9 1.6* 1.4* 1.6* 1.5*   GFR: Estimated Creatinine Clearance: 49.3 mL/min (by C-G formula based on SCr of 0.56 mg/dL). Liver Function Tests: Recent Labs  Lab 06/29/20 1722 06/30/20 0622 07/01/20 0621 07/02/20 0633 07/03/20 0505  AST 89* 61* 38 30 14*  ALT 39 35 35 34 23  ALKPHOS 80 64 65 79 61  BILITOT 0.3 0.4 0.6 0.6 0.3  PROT 5.7* 5.1* 5.5* 6.3* 4.8*  ALBUMIN 2.4* 2.2* 2.3* 2.7* 1.9*   Recent Labs  Lab 06/29/20 1722  LIPASE 23   Recent Labs  Lab 06/29/20 1722  AMMONIA 18   Coagulation Profile: Recent Labs  Lab 06/29/20 1722  INR  1.2   Cardiac Enzymes: No results for input(s): CKTOTAL, CKMB, CKMBINDEX, TROPONINI in the last 168 hours. BNP (last 3 results) No results for input(s): PROBNP in the last 8760 hours. HbA1C: No results for input(s): HGBA1C in the last 72 hours. CBG: No results for input(s): GLUCAP in the last 168 hours. Lipid Profile: No results for input(s): CHOL, HDL, LDLCALC, TRIG, CHOLHDL, LDLDIRECT in the last 72 hours. Thyroid Function Tests: No results for input(s): TSH, T4TOTAL, FREET4, T3FREE, THYROIDAB in the last 72 hours. Anemia Panel: No results for input(s): VITAMINB12, FOLATE, FERRITIN, TIBC, IRON, RETICCTPCT in the last 72 hours. Sepsis Labs: Recent Labs  Lab 06/29/20 1845 06/29/20 2010  LATICACIDVEN 1.4 1.9    Recent Results (from the past 240 hour(s))  Blood culture (routine x 2)     Status: None   Collection Time: 06/29/20  6:17 PM   Specimen: BLOOD  Result Value Ref Range Status   Specimen Description BLOOD RIGHT ANTECUBITAL  Final   Special Requests   Final    BOTTLES DRAWN AEROBIC AND ANAEROBIC Blood Culture adequate  volume   Culture   Final    NO GROWTH 5 DAYS Performed at [facility], [address], [place], [place] [address]    Report Status 07/04/2020 FINAL  Final  Resp Panel by RT-PCR (Flu A&B, Covid) Nasopharyngeal Swab     Status: Abnormal   Collection Time: 06/29/20  6:20 PM   Specimen: Nasopharyngeal Swab; Nasopharyngeal(NP) swabs in vial transport medium  Result Value Ref Range Status   SARS Coronavirus 2 by RT PCR NEGATIVE NEGATIVE Final   Influenza A by PCR RESULTS UNAVAILABLE DUE TO INTERFERING SUBSTANCE (A) NEGATIVE Final   Influenza B by PCR RESULTS UNAVAILABLE DUE TO INTERFERING SUBSTANCE (A) NEGATIVE Final    Comment: (NOTE) The Xpert Xpress SARS-CoV-2/FLU/RSV plus assay is intended as an aid in the diagnosis of influenza from Nasopharyngeal swab specimens and should not be used as a sole basis for treatment. Nasal washings and aspirates are unacceptable for Xpert Xpress SARS-CoV-2/FLU/RSV testing.  Fact Sheet for Patients: BloggerCourse.com  Fact Sheet for Healthcare Providers: SeriousBroker.it  This test is not yet approved or cleared by the [place] FDA and has been authorized for detection and/or diagnosis of SARS-CoV-2 by FDA under an Emergency Use Authorization (EUA). This EUA will remain in effect (meaning this test can be used) for the duration of the COVID-19 declaration under Section 564(b)(1) of the Act, 21 U.S.C. section 360bbb-3(b)(1), unless the authorization is terminated or revoked.  Performed at [facility], [address] [place], [place] [address]   Blood culture (routine x 2)     Status: None   Collection Time: 06/29/20  6:39 PM   Specimen: BLOOD LEFT WRIST  Result Value Ref Range Status   Specimen Description BLOOD LEFT WRIST  Final   Special Requests   Final    BOTTLES DRAWN AEROBIC AND ANAEROBIC Blood Culture results may not be optimal due to an inadequate volume of blood received in culture  bottles   Culture   Final    NO GROWTH 5 DAYS Performed at [facility], [address] [place], [place] [address]    Report Status 07/04/2020 FINAL  Final  Urine Culture     Status: Abnormal   Collection Time: 06/30/20  1:29 AM   Specimen: Urine, Catheterized  Result Value Ref Range Status   Specimen Description   Final    URINE, CATHETERIZED Performed at [facility] [address],  [place], [place] [address]    Special Requests   Final    NONE Performed at [facility], [address], [place], [place] [address]    Culture (A)  Final    10,000 COLONIES/mL KLEBSIELLA PNEUMONIAE Confirmed Extended Spectrum Beta-Lactamase Producer (ESBL).  In bloodstream infections from ESBL organisms, carbapenems are preferred over piperacillin/tazobactam. They are shown to have a lower risk of mortality.    Report Status 07/02/2020 FINAL  Final   Organism ID, Bacteria KLEBSIELLA PNEUMONIAE (A)  Final      Susceptibility   Klebsiella pneumoniae - MIC*    AMPICILLIN >=32 RESISTANT Resistant     CEFAZOLIN >=64 RESISTANT Resistant     CEFEPIME 1 SENSITIVE Sensitive     CEFTRIAXONE 16 RESISTANT Resistant     CIPROFLOXACIN >=4 RESISTANT Resistant     GENTAMICIN 8 INTERMEDIATE Intermediate     IMIPENEM <=0.25 SENSITIVE Sensitive     NITROFURANTOIN 128 RESISTANT Resistant     TRIMETH/SULFA <=20 SENSITIVE Sensitive     AMPICILLIN/SULBACTAM >=32 RESISTANT Resistant     PIP/TAZO 16 SENSITIVE Sensitive     * 10,000 COLONIES/mL KLEBSIELLA PNEUMONIAE  Gastrointestinal Panel by PCR , Stool     Status: None   Collection Time: 07/03/20  8:49 AM   Specimen: Stool  Result Value Ref Range Status   Campylobacter species NOT DETECTED NOT DETECTED Final   Plesimonas shigelloides NOT DETECTED NOT DETECTED Final   Salmonella species NOT DETECTED NOT DETECTED Final   Yersinia enterocolitica NOT DETECTED NOT DETECTED Final   Vibrio species NOT DETECTED NOT DETECTED Final   Vibrio cholerae NOT DETECTED NOT  DETECTED Final   Enteroaggregative E coli (EAEC) NOT DETECTED NOT DETECTED Final   Enteropathogenic E coli (EPEC) NOT DETECTED NOT DETECTED Final   Enterotoxigenic E coli (ETEC) NOT DETECTED NOT DETECTED Final   Shiga like toxin producing E coli (STEC) NOT DETECTED NOT DETECTED Final   Shigella/Enteroinvasive E coli (EIEC) NOT DETECTED NOT DETECTED Final   Cryptosporidium NOT DETECTED NOT DETECTED Final   Cyclospora cayetanensis NOT DETECTED NOT DETECTED Final   Entamoeba histolytica NOT DETECTED NOT DETECTED Final   Giardia lamblia NOT DETECTED NOT DETECTED Final   Adenovirus F40/41 NOT DETECTED NOT DETECTED Final   Astrovirus NOT DETECTED NOT DETECTED Final   Norovirus GI/GII NOT DETECTED NOT DETECTED Final   Rotavirus A NOT DETECTED NOT DETECTED Final   Sapovirus (I, II, IV, and V) NOT DETECTED NOT DETECTED Final    Comment: Performed at [facility], [address], [place], [place] [address]         Radiology Studies: DG Chest 1 View  Result Date: 07/04/2020 CLINICAL DATA:  Altered mental status and respiratory abnormalities. History of hypertension. Former smoker. EXAM: CHEST  1 VIEW COMPARISON:  07/02/2020 FINDINGS: Shallow inspiration. Heart size and pulmonary vascularity are normal for technique. Linear fibrosis or atelectasis in the mid lungs is similar to prior study. No pleural effusions. No pneumothorax. Mediastinal contours appear intact. Calcification of the aorta. Prominent degenerative changes in the shoulders. IMPRESSION: Shallow inspiration with linear fibrosis or atelectasis in the mid lungs. No change. Electronically Signed   By: Burman Nieves M.D.   On: 07/04/2020 19:31        Scheduled Meds: . atorvastatin  20 mg Oral QHS  . fidaxomicin  200 mg Oral BID  . heparin  5,000 Units Subcutaneous Q8H  . magnesium oxide  400 mg Oral BID  . mouth rinse  15 mL Mouth Rinse  BID  . metoprolol tartrate  25 mg Oral BID  . nystatin   Topical BID  .  pantoprazole  40 mg Oral Daily  . potassium chloride  40 mEq Oral Once  . QUEtiapine  200 mg Oral QHS  . sodium chloride flush  10-40 mL Intracatheter Q12H    LOS: 6 days    Time spent: 35 minutes    Mal Asher Hoover Brunette, DO [facility]  If 7PM-7AM, please contact night-coverage [email or web address] 07/05/2020, 10:27 AM

## 2020-07-05 NOTE — Progress Notes (Signed)
Physical Therapy Treatment Patient Details Name: Brenda Hopkins MRN: [id number] DOB: [birth year] Today's Date: 07/05/2020    History of Present Illness Brenda Hopkins  is a 80 y.o. female, with history of hypertension, hyperlipidemia, hiatal hernia, GERD, chronic back pain presents the ED today with a chief complaint of diarrhea, possible UTI, altered mental status, and falls.  Patient was recently discharged from the hospital on April 13.  At that time she was offered SNF placement but refused.  At that hospitalization she presented for altered mental status as well.  It was described as somnolence at that time.  When she became more awake she was pleasantly confused and intermittently agitated.  She had a CT of her brain at that time that showed small focus of cortical hypodensity in the right occipital lobe.  MRI brain did not show any acute findings.  She had acute on chronic CHF at that hospitalization as well with respiratory failure and hypoxia.  She had hematochezia and colitis as well.  It looks like she had a positive C. difficile on April 8 and was started on oral Vanco.  GI stool pathogen panel was negative.  In the hospital she did receive cefepime, Augmentin, metronidazole.  She had a lobar pneumonia and an AKI at that time as well.  Her baseline creatinine was noted to be 0.6-0.9 and was as high as 1.74 during that admission.  She was seen by PT at that hospitalization who recommended physical therapy at skilled nursing facility, but again patient declined.  She returns today confused and with continued diarrhea.  Family reports that she has had 3 falls since being at home, not eating, and family is concerned that they cannot take care of her.  They noted her to have watery brown stool which is consistent with the ED providers exam.  FOBT was positive, although still did not look melanotic or have overt blood.  She did have p.o. skin over her sacrum presumably from diarrhea.  ED notes that  patient was altered at arrival.  She was not responding and just staring at the ceiling.  When her daughter came to bedside she was responsive, and then when her daughter left she became more confused, trying to get out of bed, became irritable and agitated.    PT Comments    Patient presents slightly lethargic with difficulty keeping eyes open and agreeable for therapy.   Patient demonstrates slow labored movement for sitting up at bedside, occasional leaning backwards once seated, requires active assistance to complete most exercises due to weakness and lethargy and limited to a few side steps at bedside before having to sit due to weakness.  Patient tolerated sitting up in chair after therapy - nursing staff aware.  Patient will benefit from continued physical therapy in hospital and recommended venue below to increase strength, balance, endurance for safe ADLs and gait.      Follow Up Recommendations  SNF     Equipment Recommendations  None recommended by PT    Recommendations for Other Services       Precautions / Restrictions Precautions Precautions: Fall Restrictions Weight Bearing Restrictions: No    Mobility  Bed Mobility Overal bed mobility: Needs Assistance Bed Mobility: Supine to Sit     Supine to sit: Min assist;Mod assist     General bed mobility comments: slow labored movement    Transfers Overall transfer level: Needs assistance Equipment used: Rolling walker (2 wheeled) Transfers: Sit to/from [employer] Sit to Stand:  Min assist Stand pivot transfers: Min assist;Mod assist       General transfer comment: unsteady on feet, labored movement  Ambulation/Gait Ambulation/Gait assistance: Min assist;Mod assist Gait Distance (Feet): 5 Feet Assistive device: Rolling walker (2 wheeled) Gait Pattern/deviations: Decreased step length - left;Decreased stance time - right;Decreased stride length;Wide base of support Gait velocity: decreased    General Gait Details: limited to a few slow labored unsteady side steps at bedside due to c/o fatigue and generalized weakness   Stairs             Wheelchair Mobility    Modified Rankin (Stroke Patients Only)       Balance Overall balance assessment: Needs assistance Sitting-balance support: Feet supported;No upper extremity supported Sitting balance-Leahy Scale: Fair Sitting balance - Comments: seated at EOB   Standing balance support: During functional activity;Bilateral upper extremity supported Standing balance-Leahy Scale: Poor Standing balance comment: fair/poor using RW                            Cognition Arousal/Alertness: Awake/alert;Lethargic Behavior During Therapy: WFL for tasks assessed/performed Overall Cognitive Status: Within Functional Limits for tasks assessed                                        Exercises General Exercises - Lower Extremity Ankle Circles/Pumps: Seated;AROM;Strengthening;Both;10 reps Long Arc Quad: Seated;AAROM;Strengthening;Both;10 reps Hip Flexion/Marching: Seated;AAROM;Strengthening;Both;10 reps    General Comments        Pertinent Vitals/Pain Pain Assessment: Faces Faces Pain Scale: Hurts little more Pain Location: generalized pain all over body Pain Descriptors / Indicators: Aching Pain Intervention(s): Limited activity within patient's tolerance;Monitored during session;Repositioned    Home Living                      Prior Function            PT Goals (current goals can now be found in the care plan section) Acute Rehab PT Goals Patient Stated Goal: return home with family to assist after rehab PT Goal Formulation: With patient Time For Goal Achievement: 07/14/20 Potential to Achieve Goals: Good Progress towards PT goals: Progressing toward goals    Frequency    Min 3X/week      PT Plan Current plan remains appropriate    Co-evaluation               AM-PAC PT "6 Clicks" Mobility   Outcome Measure  Help needed turning from your back to your side while in a flat bed without using bedrails?: A Little Help needed moving from lying on your back to sitting on the side of a flat bed without using bedrails?: A Lot Help needed moving to and from a bed to a chair (including a wheelchair)?: A Lot Help needed standing up from a chair using your arms (e.g., wheelchair or bedside chair)?: A Lot Help needed to walk in hospital room?: A Lot Help needed climbing 3-5 steps with a railing? : A Lot 6 Click Score: 13    End of Session   Activity Tolerance: Patient tolerated treatment well;Patient limited by fatigue Patient left: in chair;with call bell/phone within reach;with chair alarm set Nurse Communication: Mobility status PT Visit Diagnosis: Unsteadiness on feet (R26.81);Other abnormalities of gait and mobility (R26.89);Muscle weakness (generalized) (M62.81)     Time: 6389-3734 PT Time Calculation (min) (  ACUTE ONLY): 25 min  Charges:  $Therapeutic Exercise: 8-22 mins $Therapeutic Activity: 8-22 mins                     11:42 AM, 07/05/20 Ocie Bob, MPT Physical Therapist with [facility] [phone] office [phone] mobile phone

## 2020-07-06 DIAGNOSIS — E876 Hypokalemia: Secondary | ICD-10-CM | POA: Diagnosis not present

## 2020-07-06 DIAGNOSIS — I5033 Acute on chronic diastolic (congestive) heart failure: Secondary | ICD-10-CM | POA: Diagnosis not present

## 2020-07-06 DIAGNOSIS — J9601 Acute respiratory failure with hypoxia: Secondary | ICD-10-CM | POA: Diagnosis not present

## 2020-07-06 DIAGNOSIS — M19011 Primary osteoarthritis, right shoulder: Secondary | ICD-10-CM | POA: Diagnosis not present

## 2020-07-06 DIAGNOSIS — Y92129 Unspecified place in nursing home as the place of occurrence of the external cause: Secondary | ICD-10-CM | POA: Diagnosis not present

## 2020-07-06 DIAGNOSIS — G9341 Metabolic encephalopathy: Secondary | ICD-10-CM | POA: Diagnosis not present

## 2020-07-06 DIAGNOSIS — E785 Hyperlipidemia, unspecified: Secondary | ICD-10-CM | POA: Diagnosis not present

## 2020-07-06 DIAGNOSIS — M1909 Primary osteoarthritis, other specified site: Secondary | ICD-10-CM | POA: Diagnosis not present

## 2020-07-06 DIAGNOSIS — M6281 Muscle weakness (generalized): Secondary | ICD-10-CM | POA: Diagnosis not present

## 2020-07-06 DIAGNOSIS — R404 Transient alteration of awareness: Secondary | ICD-10-CM | POA: Diagnosis not present

## 2020-07-06 DIAGNOSIS — M25511 Pain in right shoulder: Secondary | ICD-10-CM | POA: Diagnosis not present

## 2020-07-06 DIAGNOSIS — Z79899 Other long term (current) drug therapy: Secondary | ICD-10-CM | POA: Diagnosis not present

## 2020-07-06 DIAGNOSIS — N390 Urinary tract infection, site not specified: Secondary | ICD-10-CM | POA: Diagnosis not present

## 2020-07-06 DIAGNOSIS — I110 Hypertensive heart disease with heart failure: Secondary | ICD-10-CM | POA: Diagnosis not present

## 2020-07-06 DIAGNOSIS — Z7401 Bed confinement status: Secondary | ICD-10-CM | POA: Diagnosis not present

## 2020-07-06 DIAGNOSIS — R2689 Other abnormalities of gait and mobility: Secondary | ICD-10-CM | POA: Diagnosis not present

## 2020-07-06 DIAGNOSIS — G2581 Restless legs syndrome: Secondary | ICD-10-CM | POA: Diagnosis not present

## 2020-07-06 DIAGNOSIS — R0902 Hypoxemia: Secondary | ICD-10-CM | POA: Diagnosis not present

## 2020-07-06 DIAGNOSIS — Z87891 Personal history of nicotine dependence: Secondary | ICD-10-CM | POA: Diagnosis not present

## 2020-07-06 DIAGNOSIS — R5381 Other malaise: Secondary | ICD-10-CM | POA: Diagnosis not present

## 2020-07-06 DIAGNOSIS — I10 Essential (primary) hypertension: Secondary | ICD-10-CM | POA: Diagnosis not present

## 2020-07-06 DIAGNOSIS — K219 Gastro-esophageal reflux disease without esophagitis: Secondary | ICD-10-CM | POA: Diagnosis not present

## 2020-07-06 DIAGNOSIS — F32A Depression, unspecified: Secondary | ICD-10-CM | POA: Diagnosis not present

## 2020-07-06 DIAGNOSIS — S2231XA Fracture of one rib, right side, initial encounter for closed fracture: Secondary | ICD-10-CM | POA: Diagnosis not present

## 2020-07-06 DIAGNOSIS — R059 Cough, unspecified: Secondary | ICD-10-CM | POA: Diagnosis not present

## 2020-07-06 DIAGNOSIS — I517 Cardiomegaly: Secondary | ICD-10-CM | POA: Diagnosis not present

## 2020-07-06 DIAGNOSIS — K668 Other specified disorders of peritoneum: Secondary | ICD-10-CM | POA: Diagnosis not present

## 2020-07-06 DIAGNOSIS — M25519 Pain in unspecified shoulder: Secondary | ICD-10-CM | POA: Diagnosis not present

## 2020-07-06 DIAGNOSIS — W19XXXA Unspecified fall, initial encounter: Secondary | ICD-10-CM | POA: Diagnosis not present

## 2020-07-06 DIAGNOSIS — S40011A Contusion of right shoulder, initial encounter: Secondary | ICD-10-CM | POA: Diagnosis not present

## 2020-07-06 DIAGNOSIS — R52 Pain, unspecified: Secondary | ICD-10-CM | POA: Diagnosis not present

## 2020-07-06 DIAGNOSIS — R278 Other lack of coordination: Secondary | ICD-10-CM | POA: Diagnosis not present

## 2020-07-06 DIAGNOSIS — J9811 Atelectasis: Secondary | ICD-10-CM | POA: Diagnosis not present

## 2020-07-06 DIAGNOSIS — E8809 Other disorders of plasma-protein metabolism, not elsewhere classified: Secondary | ICD-10-CM | POA: Diagnosis not present

## 2020-07-06 DIAGNOSIS — W15XXXD Fall from cliff, subsequent encounter: Secondary | ICD-10-CM | POA: Diagnosis not present

## 2020-07-06 DIAGNOSIS — M545 Low back pain, unspecified: Secondary | ICD-10-CM | POA: Diagnosis not present

## 2020-07-06 DIAGNOSIS — S299XXA Unspecified injury of thorax, initial encounter: Secondary | ICD-10-CM | POA: Diagnosis present

## 2020-07-06 DIAGNOSIS — Z743 Need for continuous supervision: Secondary | ICD-10-CM | POA: Diagnosis not present

## 2020-07-06 DIAGNOSIS — S43014A Anterior dislocation of right humerus, initial encounter: Secondary | ICD-10-CM | POA: Diagnosis not present

## 2020-07-06 LAB — CBC
HCT: 33.4 % — ABNORMAL LOW (ref 36.0–46.0)
Hemoglobin: 9.7 g/dL — ABNORMAL LOW (ref 12.0–15.0)
MCH: 26.5 pg (ref 26.0–34.0)
MCHC: 29 g/dL — ABNORMAL LOW (ref 30.0–36.0)
MCV: 91.3 fL (ref 80.0–100.0)
Platelets: 340 10*3/uL (ref 150–400)
RBC: 3.66 MIL/uL — ABNORMAL LOW (ref 3.87–5.11)
RDW: 20.9 % — ABNORMAL HIGH (ref 11.5–15.5)
WBC: 6.9 10*3/uL (ref 4.0–10.5)
nRBC: 0 % (ref 0.0–0.2)

## 2020-07-06 LAB — BASIC METABOLIC PANEL
Anion gap: 9 (ref 5–15)
BUN: 7 mg/dL — ABNORMAL LOW (ref 8–23)
CO2: 30 mmol/L (ref 22–32)
Calcium: 8 mg/dL — ABNORMAL LOW (ref 8.9–10.3)
Chloride: 103 mmol/L (ref 98–111)
Creatinine, Ser: 0.57 mg/dL (ref 0.44–1.00)
GFR, Estimated: >60 mL/min (ref >60)
Glucose, Bld: 101 mg/dL — ABNORMAL HIGH (ref 70–99)
Potassium: 3.2 mmol/L — ABNORMAL LOW (ref 3.5–5.1)
Sodium: 142 mmol/L (ref 135–145)

## 2020-07-06 LAB — MAGNESIUM: Magnesium: 1.6 mg/dL — ABNORMAL LOW (ref 1.7–2.4)

## 2020-07-06 MED ORDER — FIDAXOMICIN 200 MG PO TABS: 200.0000 mg | ORAL_TABLET | Freq: Two times a day (BID) | ORAL | 0 refills | Status: AC

## 2020-07-06 MED ORDER — OXYCODONE HCL 15 MG PO TABS: 15.0000 mg | ORAL_TABLET | Freq: Four times a day (QID) | ORAL | 0 refills | Status: DC | PRN

## 2020-07-06 MED ORDER — FAMOTIDINE 10 MG PO TABS: 10.0000 mg | ORAL_TABLET | Freq: Two times a day (BID) | ORAL | 0 refills | Status: DC

## 2020-07-06 MED ORDER — NYSTATIN 100000 UNIT/GM EX POWD: Freq: Two times a day (BID) | CUTANEOUS | 0 refills | Status: AC

## 2020-07-06 NOTE — Discharge Summary (Addendum)
Physician Discharge Summary  Brenda Hopkins WUJ:[id number] DOB: April 26, 1940 DOA: 06/29/2020  PCP: Shawnie Dapper, PA-C  Admit date: 06/29/2020  Discharge date: 07/14/2020  Admitted From:Home  Disposition:  [facility]  Recommendations for Outpatient Follow-up:  1. Follow up with PCP in 1-2 weeks 2. Continue on Dificid as prescribed for 7 more days to complete course of treatment for C. difficile colitis-recurrent 3. Continue other home medications as prior  Home Health: None  Equipment/Devices: None  Discharge Condition:Stable  CODE STATUS: DNR  Diet recommendation: Heart Healthy  Brief/Interim Summary: Brenda Hopkins a80 y.o.female,with history of hypertension, hyperlipidemia, hiatal hernia, GERD, chronic back pain presents the ED today with a chief complaint of diarrhea, possible UTI, altered mental status, and falls. Patient was recently discharged from the hospital on April 13. At that time she was offered SNF placement but refused.Patient was admitted with acute encephalopathy with SIRS criteria in the setting of recent C. difficile colitis.  She was noted to have acute metabolic encephalopathy secondary to sepsis with Klebsiella pneumoniae UTI and has completed course of treatment for the last 5 days with cefepime. She was also noted to have pneumoperitoneum as well as AKI.  She was evaluated by general surgery with no need for operative intervention noted and she was able to tolerate diet.  Her AKI has now resolved and she appears in stable condition for discharge.  She does not have any significant bowel movements noted since she has been started on Dificid.  She did have altered mentation and agitation overnight, but this has improved after use of Seroquel at bedtime.  This was initially held on account of her confusion.  No other acute events noted throughout the course of this admission and she is stable for discharge.  Discharge Diagnoses:  Active Problems:    AKI (acute kidney injury) (HCC)   Hypoalbuminemia   Acute metabolic encephalopathy   Hypokalemia   Falls   Pneumoperitoneum  Principal discharge diagnosis: Acute metabolic encephalopathy secondary to sepsis related to Klebsiella pneumonia UTI.  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  Discharge Instructions  Discharge Instructions    Diet - low sodium heart healthy   Complete by: As directed    Increase activity slowly   Complete by: As directed      Allergies as of 07/06/2020      Reactions   Prozac [fluoxetine Hcl] Other (See Comments)   Makes her fell crazy   Zoloft [sertraline Hcl]    Made her feel crazy--10/2013   Nicoderm [nicotine] Rash   Site rash      Medication List    STOP taking these medications   pantoprazole 40 MG tablet Commonly known as: Protonix   vancomycin 50 mg/mL  oral solution Commonly known as: VANCOCIN     TAKE these medications   amLODipine 10 MG tablet Commonly known as: NORVASC Take 0.5 tablets (5 mg total) by mouth daily.   atorvastatin 20 MG tablet Commonly known as: LIPITOR TAKE 1 TABLET BY MOUTH EVERY NIGHT AT BEDTIME   famotidine 10 MG tablet Commonly known as: PEPCID Take 1 tablet (10 mg total) by mouth 2 (two) times daily.   furosemide 20 MG tablet Commonly known as: LASIX Take 1 tablet (20 mg total) by mouth daily.   gabapentin 600 MG tablet Commonly known as: NEURONTIN Take 1 tablet (600 mg total) by mouth 2 (two) times daily.   metoprolol tartrate 25 MG tablet Commonly known as: LOPRESSOR Take 1 tablet (25 mg  total) by mouth 2 (two) times daily.   Niferex Tabs Take 1 tablet by mouth daily.   nystatin powder Commonly known as: MYCOSTATIN/NYSTOP Apply topically 2 (two) times daily.   oxyCODONE 15 MG immediate release tablet Commonly known as: ROXICODONE Take 1 tablet (15 mg total) by mouth every 6 (six) hours as needed for severe pain or breakthrough pain. What  changed:   when to take this  reasons to take this   potassium chloride SA 20 MEQ tablet Commonly known as: KLOR-CON Take 20 mEq by mouth 2 (two) times daily.   QUEtiapine 200 MG tablet Commonly known as: SEROQUEL Take 200 mg by mouth at bedtime.   rOPINIRole 3 MG tablet Commonly known as: REQUIP Take 3 mg by mouth at bedtime.   sertraline 25 MG tablet Commonly known as: ZOLOFT Take 25 mg by mouth daily.   sucralfate 1 g tablet Commonly known as: CARAFATE Take 1 tablet (1 g total) by mouth 2 (two) times daily.     ASK your doctor about these medications   fidaxomicin 200 MG Tabs tablet Commonly known as: DIFICID Take 1 tablet (200 mg total) by mouth 2 (two) times daily for 7 days. Ask about: Should I take this medication?       Contact information for follow-up providers    Shawnie Dapper, PA-C. Schedule an appointment as soon as possible for a visit in 1 week(s).   Specialties: Physician Assistant, Internal Medicine Contact information: [address] [place] [address] [phone]            Contact information for after-discharge care    Destination    [facility] .   Service: Skilled Nursing Contact information: [address] [place] [address] [phone]                 Allergies  Allergen Reactions  . Prozac [Fluoxetine Hcl] Other (See Comments)    Makes her fell crazy  . Zoloft [Sertraline Hcl]     Made her feel crazy--10/2013  . Nicoderm [Nicotine] Rash    Site rash    Consultations:  None   Procedures/Studies: CT ABDOMEN PELVIS WO CONTRAST  Result Date: 06/30/2020 CLINICAL DATA:  80 year old female with history of hematochezia. Suspected acute diverticulitis. EXAM: CT ABDOMEN AND PELVIS WITHOUT CONTRAST TECHNIQUE: Multidetector CT imaging of the abdomen and pelvis was performed following the standard protocol without IV contrast. COMPARISON:  CT the abdomen  and pelvis 06/19/2020. FINDINGS: Lower chest: Atherosclerotic calcifications in the descending thoracic aorta as well as the left main, left anterior descending, left circumflex and right coronary arteries. Calcifications of the mitral annulus and mitral valve. Hepatobiliary: No definite suspicious cystic or solid hepatic lesions are confidently identified on today's noncontrast CT examination. Unenhanced appearance of the gallbladder is normal. Pancreas: No definite pancreatic mass or peripancreatic fluid collections or inflammatory changes. Spleen: Unremarkable. Adrenals/Urinary Tract: Calcification in the left renal hilum appears to be vascular. No definite suspicious renal lesions. Unenhanced appearance of the kidneys and bilateral adrenal glands is normal. No hydroureteronephrosis. Urinary bladder is normal in appearance. Stomach/Bowel: Postoperative changes of Roux-en-Y gastric bypass. No pathologic dilatation of small bowel or colon. Numerous colonic diverticulae are noted, without surrounding inflammatory changes to clearly indicate an acute diverticulitis at this time. The appendix is not confidently identified and may be surgically absent. Regardless, there are no inflammatory changes noted adjacent to the cecum to suggest the presence of an acute appendicitis at this  time. Vascular/Lymphatic: Aortic atherosclerosis. No lymphadenopathy noted in the abdomen or pelvis. Reproductive: Status post hysterectomy. Ovaries are not confidently identified may be surgically absent or atrophic. Other: Small volume of pneumoperitoneum evident beneath both of the diaphragms, and within the patient's ventral hernia which otherwise contains predominantly fat. No significant volume of ascites. Musculoskeletal: Old healed fractures of the right superior and inferior pubic rami with posttraumatic deformity. Status post PLIF at L5-S1 with 1.3 cm of anterolisthesis of L5 upon S1. Chronic appearing compression fracture of T9  with complete loss of anterior vertebral body height. IMPRESSION: 1. Today's study is positive for pneumoperitoneum. The source of pneumoperitoneum is not readily apparent on today's examination, but the presence of pneumoperitoneum raises concern for potential bowel perforation. 2. Colonic diverticulosis without definitive findings to suggest an acute diverticulitis at this time. 3. Aortic atherosclerosis, in addition to left main and 3 vessel coronary artery disease. Assessment for potential risk factor modification, dietary therapy or pharmacologic therapy may be warranted, if clinically indicated. 4. There are calcifications of the mitral annulus/valve. Echocardiographic correlation for evaluation of potential valvular dysfunction may be warranted if clinically indicated. 5. Additional postoperative changes and incidental finding, as above. Critical Value/emergent results were called by telephone at the time of interpretation on 06/30/2020 at 2:43 pm to provider Dr. Maurilio LovelyPratik Rafi Kenneth, who verbally acknowledged these results. Electronically Signed   By: Trudie Reedaniel  Entrikin M.D.   On: 06/30/2020 14:43   CT ABDOMEN PELVIS WO CONTRAST  Result Date: 06/19/2020 CLINICAL DATA:  Follow-up abnormal chest radiograph. Shortness of breath for 1 week. EXAM: CT CHEST, ABDOMEN AND PELVIS WITHOUT CONTRAST TECHNIQUE: Multidetector CT imaging of the chest, abdomen and pelvis was performed following the standard protocol without IV contrast. COMPARISON:  CT AP 01/30/2020 and CTA chest 06/12/2020 FINDINGS: CT CHEST FINDINGS Cardiovascular: Heart size appears mildly enlarged. Aortic atherosclerosis. Coronary artery calcifications. No pericardial effusion. Mediastinum/Nodes: No mediastinal, supraclavicular or axillary adenopathy. There is an apparent small air-filled tract arising off the left lateral wall of the esophagus at the level of the transverse aortic arch chest just proximal to the left mainstem bronchus. This appears to contain a  small volume of gas and debris, image 16/2. No significant pneumo mediastinum identified. This finding may reflect collapsed esophagus with lateral deviation versus esophageal perforation status post EGD. The trachea appears patent and is midline. Normal appearance of the thyroid gland. Lungs/Pleura: Small bilateral pleural effusions are identified, new from previous exam. Subsegmental atelectasis is noted within the posteromedial right lower lobe, lingula and left lower lobe. No pneumothorax identified. Mild changes of centrilobular emphysema with diffuse bronchial wall thickening. Musculoskeletal: Multiple bilateral rib deformities are identified, likely posttraumatic. Unchanged. 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 Similar appearance of vertebra plana deformity at the T9 level. CT ABDOMEN PELVIS FINDINGS Hepatobiliary: No focal liver abnormality. Gallbladder appears diffusely distended. No signs of gallbladder wall thickening or inflammation. No bile duct dilatation Pancreas: Unremarkable. No pancreatic ductal dilatation or surrounding inflammatory changes. Spleen: Normal in size without focal abnormality. Adrenals/Urinary Tract: Normal appearance of the adrenal glands. No kidney mass or hydronephrosis. Urinary bladder is unremarkable. Stomach/Bowel: Hiatal hernia. The appendix is not confidently identified separate from the right lower quadrant bowel loops. No small bowel wall thickening, inflammation, or distension. A rectal tube is in place. Within the limitations of unenhanced technique there is wall thickening involving the distal sigmoid colon and rectum with diffuse perirectal soft tissue stranding and edema. This appears new from 01/30/2020. Vascular/Lymphatic:  Aortic atherosclerosis. No aneurysm. No abdominopelvic adenopathy identified. Reproductive: Status post hysterectomy. No adnexal masses. Other: No focal fluid collections identified. No signs of  pneumoperitoneum. Midline ventral abdominal wall hernia is identified which contains fat only. Musculoskeletal: Previous L5-S1 PLIF with posterior translation of the interbody fusion cage with compression upon the spinal canal. This is unchanged from 01/30/2020. Grade 2 anterolisthesis is identified at this level, unchanged. Chronic fracture deformities involving the right superior and inferior pubic rami noted. IMPRESSION: 1. There is apparent air-filled tract arising off the left lateral wall of the esophagus at the level of the transverse aortic arch and left mainstem bronchus. Although this may reflect lateral deviation of collapsed esophagus perforation cannot be excluded. If there is a clinical concern for esophageal perforation further evaluation with water-soluble esophagram or repeat CT of the chest following ingestion of water-soluble contrast material is advised. 2. Small bilateral pleural effusions with multifocal areas of lower lung zone subsegmental atelectasis 3. Wall thickening involving the rectosigmoid colon is identified with diffuse perirectal soft tissue stranding/edema. Correlate for any clinical signs or symptoms of inflammatory or proctitis. 4. Aortic Atherosclerosis (ICD10-I70.0) and Emphysema (ICD10-J43.9). 5. Coronary artery calcifications. 6. 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 Chronic bilateral rib fractures and chronic fractures involving the superior and inferior right pubic rami noted. Electronically Signed   By: Signa Kell M.D.   On: 06/19/2020 12:12   DG Chest 1 View  Result Date: 07/04/2020 CLINICAL DATA:  Altered mental status and respiratory abnormalities. History of hypertension. Former smoker. EXAM: CHEST  1 VIEW COMPARISON:  07/02/2020 FINDINGS: Shallow inspiration. Heart size and pulmonary vascularity are normal for technique. Linear fibrosis or atelectasis in the mid lungs is similar to prior study. No pleural effusions. No pneumothorax.  Mediastinal contours appear intact. Calcification of the aorta. Prominent degenerative changes in the shoulders. IMPRESSION: Shallow inspiration with linear fibrosis or atelectasis in the mid lungs. No change. Electronically Signed   By: Burman Nieves M.D.   On: 07/04/2020 19:31   DG Chest 1 View  Result Date: 07/02/2020 CLINICAL DATA:  Chest pain. EXAM: CHEST  1 VIEW COMPARISON:  June 29, 2020. FINDINGS: Stable cardiomediastinal silhouette. No pneumothorax or pleural effusion is noted. Minimal left midlung subsegmental atelectasis or scarring is noted. Old bilateral rib fractures are noted. IMPRESSION: Minimal left midlung subsegmental atelectasis or scarring. Aortic Atherosclerosis (ICD10-I70.0). Electronically Signed   By: Lupita Raider M.D.   On: 07/02/2020 14:21   DG Shoulder Right  Result Date: 07/07/2020 CLINICAL DATA:  80 year old female with fall and right shoulder pain. EXAM: RIGHT SHOULDER - 2+ VIEW COMPARISON:  Right shoulder radiograph dated 02/10/2014 FINDINGS: Evaluation is limited as the axillary view is not provided. No definite acute fracture of the shoulder. Evaluation for fracture is limited due to advanced osteopenia. There is no dislocation. There is degenerative changes of the right shoulder and AC joint. There is narrowing of the acromial humeral distance due to degenerative changes and spurring. Age indeterminate fracture of the posterior right eighth rib. Correlation with point tenderness recommended. The soft tissues are unremarkable. IMPRESSION: 1. No definite acute fracture or dislocation of the right shoulder. 2. Age indeterminate fracture of the posterior right eighth rib. Correlation with point tenderness recommended. Electronically Signed   By: Elgie Collard M.D.   On: 07/07/2020 20:17   DG Abd 1 View  Result Date: 07/02/2020 CLINICAL DATA:  Nausea, vomiting. EXAM: ABDOMEN - 1 VIEW COMPARISON:  June 19, 2020. FINDINGS: The bowel gas  pattern is normal. No  radio-opaque calculi or other significant radiographic abnormality are seen. IMPRESSION: Negative. Electronically Signed   By: Lupita Raider M.D.   On: 07/02/2020 14:22   CT Head Wo Contrast  Result Date: 06/29/2020 CLINICAL DATA:  Fall altered EXAM: CT HEAD WITHOUT CONTRAST TECHNIQUE: Contiguous axial images were obtained from the base of the skull through the vertex without intravenous contrast. COMPARISON:  MRI 06/19/2020, CT brain 06/18/2020 FINDINGS: Brain: No acute territorial infarction, hemorrhage or intracranial mass. Mild atrophy. Moderate white matter hypodensity consistent with chronic small vessel ischemic change. Chronic lacunar infarcts within the bilateral basal ganglia. Stable ventricle size Vascular: No hyperdense vessels.  Carotid vascular calcification Skull: Normal. Negative for fracture or focal lesion. Sinuses/Orbits: No acute finding. Other: None IMPRESSION: 1. No CT evidence for acute intracranial abnormality. 2. Atrophy and chronic small vessel ischemic changes of the white matter. Electronically Signed   By: Jasmine Pang M.D.   On: 06/29/2020 20:38   CT HEAD WO CONTRAST  Result Date: 06/18/2020 CLINICAL DATA:  Inpatient. Mental status change after EGD 06/17/2020. Restless. No reported injury. EXAM: CT HEAD WITHOUT CONTRAST TECHNIQUE: Contiguous axial images were obtained from the base of the skull through the vertex without intravenous contrast. COMPARISON:  None. FINDINGS: Brain: Small focus of cortical hypodensity in right occipital lobe (series 2/image 11). Nonspecific moderate subcortical and periventricular white matter hypodensity, most in keeping with chronic small vessel ischemic change. No evidence of parenchymal hemorrhage or extra-axial fluid collection. No mass lesion, mass effect, or midline shift. Cerebral volume is age appropriate. No ventriculomegaly. Vascular: No acute abnormality. Skull: No evidence of calvarial fracture. Sinuses/Orbits: The visualized paranasal  sinuses are essentially clear. Other:  The mastoid air cells are unopacified. IMPRESSION: 1. Small focus of cortical hypodensity in the right occipital lobe, cannot exclude acute/subacute infarct. No acute intracranial hemorrhage. MRI brain without and with IV contrast could be considered for further evaluation as clinically warranted. 2. Moderate chronic small vessel ischemic changes in the cerebral white matter. These results will be called to the ordering clinician or representative by the Radiologist Assistant, and communication documented in the PACS or [employer]. Electronically Signed   By: Delbert Phenix M.D.   On: 06/18/2020 17:53   CT CHEST WO CONTRAST  Result Date: 06/19/2020 CLINICAL DATA:  Follow-up abnormal chest radiograph. Shortness of breath for 1 week. EXAM: CT CHEST, ABDOMEN AND PELVIS WITHOUT CONTRAST TECHNIQUE: Multidetector CT imaging of the chest, abdomen and pelvis was performed following the standard protocol without IV contrast. COMPARISON:  CT AP 01/30/2020 and CTA chest 06/12/2020 FINDINGS: CT CHEST FINDINGS Cardiovascular: Heart size appears mildly enlarged. Aortic atherosclerosis. Coronary artery calcifications. No pericardial effusion. Mediastinum/Nodes: No mediastinal, supraclavicular or axillary adenopathy. There is an apparent small air-filled tract arising off the left lateral wall of the esophagus at the level of the transverse aortic arch chest just proximal to the left mainstem bronchus. This appears to contain a small volume of gas and debris, image 16/2. No significant pneumo mediastinum identified. This finding may reflect collapsed esophagus with lateral deviation versus esophageal perforation status post EGD. The trachea appears patent and is midline. Normal appearance of the thyroid gland. Lungs/Pleura: Small bilateral pleural effusions are identified, new from previous exam. Subsegmental atelectasis is noted within the posteromedial right lower lobe, lingula and  left lower lobe. No pneumothorax identified. Mild changes of centrilobular emphysema with diffuse bronchial wall thickening. Musculoskeletal: Multiple bilateral rib deformities are identified, likely posttraumatic. Unchanged. Nonunion deformity involving  the distal body of sternum is again identified. Similar appearance of vertebra plana deformity at the T9 level. CT ABDOMEN PELVIS FINDINGS Hepatobiliary: No focal liver abnormality. Gallbladder appears diffusely distended. No signs of gallbladder wall thickening or inflammation. No bile duct dilatation Pancreas: Unremarkable. No pancreatic ductal dilatation or surrounding inflammatory changes. Spleen: Normal in size without focal abnormality. Adrenals/Urinary Tract: Normal appearance of the adrenal glands. No kidney mass or hydronephrosis. Urinary bladder is unremarkable. Stomach/Bowel: Hiatal hernia. The appendix is not confidently identified separate from the right lower quadrant bowel loops. No small bowel wall thickening, inflammation, or distension. A rectal tube is in place. Within the limitations of unenhanced technique there is wall thickening involving the distal sigmoid colon and rectum with diffuse perirectal soft tissue stranding and edema. This appears new from 01/30/2020. Vascular/Lymphatic: Aortic atherosclerosis. No aneurysm. No abdominopelvic adenopathy identified. Reproductive: Status post hysterectomy. No adnexal masses. Other: No focal fluid collections identified. No signs of pneumoperitoneum. Midline ventral abdominal wall hernia is identified which contains fat only. Musculoskeletal: Previous L5-S1 PLIF with posterior translation of the interbody fusion cage with compression upon the spinal canal. This is unchanged from 01/30/2020. Grade 2 anterolisthesis is identified at this level, unchanged. Chronic fracture deformities involving the right superior and inferior pubic rami noted. IMPRESSION: 1. There is apparent air-filled tract arising off  the left lateral wall of the esophagus at the level of the transverse aortic arch and left mainstem bronchus. Although this may reflect lateral deviation of collapsed esophagus perforation cannot be excluded. If there is a clinical concern for esophageal perforation further evaluation with water-soluble esophagram or repeat CT of the chest following ingestion of water-soluble contrast material is advised. 2. Small bilateral pleural effusions with multifocal areas of lower lung zone subsegmental atelectasis 3. Wall thickening involving the rectosigmoid colon is identified with diffuse perirectal soft tissue stranding/edema. Correlate for any clinical signs or symptoms of inflammatory or proctitis. 4. Aortic Atherosclerosis (ICD10-I70.0) and Emphysema (ICD10-J43.9). 5. Coronary artery calcifications. 6. 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 Chronic bilateral rib fractures and chronic fractures involving the superior and inferior right pubic rami noted. Electronically Signed   By: Signa Kell M.D.   On: 06/19/2020 12:12   CT CHEST W CONTRAST  Result Date: 06/20/2020 CLINICAL DATA:  EGD yesterday. Unenhanced CT at that time demonstrated what is likely a diverticulum involving the distal esophagus. The CT examination is repeated with IV and oral contrast today. EXAM: CT CHEST WITH CONTRAST TECHNIQUE: Multidetector CT imaging of the chest was performed during intravenous contrast administration. Oral contrast was also administered in order to evaluate the esophagus. CONTRAST:  75mL OMNIPAQUE IOHEXOL 300 MG/ML IV. COMPARISON:  Unenhanced CT chest yesterday. FINDINGS: Beam hardening streak artifact related to the fact that the patient was unable to raise the arms makes the examination less than optimal. Cardiovascular: Heart moderately enlarged. Mitral annular and aortic annular calcifications. Severe three-vessel coronary atherosclerosis. No pericardial effusion. Severe atherosclerosis  involving the thoracic and upper abdominal aorta without evidence of aneurysm. Mediastinum/Nodes: Oral contrast within the esophagus. Fluid-filled structure measuring approximately 2 cm adjacent to the distal esophagus; there is no oral contrast within this fluid-filled structure. Remainder of the esophagus mildly distended but otherwise normal in appearance. No pathologic lymphadenopathy. Multiple small thyroid nodules in an upper normal sized to slightly enlarged gland, the largest measuring approximately 8 mm. Lungs/Pleura: Atelectasis involving the lower lobes bilaterally. More confluent airspace consolidation is present deep in the RIGHT LOWER LOBE. Mild ground-glass airspace opacity  in the lingula. Lungs otherwise clear. No parenchymal nodules or masses. No pleural effusions. Central airways patent without significant bronchial wall thickening. Upper Abdomen: Chronic elevation RIGHT hemidiaphragm. 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 No acute findings. Musculoskeletal: Remote severe compression fracture of T10 with an associated kyphous deformity. Lucent lesion involving the body of the sternum with sclerotic margins and extraosseous extension and/or pathologic fracture. Severe degenerative changes in the shoulder joints. IMPRESSION: 1. 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 There is no oral contrast within this structure. 2. Atelectasis involving the lower lobes bilaterally. More confluent airspace consolidation deep in the RIGHT LOWER LOBE, query superimposed pneumonia. Mild ground-glass airspace opacity in the lingula is also felt to be infectious or inflammatory. 3. Lucent lesion involving the body of the sternum with extraosseous extension and/or pathologic fracture. 4. Moderate cardiomegaly. Severe three-vessel coronary atherosclerosis. 5. Multinodular goiter. The largest nodule is 8 mm.  Not clinically significant; no follow-up imaging recommended. (Ref: J Am Coll Radiol. 2015 Feb;12(2): 143-50). 6. Aortic Atherosclerosis (ICD10-I70.0). Electronically Signed   By: Hulan Saas M.D.   On: 06/20/2020 14:26   MR BRAIN WO CONTRAST  Result Date: 06/19/2020 CLINICAL DATA:  Mental status change. EXAM: MRI HEAD WITHOUT CONTRAST TECHNIQUE: Multiplanar, multiecho pulse sequences of the brain and surrounding structures were obtained without intravenous contrast. COMPARISON:  Head CT June 18, 2020 FINDINGS: The study is partially degraded by motion. Brain: No acute infarction, hemorrhage, hydrocephalus, extra-axial collection or mass lesion. Scattered and confluent foci of T2 hyperintensity are seen within the white matter of the cerebral hemispheres and within the pons, nonspecific, most likely related to chronic small vessel ischemia. 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 Hypodense area described on prior CT corresponds to prominent arachnoid granulation in the right transverse sinus. Prominent vascular susceptibility artifact on the GRE sequence is related to recent administration of ferumoxytol seizure. Vascular: Normal flow voids. Skull and upper cervical spine: Normal marrow signal. Sinuses/Orbits: Negative. Other: None. IMPRESSION: 1. No acute intracranial abnormality. 2. 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 3. Moderate chronic small vessel ischemia. Electronically Signed   By: Baldemar Lenis M.D.   On: 06/19/2020 09:52   DG Chest Port 1 View  Result Date: 06/29/2020 CLINICAL DATA:  Code sepsis altered week EXAM: PORTABLE CHEST 1 VIEW COMPARISON:  06/19/2020 FINDINGS: Low lung volumes. Atelectasis or scarring in the left lower lung. No focal consolidation or effusion. Stable enlarged cardiomediastinal silhouette with aortic atherosclerosis. No pneumothorax. Chronic bilateral shoulder deformities.  IMPRESSION: Low lung volumes with atelectasis or scarring in the left lower lung. Cardiomegaly Electronically Signed   By: Jasmine Pang M.D.   On: 06/29/2020 18:17   DG Chest Port 1 View  Result Date: 06/19/2020 CLINICAL DATA:  Central line placement. EXAM: PORTABLE CHEST 1 VIEW COMPARISON:  April 09/30/2020. FINDINGS: Low lung volumes with bibasilar opacities. No visible pleural effusions or pneumothorax on this portable semi rib upright radiograph. Mild enlargement the cardiac silhouette. Calcific atherosclerosis of the aorta. Right IJ central venous catheter with the tip projecting at the superior cavoatrial junction. Polyarticular degenerative change. IMPRESSION: 1. Right IJ central venous catheter with the tip projecting at the superior cavoatrial junction. 2. Low lung volumes with bibasilar opacities that are better characterized on same day CT chest. Electronically Signed   By: Feliberto Harts MD   On: 06/19/2020 11:15  DG CHEST PORT 1 VIEW  Result Date: 06/18/2020 CLINICAL DATA:  Shortness of breath, hypoxia, history hypertension, smoker EXAM: PORTABLE CHEST 1 VIEW COMPARISON:  Portable exam 1043 hours compared to 06/15/2020 FINDINGS: Upper normal heart size. Mediastinal contours and pulmonary vascularity normal. Atherosclerotic calcification aorta. Bronchitic changes with bibasilar atelectasis. Remaining lungs clear. No pleural effusion or pneumothorax. Bones demineralized with advanced RIGHT glenohumeral degenerative changes. IMPRESSION: Persistent bronchitic changes and bibasilar atelectasis. Aortic Atherosclerosis (ICD10-I70.0). Electronically Signed   By: Ulyses Southward M.D.   On: 06/18/2020 11:13   DG CHEST PORT 1 VIEW  Result Date: 06/15/2020 CLINICAL DATA:  Shortness of breath, hypertension, former smoker EXAM: PORTABLE CHEST 1 VIEW COMPARISON:  Portable exam 0656 hours compared to 06/12/2020 FINDINGS: Enlargement of cardiac silhouette. Mediastinal contours and pulmonary vascularity normal.  Atherosclerotic calcification aorta. Bibasilar atelectasis. No acute infiltrate, pleural effusion, or pneumothorax. Bones demineralized with advanced degenerative changes of the RIGHT glenohumeral joint. IMPRESSION: Enlargement of cardiac silhouette with bibasilar atelectasis. Aortic Atherosclerosis (ICD10-I70.0). Electronically Signed   By: Ulyses Southward M.D.   On: 06/15/2020 08:06   DG Abd 2 Views  Result Date: 06/19/2020 CLINICAL DATA:  Abdominal pain and altered mental status. EXAM: ABDOMEN - 2 VIEW COMPARISON:  CT abdomen and pelvis 01/30/2020 FINDINGS: Scattered gas and stool in the colon. No definite small or large bowel dilatation although paucity of gas limits evaluation. No free intra-abdominal air. No abnormal air-fluid levels. No radiopaque stones. There is evidence of infiltration or atelectasis in both lung bases. Postoperative changes in the lumbosacral spine. Degenerative changes in the lumbar spine and hips. IMPRESSION: 1. Nonobstructive bowel gas pattern. 2. Infiltration or atelectasis in both lung bases. Electronically Signed   By: Burman Nieves M.D.   On: 06/19/2020 02:10   US Abdomen Limited RUQ (LIVER/GB)  Result Date: 06/15/2020 CLINICAL DATA:  Diffuse abdominal pain. 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 EXAM: ULTRASOUND ABDOMEN LIMITED RIGHT UPPER QUADRANT COMPARISON:  Prior CT scan November 2021 FINDINGS: Gallbladder: No gallstones or wall thickening visualized. No sonographic Murphy sign noted by sonographer. Common bile duct: Diameter: Within normal limits at 5-6 mm Liver: No focal lesion identified. Within normal limits in parenchymal echogenicity. Portal vein is patent on color Doppler imaging with normal direction of blood flow towards the liver. Other: None. IMPRESSION: Negative right upper quadrant ultrasound. Electronically Signed   By: Malachy Moan M.D.   On: 06/15/2020 09:55     Discharge Exam: Vitals:   07/06/20 0954 07/06/20  1416  BP: (!) 162/90 (!) 156/89  Pulse: 77 63  Resp:  18  Temp:  98.2 F (36.8 C)  SpO2:  94%   Vitals:   07/05/20 2047 07/06/20 0618 07/06/20 0954 07/06/20 1416  BP: (!) 173/82 (!) 156/97 (!) 162/90 (!) 156/89  Pulse: 76 80 77 63  Resp: Temp: (!) 97.3 F (36.3 C) (!) 97.3 F (36.3 C)  98.2 F (36.8 C)  TempSrc:  Oral  Oral  SpO2: 94% 93%  94%  Weight:      Height:        General: Pt is alert, awake, not in acute distress Cardiovascular: RRR, S1/S2 +, no rubs, no gallops Respiratory: CTA bilaterally, no wheezing, no rhonchi Abdominal: Soft, NT, ND, bowel sounds + Extremities: no edema, no cyanosis    The results of significant diagnostics from this hospitalization (including imaging, microbiology, ancillary and laboratory) are listed below for reference.     Microbiology: No  results found for this or any previous visit (from the past 240 hour(s)).   Labs: BNP (last 3 results) Recent Labs    06/15/20 0441 06/16/20 0454 06/17/20 0648  BNP 282.0* 198.0* 167.0*   Basic Metabolic Panel: No results for input(s): NA, K, CL, CO2, GLUCOSE, BUN, CREATININE, CALCIUM, MG, PHOS in the last 168 hours. Liver Function Tests: No results for input(s): AST, ALT, ALKPHOS, BILITOT, PROT, ALBUMIN in the last 168 hours. No results for input(s): LIPASE, AMYLASE in the last 168 hours. No results for input(s): AMMONIA in the last 168 hours. CBC: No results for input(s): WBC, NEUTROABS, HGB, HCT, MCV, PLT in the last 168 hours. Cardiac Enzymes: No results for input(s): CKTOTAL, CKMB, CKMBINDEX, TROPONINI in the last 168 hours. BNP: Invalid input(s): POCBNP CBG: No results for input(s): GLUCAP in the last 168 hours. D-Dimer No results for input(s): DDIMER in the last 72 hours. Hgb A1c No results for input(s): HGBA1C in the last 72 hours. Lipid Profile No results for input(s): CHOL, HDL, LDLCALC, TRIG, CHOLHDL, LDLDIRECT in the last 72 hours. Thyroid function  studies No results for input(s): TSH, T4TOTAL, T3FREE, THYROIDAB in the last 72 hours.  Invalid input(s): FREET3 Anemia work up No results for input(s): VITAMINB12, FOLATE, FERRITIN, TIBC, IRON, RETICCTPCT in the last 72 hours. Urinalysis    Component Value Date/Time   COLORURINE YELLOW 06/30/2020 0129   APPEARANCEUR HAZY (A) 06/30/2020 0129   LABSPEC 1.013 06/30/2020 0129   PHURINE 5.0 06/30/2020 0129   GLUCOSEU NEGATIVE 06/30/2020 0129   HGBUR MODERATE (A) 06/30/2020 0129   BILIRUBINUR NEGATIVE 06/30/2020 0129   KETONESUR NEGATIVE 06/30/2020 0129   PROTEINUR NEGATIVE 06/30/2020 0129   NITRITE NEGATIVE 06/30/2020 0129   LEUKOCYTESUR LARGE (A) 06/30/2020 0129   Sepsis Labs Invalid input(s): PROCALCITONIN,  WBC,  LACTICIDVEN Microbiology No results found for this or any previous visit (from the past 240 hour(s)).   Time coordinating discharge: 35 minutes  SIGNED:   Erick Blinks, DO [facility] 07/14/2020, 3:42 PM  If 7PM-7AM, please contact night-coverage [email or web address]

## 2020-07-06 NOTE — TOC Transition Note (Signed)
Transition of Care [facility]) - CM/SW Discharge Note   Patient Details  Name: Brenda Hopkins MRN: [id number] Date of Birth: 09-30-1940  [facility] CM/SW Contact:  Elliot Gault, LCSW Phone Number: 07/06/2020, 11:39 AM   Clinical Narrative:     Pt stable for dc today per MD. Updated pt's daughter, Porfirio Mylar, who remains in agreement with dc plan to [facility] for SNF rehab.   DC clinical sent electronically. RN to call report. EMS arranged.  There are no other TOC needs for dc.  Final next level of care: Acute to Acute Transfer Barriers to Discharge: Barriers Resolved   Patient Goals and CMS Choice Patient states their goals for this hospitalization and ongoing recovery are:: to go to rehab. CMS Medicare.gov Compare Post Acute Care list provided to:: Patient Represenative (must comment) Choice offered to / list presented to : Adult Children  Discharge Placement              Patient chooses bed at: Other - please specify in the comment section below: ([facility]) Patient to be transferred to facility by: EMS Name of family member notified: Porfirio Mylar Patient and family notified of of transfer: 07/06/20  Discharge Plan and Services                                     Social Determinants of Health (SDOH) Interventions     Readmission Risk Interventions Readmission Risk Prevention Plan 06/30/2020  Transportation Screening Complete  HRI or Home Care Consult Complete  SW Recovery Care/Counseling Consult Complete  Palliative Care Screening Not Applicable  Skilled Nursing Facility Complete  Some recent data might be hidden

## 2020-07-06 NOTE — Progress Notes (Signed)
Patient being discharged to Skilled Nursing facility [facility]. Report called to receiving nurse at [facility]. EMS to transport. Patient aware of discharge to Skilled Nursing Facility. Attempted to call daughter to update on discharge no answer from daughter at this time. Social worker will set up EMS transport. Discharge summary, DNR form and prescriptions placed in discharge packet for receiving facility.

## 2020-07-06 NOTE — Care Management Important Message (Signed)
Important Message  Patient Details  Name: Brenda Hopkins MRN: [id number] Date of Birth: 23-Feb-1941   Medicare Important Message Given:  Yes     Corey Harold 07/06/2020, 12:24 PM

## 2020-07-07 ENCOUNTER — Encounter (HOSPITAL_COMMUNITY): Payer: Self-pay | Admitting: Emergency Medicine

## 2020-07-07 ENCOUNTER — Emergency Department (HOSPITAL_COMMUNITY): Payer: Medicare Other

## 2020-07-07 ENCOUNTER — Other Ambulatory Visit: Payer: Self-pay | Admitting: *Deleted

## 2020-07-07 ENCOUNTER — Other Ambulatory Visit: Payer: Self-pay

## 2020-07-07 ENCOUNTER — Emergency Department (HOSPITAL_COMMUNITY)
Admission: EM | Admit: 2020-07-07 | Discharge: 2020-07-07 | Disposition: A | Discharge status: Skilled Nursing Facility | Payer: Medicare Other | Attending: Emergency Medicine | Admitting: Emergency Medicine

## 2020-07-07 DIAGNOSIS — Y92129 Unspecified place in nursing home as the place of occurrence of the external cause: Secondary | ICD-10-CM | POA: Insufficient documentation

## 2020-07-07 DIAGNOSIS — S299XXA Unspecified injury of thorax, initial encounter: Secondary | ICD-10-CM | POA: Diagnosis present

## 2020-07-07 DIAGNOSIS — Z87891 Personal history of nicotine dependence: Secondary | ICD-10-CM | POA: Insufficient documentation

## 2020-07-07 DIAGNOSIS — I5033 Acute on chronic diastolic (congestive) heart failure: Secondary | ICD-10-CM | POA: Diagnosis not present

## 2020-07-07 DIAGNOSIS — Z79899 Other long term (current) drug therapy: Secondary | ICD-10-CM | POA: Diagnosis not present

## 2020-07-07 DIAGNOSIS — W19XXXA Unspecified fall, initial encounter: Secondary | ICD-10-CM | POA: Insufficient documentation

## 2020-07-07 DIAGNOSIS — S2231XA Fracture of one rib, right side, initial encounter for closed fracture: Secondary | ICD-10-CM | POA: Diagnosis not present

## 2020-07-07 DIAGNOSIS — S40011A Contusion of right shoulder, initial encounter: Secondary | ICD-10-CM | POA: Insufficient documentation

## 2020-07-07 DIAGNOSIS — M25511 Pain in right shoulder: Secondary | ICD-10-CM | POA: Diagnosis not present

## 2020-07-07 DIAGNOSIS — I110 Hypertensive heart disease with heart failure: Secondary | ICD-10-CM | POA: Insufficient documentation

## 2020-07-07 DIAGNOSIS — N390 Urinary tract infection, site not specified: Secondary | ICD-10-CM | POA: Diagnosis not present

## 2020-07-07 NOTE — ED Notes (Signed)
Per nursing home they gave pt 15mg  of percocet at 0235, 1050, and 1808. Pt has had a total of 45mg  of percocet in less than 24 hours.

## 2020-07-07 NOTE — Discharge Instructions (Addendum)
Follow-up with your doctor for recheck in a couple days

## 2020-07-07 NOTE — ED Triage Notes (Signed)
Pt fall at nursing home last night and nursing facility did xray on right shoulder that shows dislocation.

## 2020-07-07 NOTE — ED Notes (Signed)
Pt returned from xray at this time.

## 2020-07-07 NOTE — ED Provider Notes (Signed)
[facility] EMERGENCY DEPARTMENT Provider Note   CSN: [id number] Arrival date & time: 07/07/20  1859     History Chief Complaint  Patient presents with  . Fall    Brenda Hopkins is a 80 y.o. female.  Patient fell at the nursing home.  There is some question whether she had a dislocated right shoulder.  She fell on her right side  The history is provided by medical records. No language interpreter was used.  Fall This is a new problem. The current episode started yesterday. The problem has not changed since onset.Pertinent negatives include no chest pain. Nothing aggravates the symptoms. Nothing relieves the symptoms. She has tried nothing for the symptoms.       Past Medical History:  Diagnosis Date  . Chronic back pain   . GERD (gastroesophageal reflux disease)   . Hiatal hernia   . Hyperlipidemia   . Hypertension   . Pelvic fracture (HCC) 12/12/2012  . Smoker     Patient Active Problem List   Diagnosis Date Noted  . Pneumoperitoneum   . Hypokalemia 06/29/2020  . Falls 06/29/2020  . Physical deconditioning   . SIRS (systemic inflammatory response syndrome) (HCC) 06/19/2020  . Septic shock (HCC)   . Proctitis   . Acute metabolic encephalopathy 06/18/2020  . Hematochezia 06/18/2020  . Abdominal pain   . Anemia   . Acute on chronic diastolic CHF (congestive heart failure) (HCC) 06/12/2020  . Gi Tract---Marginal ulcer 01/30/2020  . Noncompliance with medication regimen 01/30/2020  . AKI (acute kidney injury) (HCC) 01/30/2020  . Dehydration 01/30/2020  . Hypoalbuminemia 01/30/2020  . Obesity (BMI 30.0-34.9) 01/30/2020  . Nausea 01/30/2020  . Acute respiratory failure with hypoxia (HCC) 01/30/2020  . Epigastric pain 01/16/2020  . Ventral hernia without obstruction or gangrene   . Intractable epigastric abdominal pain 01/15/2020  . Insomnia 05/13/2014  . Generalized anxiety disorder 07/15/2013  . Hypertension   . Chronic back pain   . Hyperlipidemia   .  GERD (gastroesophageal reflux disease)   . Hiatal hernia   . Smoker   . Pelvic fracture (HCC) 12/12/2012    Past Surgical History:  Procedure Laterality Date  . ABDOMINAL HYSTERECTOMY    . APPENDECTOMY    . BALLOON DILATION N/A 06/17/2020   Procedure: BALLOON DILATION;  Surgeon: Malissa Hippo, MD;  Location: [facility];  Service: Endoscopy;  Laterality: N/A;  . ESOPHAGOGASTRODUODENOSCOPY (EGD) WITH PROPOFOL N/A 01/17/2020   Procedure: ESOPHAGOGASTRODUODENOSCOPY (EGD) WITH PROPOFOL;  Surgeon: Dolores Frame, MD;  Location: [facility] ENDO SUITE;  Service: Gastroenterology;  Laterality: N/A;  . ESOPHAGOGASTRODUODENOSCOPY (EGD) WITH PROPOFOL N/A 06/17/2020   Procedure: ESOPHAGOGASTRODUODENOSCOPY (EGD) WITH PROPOFOL;  Surgeon: Malissa Hippo, MD;  Location: [facility] END[facility];  Service: Endoscopy;  Laterality: N/A;  . 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 HEMORROIDECTOMY    . LUMBAR SPINE SURGERY    . OOPHORECTOMY    . TONSILLECTOMY       OB History   No obstetric history on file.     No family history on file.  Social History   Tobacco Use  . Smoking status: Former Smoker    Packs/day: 0.50    Types: Cigarettes  . Smokeless tobacco: Former Neurosurgeon    Quit date: 12/28/2012  Vaping Use  . Vaping Use: Every day  Substance Use Topics  . Alcohol use: No  . Drug use: No    Home Medications Prior to Admission medications   Medication Sig Start Date End  Date Taking? Authorizing Provider  amLODipine (NORVASC) 10 MG tablet Take 0.5 tablets (5 mg total) by mouth daily. 06/24/20   Vassie Loll, MD  atorvastatin (LIPITOR) 20 MG tablet TAKE 1 TABLET BY MOUTH EVERY NIGHT AT BEDTIME 07/11/16   Allayne Butcher B, PA-C  famotidine (PEPCID) 10 MG tablet Take 1 tablet (10 mg total) by mouth 2 (two) times daily. 07/06/20 08/05/20  Sherryll Burger, Pratik D, DO  fidaxomicin (DIFICID) 200 MG TABS tablet Take 1 tablet (200 mg total) by mouth 2 (two) times daily for 7 days. 07/06/20 07/13/20  Sherryll Burger, Pratik D, DO  furosemide  (LASIX) 20 MG tablet Take 1 tablet (20 mg total) by mouth daily. 06/25/20   Vassie Loll, MD  gabapentin (NEURONTIN) 600 MG tablet Take 1 tablet (600 mg total) by mouth 2 (two) times daily. 06/24/20   Vassie Loll, MD  Iron Combinations (NIFEREX) TABS Take 1 tablet by mouth daily. 06/24/20   Vassie Loll, MD  metoprolol tartrate (LOPRESSOR) 25 MG tablet Take 1 tablet (25 mg total) by mouth 2 (two) times daily. 06/24/20   Vassie Loll, MD  nystatin (MYCOSTATIN/NYSTOP) powder Apply topically 2 (two) times daily. 07/06/20   Sherryll Burger, Pratik D, DO  oxyCODONE (ROXICODONE) 15 MG immediate release tablet Take 1 tablet (15 mg total) by mouth every 6 (six) hours as needed for severe pain or breakthrough pain. 07/06/20   Sherryll Burger, Pratik D, DO  potassium chloride SA (KLOR-CON) 20 MEQ tablet Take 20 mEq by mouth 2 (two) times daily. 01/02/20   [provider]  QUEtiapine (SEROQUEL) 200 MG tablet Take 200 mg by mouth at bedtime. 10/22/19   [provider]  rOPINIRole (REQUIP) 3 MG tablet Take 3 mg by mouth at bedtime. 10/23/19   [provider]  sertraline (ZOLOFT) 25 MG tablet Take 25 mg by mouth daily. 05/04/20   [provider]  sucralfate (CARAFATE) 1 g tablet Take 1 tablet (1 g total) by mouth 2 (two) times daily. 01/31/20 03/01/20  Shon Hale, MD    Allergies    Prozac [fluoxetine hcl], Zoloft [sertraline hcl], and Nicoderm [nicotine]  Review of Systems   Review of Systems  Unable to perform ROS: Dementia  Cardiovascular: Negative for chest pain.    Physical Exam Updated Vital Signs BP 111/70   Pulse (!) 54   Temp 98.5 F (36.9 C) (Oral)   Resp 14   Ht 5' (1.524 m)   Wt 65 kg   SpO2 95%   BMI 27.99 kg/m   Physical Exam Vitals and nursing note reviewed.  Constitutional:      Appearance: She is well-developed.  HENT:     Head: Normocephalic.     Nose: Nose normal.  Eyes:     General: No scleral icterus.    Conjunctiva/sclera: Conjunctivae normal.   Neck:     Thyroid: No thyromegaly.  Cardiovascular:     Rate and Rhythm: Normal rate and regular rhythm.     Heart sounds: No murmur heard. No friction rub. No gallop.   Pulmonary:     Breath sounds: No stridor. No wheezing or rales.  Chest:     Chest wall: No tenderness.  Abdominal:     General: There is no distension.     Tenderness: There is no abdominal tenderness. There is no rebound.  Musculoskeletal:     Cervical back: Neck supple.     Comments: Tender right shoulder  Lymphadenopathy:     Cervical: No cervical adenopathy.  Skin:  Findings: No erythema or rash.  Neurological:     Mental Status: She is alert and oriented to person, place, and time.     Motor: No abnormal muscle tone.     Coordination: Coordination normal.  Psychiatric:        Behavior: Behavior normal.     ED Results / Procedures / Treatments   Labs (all labs ordered are listed, but only abnormal results are displayed) Labs Reviewed - No data to display  EKG None  Radiology DG Shoulder Right  Result Date: 07/07/2020 CLINICAL DATA:  80 year old female with fall and right shoulder pain. EXAM: RIGHT SHOULDER - 2+ VIEW COMPARISON:  Right shoulder radiograph dated 02/10/2014 FINDINGS: Evaluation is limited as the axillary view is not provided. No definite acute fracture of the shoulder. Evaluation for fracture is limited due to advanced osteopenia. There is no dislocation. There is degenerative changes of the right shoulder and AC joint. There is narrowing of the acromial humeral distance due to degenerative changes and spurring. Age indeterminate fracture of the posterior right eighth rib. Correlation with point tenderness recommended. The soft tissues are unremarkable. IMPRESSION: 1. No definite acute fracture or dislocation of the right shoulder. 2. Age indeterminate fracture of the posterior right eighth rib. Correlation with point tenderness recommended. Electronically Signed   By: Elgie Collard  M.D.   On: 07/07/2020 20:17    Procedures Procedures   Medications Ordered in ED Medications - No data to display  ED Course  I have reviewed the triage vital signs and the nursing notes.  Pertinent labs & imaging results that were available during my care of the patient were reviewed by me and considered in my medical decision making (see chart for details). X-ray does not show a dislocated shoulder but does show a rib fracture.   MDM Rules/Calculators/A&P                          Patient with contusion to shoulder and rib fractures she is put in a sling and follow-up with her PCP Final Clinical Impression(s) / ED Diagnoses Final diagnoses:  Fall, initial encounter    Rx / DC Orders ED Discharge Orders    None       Bethann Berkshire, MD 07/07/20 2045

## 2020-07-07 NOTE — ED Notes (Signed)
Nursing facility gave pt 20mg  of percocet per EMS

## 2020-07-07 NOTE — ED Notes (Signed)
Pt transported to xray at this time

## 2020-07-07 NOTE — Patient Outreach (Signed)
[facility]) Care Management  07/07/2020  Brenda Hopkins 01/25/1941 [id number]   Member discharged from hospital to SNF.  Will close case at this time, anticipate new referral once discharged from SNF, if she does not remain long term.  Kemper Durie, [place], MSN [facility] Care Management  [facility] Manager [phone]

## 2020-07-09 DIAGNOSIS — R52 Pain, unspecified: Secondary | ICD-10-CM | POA: Diagnosis not present

## 2020-07-09 DIAGNOSIS — G2581 Restless legs syndrome: Secondary | ICD-10-CM | POA: Diagnosis not present

## 2020-07-09 DIAGNOSIS — I10 Essential (primary) hypertension: Secondary | ICD-10-CM | POA: Diagnosis not present

## 2020-07-09 DIAGNOSIS — K219 Gastro-esophageal reflux disease without esophagitis: Secondary | ICD-10-CM | POA: Diagnosis not present

## 2020-07-09 DIAGNOSIS — E785 Hyperlipidemia, unspecified: Secondary | ICD-10-CM | POA: Diagnosis not present

## 2020-07-09 DIAGNOSIS — W15XXXD Fall from cliff, subsequent encounter: Secondary | ICD-10-CM | POA: Diagnosis not present

## 2020-07-09 DIAGNOSIS — F32A Depression, unspecified: Secondary | ICD-10-CM | POA: Diagnosis not present

## 2020-07-14 ENCOUNTER — Ambulatory Visit: Payer: Medicare Other | Admitting: Orthopedic Surgery

## 2020-07-14 DIAGNOSIS — G9341 Metabolic encephalopathy: Secondary | ICD-10-CM | POA: Diagnosis not present

## 2020-07-14 DIAGNOSIS — R5381 Other malaise: Secondary | ICD-10-CM | POA: Diagnosis not present

## 2020-07-15 DIAGNOSIS — J9811 Atelectasis: Secondary | ICD-10-CM | POA: Diagnosis not present

## 2020-07-15 DIAGNOSIS — I517 Cardiomegaly: Secondary | ICD-10-CM | POA: Diagnosis not present

## 2020-07-16 DIAGNOSIS — R059 Cough, unspecified: Secondary | ICD-10-CM | POA: Diagnosis not present

## 2020-07-16 DIAGNOSIS — I10 Essential (primary) hypertension: Secondary | ICD-10-CM | POA: Diagnosis not present

## 2020-07-16 DIAGNOSIS — K219 Gastro-esophageal reflux disease without esophagitis: Secondary | ICD-10-CM | POA: Diagnosis not present

## 2020-07-16 DIAGNOSIS — R5381 Other malaise: Secondary | ICD-10-CM | POA: Diagnosis not present

## 2020-07-21 ENCOUNTER — Encounter: Payer: Self-pay | Admitting: Orthopedic Surgery

## 2020-07-21 ENCOUNTER — Ambulatory Visit (INDEPENDENT_AMBULATORY_CARE_PROVIDER_SITE_OTHER): Payer: Medicare Other | Admitting: Orthopedic Surgery

## 2020-07-21 ENCOUNTER — Ambulatory Visit: Payer: Medicare Other

## 2020-07-21 ENCOUNTER — Other Ambulatory Visit: Payer: Self-pay

## 2020-07-21 VITALS — BP 107/63 | HR 70 | Ht 60.0 in | Wt 143.0 lb

## 2020-07-21 DIAGNOSIS — S4991XA Unspecified injury of right shoulder and upper arm, initial encounter: Secondary | ICD-10-CM

## 2020-07-21 DIAGNOSIS — M19011 Primary osteoarthritis, right shoulder: Secondary | ICD-10-CM | POA: Diagnosis not present

## 2020-07-21 NOTE — Progress Notes (Signed)
New Patient Visit  Assessment: Brenda Hopkins is a 80 y.o. female with the following: Severe right glenohumeral arthritis, with glenoid deformity  Plan: Brenda Hopkins has right shoulder pain secondary to chronic degenerative changes.  X-rays obtained in clinic today are limited, but I reviewed several chest x-rays and chest CTs from the past 18 months which confirms that this is a chronic degenerative issue.  On the scapular Y view, it looks as though the humeral head is abutting the anterior aspect of the glenoid, with severe deformity and bone deficiency of the glenoid.  If she were a surgical candidate, or interested in surgery, we could obtain a CT scan to further characterize the deformity.  However, her pain is tolerable at this time.  She is able to do most things with her right shoulder, and adapts otherwise.  If her pain worsens, we could attempt a steroid injection.  She is not interested in an injection today.  I stressed to her that we could do this in the future if her pain worsens.  She stated understanding.  Continue with activities as tolerated.  Medications as needed.  Follow-up as needed.   Follow-up: Return if symptoms worsen or fail to improve.  Subjective:  No chief complaint on file.   History of Present Illness: Brenda Hopkins is a 80 y.o. RHD female who presents for evaluation of right shoulder pain.  She has had right shoulder pain for several years.  She also reports several falls recently.  She was last seen in the emergency department a couple weeks ago.  At that time, she was noted to have right shoulder pain, as well as some chest pain.  The chest pain was believed to be associated with rib fractures.  She lives in assisted living facility, and is able to do most things for herself.  She does have some difficulty with lifting her arm above the level of her shoulder.  She can get her arm to her head.  If she is unable to reach something, she states she will  use a stepladder.  She is not taking medications on a consistent basis for her shoulder.  She has had steroid injections in the past, but the last one was a while ago she states.   Review of Systems: No fevers or chills No numbness or tingling No chest pain No shortness of breath No bowel or bladder dysfunction No GI distress No headaches   Medical History:  Past Medical History:  Diagnosis Date  . Chronic back pain   . GERD (gastroesophageal reflux disease)   . Hiatal hernia   . Hyperlipidemia   . Hypertension   . Pelvic fracture (HCC) 12/12/2012  . Smoker     Past Surgical History:  Procedure Laterality Date  . ABDOMINAL HYSTERECTOMY    . APPENDECTOMY    . BALLOON DILATION N/A 06/17/2020   Procedure: BALLOON DILATION;  Surgeon: Malissa Hippo, MD;  [facility];  Service: Endoscopy;  Laterality: N/A;  . ESOPHAGOGASTRODUODENOSCOPY (EGD) WITH PROPOFOL N/A 01/17/2020   Procedure: ESOPHAGOGASTRODUODENOSCOPY (EGD) WITH PROPOFOL;  Surgeon: Dolores Frame, MD;  [facility];  Service: Gastroenterology;  Laterality: N/A;  . ESOPHAGOGASTRODUODENOSCOPY (EGD) WITH PROPOFOL N/A 06/17/2020   Procedure: ESOPHAGOGASTRODUODENOSCOPY (EGD) WITH PROPOFOL;  Surgeon: Malissa Hippo, MD;  [facility];  Service: Endoscopy;  Laterality: N/A;  . 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 HEMORROIDECTOMY    . LUMBAR SPINE SURGERY    . OOPHORECTOMY    .  TONSILLECTOMY      No family history on file. Social History   Tobacco Use  . Smoking status: Former Smoker    Packs/day: 0.50    Types: Cigarettes  . Smokeless tobacco: Former Neurosurgeon    Quit date: 12/28/2012  Vaping Use  . Vaping Use: Every day  Substance Use Topics  . Alcohol use: No  . Drug use: No    Allergies  Allergen Reactions  . Prozac [Fluoxetine Hcl] Other (See Comments)    Makes her fell crazy  . Zoloft [Sertraline Hcl]     Made her feel crazy--10/2013  . Nicoderm [Nicotine] Rash    Site  rash    No outpatient medications have been marked as taking for the 07/21/20 encounter (Office Visit) with Oliver Barre, MD.    Objective: BP 107/63   Pulse 70   Ht 5' (1.524 m)   Wt 143 lb (64.9 kg)   BMI 27.93 kg/m   Physical Exam:  General: Confused.  Seated in a wheelchair.  She states that she is living in a friend pool house, but the health aide in the clinic exam room today states she lives in an assisted living facility.  Evaluation the right shoulder demonstrates some diffuse atrophy over the shoulder girdle.  No tenderness to palpation.  The shoulder is not obviously dislocated.  Active forward flexion to approximately 90 degrees.  Abduction at the side to approximately 70 degrees.  Passive forward flexion is limited to 120 degrees.  Abduction passively to 90 degrees.  Limited external rotation at her side.  Sensation is intact distally.  Fingers are warm and well-perfused.    IMAGING: I personally ordered and reviewed the following images   AP and scapular Y x-rays of the right shoulder were obtained in clinic today.  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  Closer look at the scapular Y x-ray demonstrates that the humeral head is abutting the glenoid, and it appears as though there is an anterior and superior glenoid deficiency.  Pression: Severe glenohumeral arthritis, with glenoid deficiency.   New Medications:  No orders of the defined types were placed in this encounter.     Oliver Barre, MD  07/21/2020 1:56 PM

## 2020-07-30 DIAGNOSIS — W19XXXD Unspecified fall, subsequent encounter: Secondary | ICD-10-CM | POA: Diagnosis not present

## 2020-07-30 DIAGNOSIS — G2581 Restless legs syndrome: Secondary | ICD-10-CM | POA: Diagnosis not present

## 2020-07-30 DIAGNOSIS — R5381 Other malaise: Secondary | ICD-10-CM | POA: Diagnosis not present

## 2020-07-30 DIAGNOSIS — F32A Depression, unspecified: Secondary | ICD-10-CM | POA: Diagnosis not present

## 2020-08-03 DIAGNOSIS — I10 Essential (primary) hypertension: Secondary | ICD-10-CM | POA: Diagnosis not present

## 2020-08-03 DIAGNOSIS — F32A Depression, unspecified: Secondary | ICD-10-CM | POA: Diagnosis not present

## 2020-08-03 DIAGNOSIS — K219 Gastro-esophageal reflux disease without esophagitis: Secondary | ICD-10-CM | POA: Diagnosis not present

## 2020-08-03 DIAGNOSIS — G9341 Metabolic encephalopathy: Secondary | ICD-10-CM | POA: Diagnosis not present

## 2020-08-03 DIAGNOSIS — E785 Hyperlipidemia, unspecified: Secondary | ICD-10-CM | POA: Diagnosis not present

## 2020-08-03 DIAGNOSIS — R059 Cough, unspecified: Secondary | ICD-10-CM | POA: Diagnosis not present

## 2020-08-07 ENCOUNTER — Inpatient Hospital Stay (HOSPITAL_COMMUNITY)
Admission: EM | Admit: 2020-08-07 | Discharge: 2020-08-09 | DRG: 193 | Disposition: A | Discharge status: Skilled Nursing Facility | Payer: Medicare Other | Source: Skilled Nursing Facility | Attending: Family Medicine | Admitting: Family Medicine

## 2020-08-07 ENCOUNTER — Other Ambulatory Visit: Payer: Self-pay

## 2020-08-07 ENCOUNTER — Emergency Department (HOSPITAL_COMMUNITY): Payer: Medicare Other

## 2020-08-07 ENCOUNTER — Encounter (HOSPITAL_COMMUNITY): Payer: Self-pay

## 2020-08-07 DIAGNOSIS — Y95 Nosocomial condition: Secondary | ICD-10-CM | POA: Diagnosis present

## 2020-08-07 DIAGNOSIS — J9601 Acute respiratory failure with hypoxia: Secondary | ICD-10-CM | POA: Diagnosis present

## 2020-08-07 DIAGNOSIS — J189 Pneumonia, unspecified organism: Principal | ICD-10-CM | POA: Diagnosis present

## 2020-08-07 DIAGNOSIS — F17210 Nicotine dependence, cigarettes, uncomplicated: Secondary | ICD-10-CM | POA: Diagnosis present

## 2020-08-07 DIAGNOSIS — R279 Unspecified lack of coordination: Secondary | ICD-10-CM | POA: Diagnosis not present

## 2020-08-07 DIAGNOSIS — D509 Iron deficiency anemia, unspecified: Secondary | ICD-10-CM | POA: Diagnosis not present

## 2020-08-07 DIAGNOSIS — I499 Cardiac arrhythmia, unspecified: Secondary | ICD-10-CM | POA: Diagnosis not present

## 2020-08-07 DIAGNOSIS — Z79899 Other long term (current) drug therapy: Secondary | ICD-10-CM | POA: Diagnosis not present

## 2020-08-07 DIAGNOSIS — E860 Dehydration: Secondary | ICD-10-CM | POA: Diagnosis present

## 2020-08-07 DIAGNOSIS — I110 Hypertensive heart disease with heart failure: Secondary | ICD-10-CM | POA: Diagnosis present

## 2020-08-07 DIAGNOSIS — I5032 Chronic diastolic (congestive) heart failure: Secondary | ICD-10-CM | POA: Diagnosis not present

## 2020-08-07 DIAGNOSIS — Z9114 Patient's other noncompliance with medication regimen: Secondary | ICD-10-CM | POA: Diagnosis not present

## 2020-08-07 DIAGNOSIS — G9341 Metabolic encephalopathy: Secondary | ICD-10-CM

## 2020-08-07 DIAGNOSIS — G8929 Other chronic pain: Secondary | ICD-10-CM | POA: Diagnosis present

## 2020-08-07 DIAGNOSIS — R000 Tachycardia, unspecified: Secondary | ICD-10-CM | POA: Diagnosis present

## 2020-08-07 DIAGNOSIS — Z7401 Bed confinement status: Secondary | ICD-10-CM | POA: Diagnosis not present

## 2020-08-07 DIAGNOSIS — K219 Gastro-esophageal reflux disease without esophagitis: Secondary | ICD-10-CM | POA: Diagnosis present

## 2020-08-07 DIAGNOSIS — R5381 Other malaise: Secondary | ICD-10-CM | POA: Diagnosis not present

## 2020-08-07 DIAGNOSIS — Z20822 Contact with and (suspected) exposure to covid-19: Secondary | ICD-10-CM | POA: Diagnosis present

## 2020-08-07 DIAGNOSIS — I517 Cardiomegaly: Secondary | ICD-10-CM | POA: Diagnosis not present

## 2020-08-07 DIAGNOSIS — Z716 Tobacco abuse counseling: Secondary | ICD-10-CM

## 2020-08-07 DIAGNOSIS — G4700 Insomnia, unspecified: Secondary | ICD-10-CM | POA: Diagnosis present

## 2020-08-07 DIAGNOSIS — F17200 Nicotine dependence, unspecified, uncomplicated: Secondary | ICD-10-CM | POA: Diagnosis present

## 2020-08-07 DIAGNOSIS — Z90710 Acquired absence of both cervix and uterus: Secondary | ICD-10-CM | POA: Diagnosis not present

## 2020-08-07 DIAGNOSIS — Z91148 Patient's other noncompliance with medication regimen for other reason: Secondary | ICD-10-CM

## 2020-08-07 DIAGNOSIS — I10 Essential (primary) hypertension: Secondary | ICD-10-CM | POA: Diagnosis present

## 2020-08-07 DIAGNOSIS — S2231XA Fracture of one rib, right side, initial encounter for closed fracture: Secondary | ICD-10-CM | POA: Diagnosis not present

## 2020-08-07 DIAGNOSIS — Z9884 Bariatric surgery status: Secondary | ICD-10-CM | POA: Diagnosis not present

## 2020-08-07 DIAGNOSIS — F411 Generalized anxiety disorder: Secondary | ICD-10-CM | POA: Diagnosis present

## 2020-08-07 DIAGNOSIS — Z743 Need for continuous supervision: Secondary | ICD-10-CM | POA: Diagnosis not present

## 2020-08-07 DIAGNOSIS — E785 Hyperlipidemia, unspecified: Secondary | ICD-10-CM | POA: Diagnosis not present

## 2020-08-07 DIAGNOSIS — Z66 Do not resuscitate: Secondary | ICD-10-CM | POA: Diagnosis not present

## 2020-08-07 DIAGNOSIS — R531 Weakness: Secondary | ICD-10-CM | POA: Diagnosis not present

## 2020-08-07 DIAGNOSIS — I509 Heart failure, unspecified: Secondary | ICD-10-CM

## 2020-08-07 DIAGNOSIS — R6889 Other general symptoms and signs: Secondary | ICD-10-CM | POA: Diagnosis not present

## 2020-08-07 DIAGNOSIS — Z888 Allergy status to other drugs, medicaments and biological substances status: Secondary | ICD-10-CM

## 2020-08-07 DIAGNOSIS — E782 Mixed hyperlipidemia: Secondary | ICD-10-CM | POA: Diagnosis not present

## 2020-08-07 DIAGNOSIS — R0602 Shortness of breath: Secondary | ICD-10-CM | POA: Diagnosis not present

## 2020-08-07 DIAGNOSIS — M549 Dorsalgia, unspecified: Secondary | ICD-10-CM | POA: Diagnosis present

## 2020-08-07 DIAGNOSIS — J80 Acute respiratory distress syndrome: Secondary | ICD-10-CM | POA: Diagnosis not present

## 2020-08-07 DIAGNOSIS — K449 Diaphragmatic hernia without obstruction or gangrene: Secondary | ICD-10-CM | POA: Diagnosis not present

## 2020-08-07 DIAGNOSIS — R0902 Hypoxemia: Secondary | ICD-10-CM | POA: Diagnosis not present

## 2020-08-07 HISTORY — DX: Heart failure, unspecified: I50.9

## 2020-08-07 LAB — CBC
HCT: 32 % — ABNORMAL LOW (ref 36.0–46.0)
Hemoglobin: 9.2 g/dL — ABNORMAL LOW (ref 12.0–15.0)
MCH: 26.4 pg (ref 26.0–34.0)
MCHC: 28.8 g/dL — ABNORMAL LOW (ref 30.0–36.0)
MCV: 91.7 fL (ref 80.0–100.0)
Platelets: 501 10*3/uL — ABNORMAL HIGH (ref 150–400)
RBC: 3.49 MIL/uL — ABNORMAL LOW (ref 3.87–5.11)
RDW: 19 % — ABNORMAL HIGH (ref 11.5–15.5)
WBC: 9.1 10*3/uL (ref 4.0–10.5)
nRBC: 0 % (ref 0.0–0.2)

## 2020-08-07 LAB — COMPREHENSIVE METABOLIC PANEL
ALT: 13 U/L (ref 0–44)
AST: 12 U/L — ABNORMAL LOW (ref 15–41)
Albumin: 2.3 g/dL — ABNORMAL LOW (ref 3.5–5.0)
Alkaline Phosphatase: 110 U/L (ref 38–126)
Anion gap: 6 (ref 5–15)
BUN: 31 mg/dL — ABNORMAL HIGH (ref 8–23)
CO2: 26 mmol/L (ref 22–32)
Calcium: 7.9 mg/dL — ABNORMAL LOW (ref 8.9–10.3)
Chloride: 100 mmol/L (ref 98–111)
Creatinine, Ser: 1.01 mg/dL — ABNORMAL HIGH (ref 0.44–1.00)
GFR, Estimated: 57 mL/min — ABNORMAL LOW (ref >60)
Glucose, Bld: 123 mg/dL — ABNORMAL HIGH (ref 70–99)
Potassium: 4.6 mmol/L (ref 3.5–5.1)
Sodium: 132 mmol/L — ABNORMAL LOW (ref 135–145)
Total Bilirubin: 0.4 mg/dL (ref 0.3–1.2)
Total Protein: 6.5 g/dL (ref 6.5–8.1)

## 2020-08-07 LAB — TROPONIN I (HIGH SENSITIVITY)
Troponin I (High Sensitivity): 10 ng/L (ref <18)
Troponin I (High Sensitivity): 7 ng/L (ref <18)

## 2020-08-07 LAB — RESP PANEL BY RT-PCR (FLU A&B, COVID) ARPGX2
Influenza A by PCR: NEGATIVE
Influenza B by PCR: NEGATIVE
SARS Coronavirus 2 by RT PCR: NEGATIVE

## 2020-08-07 LAB — HEMOGLOBIN A1C
Hgb A1c MFr Bld: 6.2 % — ABNORMAL HIGH (ref 4.8–5.6)
Mean Plasma Glucose: 131 mg/dL

## 2020-08-07 LAB — PROTIME-INR
INR: 1.1 (ref 0.8–1.2)
Prothrombin Time: 13.9 seconds (ref 11.4–15.2)

## 2020-08-07 LAB — MAGNESIUM: Magnesium: 1.8 mg/dL (ref 1.7–2.4)

## 2020-08-07 LAB — BRAIN NATRIURETIC PEPTIDE: B Natriuretic Peptide: 188 pg/mL — ABNORMAL HIGH (ref 0.0–100.0)

## 2020-08-07 MED ORDER — SODIUM CHLORIDE 0.9 % IV SOLN
2.0000 g | Freq: Once | INTRAVENOUS | Status: AC
  Administered 2020-08-07: 2 g via INTRAVENOUS
  Filled 2020-08-07: qty 20

## 2020-08-07 MED ORDER — ONDANSETRON HCL 4 MG/2ML IJ SOLN: 4.0000 mg | Freq: Four times a day (QID) | INTRAMUSCULAR | Status: DC | PRN

## 2020-08-07 MED ORDER — METOPROLOL TARTRATE 25 MG PO TABS
25.0000 mg | ORAL_TABLET | Freq: Two times a day (BID) | ORAL | Status: DC
  Administered 2020-08-07 – 2020-08-09 (×5): 25 mg via ORAL
  Filled 2020-08-07 (×6): qty 1

## 2020-08-07 MED ORDER — SERTRALINE HCL 50 MG PO TABS
25.0000 mg | ORAL_TABLET | Freq: Every day | ORAL | Status: DC
  Administered 2020-08-07 – 2020-08-09 (×3): 25 mg via ORAL
  Filled 2020-08-07 (×3): qty 1

## 2020-08-07 MED ORDER — POTASSIUM CHLORIDE CRYS ER 20 MEQ PO TBCR
20.0000 meq | EXTENDED_RELEASE_TABLET | Freq: Two times a day (BID) | ORAL | Status: DC
  Administered 2020-08-07 – 2020-08-09 (×5): 20 meq via ORAL
  Filled 2020-08-07 (×5): qty 1

## 2020-08-07 MED ORDER — POLYSACCHARIDE IRON COMPLEX 150 MG PO CAPS
150.0000 mg | ORAL_CAPSULE | Freq: Every day | ORAL | Status: DC
  Administered 2020-08-07 – 2020-08-09 (×3): 150 mg via ORAL
  Filled 2020-08-07 (×3): qty 1

## 2020-08-07 MED ORDER — ONDANSETRON HCL 4 MG PO TABS: 4.0000 mg | ORAL_TABLET | Freq: Four times a day (QID) | ORAL | Status: DC | PRN

## 2020-08-07 MED ORDER — ATORVASTATIN CALCIUM 20 MG PO TABS
20.0000 mg | ORAL_TABLET | Freq: Every day | ORAL | Status: DC
  Administered 2020-08-07 – 2020-08-08 (×2): 20 mg via ORAL
  Filled 2020-08-07 (×2): qty 1

## 2020-08-07 MED ORDER — SODIUM CHLORIDE 0.9 % IV SOLN
2.0000 g | INTRAVENOUS | Status: DC
  Administered 2020-08-08: 2 g via INTRAVENOUS
  Filled 2020-08-07: qty 20

## 2020-08-07 MED ORDER — ACETAMINOPHEN 650 MG RE SUPP: 650.0000 mg | Freq: Four times a day (QID) | RECTAL | Status: DC | PRN

## 2020-08-07 MED ORDER — OXYCODONE HCL 5 MG PO TABS
10.0000 mg | ORAL_TABLET | Freq: Four times a day (QID) | ORAL | Status: DC | PRN
  Administered 2020-08-07 – 2020-08-09 (×9): 10 mg via ORAL
  Filled 2020-08-07 (×9): qty 2

## 2020-08-07 MED ORDER — ROPINIROLE HCL 1 MG PO TABS
3.0000 mg | ORAL_TABLET | Freq: Every day | ORAL | Status: DC
  Administered 2020-08-07 – 2020-08-08 (×2): 3 mg via ORAL
  Filled 2020-08-07 (×5): qty 3

## 2020-08-07 MED ORDER — SACCHAROMYCES BOULARDII 250 MG PO CAPS
500.0000 mg | ORAL_CAPSULE | Freq: Two times a day (BID) | ORAL | Status: DC
  Administered 2020-08-07 – 2020-08-09 (×5): 500 mg via ORAL
  Filled 2020-08-07 (×5): qty 2

## 2020-08-07 MED ORDER — SODIUM CHLORIDE 0.9 % IV SOLN
500.0000 mg | INTRAVENOUS | Status: DC
  Administered 2020-08-07 – 2020-08-08 (×2): 500 mg via INTRAVENOUS
  Filled 2020-08-07 (×2): qty 500

## 2020-08-07 MED ORDER — SODIUM CHLORIDE 0.9 % IV SOLN: INTRAVENOUS | Status: AC

## 2020-08-07 MED ORDER — ACETAMINOPHEN 325 MG PO TABS: 650.0000 mg | ORAL_TABLET | Freq: Four times a day (QID) | ORAL | Status: DC | PRN

## 2020-08-07 MED ORDER — ADULT MULTIVITAMIN W/MINERALS CH
1.0000 | ORAL_TABLET | Freq: Every day | ORAL | Status: DC
  Administered 2020-08-07 – 2020-08-09 (×3): 1 via ORAL
  Filled 2020-08-07 (×3): qty 1

## 2020-08-07 MED ORDER — IOHEXOL 350 MG/ML SOLN
100.0000 mL | Freq: Once | INTRAVENOUS | Status: AC | PRN
  Administered 2020-08-07: 100 mL via INTRAVENOUS

## 2020-08-07 MED ORDER — SUCRALFATE 1 G PO TABS
1.0000 g | ORAL_TABLET | Freq: Two times a day (BID) | ORAL | Status: DC
  Administered 2020-08-07 – 2020-08-09 (×5): 1 g via ORAL
  Filled 2020-08-07 (×5): qty 1

## 2020-08-07 MED ORDER — QUETIAPINE FUMARATE 100 MG PO TABS
200.0000 mg | ORAL_TABLET | Freq: Every day | ORAL | Status: DC
  Administered 2020-08-07 – 2020-08-08 (×2): 200 mg via ORAL
  Filled 2020-08-07 (×2): qty 2

## 2020-08-07 MED ORDER — SODIUM CHLORIDE 0.9 % IV SOLN: 500.0000 mg | Freq: Once | INTRAVENOUS | Status: DC

## 2020-08-07 MED ORDER — IPRATROPIUM-ALBUTEROL 0.5-2.5 (3) MG/3ML IN SOLN
3.0000 mL | Freq: Four times a day (QID) | RESPIRATORY_TRACT | Status: DC
  Administered 2020-08-07 (×3): 3 mL via RESPIRATORY_TRACT
  Filled 2020-08-07 (×3): qty 3

## 2020-08-07 MED ORDER — GUAIFENESIN ER 600 MG PO TB12
1200.0000 mg | ORAL_TABLET | Freq: Two times a day (BID) | ORAL | Status: DC
  Administered 2020-08-07 – 2020-08-08 (×3): 1200 mg via ORAL
  Filled 2020-08-07 (×3): qty 2

## 2020-08-07 MED ORDER — FUROSEMIDE 10 MG/ML IJ SOLN
20.0000 mg | Freq: Once | INTRAMUSCULAR | Status: AC
  Administered 2020-08-07: 20 mg via INTRAVENOUS
  Filled 2020-08-07: qty 2

## 2020-08-07 MED ORDER — PANTOPRAZOLE SODIUM 40 MG PO TBEC
40.0000 mg | DELAYED_RELEASE_TABLET | Freq: Every day | ORAL | Status: DC
  Administered 2020-08-07 – 2020-08-09 (×3): 40 mg via ORAL
  Filled 2020-08-07 (×3): qty 1

## 2020-08-07 MED ORDER — METOPROLOL TARTRATE 5 MG/5ML IV SOLN
5.0000 mg | Freq: Four times a day (QID) | INTRAVENOUS | Status: DC | PRN
  Filled 2020-08-07: qty 5

## 2020-08-07 MED ORDER — CEFTRIAXONE SODIUM 2 G IJ SOLR: 2.0000 g | INTRAMUSCULAR | Status: DC

## 2020-08-07 MED ORDER — OXYCODONE HCL 5 MG PO TABS
5.0000 mg | ORAL_TABLET | ORAL | Status: DC | PRN
Start: 2020-08-07 — End: 2020-08-07

## 2020-08-07 MED ORDER — CHLORHEXIDINE GLUCONATE CLOTH 2 % EX PADS
6.0000 | MEDICATED_PAD | Freq: Every day | CUTANEOUS | Status: DC
  Administered 2020-08-07 – 2020-08-09 (×3): 6 via TOPICAL

## 2020-08-07 MED ORDER — ENOXAPARIN SODIUM 40 MG/0.4ML IJ SOSY
40.0000 mg | PREFILLED_SYRINGE | INTRAMUSCULAR | Status: DC
  Administered 2020-08-07 – 2020-08-08 (×2): 40 mg via SUBCUTANEOUS
  Filled 2020-08-07 (×2): qty 0.4

## 2020-08-07 NOTE — ED Triage Notes (Signed)
Pt brought in by [facility] from [facility] for respiratory distress. Per EMS pt O2 sats were 82% on 4L, pt does not normally wear oxygen, EMS placed pt on non-rebreather and Oxygen sats came up to 100% quickly. Pt able to speak full sentences upon arrival, still having some sob and labored respirations.

## 2020-08-07 NOTE — ED Provider Notes (Addendum)
[facility] Emergency Department Provider Note MRN:  [id number]  Arrival date & time: 08/07/20     Chief Complaint   Respiratory Distress   History of Present Illness   Brenda Hopkins is a 80 y.o. year-old female with a history of congestive heart failure, hypertension presenting to the ED with chief complaint of respiratory distress.  Worsening shortness of breath over the past few days, found to be 82% on 4 L with EMS, normally on room air.  Denies chest pain.  Review of Systems  Positive for shortness of breath.  Patient's Health History    Past Medical History:  Diagnosis Date  . CHF (congestive heart failure) (HCC)   . Chronic back pain   . GERD (gastroesophageal reflux disease)   . Hiatal hernia   . Hyperlipidemia   . Hypertension   . Pelvic fracture (HCC) 12/12/2012  . Smoker     Past Surgical History:  Procedure Laterality Date  . ABDOMINAL HYSTERECTOMY    . APPENDECTOMY    . BALLOON DILATION N/A 06/17/2020   Procedure: BALLOON DILATION;  Surgeon: Malissa Hippo, MD;  [facility];  Service: Endoscopy;  Laterality: N/A;  . ESOPHAGOGASTRODUODENOSCOPY (EGD) WITH PROPOFOL N/A 01/17/2020   Procedure: ESOPHAGOGASTRODUODENOSCOPY (EGD) WITH PROPOFOL;  Surgeon: Dolores Frame, MD;  [facility];  Service: Gastroenterology;  Laterality: N/A;  . ESOPHAGOGASTRODUODENOSCOPY (EGD) WITH PROPOFOL N/A 06/17/2020   Procedure: ESOPHAGOGASTRODUODENOSCOPY (EGD) WITH PROPOFOL;  Surgeon: Malissa Hippo, MD;  [facility];  Service: Endoscopy;  Laterality: N/A;  . 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 HEMORROIDECTOMY    . LUMBAR SPINE SURGERY    . OOPHORECTOMY    . TONSILLECTOMY      No family history on file.  Social History   Socioeconomic History  . Marital status: Divorced    Spouse name: Not on file  . Number of children: Not on file  . Years of education: Not on file  . Highest education level: Not on file   Occupational History  . Not on file  Tobacco Use  . Smoking status: Former Smoker    Packs/day: 0.50    Types: Cigarettes  . Smokeless tobacco: Former Neurosurgeon    Quit date: 12/28/2012  Vaping Use  . Vaping Use: Every day  Substance and Sexual Activity  . Alcohol use: No  . Drug use: No  . Sexual activity: Not on file  Other Topics Concern  . Not on file  Social History Narrative  . Not on file   Social Determinants of Health   Financial Resource Strain: Not on file  Food Insecurity: Not on file  Transportation Needs: Not on file  Physical Activity: Not on file  Stress: Not on file  Social Connections: Not on file  Intimate Partner Violence: Not on file     Physical Exam   Vitals:   08/07/20 0545 08/07/20 0600  BP: (!) 126/53 113/67  Pulse: (!) 113 (!) 114  Resp: 18 17  Temp:    SpO2: 98% 96%    CONSTITUTIONAL: Chronically ill-appearing, NAD NEURO:  Alert and oriented x 3, no focal deficits EYES:  eyes equal and reactive ENT/NECK:  no LAD, no JVD CARDIO: Tachycardic rate, well-perfused, normal S1 and S2 PULM: Decreased breath sounds bilateral bases, tachypneic GI/GU:  normal bowel sounds, non-distended, non-tender MSK/SPINE:  No gross deformities, 2+ pitting edema to bilateral lower extremities SKIN:  no rash, atraumatic PSYCH:  Appropriate speech and behavior  *  Additional and/or pertinent findings included in MDM below  Diagnostic and Interventional Summary    EKG Interpretation  Date/Time:  Friday Aug 07 2020 04:15:42 EDT Ventricular Rate:  115 PR Interval:  159 QRS Duration: 88 QT Interval:  322 QTC Calculation: 446 R Axis:   -30 Text Interpretation: Sinus tachycardia Left ventricular hypertrophy Anterior infarct, old Confirmed by Kennis Carina [phone]) on 08/07/2020 4:25:32 AM      Labs Reviewed  CBC - Abnormal; Notable for the following components:      Result Value   RBC 3.49 (*)    Hemoglobin 9.2 (*)    HCT 32.0 (*)    MCHC 28.8 (*)    RDW  19.0 (*)    Platelets 501 (*)    All other components within normal limits  COMPREHENSIVE METABOLIC PANEL - Abnormal; Notable for the following components:   Sodium 132 (*)    Glucose, Bld 123 (*)    BUN 31 (*)    Creatinine, Ser 1.01 (*)    Calcium 7.9 (*)    Albumin 2.3 (*)    AST 12 (*)    GFR, Estimated 57 (*)    All other components within normal limits  BRAIN NATRIURETIC PEPTIDE - Abnormal; Notable for the following components:   B Natriuretic Peptide 188.0 (*)    All other components within normal limits  RESP PANEL BY RT-PCR (FLU A&B, COVID) ARPGX2  PROTIME-INR  TROPONIN I (HIGH SENSITIVITY)  TROPONIN I (HIGH SENSITIVITY)    DG Chest Port 1 View  Final Result    CT ANGIO CHEST PE W OR WO CONTRAST    (Results Pending)    Medications  furosemide (LASIX) injection 20 mg (20 mg Intravenous Given 08/07/20 0429)  iohexol (OMNIPAQUE) 350 MG/ML injection 100 mL (100 mLs Intravenous Contrast Given 08/07/20 0604)     Procedures  /  Critical Care .Critical Care Performed by: Sabas Sous, MD Authorized by: Sabas Sous, MD   Critical care provider statement:    Critical care time (minutes):  35   Critical care was necessary to treat or prevent imminent or life-threatening deterioration of the following conditions:  Respiratory failure   Critical care was time spent personally by me on the following activities:  Discussions with consultants, evaluation of patient's response to treatment, examination of patient, ordering and performing treatments and interventions, ordering and review of laboratory studies, ordering and review of radiographic studies, pulse oximetry, re-evaluation of patient's condition, obtaining history from patient or surrogate and review of old charts  Ultrasound ED Peripheral IV (Provider)  Date/Time: 08/07/2020 6:33 AM Performed by: Sabas Sous, MD Authorized by: Sabas Sous, MD   Procedure details:    Indications: multiple failed IV  attempts     Skin Prep: chlorhexidine gluconate     Location: Right basilic vein.   Angiocath:  18 G   Bedside Ultrasound Guided: Yes     Patient tolerated procedure without complications: Yes     Dressing applied: Yes      ED Course and Medical Decision Making  I have reviewed the triage vital signs, the nursing notes, and pertinent available records from the EMR.  Listed above are laboratory and imaging tests that I personally ordered, reviewed, and interpreted and then considered in my medical decision making (see below for details).  Hypoxic respiratory failure in the 80 year old female with history of CHF.  CHF exacerbation is the favored underlying pathology.  She is on 6 L nasal cannula  satting at 100%.  Awaiting labs, chest x-ray.  Given the edema will provide Lasix.  Will consider BiPAP if she continues to be tachypneic.     Patient is looking more comfortable, will admit to hospitalist for respiratory failure, likely due to CHF.  Upon chart review, patient was not tachycardic during her recent CHF admission.  Given this and the BNP not being significantly elevated and the chest x-ray not showing demonstrable edema, will proceed with CTA to exclude PE.  Elmer Sow. Pilar Plate, MD [facility] Emergency Medicine [facility] [email or web address]  Final Clinical Impressions(s) / ED Diagnoses     ICD-10-CM   1. Acute respiratory failure with hypoxia (HCC)  J96.01     ED Discharge Orders    None       Discharge Instructions Discussed with and Provided to Patient:   Discharge Instructions   None       Sabas Sous, MD 08/07/20 [id number]    Sabas Sous, MD 08/07/20 [phone]

## 2020-08-07 NOTE — H&P (Signed)
History and Physical  [facility]  Brenda DrapeBrenda Gail Banks GEX:[id number]:[id number] DOB: 11/05/1940 DOA: 08/07/2020  PCP: Shawnie DapperMann, Benjamin L, PA-C  Patient coming from: [facility]  Level of care: Stepdown  I have personally briefly reviewed patient's old medical records [facility]  Chief Complaint: SOB   HPI: Brenda Hopkins is a 80 y.o. female with medical history significant for tobacco, diastolic CHF, obesity, GERD, recent bout of C difficile, hyperlipidemia, hypertension, chronic back pain [facility] with complaint of respiratory distress.  She was noted by EMS to have a pulse ox that was 82% and she was placed on 4L/min [place] and subsequently on a NRB and sats improved to 100%.  Pt was speaking in full sentences but was complaining of shortness of breath.  Pt denies any increased edema in the extremities. Pt denies chest pain.  Pt reports unknown sick contacts given she is living in a SNF. Symptoms have progressed over the last couple of days.  There have been no known fever or chills.     ED Course: pulse ox 100% on 4L/min [place], Pt had a CXR with no specific acute findings.  Pt was persistently tachycardic with a noted sinus tachycardia and HR was in 120s range and was sent for a CTA chest that ruled out PE but was notable for patchy pneumonia.  Pt was started on antibiotics and given IV lasix 20 mg and admission was requested.    Review of Systems: Review of Systems  Constitutional: Positive for malaise/fatigue.  HENT: Negative.   Eyes: Negative.   Respiratory: Positive for cough, sputum production, shortness of breath and wheezing.   Cardiovascular: Positive for palpitations and leg swelling.  Gastrointestinal: Positive for heartburn. Negative for abdominal pain, blood in stool, melena and vomiting.  Genitourinary: Negative.   Musculoskeletal: Negative.   Skin: Negative.   Neurological: Negative.   Endo/Heme/Allergies: Negative.    Psychiatric/Behavioral: Negative.   All other systems reviewed and are negative.    Past Medical History:  Diagnosis Date  . CHF (congestive heart failure) (HCC)   . Chronic back pain   . GERD (gastroesophageal reflux disease)   . Hiatal hernia   . Hyperlipidemia   . Hypertension   . Pelvic fracture (HCC) 12/12/2012  . Smoker     Past Surgical History:  Procedure Laterality Date  . ABDOMINAL HYSTERECTOMY    . APPENDECTOMY    . BALLOON DILATION N/A 06/17/2020   Procedure: BALLOON DILATION;  Surgeon: Malissa Hippoehman, Najeeb U, MD;  [facility];  Service: Endoscopy;  Laterality: N/A;  . ESOPHAGOGASTRODUODENOSCOPY (EGD) WITH PROPOFOL N/A 01/17/2020   Procedure: ESOPHAGOGASTRODUODENOSCOPY (EGD) WITH PROPOFOL;  Surgeon: Dolores Frameastaneda Mayorga, Daniel, MD;  Location: [facility];  Service: Gastroenterology;  Laterality: N/A;  . ESOPHAGOGASTRODUODENOSCOPY (EGD) WITH PROPOFOL N/A 06/17/2020   Procedure: ESOPHAGOGASTRODUODENOSCOPY (EGD) WITH PROPOFOL;  Surgeon: Malissa Hippoehman, Najeeb U, MD;  Location: [facility];  Service: Endoscopy;  Laterality: N/A;  . 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 HEMORROIDECTOMY    . LUMBAR SPINE SURGERY    . OOPHORECTOMY    . TONSILLECTOMY       reports that she has quit smoking. Her smoking use included cigarettes. She smoked 0.50 packs per day. She quit smokeless tobacco use about 7 years ago. She reports that she does not drink alcohol and does not use drugs.  Allergies  Allergen Reactions  . Prozac [Fluoxetine Hcl] Other (See Comments)    Makes her  fell crazy  . Zoloft [Sertraline Hcl]     Made her feel crazy--10/2013  . Nicoderm [Nicotine] Rash    Site rash    History reviewed. No pertinent family history.  Prior to Admission medications   Medication Sig Start Date End Date Taking? Authorizing Provider  amLODipine (NORVASC) 10 MG tablet Take 0.5 tablets (5 mg total) by mouth daily. 06/24/20   Vassie Loll, MD  atorvastatin (LIPITOR) 20 MG tablet TAKE 1 TABLET  BY MOUTH EVERY NIGHT AT BEDTIME 07/11/16   Allayne Butcher B, PA-C  famotidine (PEPCID) 10 MG tablet Take 1 tablet (10 mg total) by mouth 2 (two) times daily. 07/06/20 08/05/20  Sherryll Burger, Pratik D, DO  furosemide (LASIX) 20 MG tablet Take 1 tablet (20 mg total) by mouth daily. 06/25/20   Vassie Loll, MD  gabapentin (NEURONTIN) 600 MG tablet Take 1 tablet (600 mg total) by mouth 2 (two) times daily. 06/24/20   Vassie Loll, MD  Iron Combinations (NIFEREX) TABS Take 1 tablet by mouth daily. 06/24/20   Vassie Loll, MD  metoprolol tartrate (LOPRESSOR) 25 MG tablet Take 1 tablet (25 mg total) by mouth 2 (two) times daily. 06/24/20   Vassie Loll, MD  nystatin (MYCOSTATIN/NYSTOP) powder Apply topically 2 (two) times daily. 07/06/20   Sherryll Burger, Pratik D, DO  oxyCODONE (ROXICODONE) 15 MG immediate release tablet Take 1 tablet (15 mg total) by mouth every 6 (six) hours as needed for severe pain or breakthrough pain. 07/06/20   Sherryll Burger, Pratik D, DO  potassium chloride SA (KLOR-CON) 20 MEQ tablet Take 20 mEq by mouth 2 (two) times daily. 01/02/20   [provider]  QUEtiapine (SEROQUEL) 200 MG tablet Take 200 mg by mouth at bedtime. 10/22/19   [provider]  rOPINIRole (REQUIP) 3 MG tablet Take 3 mg by mouth at bedtime. 10/23/19   [provider]  sertraline (ZOLOFT) 25 MG tablet Take 25 mg by mouth daily. 05/04/20   [provider]  sucralfate (CARAFATE) 1 g tablet Take 1 tablet (1 g total) by mouth 2 (two) times daily. 01/31/20 03/01/20  Shon Hale, MD    Physical Exam: Vitals:   08/07/20 0451 08/07/20 0515 08/07/20 0545 08/07/20 0600  BP:  (!) 114/56 (!) 126/53 113/67  Pulse:  (!) 115 (!) 113 (!) 114  Resp:  19 18 17   Temp: 97.9 F (36.6 C)     TempSrc: Oral     SpO2:  100% 98% 96%  Weight:      Height:        Constitutional: m.o. awake, alert, speaking in full sentences, moderate distress.  Eyes: PERRL, lids and conjunctivae normal ENMT: Mucous membranes are moist.  Posterior pharynx clear of any exudate or lesions.Normal dentition.  Neck: normal, supple, no masses, no thyromegaly Respiratory: rales heard, rare expiratory wheezing, bibasilar crackles. Moderate increased work of breathing.   Cardiovascular: tachycardic rate, normal s1, s2 sounds, no murmurs / rubs / gallops. 1+ extremity edema. 2+ pedal pulses. No carotid bruits.  Abdomen: no tenderness, no masses palpated. No hepatosplenomegaly. Bowel sounds positive.  Musculoskeletal: no clubbing / cyanosis. No joint deformity upper and lower extremities. Good ROM, no contractures. Normal muscle tone.  Skin: no rashes, lesions, ulcers. No induration Neurologic: CN 2-12 grossly intact. Sensation intact, DTR normal. Strength 5/5 in all 4.  Psychiatric: Normal judgment and insight. Alert and oriented x 3. Normal mood.   Labs on Admission: I have personally reviewed following labs and imaging studies  CBC: Recent Labs  Lab  08/07/20 0442  WBC 9.1  HGB 9.2*  HCT 32.0*  MCV 91.7  PLT 501*   Basic Metabolic Panel: Recent Labs  Lab 08/07/20 0442  NA 132*  K 4.6  CL 100  CO2 26  GLUCOSE 123*  BUN 31*  CREATININE 1.01*  CALCIUM 7.9*   GFR: Estimated Creatinine Clearance: 39.2 mL/min (A) (by C-G formula based on SCr of 1.01 mg/dL (H)). Liver Function Tests: Recent Labs  Lab 08/07/20 0442  AST 12*  ALT 13  ALKPHOS 110  BILITOT 0.4  PROT 6.5  ALBUMIN 2.3*   No results for input(s): LIPASE, AMYLASE in the last 168 hours. No results for input(s): AMMONIA in the last 168 hours. Coagulation Profile: Recent Labs  Lab 08/07/20 0442  INR 1.1   Cardiac Enzymes: No results for input(s): CKTOTAL, CKMB, CKMBINDEX, TROPONINI in the last 168 hours. BNP (last 3 results) No results for input(s): PROBNP in the last 8760 hours. HbA1C: No results for input(s): HGBA1C in the last 72 hours. CBG: No results for input(s): GLUCAP in the last 168 hours. Lipid Profile: No results for input(s): CHOL,  HDL, LDLCALC, TRIG, CHOLHDL, LDLDIRECT in the last 72 hours. Thyroid Function Tests: No results for input(s): TSH, T4TOTAL, FREET4, T3FREE, THYROIDAB in the last 72 hours. Anemia Panel: No results for input(s): VITAMINB12, FOLATE, FERRITIN, TIBC, IRON, RETICCTPCT in the last 72 hours. Urine analysis:    Component Value Date/Time   COLORURINE YELLOW 06/30/2020 0129   APPEARANCEUR HAZY (A) 06/30/2020 0129   LABSPEC 1.013 06/30/2020 0129   PHURINE 5.0 06/30/2020 0129   GLUCOSEU NEGATIVE 06/30/2020 0129   HGBUR MODERATE (A) 06/30/2020 0129   BILIRUBINUR NEGATIVE 06/30/2020 0129   KETONESUR NEGATIVE 06/30/2020 0129   PROTEINUR NEGATIVE 06/30/2020 0129   NITRITE NEGATIVE 06/30/2020 0129   LEUKOCYTESUR LARGE (A) 06/30/2020 0129    Radiological Exams on Admission: CT ANGIO CHEST PE W OR WO CONTRAST  Result Date: 08/07/2020 CLINICAL DATA:  Respiratory distress EXAM: CT ANGIOGRAPHY CHEST WITH CONTRAST TECHNIQUE: Multidetector CT imaging of the chest was performed using the standard protocol during bolus administration of intravenous contrast. Multiplanar CT image reconstructions and MIPs were obtained to evaluate the vascular anatomy. CONTRAST:  OMNIPAQUE IOHEXOL 350 MG/ML SOLN COMPARISON:  Chest CT with contrast 06/20/2020 FINDINGS: Cardiovascular: Chronic cardiomegaly. No pericardial effusion. Extensive atheromatous calcification of the aorta and coronaries. Satisfactory opacification of the pulmonary arteries but limited by motion. No evidence of pulmonary embolism. Mediastinum/Nodes: Small sliding hiatal hernia with borderline circumferential thickening of the lower esophagus. No adenopathy, mass, or pneumomediastinum. Lungs/Pleura: Limited by motion artifact. There is streaky and ground-glass airspace type opacity in the bilateral lungs, greatest in the left lower lobe. Some of this opacity appears acute based on prior CT. Upper Abdomen: Partially covered lobulated density in the ventral  epigastrium is transverse colon. Musculoskeletal: Remote bilateral rib fractures. 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 Remote but unhealed T9 compression fracture with vertebra plana and gas containing central fracture cleft. Lucency at the sternum with fracture cleft which has been present since at least 2021. The central lucency is low-density and may reflect pseudoarthrosis with ganglion. Review of the MIP images confirms the above findings. IMPRESSION: 1. Patchy pneumonia with background atelectasis/scarring. 2. No evidence of pulmonary embolism. Sensitivity is diminished by motion artifact. 3. Chronic but unhealed T9 and sternal fractures. Remote rib fractures. 4. Severe atherosclerosis. Electronically Signed   By: Marnee Spring M.D.   On: 08/07/2020 07:07   DG Chest Port 1  View  Result Date: 08/07/2020 CLINICAL DATA:  Shortness of breath EXAM: PORTABLE CHEST 1 VIEW COMPARISON:  07/04/2020 FINDINGS: Low volume chest with chronic streaky densities bilaterally. Remote right rib fractures with callus. Anterior subluxation of the right glenohumeral joint which is severely degenerated, similar to prior studies. Cardiomegaly. No edema, effusion, or pneumothorax. Bilateral skin folds. Artifact from EKG leads. IMPRESSION: Low volume chest with atelectasis/scarring that is similar to prior. No acute finding. Electronically Signed   By: Marnee Spring M.D.   On: 08/07/2020 04:57    EKG: Independently reviewed. Sinus tachycardia   Assessment/Plan Principal Problem:   Acute respiratory failure with hypoxia (HCC) Active Problems:   Hypertension   Hyperlipidemia   GERD (gastroesophageal reflux disease)   Smoker   Generalized anxiety disorder   Insomnia   Noncompliance with medication regimen   Dehydration   Physical deconditioning   HCAP (healthcare-associated pneumonia)   Acute respiratory failure with hypoxia - secondary to pneumonia (HCAP) - continue IV antibiotics and supportive  measures, bronchodilators and supplemental oxygen with plan to wean oxygen as able.   Smoker - Pt still smokes 3 times  Per day, she declines nicotine patch and was counseled on smoking cessation.   Physical deconditioning - she is currently living in SNF for last month, anticipate returning to SNF when medically stabilized.   GERD - protonix ordered for GI protection.   Hyperlipidemia - resume home medications.   Essential hypertension - resume home metoprolol  Sinus tachycardia - PE was ruled out, resume home metoprolol 25 mg BID.   DNR - present on admission, continuing DNR order while in hospital.    DVT prophylaxis:  Enoxaparin  Code Status: DNR   Family Communication: plan of care discussed with patient at bedside, verbalized understanding   Disposition Plan: anticipating return to SNF   Consults called:   Admission status: INP  Level of care: Stepdown Standley Dakins MD [facility] How to contact the [facility] Attending or Consulting provider 7A - 7P or covering provider during after hours 7P -7A, for this patient?  1. Check the care team in [facility] and look for a) attending/consulting [facility] provider listed and b) the [facility] team listed 2. Log into [email or web address] and use 's universal password to access. If you do not have the password, please contact the hospital operator. 3. Locate the [facility] provider you are looking for under [facility] and page to a number that you can be directly reached. 4. If you still have difficulty reaching the provider, please page the [facility] (Director on Call) for the Hospitalists listed on amion for assistance.   If 7PM-7AM, please contact night-coverage [email or web address] Password [facility]  08/07/2020, 7:32 AM

## 2020-08-07 NOTE — ED Notes (Signed)
Patient transported to CT 

## 2020-08-08 ENCOUNTER — Inpatient Hospital Stay (HOSPITAL_COMMUNITY): Payer: Medicare Other

## 2020-08-08 DIAGNOSIS — K219 Gastro-esophageal reflux disease without esophagitis: Secondary | ICD-10-CM | POA: Diagnosis not present

## 2020-08-08 DIAGNOSIS — Z66 Do not resuscitate: Secondary | ICD-10-CM | POA: Diagnosis not present

## 2020-08-08 DIAGNOSIS — J9601 Acute respiratory failure with hypoxia: Secondary | ICD-10-CM | POA: Diagnosis not present

## 2020-08-08 DIAGNOSIS — F411 Generalized anxiety disorder: Secondary | ICD-10-CM | POA: Diagnosis not present

## 2020-08-08 LAB — CBC WITH DIFFERENTIAL/PLATELET
Abs Immature Granulocytes: 0.04 10*3/uL (ref 0.00–0.07)
Basophils Absolute: 0 10*3/uL (ref 0.0–0.1)
Basophils Relative: 1 %
Eosinophils Absolute: 0.1 10*3/uL (ref 0.0–0.5)
Eosinophils Relative: 1 %
HCT: 24.3 % — ABNORMAL LOW (ref 36.0–46.0)
Hemoglobin: 7.1 g/dL — ABNORMAL LOW (ref 12.0–15.0)
Immature Granulocytes: 1 %
Lymphocytes Relative: 19 %
Lymphs Abs: 1.6 10*3/uL (ref 0.7–4.0)
MCH: 26.3 pg (ref 26.0–34.0)
MCHC: 29.2 g/dL — ABNORMAL LOW (ref 30.0–36.0)
MCV: 90 fL (ref 80.0–100.0)
Monocytes Absolute: 0.5 10*3/uL (ref 0.1–1.0)
Monocytes Relative: 6 %
Neutro Abs: 6.2 10*3/uL (ref 1.7–7.7)
Neutrophils Relative %: 72 %
Platelets: 410 10*3/uL — ABNORMAL HIGH (ref 150–400)
RBC: 2.7 MIL/uL — ABNORMAL LOW (ref 3.87–5.11)
RDW: 19.2 % — ABNORMAL HIGH (ref 11.5–15.5)
WBC: 8.5 10*3/uL (ref 4.0–10.5)
nRBC: 0 % (ref 0.0–0.2)

## 2020-08-08 LAB — COMPREHENSIVE METABOLIC PANEL
ALT: 11 U/L (ref 0–44)
AST: 10 U/L — ABNORMAL LOW (ref 15–41)
Albumin: 1.7 g/dL — ABNORMAL LOW (ref 3.5–5.0)
Alkaline Phosphatase: 90 U/L (ref 38–126)
Anion gap: 4 — ABNORMAL LOW (ref 5–15)
BUN: 28 mg/dL — ABNORMAL HIGH (ref 8–23)
CO2: 26 mmol/L (ref 22–32)
Calcium: 7.7 mg/dL — ABNORMAL LOW (ref 8.9–10.3)
Chloride: 106 mmol/L (ref 98–111)
Creatinine, Ser: 0.79 mg/dL (ref 0.44–1.00)
GFR, Estimated: >60 mL/min (ref >60)
Glucose, Bld: 92 mg/dL (ref 70–99)
Potassium: 4.6 mmol/L (ref 3.5–5.1)
Sodium: 136 mmol/L (ref 135–145)
Total Bilirubin: 0.4 mg/dL (ref 0.3–1.2)
Total Protein: 5.1 g/dL — ABNORMAL LOW (ref 6.5–8.1)

## 2020-08-08 LAB — IRON AND TIBC
Iron: 22 ug/dL — ABNORMAL LOW (ref 28–170)
Saturation Ratios: 12 % (ref 10.4–31.8)
TIBC: 191 ug/dL — ABNORMAL LOW (ref 250–450)
UIBC: 169 ug/dL

## 2020-08-08 LAB — RETICULOCYTES
Immature Retic Fract: 16.3 % — ABNORMAL HIGH (ref 2.3–15.9)
RBC.: 2.99 MIL/uL — ABNORMAL LOW (ref 3.87–5.11)
Retic Count, Absolute: 39.2 10*3/uL (ref 19.0–186.0)
Retic Ct Pct: 1.3 % (ref 0.4–3.1)

## 2020-08-08 LAB — FERRITIN: Ferritin: 54 ng/mL (ref 11–307)

## 2020-08-08 LAB — FOLATE: Folate: 13.6 ng/mL (ref >5.9)

## 2020-08-08 LAB — MRSA PCR SCREENING: MRSA by PCR: POSITIVE — AB

## 2020-08-08 LAB — MAGNESIUM: Magnesium: 1.8 mg/dL (ref 1.7–2.4)

## 2020-08-08 LAB — VITAMIN B12: Vitamin B-12: 116 pg/mL — ABNORMAL LOW (ref 180–914)

## 2020-08-08 MED ORDER — IPRATROPIUM-ALBUTEROL 0.5-2.5 (3) MG/3ML IN SOLN
3.0000 mL | Freq: Three times a day (TID) | RESPIRATORY_TRACT | Status: DC
  Administered 2020-08-08 – 2020-08-09 (×4): 3 mL via RESPIRATORY_TRACT
  Filled 2020-08-08 (×4): qty 3

## 2020-08-08 MED ORDER — DOXYCYCLINE HYCLATE 100 MG PO TABS
100.0000 mg | ORAL_TABLET | Freq: Two times a day (BID) | ORAL | Status: DC
  Administered 2020-08-09: 100 mg via ORAL
  Filled 2020-08-08: qty 1

## 2020-08-08 MED ORDER — MUPIROCIN 2 % EX OINT
TOPICAL_OINTMENT | Freq: Two times a day (BID) | CUTANEOUS | Status: DC
  Filled 2020-08-08 (×2): qty 22

## 2020-08-08 MED ORDER — GUAIFENESIN ER 600 MG PO TB12: 1200.0000 mg | ORAL_TABLET | Freq: Two times a day (BID) | ORAL | Status: DC | PRN

## 2020-08-08 NOTE — Progress Notes (Signed)
PROGRESS NOTE   Brenda Hopkins  TDD:[id number] DOB: Mar 02, 1941 DOA: 08/07/2020 PCP: Shawnie Dapper, PA-C   Chief Complaint  Patient presents with  . Respiratory Distress   Level of care: Med-Surg  Brief Admission History:  80 y.o. female with medical history significant for tobacco, diastolic CHF, obesity, GERD, recent bout of C difficile, hyperlipidemia, hypertension, chronic back pain was brought in by [facility] from [facility] with complaint of respiratory distress.  She was noted by EMS to have a pulse ox that was 82% and she was placed on 4L/min [place] and subsequently on a NRB and sats improved to 100%.  Pt was speaking in full sentences but was complaining of shortness of breath.  Pt denies any increased edema in the extremities. Pt denies chest pain.  Pt reports unknown sick contacts given she is living in a SNF. Symptoms have progressed over the last couple of days.  There have been no known fever or chills.  Pt was admitted with HCAP a patchy pneumonia.   Assessment & Plan:   Principal Problem:   Acute respiratory failure with hypoxia (HCC) Active Problems:   Hypertension   Chronic back pain   Hyperlipidemia   GERD (gastroesophageal reflux disease)   Smoker   Generalized anxiety disorder   Insomnia   Noncompliance with medication regimen   Dehydration   Physical deconditioning   HCAP (healthcare-associated pneumonia)   DNR (do not resuscitate)   CHF (congestive heart failure) (HCC)  Acute respiratory failure with hypoxia - secondary to pneumonia (HCAP) - continue IV antibiotics and supportive measures, bronchodilators and supplemental oxygen with plan to wean oxygen as able.   Pt is now down to room air.    Anemia, unspecified - Hg down likely due to hemodilution from IV fluid.  Hemoccult to stool.  Check anemia panel.   Smoker - Pt still smokes 3 times  Per day, she declines nicotine patch and was counseled on smoking cessation.   Physical deconditioning - she is  currently living in SNF for last month, anticipate returning to SNF when medically stabilized.   GERD - protonix ordered for GI protection.   Hyperlipidemia - resume home medications.   Essential hypertension - resume home metoprolol  Sinus tachycardia - resolved now. PE was ruled out, resume home metoprolol 25 mg BID.   DNR - present on admission, continuing DNR order while in hospital.    DVT prophylaxis:  Enoxaparin  Code Status: DNR   Family Communication: plan of care discussed with patient at bedside, verbalized understanding   Disposition Plan: anticipating return to SNF   Consults called:   Admission status: INP  Status is: Inpatient  Remains inpatient appropriate because:IV treatments appropriate due to intensity of illness or inability to take PO and Inpatient level of care appropriate due to severity of illness   Dispo: The patient is from: SNF              Anticipated d/c is to: SNF              Patient currently is not medically stable to d/c.   Difficult to place patient No   Consultants:     Procedures:     Antimicrobials:  Ceftriaxone 5/27>> Azithromycin 5/27>>   Subjective: Pt reports that she still does not feel well.  Denies chest pain, palpitations and SOB.   Objective: Vitals:   08/08/20 0814 08/08/20 0900 08/08/20 0958 08/08/20 1107  BP:  138/72  (!) 146/76  Pulse:  85  61  Resp:  (!) 24    Temp:  (!) 97.3 F (36.3 C) (!) 97.3 F (36.3 C) 98.6 F (37 C)  TempSrc:  Oral Oral Oral  SpO2: 96% 98%  95%  Weight:      Height:        Intake/Output Summary (Last 24 hours) at 08/08/2020 1122 Last data filed at 08/08/2020 0300 Gross per 24 hour  Intake 811.4 ml  Output 500 ml  Net 311.4 ml   Filed Weights   08/07/20 1049 08/07/20 1125 08/08/20 0428  Weight: 62.4 kg 62.4 kg 65 kg    Examination:  General exam: elderly frail, pale appearing female, somnolent but arousable, Appears calm and comfortable  Respiratory system: Clear  to auscultation. Respiratory effort normal. Cardiovascular system: normal S1 & S2 heard. No JVD, murmurs, rubs, gallops or clicks. No pedal edema. Gastrointestinal system: Abdomen is nondistended, soft and nontender. No organomegaly or masses felt. Normal bowel sounds heard. Central nervous system: Alert and oriented. No focal neurological deficits. Extremities: Symmetric 5 x 5 power. Skin: No rashes, lesions or ulcers Psychiatry: Judgement and insight appear poor. Mood & affect appropriate.   Data Reviewed: I have personally reviewed following labs and imaging studies  CBC: Recent Labs  Lab 08/07/20 0442 08/08/20 0443  WBC 9.1 8.5  NEUTROABS  --  6.2  HGB 9.2* 7.1*  HCT 32.0* 24.3*  MCV 91.7 90.0  PLT 501* 410*    Basic Metabolic Panel: Recent Labs  Lab 08/07/20 0442 08/07/20 0649 08/08/20 0443  NA 132*  --  136  K 4.6  --  4.6  CL 100  --  106  CO2 26  --  26  GLUCOSE 123*  --  92  BUN 31*  --  28*  CREATININE 1.01*  --  0.79  CALCIUM 7.9*  --  7.7*  MG  --  1.8 1.8    GFR: Estimated Creatinine Clearance: 49.6 mL/min (by C-G formula based on SCr of 0.79 mg/dL).  Liver Function Tests: Recent Labs  Lab 08/07/20 0442 08/08/20 0443  AST 12* 10*  ALT 13 11  ALKPHOS 110 90  BILITOT 0.4 0.4  PROT 6.5 5.1*  ALBUMIN 2.3* 1.7*    CBG: No results for input(s): GLUCAP in the last 168 hours.  Recent Results (from the past 240 hour(s))  Resp Panel by RT-PCR (Flu A&B, Covid) Nasopharyngeal Swab     Status: None   Collection Time: 08/07/20  5:41 AM   Specimen: Nasopharyngeal Swab; Nasopharyngeal(NP) swabs in vial transport medium  Result Value Ref Range Status   SARS Coronavirus 2 by RT PCR NEGATIVE NEGATIVE Final    Comment: (NOTE) SARS-CoV-2 target nucleic acids are NOT DETECTED.  The SARS-CoV-2 RNA is generally detectable in upper respiratory specimens during the acute phase of infection. The lowest concentration of SARS-CoV-2 viral copies this assay can  detect is 138 copies/mL. A negative result does not preclude SARS-Cov-2 infection and should not be used as the sole basis for treatment or other patient management decisions. A negative result may occur with  improper specimen collection/handling, submission of specimen other than nasopharyngeal swab, presence of viral mutation(s) within the areas targeted by this assay, and inadequate number of viral copies(<138 copies/mL). A negative result must be combined with clinical observations, patient history, and epidemiological information. The expected result is Negative.  Fact Sheet for Patients:  BloggerCourse.com  Fact Sheet for Healthcare Providers:  SeriousBroker.it  This test is no t yet approved  or cleared by the [place] and  has been authorized for detection and/or diagnosis of SARS-CoV-2 by FDA under an Emergency Use Authorization (EUA). This EUA will remain  in effect (meaning this test can be used) for the duration of the COVID-19 declaration under Section 564(b)(1) of the Act, 21 U.S.C.section 360bbb-3(b)(1), unless the authorization is terminated  or revoked sooner.       Influenza A by PCR NEGATIVE NEGATIVE Final   Influenza B by PCR NEGATIVE NEGATIVE Final    Comment: (NOTE) The Xpert Xpress SARS-CoV-2/FLU/RSV plus assay is intended as an aid in the diagnosis of influenza from Nasopharyngeal swab specimens and should not be used as a sole basis for treatment. Nasal washings and aspirates are unacceptable for Xpert Xpress SARS-CoV-2/FLU/RSV testing.  Fact Sheet for Patients: BloggerCourse.com  Fact Sheet for Healthcare Providers: SeriousBroker.it  This test is not yet approved or cleared by the [place] FDA and has been authorized for detection and/or diagnosis of SARS-CoV-2 by FDA under an Emergency Use Authorization (EUA). This EUA will remain in  effect (meaning this test can be used) for the duration of the COVID-19 declaration under Section 564(b)(1) of the Act, 21 U.S.C. section 360bbb-3(b)(1), unless the authorization is terminated or revoked.  Performed at [facility], [address] [place], [place] [address]   MRSA PCR Screening     Status: Abnormal   Collection Time: 08/07/20 10:57 AM   Specimen: Nasopharyngeal  Result Value Ref Range Status   MRSA by PCR POSITIVE (A) NEGATIVE Final    Comment:        The GeneXpert MRSA Assay (FDA approved for NASAL specimens only), is one component of a comprehensive MRSA colonization surveillance program. It is not intended to diagnose MRSA infection nor to guide or monitor treatment for MRSA infections. RESULT CALLED TO, READ BACK BY AND VERIFIED WITH: CALDER @ 0845 ON [id number] BY HENDERSON L. Performed at [facility], [address] [place], [place] [address]      Radiology Studies: CT ANGIO CHEST PE W OR WO CONTRAST  Result Date: 08/07/2020 CLINICAL DATA:  Respiratory distress EXAM: CT ANGIOGRAPHY CHEST WITH CONTRAST TECHNIQUE: Multidetector CT imaging of the chest was performed using the standard protocol during bolus administration of intravenous contrast. Multiplanar CT image reconstructions and MIPs were obtained to evaluate the vascular anatomy. CONTRAST:  OMNIPAQUE IOHEXOL 350 MG/ML SOLN COMPARISON:  Chest CT with contrast 06/20/2020 FINDINGS: Cardiovascular: Chronic cardiomegaly. No pericardial effusion. Extensive atheromatous calcification of the aorta and coronaries. Satisfactory opacification of the pulmonary arteries but limited by motion. No evidence of pulmonary embolism. Mediastinum/Nodes: Small sliding hiatal hernia with borderline circumferential thickening of the lower esophagus. No adenopathy, mass, or pneumomediastinum. Lungs/Pleura: Limited by motion artifact. There is streaky and ground-glass airspace type opacity in the bilateral lungs, greatest in the  left lower lobe. Some of this opacity appears acute based on prior CT. Upper Abdomen: Partially covered lobulated density in the ventral epigastrium is transverse colon. Musculoskeletal: Remote bilateral rib fractures. 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 Remote but unhealed T9 compression fracture with vertebra plana and gas containing central fracture cleft. Lucency at the sternum with fracture cleft which has been present since at least 2021. The central lucency is low-density and may reflect pseudoarthrosis with ganglion. Review of the MIP images confirms the above findings. IMPRESSION: 1. Patchy pneumonia with background atelectasis/scarring. 2. No evidence of pulmonary embolism. Sensitivity is diminished by motion artifact. 3. Chronic but unhealed T9 and sternal fractures.  Remote rib fractures. 4. Severe atherosclerosis. Electronically Signed   By: Marnee SpringJonathon  Watts M.D.   On: 08/07/2020 07:07   DG Chest Port 1 View  Result Date: 08/08/2020 CLINICAL DATA:  Pneumonia.  Short of breath. EXAM: PORTABLE CHEST 1 VIEW COMPARISON:  08/07/2020 and older studies. FINDINGS: Linear opacities noted at the lung bases, stable consistent with atelectasis. Remainder of the lungs is clear. Cardiac silhouette is stable.  No mediastinal or hilar masses. No evidence of a pleural effusion.  No pneumothorax. IMPRESSION: 1. No acute findings and no change from the previous day's exam. Electronically Signed   By: Amie Portlandavid  Ormond M.D.   On: 08/08/2020 10:19   DG Chest Port 1 View  Result Date: 08/07/2020 CLINICAL DATA:  Shortness of breath EXAM: PORTABLE CHEST 1 VIEW COMPARISON:  07/04/2020 FINDINGS: Low volume chest with chronic streaky densities bilaterally. Remote right rib fractures with callus. Anterior subluxation of the right glenohumeral joint which is severely degenerated, similar to prior studies. Cardiomegaly. No edema, effusion, or pneumothorax. Bilateral skin folds. Artifact from EKG leads. IMPRESSION: Low  volume chest with atelectasis/scarring that is similar to prior. No acute finding. Electronically Signed   By: Marnee SpringJonathon  Watts M.D.   On: 08/07/2020 04:57   Scheduled Meds: . atorvastatin  20 mg Oral QHS  . Chlorhexidine Gluconate Cloth  6 each Topical Daily  . enoxaparin (LOVENOX) injection  40 mg Subcutaneous Q24H  . guaiFENesin  1,200 mg Oral BID  . ipratropium-albuterol  3 mL Nebulization TID  . iron polysaccharides  150 mg Oral Daily  . metoprolol tartrate  25 mg Oral BID  . multivitamin with minerals  1 tablet Oral Daily  . mupirocin ointment   Nasal BID  . pantoprazole  40 mg Oral Q0600  . potassium chloride SA  20 mEq Oral BID  . QUEtiapine  200 mg Oral QHS  . rOPINIRole  3 mg Oral QHS  . saccharomyces boulardii  500 mg Oral BID  . sertraline  25 mg Oral Daily  . sucralfate  1 g Oral BID   Continuous Infusions: . azithromycin 500 mg (08/07/20 1132)  . cefTRIAXone (ROCEPHIN)  IV 2 g (08/08/20 1019)    LOS: 1 day   Time spent: 36 mins  Shemia Bevel Laural BenesJohnson, MD How to contact the [facility] Attending or Consulting provider 7A - 7P or covering provider during after hours 7P -7A, for this patient?  1. Check the care team in [facility] and look for a) attending/consulting [facility] provider listed and b) the [facility] team listed 2. Log into [email or web address] and use [facility]'s universal password to access. If you do not have the password, please contact the hospital operator. 3. Locate the [facility] provider you are looking for under [facility] and page to a number that you can be directly reached. 4. If you still have difficulty reaching the provider, please page the [facility] (Director on Call) for the Hospitalists listed on amion for assistance.  08/08/2020, 11:22 AM

## 2020-08-08 NOTE — Progress Notes (Signed)
Pt refused IV access, doctor is aware.

## 2020-08-09 DIAGNOSIS — J9601 Acute respiratory failure with hypoxia: Secondary | ICD-10-CM | POA: Diagnosis not present

## 2020-08-09 DIAGNOSIS — E860 Dehydration: Secondary | ICD-10-CM | POA: Diagnosis not present

## 2020-08-09 DIAGNOSIS — Z66 Do not resuscitate: Secondary | ICD-10-CM | POA: Diagnosis not present

## 2020-08-09 DIAGNOSIS — F411 Generalized anxiety disorder: Secondary | ICD-10-CM | POA: Diagnosis not present

## 2020-08-09 LAB — CBC WITH DIFFERENTIAL/PLATELET
Abs Immature Granulocytes: 0.03 10*3/uL (ref 0.00–0.07)
Basophils Absolute: 0 10*3/uL (ref 0.0–0.1)
Basophils Relative: 1 %
Eosinophils Absolute: 0.1 10*3/uL (ref 0.0–0.5)
Eosinophils Relative: 2 %
HCT: 25.6 % — ABNORMAL LOW (ref 36.0–46.0)
Hemoglobin: 7.5 g/dL — ABNORMAL LOW (ref 12.0–15.0)
Immature Granulocytes: 1 %
Lymphocytes Relative: 30 %
Lymphs Abs: 1.7 10*3/uL (ref 0.7–4.0)
MCH: 26 pg (ref 26.0–34.0)
MCHC: 29.3 g/dL — ABNORMAL LOW (ref 30.0–36.0)
MCV: 88.6 fL (ref 80.0–100.0)
Monocytes Absolute: 0.4 10*3/uL (ref 0.1–1.0)
Monocytes Relative: 8 %
Neutro Abs: 3.2 10*3/uL (ref 1.7–7.7)
Neutrophils Relative %: 58 %
Platelets: 440 10*3/uL — ABNORMAL HIGH (ref 150–400)
RBC: 2.89 MIL/uL — ABNORMAL LOW (ref 3.87–5.11)
RDW: 19.4 % — ABNORMAL HIGH (ref 11.5–15.5)
WBC: 5.5 10*3/uL (ref 4.0–10.5)
nRBC: 0 % (ref 0.0–0.2)

## 2020-08-09 LAB — COMPREHENSIVE METABOLIC PANEL
ALT: 11 U/L (ref 0–44)
AST: 11 U/L — ABNORMAL LOW (ref 15–41)
Albumin: 1.8 g/dL — ABNORMAL LOW (ref 3.5–5.0)
Alkaline Phosphatase: 82 U/L (ref 38–126)
Anion gap: 3 — ABNORMAL LOW (ref 5–15)
BUN: 17 mg/dL (ref 8–23)
CO2: 26 mmol/L (ref 22–32)
Calcium: 7.8 mg/dL — ABNORMAL LOW (ref 8.9–10.3)
Chloride: 105 mmol/L (ref 98–111)
Creatinine, Ser: 0.59 mg/dL (ref 0.44–1.00)
GFR, Estimated: >60 mL/min (ref >60)
Glucose, Bld: 90 mg/dL (ref 70–99)
Potassium: 4.7 mmol/L (ref 3.5–5.1)
Sodium: 134 mmol/L — ABNORMAL LOW (ref 135–145)
Total Bilirubin: 0.3 mg/dL (ref 0.3–1.2)
Total Protein: 5.3 g/dL — ABNORMAL LOW (ref 6.5–8.1)

## 2020-08-09 LAB — MAGNESIUM: Magnesium: 1.8 mg/dL (ref 1.7–2.4)

## 2020-08-09 MED ORDER — IPRATROPIUM-ALBUTEROL 0.5-2.5 (3) MG/3ML IN SOLN: 3.0000 mL | Freq: Three times a day (TID) | RESPIRATORY_TRACT | Status: AC

## 2020-08-09 MED ORDER — SACCHAROMYCES BOULARDII 250 MG PO CAPS: 250.0000 mg | ORAL_CAPSULE | Freq: Two times a day (BID) | ORAL | 0 refills | Status: AC

## 2020-08-09 MED ORDER — AMLODIPINE BESYLATE 5 MG PO TABS
5.0000 mg | ORAL_TABLET | Freq: Every day | ORAL | Status: DC
  Administered 2020-08-09: 5 mg via ORAL
  Filled 2020-08-09: qty 1

## 2020-08-09 MED ORDER — GABAPENTIN 600 MG PO TABS: 600.0000 mg | ORAL_TABLET | Freq: Two times a day (BID) | ORAL | Status: AC | PRN

## 2020-08-09 MED ORDER — FUROSEMIDE 20 MG PO TABS: 20.0000 mg | ORAL_TABLET | ORAL | 3 refills | Status: AC

## 2020-08-09 MED ORDER — FAMOTIDINE 10 MG PO TABS: 10.0000 mg | ORAL_TABLET | Freq: Every day | ORAL | 0 refills | Status: DC

## 2020-08-09 MED ORDER — DOXYCYCLINE HYCLATE 100 MG PO TABS: 100.0000 mg | ORAL_TABLET | Freq: Two times a day (BID) | ORAL | 0 refills | Status: DC

## 2020-08-09 MED ORDER — OXYCODONE HCL 10 MG PO TABS: 10.0000 mg | ORAL_TABLET | Freq: Three times a day (TID) | ORAL | 0 refills | Status: AC | PRN

## 2020-08-09 MED ORDER — POTASSIUM CHLORIDE CRYS ER 10 MEQ PO TBCR
10.0000 meq | EXTENDED_RELEASE_TABLET | ORAL | Status: AC
Start: 2020-08-12 — End: ?

## 2020-08-09 MED ORDER — GUAIFENESIN ER 600 MG PO TB12: 1200.0000 mg | ORAL_TABLET | Freq: Two times a day (BID) | ORAL | 0 refills | Status: DC

## 2020-08-09 NOTE — NC FL2 (Deleted)
[place] MEDICAID FL2 LEVEL OF CARE SCREENING TOOL     IDENTIFICATION  Patient Name: Brenda Hopkins Birthdate: Nov 22, 1940 Sex: female Admission Date (Current Location): 08/07/2020  [facility] and [place] Number:  [employer] and Address:  [facility],  [address], [place] [address]      Provider Number: [id number]  Attending Physician Name and Address:  Cleora Fleet, MD  Relative Name and Phone Number:  Servando Snare (Daughter)   [phone]    Current Level of Care: SNF Recommended Level of Care: Nursing Facility Prior Approval Number:    Date Approved/Denied: 06/30/20 PASRR Number: [id number]  Discharge Plan: SNF    Current Diagnoses: Patient Active Problem List   Diagnosis Date Noted  . HCAP (healthcare-associated pneumonia) 08/07/2020  . DNR (do not resuscitate) 08/07/2020  . CHF (congestive heart failure) (HCC)   . Pneumoperitoneum   . Hypokalemia 06/29/2020  . Falls 06/29/2020  . Physical deconditioning   . SIRS (systemic inflammatory response syndrome) (HCC) 06/19/2020  . Proctitis   . Acute metabolic encephalopathy 06/18/2020  . Hematochezia 06/18/2020  . Abdominal pain   . Anemia   . Acute on chronic diastolic CHF (congestive heart failure) (HCC) 06/12/2020  . Gi Tract---Marginal ulcer 01/30/2020  . Noncompliance with medication regimen 01/30/2020  . AKI (acute kidney injury) (HCC) 01/30/2020  . Dehydration 01/30/2020  . Hypoalbuminemia 01/30/2020  . Obesity (BMI 30.0-34.9) 01/30/2020  . Nausea 01/30/2020  . Acute respiratory failure with hypoxia (HCC) 01/30/2020  . Epigastric pain 01/16/2020  . Ventral hernia without obstruction or gangrene   . Intractable epigastric abdominal pain 01/15/2020  . Insomnia 05/13/2014  . Generalized anxiety disorder 07/15/2013  . Hypertension   . Chronic back pain   . Hyperlipidemia   . GERD (gastroesophageal reflux disease)   . Hiatal hernia   . Smoker   . Pelvic  fracture (HCC) 12/12/2012    Orientation RESPIRATION BLADDER Height & Weight     Self,Situation,Place  Normal Incontinent Weight: 143 lb 4.8 oz (65 kg) Height:  5' (152.4 cm)  BEHAVIORAL SYMPTOMS/MOOD NEUROLOGICAL BOWEL NUTRITION STATUS      Continent Diet (Diet Heart Room service appropriate? Yes; Fluid consistency: Thin)  AMBULATORY STATUS COMMUNICATION OF NEEDS Skin   Extensive Assist Verbally Other (Comment) (Rash right and Left arm)                       Personal Care Assistance Level of Assistance  Bathing,Feeding,Dressing Bathing Assistance: Maximum assistance Feeding assistance: Limited assistance Dressing Assistance: Maximum assistance     Functional Limitations Info  Sight,Hearing,Speech Sight Info: Adequate Hearing Info: Adequate Speech Info: Adequate    SPECIAL CARE FACTORS FREQUENCY  PT (By licensed PT)     PT Frequency: 5x              Contractures Contractures Info: Not present    Additional Factors Info  Code Status,Allergies Code Status Info: DNR Allergies Info: Prozac (fluoxetine Hcl) Zoloft (sertraline Hcl) Not Specified Nicoderm           Current Medications (08/09/2020):  This is the current hospital active medication list Current Facility-Administered Medications  Medication Dose Route Frequency Provider Last Rate Last Admin  . acetaminophen (TYLENOL) tablet 650 mg  650 mg Oral Q6H PRN Johnson, Clanford L, MD       Or  . acetaminophen (TYLENOL) suppository 650 mg  650 mg Rectal Q6H PRN Cleora Fleet, MD      .  amLODipine (NORVASC) tablet 5 mg  5 mg Oral Daily Johnson, Clanford L, MD   5 mg at 08/09/20 0805  . atorvastatin (LIPITOR) tablet 20 mg  20 mg Oral QHS Johnson, Clanford L, MD   20 mg at 08/08/20 2143  . Chlorhexidine Gluconate Cloth 2 % PADS 6 each  6 each Topical Daily Cleora Fleet, MD   6 each at 08/09/20 0810  . doxycycline (VIBRA-TABS) tablet 100 mg  100 mg Oral Q12H Johnson, Clanford L, MD   100 mg at  08/09/20 0806  . enoxaparin (LOVENOX) injection 40 mg  40 mg Subcutaneous Q24H Johnson, Clanford L, MD   40 mg at 08/08/20 1858  . guaiFENesin (MUCINEX) 12 hr tablet 1,200 mg  1,200 mg Oral BID PRN Johnson, Clanford L, MD      . ipratropium-albuterol (DUONEB) 0.5-2.5 (3) MG/3ML nebulizer solution 3 mL  3 mL Nebulization TID Johnson, Clanford L, MD   3 mL at 08/09/20 0840  . iron polysaccharides (NIFEREX) capsule 150 mg  150 mg Oral Daily Johnson, Clanford L, MD   150 mg at 08/09/20 0804  . metoprolol tartrate (LOPRESSOR) injection 5 mg  5 mg Intravenous Q6H PRN Johnson, Clanford L, MD      . metoprolol tartrate (LOPRESSOR) tablet 25 mg  25 mg Oral BID Johnson, Clanford L, MD   25 mg at 08/09/20 0810  . multivitamin with minerals tablet 1 tablet  1 tablet Oral Daily Laural Benes, Clanford L, MD   1 tablet at 08/09/20 0806  . mupirocin ointment (BACTROBAN) 2 %   Nasal BID Cleora Fleet, MD   Given at 08/09/20 [phone]  . ondansetron (ZOFRAN) tablet 4 mg  4 mg Oral Q6H PRN Johnson, Clanford L, MD       Or  . ondansetron (ZOFRAN) injection 4 mg  4 mg Intravenous Q6H PRN Johnson, Clanford L, MD      . oxyCODONE (Oxy IR/ROXICODONE) immediate release tablet 10 mg  10 mg Oral Q6H PRN Johnson, Clanford L, MD   10 mg at 08/09/20 1030  . pantoprazole (PROTONIX) EC tablet 40 mg  40 mg Oral Q0600 Johnson, Clanford L, MD   40 mg at 08/09/20 0439  . potassium chloride SA (KLOR-CON) CR tablet 20 mEq  20 mEq Oral BID Laural Benes, Clanford L, MD   20 mEq at 08/09/20 0810  . QUEtiapine (SEROQUEL) tablet 200 mg  200 mg Oral QHS Johnson, Clanford L, MD   200 mg at 08/08/20 2150  . rOPINIRole (REQUIP) tablet 3 mg  3 mg Oral QHS Johnson, Clanford L, MD   3 mg at 08/08/20 2150  . saccharomyces boulardii (FLORASTOR) capsule 500 mg  500 mg Oral BID Laural Benes, Clanford L, MD   500 mg at 08/09/20 0805  . sertraline (ZOLOFT) tablet 25 mg  25 mg Oral Daily Johnson, Clanford L, MD   25 mg at 08/09/20 0804  . sucralfate (CARAFATE) tablet  1 g  1 g Oral BID Laural Benes, Clanford L, MD   1 g at 08/09/20 0805     Discharge Medications: Please see discharge summary for a list of discharge medications.  Relevant Imaging Results:  Relevant Lab Results:   Additional Information Pt SSN [id number]  Barry Brunner, LCSW

## 2020-08-09 NOTE — [place] FL2 (Signed)
[place] MEDICAID FL2 LEVEL OF CARE SCREENING TOOL     IDENTIFICATION  Patient Name: Brenda Hopkins Birthdate: 08/25/1940 Sex: female Admission Date (Current Location): 08/07/2020  County and Medicaid Number:  [place]   Facility and Address:  [facility],  [address], [place] [address]      Provider Number: [id number]  Attending Physician Name and Address:  Johnson, Clanford L, MD  Relative Name and Phone Number:  Skidmore, Carmen (Daughter)   [phone]    Current Level of Care: SNF Recommended Level of Care: Nursing Facility Prior Approval Number:    Date Approved/Denied: 06/30/20 PASRR Number: [id number]  Discharge Plan: SNF    Current Diagnoses: Patient Active Problem List   Diagnosis Date Noted  . HCAP (healthcare-associated pneumonia) 08/07/2020  . DNR (do not resuscitate) 08/07/2020  . CHF (congestive heart failure) (HCC)   . Pneumoperitoneum   . Hypokalemia 06/29/2020  . Falls 06/29/2020  . Physical deconditioning   . SIRS (systemic inflammatory response syndrome) (HCC) 06/19/2020  . Proctitis   . Acute metabolic encephalopathy 06/18/2020  . Hematochezia 06/18/2020  . Abdominal pain   . Anemia   . Acute on chronic diastolic CHF (congestive heart failure) (HCC) 06/12/2020  . Gi Tract---Marginal ulcer 01/30/2020  . Noncompliance with medication regimen 01/30/2020  . AKI (acute kidney injury) (HCC) 01/30/2020  . Dehydration 01/30/2020  . Hypoalbuminemia 01/30/2020  . Obesity (BMI 30.0-34.9) 01/30/2020  . Nausea 01/30/2020  . Acute respiratory failure with hypoxia (HCC) 01/30/2020  . Epigastric pain 01/16/2020  . Ventral hernia without obstruction or gangrene   . Intractable epigastric abdominal pain 01/15/2020  . Insomnia 05/13/2014  . Generalized anxiety disorder 07/15/2013  . Hypertension   . Chronic back pain   . Hyperlipidemia   . GERD (gastroesophageal reflux disease)   . Hiatal hernia   . Smoker   . Pelvic  fracture (HCC) 12/12/2012    Orientation RESPIRATION BLADDER Height & Weight     Self,Situation,Place  Normal Incontinent Weight: 143 lb 4.8 oz (65 kg) Height:  5' (152.4 cm)  BEHAVIORAL SYMPTOMS/MOOD NEUROLOGICAL BOWEL NUTRITION STATUS      Continent Diet (Diet Heart Room service appropriate? Yes; Fluid consistency: Thin)  AMBULATORY STATUS COMMUNICATION OF NEEDS Skin   Extensive Assist Verbally Other (Comment) (Rash right and Left arm)                       Personal Care Assistance Level of Assistance  Bathing,Feeding,Dressing Bathing Assistance: Maximum assistance Feeding assistance: Limited assistance Dressing Assistance: Maximum assistance     Functional Limitations Info  Sight,Hearing,Speech Sight Info: Adequate Hearing Info: Adequate Speech Info: Adequate    SPECIAL CARE FACTORS FREQUENCY  PT (By licensed PT)     PT Frequency: 5x              Contractures Contractures Info: Not present    Additional Factors Info  Code Status,Allergies Code Status Info: DNR Allergies Info: Prozac (fluoxetine Hcl) Zoloft (sertraline Hcl) Not Specified Nicoderm           Current Medications (08/09/2020):  This is the current hospital active medication list Current Facility-Administered Medications  Medication Dose Route Frequency Provider Last Rate Last Admin  . acetaminophen (TYLENOL) tablet 650 mg  650 mg Oral Q6H PRN Johnson, Clanford L, MD       Or  . acetaminophen (TYLENOL) suppository 650 mg  650 mg Rectal Q6H PRN Johnson, Clanford L, MD      .   amLODipine (NORVASC) tablet 5 mg  5 mg Oral Daily Johnson, Clanford L, MD   5 mg at 08/09/20 0805  . atorvastatin (LIPITOR) tablet 20 mg  20 mg Oral QHS Johnson, Clanford L, MD   20 mg at 08/08/20 2143  . Chlorhexidine Gluconate Cloth 2 % PADS 6 each  6 each Topical Daily Cleora Fleet, MD   6 each at 08/09/20 0810  . doxycycline (VIBRA-TABS) tablet 100 mg  100 mg Oral Q12H Johnson, Clanford L, MD   100 mg at  08/09/20 0806  . enoxaparin (LOVENOX) injection 40 mg  40 mg Subcutaneous Q24H Johnson, Clanford L, MD   40 mg at 08/08/20 1858  . guaiFENesin (MUCINEX) 12 hr tablet 1,200 mg  1,200 mg Oral BID PRN Johnson, Clanford L, MD      . ipratropium-albuterol (DUONEB) 0.5-2.5 (3) MG/3ML nebulizer solution 3 mL  3 mL Nebulization TID Johnson, Clanford L, MD   3 mL at 08/09/20 0840  . iron polysaccharides (NIFEREX) capsule 150 mg  150 mg Oral Daily Johnson, Clanford L, MD   150 mg at 08/09/20 0804  . metoprolol tartrate (LOPRESSOR) injection 5 mg  5 mg Intravenous Q6H PRN Johnson, Clanford L, MD      . metoprolol tartrate (LOPRESSOR) tablet 25 mg  25 mg Oral BID Johnson, Clanford L, MD   25 mg at 08/09/20 0810  . multivitamin with minerals tablet 1 tablet  1 tablet Oral Daily Laural Benes, Clanford L, MD   1 tablet at 08/09/20 0806  . mupirocin ointment (BACTROBAN) 2 %   Nasal BID Cleora Fleet, MD   Given at 08/09/20 [phone]  . ondansetron (ZOFRAN) tablet 4 mg  4 mg Oral Q6H PRN Johnson, Clanford L, MD       Or  . ondansetron (ZOFRAN) injection 4 mg  4 mg Intravenous Q6H PRN Johnson, Clanford L, MD      . oxyCODONE (Oxy IR/ROXICODONE) immediate release tablet 10 mg  10 mg Oral Q6H PRN Johnson, Clanford L, MD   10 mg at 08/09/20 1030  . pantoprazole (PROTONIX) EC tablet 40 mg  40 mg Oral Q0600 Johnson, Clanford L, MD   40 mg at 08/09/20 0439  . potassium chloride SA (KLOR-CON) CR tablet 20 mEq  20 mEq Oral BID Laural Benes, Clanford L, MD   20 mEq at 08/09/20 0810  . QUEtiapine (SEROQUEL) tablet 200 mg  200 mg Oral QHS Johnson, Clanford L, MD   200 mg at 08/08/20 2150  . rOPINIRole (REQUIP) tablet 3 mg  3 mg Oral QHS Johnson, Clanford L, MD   3 mg at 08/08/20 2150  . saccharomyces boulardii (FLORASTOR) capsule 500 mg  500 mg Oral BID Laural Benes, Clanford L, MD   500 mg at 08/09/20 0805  . sertraline (ZOLOFT) tablet 25 mg  25 mg Oral Daily Johnson, Clanford L, MD   25 mg at 08/09/20 0804  . sucralfate (CARAFATE) tablet  1 g  1 g Oral BID Laural Benes, Clanford L, MD   1 g at 08/09/20 0805     Discharge Medications: Please see discharge summary for a list of discharge medications.  Relevant Imaging Results:  Relevant Lab Results:   Additional Information Pt SSN [id number]  Barry Brunner, LCSW

## 2020-08-09 NOTE — Discharge Instructions (Signed)
IMPORTANT INFORMATION: PAY CLOSE ATTENTION   PHYSICIAN DISCHARGE INSTRUCTIONS  Follow with Primary care provider  Mann, Benjamin L, PA-C  and other consultants as instructed by your Hospitalist Physician  SEEK MEDICAL CARE OR RETURN TO EMERGENCY ROOM IF SYMPTOMS COME BACK, WORSEN OR NEW PROBLEM DEVELOPS   Please note: You were cared for by a hospitalist during your hospital stay. Every effort will be made to forward records to your primary care provider.  You can request that your primary care provider send for your hospital records if they have not received them.  Once you are discharged, your primary care physician will handle any further medical issues. Please note that NO REFILLS for any discharge medications will be authorized once you are discharged, as it is imperative that you return to your primary care physician (or establish a relationship with a primary care physician if you do not have one) for your post hospital discharge needs so that they can reassess your need for medications and monitor your lab values.  Please get a complete blood count and chemistry panel checked by your Primary MD at your next visit, and again as instructed by your Primary MD.  Get Medicines reviewed and adjusted: Please take all your medications with you for your next visit with your Primary MD  Laboratory/radiological data: Please request your Primary MD to go over all hospital tests and procedure/radiological results at the follow up, please ask your primary care provider to get all Hospital records sent to his/her office.  In some cases, they will be blood work, cultures and biopsy results pending at the time of your discharge. Please request that your primary care provider follow up on these results.  If you are diabetic, please bring your blood sugar readings with you to your follow up appointment with primary care.    Please call and make your follow up appointments as soon as possible.    Also  Note the following: If you experience worsening of your admission symptoms, develop shortness of breath, life threatening emergency, suicidal or homicidal thoughts you must seek medical attention immediately by calling 911 or calling your MD immediately  if symptoms less severe.  You must read complete instructions/literature along with all the possible adverse reactions/side effects for all the Medicines you take and that have been prescribed to you. Take any new Medicines after you have completely understood and accpet all the possible adverse reactions/side effects.   Do not drive when taking Pain medications or sleeping medications (Benzodiazepines)  Do not take more than prescribed Pain, Sleep and Anxiety Medications. It is not advisable to combine anxiety,sleep and pain medications without talking with your primary care practitioner  Special Instructions: If you have smoked or chewed Tobacco  in the last 2 yrs please stop smoking, stop any regular Alcohol  and or any Recreational drug use.  Wear Seat belts while driving.  Do not drive if taking any narcotic, mind altering or controlled substances or recreational drugs or alcohol.       

## 2020-08-09 NOTE — Discharge Summary (Signed)
Physician Discharge Summary  Brenda Hopkins QQP:[id number] DOB: 08/23/1940 DOA: 08/07/2020  PCP: Shawnie Dapper, PA-C  Admit date: 08/07/2020 Discharge date: 08/09/2020  Admitted From: SNF  Disposition: SNF   Recommendations for Outpatient Follow-up:  1. Ambulatory referral to palliative care recommended  Discharge Condition: STABLE   CODE STATUS: DNR Diet:  Heart healthy  Brief Hospitalization Summary: Please see all hospital notes, images, labs for full details of the hospitalization. ADMISSION HPI:  Brenda Hopkins is a 80 y.o. female with medical history significant for tobacco, diastolic CHF, obesity, GERD, recent bout of C difficile, hyperlipidemia, hypertension, chronic back pain was brought in by [facility] from [facility] with complaint of respiratory distress.  She was noted by EMS to have a pulse ox that was 82% and she was placed on 4L/min [place] and subsequently on a NRB and sats improved to 100%.  Pt was speaking in full sentences but was complaining of shortness of breath.  Pt denies any increased edema in the extremities. Pt denies chest pain.  Pt reports unknown sick contacts given she is living in a SNF. Symptoms have progressed over the last couple of days.  There have been no known fever or chills.     ED Course: pulse ox 100% on 4L/min [place], Pt had a CXR with no specific acute findings.  Pt was persistently tachycardic with a noted sinus tachycardia and HR was in 120s range and was sent for a CTA chest that ruled out PE but was notable for patchy pneumonia.  Pt was started on antibiotics and given IV lasix 20 mg and admission was requested.    HOSPITAL COURSE   Acute respiratory failure with hypoxia - secondary to pneumonia (HCAP) - Pt was initially treated with IV antibiotics, however IV was lost and patient refuses another IV, and supportive measures, bronchodilators and supplemental oxygen with plan to wean oxygen as able.   Pt is now down to room air.  She was  transitioned over to oral doxycycline to complete course.    Anemia, iron deficiency - Hg down likely due to hemodilution from IV fluid.  Hg has remained stable to improved, continue iron supplementation.     Smoker - Pt still smokes 3 times Per day, she declines nicotine patch and was counseled on smoking cessation.   Physical deconditioning - she is currently living in SNF for last month, anticipate returning to SNF when medically stabilized.   GERD - protonix ordered for GI protection in hospital.   Hyperlipidemia - resume home medications.   Essential hypertension - resume home metoprolol  Sinus tachycardia - resolved now. PE was ruled out, resume home metoprolol 25 mg BID.   DNR - present on admission, continuing DNR order while in hospital.Ambulatory referral to palliative care made.   DVT prophylaxis:Enoxaparin Code Status:DNR Family Communication:plan of care discussed with patient at bedside, verbalized understanding Disposition Plan:return to SNF Consults called:n/a Admission status:INP  Discharge Diagnoses:  Principal Problem:   Acute respiratory failure with hypoxia (HCC) Active Problems:   Hypertension   Chronic back pain   Hyperlipidemia   GERD (gastroesophageal reflux disease)   Smoker   Generalized anxiety disorder   Insomnia   Noncompliance with medication regimen   Dehydration   Physical deconditioning   HCAP (healthcare-associated pneumonia)   DNR (do not resuscitate)   CHF (congestive heart failure) [facility])   Discharge Instructions: Discharge Instructions    Amb Referral to Palliative Care   Complete by: As directed  Allergies as of 08/09/2020      Reactions   Prozac [fluoxetine Hcl] Other (See Comments)   Makes her fell crazy   Zoloft [sertraline Hcl]    Made her feel crazy--10/2013   Nicoderm [nicotine] Rash   Site rash      Medication List    TAKE these medications   amLODipine 10 MG tablet Commonly  known as: NORVASC Take 0.5 tablets (5 mg total) by mouth daily.   atorvastatin 20 MG tablet Commonly known as: LIPITOR TAKE 1 TABLET BY MOUTH EVERY NIGHT AT BEDTIME   doxycycline 100 MG tablet Commonly known as: VIBRA-TABS Take 1 tablet (100 mg total) by mouth every 12 (twelve) hours for 5 days.   famotidine 10 MG tablet Commonly known as: PEPCID Take 1 tablet (10 mg total) by mouth daily. What changed: when to take this   furosemide 20 MG tablet Commonly known as: LASIX Take 1 tablet (20 mg total) by mouth every other day. Start taking on: August 12, 2020 What changed:   when to take this  These instructions start on August 12, 2020. If you are unsure what to do until then, ask your doctor or other care provider.   gabapentin 600 MG tablet Commonly known as: NEURONTIN Take 1 tablet (600 mg total) by mouth 2 (two) times daily as needed (neuropathy pain). What changed:   when to take this  reasons to take this   guaiFENesin 600 MG 12 hr tablet Commonly known as: MUCINEX Take 2 tablets (1,200 mg total) by mouth 2 (two) times daily for 5 days.   ipratropium-albuterol 0.5-2.5 (3) MG/3ML Soln Commonly known as: DUONEB Take 3 mLs by nebulization 3 (three) times daily.   metoprolol tartrate 25 MG tablet Commonly known as: LOPRESSOR Take 1 tablet (25 mg total) by mouth 2 (two) times daily.   multivitamin with minerals tablet Take 1 tablet by mouth daily.   Niferex Tabs Take 1 tablet by mouth daily.   nystatin powder Commonly known as: MYCOSTATIN/NYSTOP Apply topically 2 (two) times daily.   Oxycodone HCl 10 MG Tabs Take 1 tablet (10 mg total) by mouth every 8 (eight) hours as needed for up to 3 days (severe pain). What changed:   medication strength  how much to take  when to take this  reasons to take this   potassium chloride 10 MEQ tablet Commonly known as: KLOR-CON Take 1 tablet (10 mEq total) by mouth every other day. Start taking on: August 12, 2020 What  changed:   medication strength  how much to take  when to take this  These instructions start on August 12, 2020. If you are unsure what to do until then, ask your doctor or other care provider.   QUEtiapine 200 MG tablet Commonly known as: SEROQUEL Take 200 mg by mouth at bedtime.   rOPINIRole 3 MG tablet Commonly known as: REQUIP Take 3 mg by mouth at bedtime.   saccharomyces boulardii 250 MG capsule Commonly known as: FLORASTOR Take 1 capsule (250 mg total) by mouth 2 (two) times daily for 10 days.   sertraline 25 MG tablet Commonly known as: ZOLOFT Take 25 mg by mouth daily.   sucralfate 1 g tablet Commonly known as: CARAFATE Take 1 tablet (1 g total) by mouth 2 (two) times daily.       Follow-up Information    Shawnie DapperMann, Benjamin L, PA-C. Schedule an appointment as soon as possible for a visit in 2 week(s).   Specialties: Advice workerhysician Assistant,  Internal Medicine Contact information: [address] [place] [address] [phone]        Antoine Poche, MD .   Specialty: Cardiology Contact information: [address] [place] [address] [phone]              Allergies  Allergen Reactions  . Prozac [Fluoxetine Hcl] Other (See Comments)    Makes her fell crazy  . Zoloft [Sertraline Hcl]     Made her feel crazy--10/2013  . Nicoderm [Nicotine] Rash    Site rash   Allergies as of 08/09/2020      Reactions   Prozac [fluoxetine Hcl] Other (See Comments)   Makes her fell crazy   Zoloft [sertraline Hcl]    Made her feel crazy--10/2013   Nicoderm [nicotine] Rash   Site rash      Medication List    TAKE these medications   amLODipine 10 MG tablet Commonly known as: NORVASC Take 0.5 tablets (5 mg total) by mouth daily.   atorvastatin 20 MG tablet Commonly known as: LIPITOR TAKE 1 TABLET BY MOUTH EVERY NIGHT AT BEDTIME   doxycycline 100 MG tablet Commonly known as: VIBRA-TABS Take 1 tablet (100 mg total) by mouth every 12  (twelve) hours for 5 days.   famotidine 10 MG tablet Commonly known as: PEPCID Take 1 tablet (10 mg total) by mouth daily. What changed: when to take this   furosemide 20 MG tablet Commonly known as: LASIX Take 1 tablet (20 mg total) by mouth every other day. Start taking on: August 12, 2020 What changed:   when to take this  These instructions start on August 12, 2020. If you are unsure what to do until then, ask your doctor or other care provider.   gabapentin 600 MG tablet Commonly known as: NEURONTIN Take 1 tablet (600 mg total) by mouth 2 (two) times daily as needed (neuropathy pain). What changed:   when to take this  reasons to take this   guaiFENesin 600 MG 12 hr tablet Commonly known as: MUCINEX Take 2 tablets (1,200 mg total) by mouth 2 (two) times daily for 5 days.   ipratropium-albuterol 0.5-2.5 (3) MG/3ML Soln Commonly known as: DUONEB Take 3 mLs by nebulization 3 (three) times daily.   metoprolol tartrate 25 MG tablet Commonly known as: LOPRESSOR Take 1 tablet (25 mg total) by mouth 2 (two) times daily.   multivitamin with minerals tablet Take 1 tablet by mouth daily.   Niferex Tabs Take 1 tablet by mouth daily.   nystatin powder Commonly known as: MYCOSTATIN/NYSTOP Apply topically 2 (two) times daily.   Oxycodone HCl 10 MG Tabs Take 1 tablet (10 mg total) by mouth every 8 (eight) hours as needed for up to 3 days (severe pain). What changed:   medication strength  how much to take  when to take this  reasons to take this   potassium chloride 10 MEQ tablet Commonly known as: KLOR-CON Take 1 tablet (10 mEq total) by mouth every other day. Start taking on: August 12, 2020 What changed:   medication strength  how much to take  when to take this  These instructions start on August 12, 2020. If you are unsure what to do until then, ask your doctor or other care provider.   QUEtiapine 200 MG tablet Commonly known as: SEROQUEL Take 200 mg by mouth  at bedtime.   rOPINIRole 3 MG tablet Commonly known as: REQUIP Take 3 mg by mouth at bedtime.  saccharomyces boulardii 250 MG capsule Commonly known as: FLORASTOR Take 1 capsule (250 mg total) by mouth 2 (two) times daily for 10 days.   sertraline 25 MG tablet Commonly known as: ZOLOFT Take 25 mg by mouth daily.   sucralfate 1 g tablet Commonly known as: CARAFATE Take 1 tablet (1 g total) by mouth 2 (two) times daily.       Procedures/Studies: DG Shoulder Right  Result Date: 07/27/2020 AP and scapular Y x-rays of the right shoulder were obtained in clinic today.  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  Closer look at the scapular Y x-ray demonstrates that the humeral head is abutting the glenoid, and it appears as though there is an anterior and superior glenoid deficiency.  Pression: Severe glenohumeral arthritis, with glenoid deficiency.  CT ANGIO CHEST PE W OR WO CONTRAST  Result Date: 08/07/2020 CLINICAL DATA:  Respiratory distress EXAM: CT ANGIOGRAPHY CHEST WITH CONTRAST TECHNIQUE: Multidetector CT imaging of the chest was performed using the standard protocol during bolus administration of intravenous contrast. Multiplanar CT image reconstructions and MIPs were obtained to evaluate the vascular anatomy. CONTRAST:  OMNIPAQUE IOHEXOL 350 MG/ML SOLN COMPARISON:  Chest CT with contrast 06/20/2020 FINDINGS: Cardiovascular: Chronic cardiomegaly. No pericardial effusion. Extensive atheromatous calcification of the aorta and coronaries. Satisfactory opacification of the pulmonary arteries but limited by motion. No evidence of pulmonary embolism. Mediastinum/Nodes: Small sliding hiatal hernia with borderline circumferential thickening of the lower esophagus. No adenopathy, mass, or pneumomediastinum. Lungs/Pleura: Limited by motion artifact. There is streaky and ground-glass airspace type opacity in the  bilateral lungs, greatest in the left lower lobe. Some of this opacity appears acute based on prior CT. Upper Abdomen: Partially covered lobulated density in the ventral epigastrium is transverse colon. Musculoskeletal: Remote bilateral rib fractures. 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 Remote but unhealed T9 compression fracture with vertebra plana and gas containing central fracture cleft. Lucency at the sternum with fracture cleft which has been present since at least 2021. The central lucency is low-density and may reflect pseudoarthrosis with ganglion. Review of the MIP images confirms the above findings. IMPRESSION: 1. Patchy pneumonia with background atelectasis/scarring. 2. No evidence of pulmonary embolism. Sensitivity is diminished by motion artifact. 3. Chronic but unhealed T9 and sternal fractures. Remote rib fractures. 4. Severe atherosclerosis. Electronically Signed   By: Marnee Spring M.D.   On: 08/07/2020 07:07   DG Chest Port 1 View  Result Date: 08/08/2020 CLINICAL DATA:  Pneumonia.  Short of breath. EXAM: PORTABLE CHEST 1 VIEW COMPARISON:  08/07/2020 and older studies. FINDINGS: Linear opacities noted at the lung bases, stable consistent with atelectasis. Remainder of the lungs is clear. Cardiac silhouette is stable.  No mediastinal or hilar masses. No evidence of a pleural effusion.  No pneumothorax. IMPRESSION: 1. No acute findings and no change from the previous day's exam. Electronically Signed   By: Amie [place] M.D.   On: 08/08/2020 10:19   DG Chest Port 1 View  Result Date: 08/07/2020 CLINICAL DATA:  Shortness of breath EXAM: PORTABLE CHEST 1 VIEW COMPARISON:  07/04/2020 FINDINGS: Low volume chest with chronic streaky densities bilaterally. Remote right rib fractures with callus. Anterior subluxation of the right glenohumeral joint which is severely degenerated, similar to prior studies. Cardiomegaly. No edema, effusion, or pneumothorax. Bilateral skin folds.  Artifact from EKG leads. IMPRESSION: Low volume chest with atelectasis/scarring that is similar to prior. No acute finding. Electronically Signed  By: Marnee Spring M.D.   On: 08/07/2020 04:57      Subjective: Pt reports that she is breathing better, she is eating and drinking well. She wants to go back to her SNF.   Discharge Exam: Vitals:   08/08/20 2023 08/09/20 0436  BP:  (!) 183/88  Pulse:  70  Resp:  17  Temp:  98 F (36.7 C)  SpO2: 95% 97%   Vitals:   08/08/20 1351 08/08/20 2005 08/08/20 2023 08/09/20 0436  BP:  (!) 173/79  (!) 183/88  Pulse:  77  70  Resp:  18  17  Temp:  98.5 F (36.9 C)  98 F (36.7 C)  TempSrc:  Oral  Oral  SpO2: 92% 95% 95% 97%  Weight:      Height:       General: Pt is alert, awake, not in acute distress Cardiovascular: RRR, S1/S2 +, no rubs, no gallops Respiratory: good air movement bilateral with rare expiratory wheezing, no rhonchi Abdominal: Soft, NT, ND, bowel sounds + Extremities: no edema, no cyanosis   The results of significant diagnostics from this hospitalization (including imaging, microbiology, ancillary and laboratory) are listed below for reference.     Microbiology: Recent Results (from the past 240 hour(s))  Resp Panel by RT-PCR (Flu A&B, Covid) Nasopharyngeal Swab     Status: None   Collection Time: 08/07/20  5:41 AM   Specimen: Nasopharyngeal Swab; Nasopharyngeal(NP) swabs in vial transport medium  Result Value Ref Range Status   SARS Coronavirus 2 by RT PCR NEGATIVE NEGATIVE Final    Comment: (NOTE) SARS-CoV-2 target nucleic acids are NOT DETECTED.  The SARS-CoV-2 RNA is generally detectable in upper respiratory specimens during the acute phase of infection. The lowest concentration of SARS-CoV-2 viral copies this assay can detect is 138 copies/mL. A negative result does not preclude SARS-Cov-2 infection and should not be used as the sole basis for treatment or other patient management decisions. A negative  result may occur with  improper specimen collection/handling, submission of specimen other than nasopharyngeal swab, presence of viral mutation(s) within the areas targeted by this assay, and inadequate number of viral copies(<138 copies/mL). A negative result must be combined with clinical observations, patient history, and epidemiological information. The expected result is Negative.  Fact Sheet for Patients:  BloggerCourse.com  Fact Sheet for Healthcare Providers:  SeriousBroker.it  This test is no t yet approved or cleared by the [place] FDA and  has been authorized for detection and/or diagnosis of SARS-CoV-2 by FDA under an Emergency Use Authorization (EUA). This EUA will remain  in effect (meaning this test can be used) for the duration of the COVID-19 declaration under Section 564(b)(1) of the Act, 21 U.S.C.section 360bbb-3(b)(1), unless the authorization is terminated  or revoked sooner.       Influenza A by PCR NEGATIVE NEGATIVE Final   Influenza B by PCR NEGATIVE NEGATIVE Final    Comment: (NOTE) The Xpert Xpress SARS-CoV-2/FLU/RSV plus assay is intended as an aid in the diagnosis of influenza from Nasopharyngeal swab specimens and should not be used as a sole basis for treatment. Nasal washings and aspirates are unacceptable for Xpert Xpress SARS-CoV-2/FLU/RSV testing.  Fact Sheet for Patients: BloggerCourse.com  Fact Sheet for Healthcare Providers: SeriousBroker.it  This test is not yet approved or cleared by the [place] FDA and has been authorized for detection and/or diagnosis of SARS-CoV-2 by FDA under an Emergency Use Authorization (EUA). This EUA will remain in effect (meaning this test  can be used) for the duration of the COVID-19 declaration under Section 564(b)(1) of the Act, 21 U.S.C. section 360bbb-3(b)(1), unless the authorization is  terminated or revoked.  Performed at [facility], [address] [place], [place] [address]   MRSA PCR Screening     Status: Abnormal   Collection Time: 08/07/20 10:57 AM   Specimen: Nasopharyngeal  Result Value Ref Range Status   MRSA by PCR POSITIVE (A) NEGATIVE Final    Comment:        The GeneXpert MRSA Assay (FDA approved for NASAL specimens only), is one component of a comprehensive MRSA colonization surveillance program. It is not intended to diagnose MRSA infection nor to guide or monitor treatment for MRSA infections. RESULT CALLED TO, READ BACK BY AND VERIFIED WITH: CALDER @ 0845 ON [id number] BY HENDERSON L. Performed at [facility], [address] [place], [place] [address]      Labs: BNP (last 3 results) Recent Labs    06/16/20 0454 06/17/20 0648 08/07/20 0442  BNP 198.0* 167.0* 188.0*   Basic Metabolic Panel: Recent Labs  Lab 08/07/20 0442 08/07/20 0649 08/08/20 0443 08/09/20 0415  NA 132*  --  136 134*  K 4.6  --  4.6 4.7  CL 100  --  106 105  CO2 26  --  26 26  GLUCOSE 123*  --  92 90  BUN 31*  --  28* 17  CREATININE 1.01*  --  0.79 0.59  CALCIUM 7.9*  --  7.7* 7.8*  MG  --  1.8 1.8 1.8   Liver Function Tests: Recent Labs  Lab 08/07/20 0442 08/08/20 0443 08/09/20 0415  AST 12* 10* 11*  ALT ALKPHOS 110 90 82  BILITOT 0.4 0.4 0.3  PROT 6.5 5.1* 5.3*  ALBUMIN 2.3* 1.7* 1.8*   No results for input(s): LIPASE, AMYLASE in the last 168 hours. No results for input(s): AMMONIA in the last 168 hours. CBC: Recent Labs  Lab 08/07/20 0442 08/08/20 0443 08/09/20 0415  WBC 9.1 8.5 5.5  NEUTROABS  --  6.2 3.2  HGB 9.2* 7.1* 7.5*  HCT 32.0* 24.3* 25.6*  MCV 91.7 90.0 88.6  PLT 501* 410* 440*   Cardiac Enzymes: No results for input(s): CKTOTAL, CKMB, CKMBINDEX, TROPONINI in the last 168 hours. BNP: Invalid input(s): POCBNP CBG: No results for input(s): GLUCAP in the last 168 hours. D-Dimer No results for input(s): DDIMER in  the last 72 hours. Hgb A1c Recent Labs    08/07/20 0442  HGBA1C 6.2*   Lipid Profile No results for input(s): CHOL, HDL, LDLCALC, TRIG, CHOLHDL, LDLDIRECT in the last 72 hours. Thyroid function studies No results for input(s): TSH, T4TOTAL, T3FREE, THYROIDAB in the last 72 hours.  Invalid input(s): FREET3 Anemia work up Recent Labs    08/08/20 1207  VITAMINB12 116*  FOLATE 13.6  FERRITIN 54  TIBC 191*  IRON 22*  RETICCTPCT 1.3   Urinalysis    Component Value Date/Time   COLORURINE YELLOW 06/30/2020 0129   APPEARANCEUR HAZY (A) 06/30/2020 0129   LABSPEC 1.013 06/30/2020 0129   PHURINE 5.0 06/30/2020 0129   GLUCOSEU NEGATIVE 06/30/2020 0129   HGBUR MODERATE (A) 06/30/2020 0129   BILIRUBINUR NEGATIVE 06/30/2020 0129   KETONESUR NEGATIVE 06/30/2020 0129   PROTEINUR NEGATIVE 06/30/2020 0129   NITRITE NEGATIVE 06/30/2020 0129   LEUKOCYTESUR LARGE (A) 06/30/2020 0129   Sepsis Labs Invalid input(s): PROCALCITONIN,  WBC,  LACTICIDVEN Microbiology Recent Results (from the past 240 hour(s))  Resp Panel by RT-PCR (Flu A&B, Covid) Nasopharyngeal Swab     Status: None   Collection Time: 08/07/20  5:41 AM   Specimen: Nasopharyngeal Swab; Nasopharyngeal(NP) swabs in vial transport medium  Result Value Ref Range Status   SARS Coronavirus 2 by RT PCR NEGATIVE NEGATIVE Final    Comment: (NOTE) SARS-CoV-2 target nucleic acids are NOT DETECTED.  The SARS-CoV-2 RNA is generally detectable in upper respiratory specimens during the acute phase of infection. The lowest concentration of SARS-CoV-2 viral copies this assay can detect is 138 copies/mL. A negative result does not preclude SARS-Cov-2 infection and should not be used as the sole basis for treatment or other patient management decisions. A negative result may occur with  improper specimen collection/handling, submission of specimen other than nasopharyngeal swab, presence of viral mutation(s) within the areas targeted by  this assay, and inadequate number of viral copies(<138 copies/mL). A negative result must be combined with clinical observations, patient history, and epidemiological information. The expected result is Negative.  Fact Sheet for Patients:  BloggerCourse.com  Fact Sheet for Healthcare Providers:  SeriousBroker.it  This test is no t yet approved or cleared by the [place] FDA and  has been authorized for detection and/or diagnosis of SARS-CoV-2 by FDA under an Emergency Use Authorization (EUA). This EUA will remain  in effect (meaning this test can be used) for the duration of the COVID-19 declaration under Section 564(b)(1) of the Act, 21 U.S.C.section 360bbb-3(b)(1), unless the authorization is terminated  or revoked sooner.       Influenza A by PCR NEGATIVE NEGATIVE Final   Influenza B by PCR NEGATIVE NEGATIVE Final    Comment: (NOTE) The Xpert Xpress SARS-CoV-2/FLU/RSV plus assay is intended as an aid in the diagnosis of influenza from Nasopharyngeal swab specimens and should not be used as a sole basis for treatment. Nasal washings and aspirates are unacceptable for Xpert Xpress SARS-CoV-2/FLU/RSV testing.  Fact Sheet for Patients: BloggerCourse.com  Fact Sheet for Healthcare Providers: SeriousBroker.it  This test is not yet approved or cleared by the [place] FDA and has been authorized for detection and/or diagnosis of SARS-CoV-2 by FDA under an Emergency Use Authorization (EUA). This EUA will remain in effect (meaning this test can be used) for the duration of the COVID-19 declaration under Section 564(b)(1) of the Act, 21 U.S.C. section 360bbb-3(b)(1), unless the authorization is terminated or revoked.  Performed at [facility], [address], [place], [place] [address]   MRSA PCR Screening     Status: Abnormal   Collection Time: 08/07/20 10:57 AM    Specimen: Nasopharyngeal  Result Value Ref Range Status   MRSA by PCR POSITIVE (A) NEGATIVE Final    Comment:        The GeneXpert MRSA Assay (FDA approved for NASAL specimens only), is one component of a comprehensive MRSA colonization surveillance program. It is not intended to diagnose MRSA infection nor to guide or monitor treatment for MRSA infections. RESULT CALLED TO, READ BACK BY AND VERIFIED WITH: CALDER @ 0845 ON [id number] BY HENDERSON L. Performed at [facility], [address] [place], [place] [address]    Time coordinating discharge: 38 mins   SIGNED:  Standley Dakins, MD  [facility] 08/09/2020, 10:18 AM How to contact the [facility] Attending or Consulting provider 7A - 7P or covering provider during after hours 7P -7A, for this patient?  1. Check the care team in [facility] and look for a) attending/consulting [facility] provider listed and b) the [facility]  team listed 2. Log into [email or web address] and use [facility]'s universal password to access. If you do not have the password, please contact the hospital operator. 3. Locate the [facility] provider you are looking for under [facility] and page to a number that you can be directly reached. 4. If you still have difficulty reaching the provider, please page the [facility] (Director on Call) for the Hospitalists listed on amion for assistance.

## 2020-08-10 DIAGNOSIS — R5381 Other malaise: Secondary | ICD-10-CM | POA: Diagnosis not present

## 2020-08-10 DIAGNOSIS — G9341 Metabolic encephalopathy: Secondary | ICD-10-CM | POA: Diagnosis not present

## 2020-08-10 DIAGNOSIS — F32A Depression, unspecified: Secondary | ICD-10-CM | POA: Diagnosis not present

## 2020-08-10 DIAGNOSIS — K219 Gastro-esophageal reflux disease without esophagitis: Secondary | ICD-10-CM | POA: Diagnosis not present

## 2020-08-10 DIAGNOSIS — E785 Hyperlipidemia, unspecified: Secondary | ICD-10-CM | POA: Diagnosis not present

## 2020-08-10 DIAGNOSIS — I10 Essential (primary) hypertension: Secondary | ICD-10-CM | POA: Diagnosis not present

## 2020-08-11 DIAGNOSIS — R2689 Other abnormalities of gait and mobility: Secondary | ICD-10-CM | POA: Diagnosis not present

## 2020-08-11 DIAGNOSIS — M6281 Muscle weakness (generalized): Secondary | ICD-10-CM | POA: Diagnosis not present

## 2020-08-11 DIAGNOSIS — N390 Urinary tract infection, site not specified: Secondary | ICD-10-CM | POA: Diagnosis not present

## 2020-08-11 DIAGNOSIS — G9341 Metabolic encephalopathy: Secondary | ICD-10-CM | POA: Diagnosis not present

## 2020-08-11 DIAGNOSIS — R279 Unspecified lack of coordination: Secondary | ICD-10-CM | POA: Diagnosis not present

## 2020-08-12 DIAGNOSIS — S2243XA Multiple fractures of ribs, bilateral, initial encounter for closed fracture: Secondary | ICD-10-CM | POA: Diagnosis not present

## 2020-08-12 DIAGNOSIS — J439 Emphysema, unspecified: Secondary | ICD-10-CM | POA: Diagnosis not present

## 2020-08-12 DIAGNOSIS — R0902 Hypoxemia: Secondary | ICD-10-CM | POA: Diagnosis not present

## 2020-08-12 DIAGNOSIS — S32501A Unspecified fracture of right pubis, initial encounter for closed fracture: Secondary | ICD-10-CM | POA: Diagnosis not present

## 2020-08-12 DIAGNOSIS — I110 Hypertensive heart disease with heart failure: Secondary | ICD-10-CM | POA: Diagnosis not present

## 2020-08-12 DIAGNOSIS — R1110 Vomiting, unspecified: Secondary | ICD-10-CM | POA: Diagnosis not present

## 2020-08-12 DIAGNOSIS — K920 Hematemesis: Secondary | ICD-10-CM | POA: Diagnosis not present

## 2020-08-12 DIAGNOSIS — Z20822 Contact with and (suspected) exposure to covid-19: Secondary | ICD-10-CM | POA: Diagnosis not present

## 2020-08-12 DIAGNOSIS — R110 Nausea: Secondary | ICD-10-CM | POA: Diagnosis not present

## 2020-08-12 DIAGNOSIS — N390 Urinary tract infection, site not specified: Secondary | ICD-10-CM | POA: Diagnosis not present

## 2020-08-12 DIAGNOSIS — R6889 Other general symptoms and signs: Secondary | ICD-10-CM | POA: Diagnosis not present

## 2020-08-12 DIAGNOSIS — Z9884 Bariatric surgery status: Secondary | ICD-10-CM | POA: Diagnosis not present

## 2020-08-12 DIAGNOSIS — S2220XA Unspecified fracture of sternum, initial encounter for closed fracture: Secondary | ICD-10-CM | POA: Diagnosis not present

## 2020-08-12 DIAGNOSIS — I509 Heart failure, unspecified: Secondary | ICD-10-CM | POA: Diagnosis not present

## 2020-08-12 DIAGNOSIS — K439 Ventral hernia without obstruction or gangrene: Secondary | ICD-10-CM | POA: Diagnosis not present

## 2020-08-12 DIAGNOSIS — R58 Hemorrhage, not elsewhere classified: Secondary | ICD-10-CM | POA: Diagnosis not present

## 2020-08-12 DIAGNOSIS — K222 Esophageal obstruction: Secondary | ICD-10-CM | POA: Diagnosis not present

## 2020-08-12 DIAGNOSIS — R109 Unspecified abdominal pain: Secondary | ICD-10-CM | POA: Diagnosis not present

## 2020-08-12 DIAGNOSIS — R2689 Other abnormalities of gait and mobility: Secondary | ICD-10-CM | POA: Diagnosis not present

## 2020-08-12 DIAGNOSIS — Z743 Need for continuous supervision: Secondary | ICD-10-CM | POA: Diagnosis not present

## 2020-08-12 DIAGNOSIS — M6281 Muscle weakness (generalized): Secondary | ICD-10-CM | POA: Diagnosis not present

## 2020-08-13 ENCOUNTER — Inpatient Hospital Stay (HOSPITAL_COMMUNITY)
Admission: AD | Admit: 2020-08-13 | Discharge: 2020-08-18 | DRG: 377 | Disposition: A | Discharge status: Skilled Nursing Facility | Payer: Medicare Other | Source: Other Acute Inpatient Hospital | Attending: Family Medicine | Admitting: Family Medicine

## 2020-08-13 ENCOUNTER — Encounter (HOSPITAL_COMMUNITY): Payer: Self-pay | Admitting: Family Medicine

## 2020-08-13 DIAGNOSIS — Z66 Do not resuscitate: Secondary | ICD-10-CM | POA: Diagnosis present

## 2020-08-13 DIAGNOSIS — I5032 Chronic diastolic (congestive) heart failure: Secondary | ICD-10-CM | POA: Diagnosis present

## 2020-08-13 DIAGNOSIS — M4317 Spondylolisthesis, lumbosacral region: Secondary | ICD-10-CM | POA: Diagnosis not present

## 2020-08-13 DIAGNOSIS — K219 Gastro-esophageal reflux disease without esophagitis: Secondary | ICD-10-CM | POA: Diagnosis not present

## 2020-08-13 DIAGNOSIS — J189 Pneumonia, unspecified organism: Secondary | ICD-10-CM | POA: Diagnosis present

## 2020-08-13 DIAGNOSIS — M549 Dorsalgia, unspecified: Secondary | ICD-10-CM | POA: Diagnosis present

## 2020-08-13 DIAGNOSIS — R110 Nausea: Secondary | ICD-10-CM | POA: Diagnosis not present

## 2020-08-13 DIAGNOSIS — I110 Hypertensive heart disease with heart failure: Secondary | ICD-10-CM | POA: Diagnosis present

## 2020-08-13 DIAGNOSIS — K222 Esophageal obstruction: Secondary | ICD-10-CM | POA: Diagnosis not present

## 2020-08-13 DIAGNOSIS — Z981 Arthrodesis status: Secondary | ICD-10-CM | POA: Diagnosis not present

## 2020-08-13 DIAGNOSIS — Z79899 Other long term (current) drug therapy: Secondary | ICD-10-CM

## 2020-08-13 DIAGNOSIS — M47816 Spondylosis without myelopathy or radiculopathy, lumbar region: Secondary | ICD-10-CM | POA: Diagnosis not present

## 2020-08-13 DIAGNOSIS — Z20822 Contact with and (suspected) exposure to covid-19: Secondary | ICD-10-CM | POA: Diagnosis not present

## 2020-08-13 DIAGNOSIS — G8929 Other chronic pain: Secondary | ICD-10-CM | POA: Diagnosis not present

## 2020-08-13 DIAGNOSIS — J439 Emphysema, unspecified: Secondary | ICD-10-CM | POA: Diagnosis not present

## 2020-08-13 DIAGNOSIS — I10 Essential (primary) hypertension: Secondary | ICD-10-CM | POA: Diagnosis present

## 2020-08-13 DIAGNOSIS — Z743 Need for continuous supervision: Secondary | ICD-10-CM | POA: Diagnosis not present

## 2020-08-13 DIAGNOSIS — D509 Iron deficiency anemia, unspecified: Secondary | ICD-10-CM | POA: Diagnosis not present

## 2020-08-13 DIAGNOSIS — K2950 Unspecified chronic gastritis without bleeding: Secondary | ICD-10-CM | POA: Diagnosis present

## 2020-08-13 DIAGNOSIS — K439 Ventral hernia without obstruction or gangrene: Secondary | ICD-10-CM | POA: Diagnosis present

## 2020-08-13 DIAGNOSIS — I509 Heart failure, unspecified: Secondary | ICD-10-CM | POA: Diagnosis not present

## 2020-08-13 DIAGNOSIS — K760 Fatty (change of) liver, not elsewhere classified: Secondary | ICD-10-CM | POA: Diagnosis not present

## 2020-08-13 DIAGNOSIS — K920 Hematemesis: Secondary | ICD-10-CM | POA: Diagnosis not present

## 2020-08-13 DIAGNOSIS — F17200 Nicotine dependence, unspecified, uncomplicated: Secondary | ICD-10-CM | POA: Diagnosis not present

## 2020-08-13 DIAGNOSIS — R109 Unspecified abdominal pain: Secondary | ICD-10-CM | POA: Diagnosis not present

## 2020-08-13 DIAGNOSIS — Z87891 Personal history of nicotine dependence: Secondary | ICD-10-CM

## 2020-08-13 DIAGNOSIS — M546 Pain in thoracic spine: Secondary | ICD-10-CM | POA: Diagnosis not present

## 2020-08-13 DIAGNOSIS — E785 Hyperlipidemia, unspecified: Secondary | ICD-10-CM | POA: Diagnosis not present

## 2020-08-13 DIAGNOSIS — Z9884 Bariatric surgery status: Secondary | ICD-10-CM

## 2020-08-13 DIAGNOSIS — M545 Low back pain, unspecified: Secondary | ICD-10-CM | POA: Diagnosis not present

## 2020-08-13 DIAGNOSIS — Z79891 Long term (current) use of opiate analgesic: Secondary | ICD-10-CM | POA: Diagnosis not present

## 2020-08-13 DIAGNOSIS — F411 Generalized anxiety disorder: Secondary | ICD-10-CM | POA: Diagnosis present

## 2020-08-13 DIAGNOSIS — S2243XA Multiple fractures of ribs, bilateral, initial encounter for closed fracture: Secondary | ICD-10-CM | POA: Diagnosis not present

## 2020-08-13 DIAGNOSIS — Z980 Intestinal bypass and anastomosis status: Secondary | ICD-10-CM | POA: Diagnosis not present

## 2020-08-13 DIAGNOSIS — K284 Chronic or unspecified gastrojejunal ulcer with hemorrhage: Secondary | ICD-10-CM | POA: Diagnosis not present

## 2020-08-13 DIAGNOSIS — Z888 Allergy status to other drugs, medicaments and biological substances status: Secondary | ICD-10-CM

## 2020-08-13 DIAGNOSIS — D649 Anemia, unspecified: Secondary | ICD-10-CM | POA: Diagnosis present

## 2020-08-13 DIAGNOSIS — I700 Atherosclerosis of aorta: Secondary | ICD-10-CM | POA: Diagnosis not present

## 2020-08-13 DIAGNOSIS — K2970 Gastritis, unspecified, without bleeding: Secondary | ICD-10-CM | POA: Diagnosis not present

## 2020-08-13 DIAGNOSIS — R6889 Other general symptoms and signs: Secondary | ICD-10-CM | POA: Diagnosis not present

## 2020-08-13 DIAGNOSIS — Z90710 Acquired absence of both cervix and uterus: Secondary | ICD-10-CM

## 2020-08-13 DIAGNOSIS — K289 Gastrojejunal ulcer, unspecified as acute or chronic, without hemorrhage or perforation: Secondary | ICD-10-CM | POA: Diagnosis present

## 2020-08-13 DIAGNOSIS — R52 Pain, unspecified: Secondary | ICD-10-CM

## 2020-08-13 LAB — HEMOGLOBIN AND HEMATOCRIT, BLOOD
HCT: 29.2 % — ABNORMAL LOW (ref 36.0–46.0)
Hemoglobin: 8.6 g/dL — ABNORMAL LOW (ref 12.0–15.0)

## 2020-08-13 MED ORDER — IPRATROPIUM-ALBUTEROL 0.5-2.5 (3) MG/3ML IN SOLN: 3.0000 mL | Freq: Four times a day (QID) | RESPIRATORY_TRACT | Status: DC | PRN

## 2020-08-13 MED ORDER — ACETAMINOPHEN 650 MG RE SUPP: 650.0000 mg | Freq: Four times a day (QID) | RECTAL | Status: DC | PRN

## 2020-08-13 MED ORDER — DOXYCYCLINE HYCLATE 100 MG PO TABS
100.0000 mg | ORAL_TABLET | Freq: Two times a day (BID) | ORAL | Status: AC
  Administered 2020-08-13 – 2020-08-14 (×2): 100 mg via ORAL
  Filled 2020-08-13 (×3): qty 1

## 2020-08-13 MED ORDER — ONDANSETRON HCL 4 MG/2ML IJ SOLN: 4.0000 mg | Freq: Four times a day (QID) | INTRAMUSCULAR | Status: DC | PRN

## 2020-08-13 MED ORDER — QUETIAPINE FUMARATE 100 MG PO TABS
200.0000 mg | ORAL_TABLET | Freq: Every day | ORAL | Status: DC
  Administered 2020-08-13 – 2020-08-17 (×5): 200 mg via ORAL
  Filled 2020-08-13 (×5): qty 2

## 2020-08-13 MED ORDER — PANTOPRAZOLE SODIUM 40 MG IV SOLR
40.0000 mg | Freq: Two times a day (BID) | INTRAVENOUS | Status: DC
  Administered 2020-08-13 – 2020-08-18 (×9): 40 mg via INTRAVENOUS
  Filled 2020-08-13 (×9): qty 40

## 2020-08-13 MED ORDER — ACETAMINOPHEN 325 MG PO TABS
650.0000 mg | ORAL_TABLET | Freq: Four times a day (QID) | ORAL | Status: DC | PRN
  Administered 2020-08-14 – 2020-08-18 (×7): 650 mg via ORAL
  Filled 2020-08-13 (×7): qty 2

## 2020-08-13 MED ORDER — AMLODIPINE BESYLATE 5 MG PO TABS
5.0000 mg | ORAL_TABLET | Freq: Every day | ORAL | Status: DC
  Administered 2020-08-15 – 2020-08-18 (×4): 5 mg via ORAL
  Filled 2020-08-13 (×4): qty 1

## 2020-08-13 MED ORDER — FUROSEMIDE 20 MG PO TABS: 20.0000 mg | ORAL_TABLET | ORAL | Status: DC

## 2020-08-13 MED ORDER — ONDANSETRON HCL 4 MG PO TABS
4.0000 mg | ORAL_TABLET | Freq: Four times a day (QID) | ORAL | Status: DC | PRN
  Administered 2020-08-18: 4 mg via ORAL
  Filled 2020-08-13: qty 1

## 2020-08-13 MED ORDER — GUAIFENESIN ER 600 MG PO TB12
1200.0000 mg | ORAL_TABLET | Freq: Two times a day (BID) | ORAL | Status: DC
  Administered 2020-08-13 – 2020-08-18 (×9): 1200 mg via ORAL
  Filled 2020-08-13 (×9): qty 2

## 2020-08-13 MED ORDER — IPRATROPIUM-ALBUTEROL 0.5-2.5 (3) MG/3ML IN SOLN: 3.0000 mL | Freq: Three times a day (TID) | RESPIRATORY_TRACT | Status: DC

## 2020-08-13 MED ORDER — SODIUM CHLORIDE 0.9 % IV SOLN: 250.0000 mL | INTRAVENOUS | Status: DC | PRN

## 2020-08-13 MED ORDER — METOPROLOL TARTRATE 25 MG PO TABS
25.0000 mg | ORAL_TABLET | Freq: Two times a day (BID) | ORAL | Status: DC
  Administered 2020-08-13 – 2020-08-17 (×8): 25 mg via ORAL
  Filled 2020-08-13 (×9): qty 1

## 2020-08-13 MED ORDER — SACCHAROMYCES BOULARDII 250 MG PO CAPS
250.0000 mg | ORAL_CAPSULE | Freq: Two times a day (BID) | ORAL | Status: DC
  Administered 2020-08-14 – 2020-08-18 (×8): 250 mg via ORAL
  Filled 2020-08-13 (×8): qty 1

## 2020-08-13 MED ORDER — ROPINIROLE HCL 1 MG PO TABS
3.0000 mg | ORAL_TABLET | Freq: Every day | ORAL | Status: DC
  Administered 2020-08-13 – 2020-08-17 (×5): 3 mg via ORAL
  Filled 2020-08-13 (×6): qty 3

## 2020-08-13 MED ORDER — SERTRALINE HCL 25 MG PO TABS
25.0000 mg | ORAL_TABLET | Freq: Every day | ORAL | Status: DC
  Administered 2020-08-15 – 2020-08-18 (×4): 25 mg via ORAL
  Filled 2020-08-13 (×4): qty 1

## 2020-08-13 MED ORDER — GABAPENTIN 300 MG PO CAPS
600.0000 mg | ORAL_CAPSULE | Freq: Two times a day (BID) | ORAL | Status: DC | PRN
  Administered 2020-08-14 – 2020-08-17 (×3): 600 mg via ORAL
  Filled 2020-08-13 (×3): qty 2

## 2020-08-13 MED ORDER — FENTANYL CITRATE (PF) 100 MCG/2ML IJ SOLN
25.0000 ug | INTRAMUSCULAR | Status: DC | PRN
Start: 2020-08-13 — End: 2020-08-15
  Administered 2020-08-13 – 2020-08-15 (×7): 25 ug via INTRAVENOUS
  Filled 2020-08-13 (×7): qty 2

## 2020-08-13 MED ORDER — ATORVASTATIN CALCIUM 10 MG PO TABS
20.0000 mg | ORAL_TABLET | Freq: Every day | ORAL | Status: DC
  Administered 2020-08-14 – 2020-08-17 (×3): 20 mg via ORAL
  Filled 2020-08-13 (×3): qty 2

## 2020-08-13 NOTE — Progress Notes (Addendum)
Skin assessment done, patient noted with large ecchymotic areas bilaterally on the arms above the elbow region. Also, patient reports falling two nights ago which resulted in a small opening approximately 2 cm in diameter to the left knee. Patient has a hernia to her mid upper abdominal region, stated that her primary MD is aware.

## 2020-08-13 NOTE — H&P (Signed)
History and Physical    Carry Weesner NWG:[id number] DOB: Feb 12, 1941 DOA: 08/13/2020  PCP: Shawnie Dapper, PA-C   Patient coming from: SNF   Chief Complaint: Abdominal pain, N/V, blood in vomit   HPI: Brenda Hopkins is a 80 y.o. female with medical history significant for chronic diastolic CHF, emphysema, current smoker, anxiety, Roux-en-Y bypass in 2003, and recent admission with pneumonia and acute hypoxic respiratory failure, now presenting from her SNF for evaluation of upper abdominal pain, nausea, vomiting, and hematemesis.  Patient was discharged from [facility] on 08/09/2020 after treatment for pneumonia, but reports that she has been having upper abdominal pain, nausea, vomiting, and reports seeing blood in her vomit.  She has not been able to eat or drink much in the past couple days due to the symptoms. In terms of the recent pneumonia, she continues to have productive cough but has not noticed any fevers or chills.    [facility] ED Course: Upon arrival to the ED, patient is found to be saturating 99% on 5 L/min of supplemental oxygen heart rate and blood pressure.  We will for an albumin of 2.1 and CBC features a hemoglobin of 8.7.  Lactic acid was normal.  Lipase was normal.  Patient was treated with Reglan, Zofran, IV fluids, and IV Protonix in the ED.  She was transferred to [facility] for further evaluation and management.  Review of Systems:  All other systems reviewed and apart from HPI, are negative.  Past Medical History:  Diagnosis Date  . CHF (congestive heart failure) (HCC)   . Chronic back pain   . GERD (gastroesophageal reflux disease)   . Hiatal hernia   . Hyperlipidemia   . Hypertension   . Pelvic fracture (HCC) 12/12/2012  . Smoker     Past Surgical History:  Procedure Laterality Date  . ABDOMINAL HYSTERECTOMY    . APPENDECTOMY    . BALLOON DILATION N/A 06/17/2020   Procedure: BALLOON DILATION;  Surgeon: Malissa Hippo,  MD;  Location: [facility];  Service: Endoscopy;  Laterality: N/A;  . ESOPHAGOGASTRODUODENOSCOPY (EGD) WITH PROPOFOL N/A 01/17/2020   Procedure: ESOPHAGOGASTRODUODENOSCOPY (EGD) WITH PROPOFOL;  Surgeon: Dolores Frame, MD;  Location: [facility];  Service: Gastroenterology;  Laterality: N/A;  . ESOPHAGOGASTRODUODENOSCOPY (EGD) WITH PROPOFOL N/A 06/17/2020   Procedure: ESOPHAGOGASTRODUODENOSCOPY (EGD) WITH PROPOFOL;  Surgeon: Malissa Hippo, MD;  Location: [facility];  Service: Endoscopy;  Laterality: N/A;  . 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 HEMORROIDECTOMY    . LUMBAR SPINE SURGERY    . OOPHORECTOMY    . TONSILLECTOMY      Social History:   reports that she has quit smoking. Her smoking use included cigarettes. She smoked 0.50 packs per day. She quit smokeless tobacco use about 7 years ago. She reports that she does not drink alcohol and does not use drugs.  Allergies  Allergen Reactions  . Prozac [Fluoxetine Hcl] Other (See Comments)    Makes her fell crazy  . Zoloft [Sertraline Hcl]     Made her feel crazy--10/2013  . Nicoderm [Nicotine] Rash    Site rash    History reviewed. No pertinent family history.   Prior to Admission medications   Medication Sig Start Date End Date Taking? Authorizing Provider  amLODipine (NORVASC) 10 MG tablet Take 0.5 tablets (5 mg total) by mouth daily. 06/24/20   Vassie Loll, MD  atorvastatin (LIPITOR) 20 MG tablet TAKE 1 TABLET BY MOUTH EVERY NIGHT  AT BEDTIME 07/11/16   Dorena Bodoixon, Mary B, PA-C  doxycycline (VIBRA-TABS) 100 MG tablet Take 1 tablet (100 mg total) by mouth every 12 (twelve) hours for 5 days. 08/09/20 08/14/20  Cleora FleetJohnson, Clanford L, MD  famotidine (PEPCID) 10 MG tablet Take 1 tablet (10 mg total) by mouth daily. 08/09/20 09/08/20  Johnson, Clanford L, MD  furosemide (LASIX) 20 MG tablet Take 1 tablet (20 mg total) by mouth every other day. 08/12/20   Johnson, Clanford L, MD  gabapentin (NEURONTIN) 600 MG tablet Take 1 tablet (600 mg  total) by mouth 2 (two) times daily as needed (neuropathy pain). 08/09/20   Johnson, Clanford L, MD  guaiFENesin (MUCINEX) 600 MG 12 hr tablet Take 2 tablets (1,200 mg total) by mouth 2 (two) times daily for 5 days. 08/09/20 08/14/20  Johnson, Clanford L, MD  ipratropium-albuterol (DUONEB) 0.5-2.5 (3) MG/3ML SOLN Take 3 mLs by nebulization 3 (three) times daily. 08/09/20   Johnson, Clanford L, MD  Iron Combinations (NIFEREX) TABS Take 1 tablet by mouth daily. 06/24/20   Vassie LollMadera, Carlos, MD  metoprolol tartrate (LOPRESSOR) 25 MG tablet Take 1 tablet (25 mg total) by mouth 2 (two) times daily. 06/24/20   Vassie LollMadera, Carlos, MD  Multiple Vitamins-Minerals (MULTIVITAMIN WITH MINERALS) tablet Take 1 tablet by mouth daily.    [provider]  nystatin (MYCOSTATIN/NYSTOP) powder Apply topically 2 (two) times daily. 07/06/20   Sherryll BurgerShah, Pratik D, DO  potassium chloride SA (KLOR-CON) 10 MEQ tablet Take 1 tablet (10 mEq total) by mouth every other day. 08/12/20   Johnson, Clanford L, MD  QUEtiapine (SEROQUEL) 200 MG tablet Take 200 mg by mouth at bedtime. 10/22/19   [provider]  rOPINIRole (REQUIP) 3 MG tablet Take 3 mg by mouth at bedtime. 10/23/19   [provider]  saccharomyces boulardii (FLORASTOR) 250 MG capsule Take 1 capsule (250 mg total) by mouth 2 (two) times daily for 10 days. 08/09/20 08/19/20  Johnson, Clanford L, MD  sertraline (ZOLOFT) 25 MG tablet Take 25 mg by mouth daily. 05/04/20   [provider]  sucralfate (CARAFATE) 1 g tablet Take 1 tablet (1 g total) by mouth 2 (two) times daily. 01/31/20 03/01/20  Shon HaleEmokpae, Courage, MD    Physical Exam: Vitals:   08/13/20 0938  BP: (!) 173/83  Pulse: 70  Resp: 18  Temp: 98.6 F (37 C)  TempSrc: Oral  SpO2: 100%  Weight: 63.7 kg  Height: 5' (1.524 m)    Constitutional: NAD, calm  Eyes: PERTLA, lids and conjunctivae normal ENMT: Mucous membranes are moist. Posterior pharynx clear of any exudate or lesions.   Neck: supple,  no masses  Respiratory:  no wheezing, no crackles. No accessory muscle use.  Cardiovascular: S1 & S2 heard, regular rate and rhythm. No extremity edema.   Abdomen: Soft, no distension, tender in epigastrium without rebound pain or guarding. Bowel sounds active.  Musculoskeletal: no clubbing / cyanosis. No joint deformity upper and lower extremities.   Skin: no significant rashes, lesions, ulcers. Warm, dry, well-perfused. Neurologic: CN 2-12 grossly intact. Sensation intact. Moving all extremities.  Psychiatric: Alert and oriented to person, place, and situation. Pleasant and cooperative.    Labs and Imaging on Admission: I have personally reviewed following labs and imaging studies  CBC: Recent Labs  Lab 08/07/20 0442 08/08/20 0443 08/09/20 0415  WBC 9.1 8.5 5.5  NEUTROABS  --  6.2 3.2  HGB 9.2* 7.1* 7.5*  HCT 32.0* 24.3* 25.6*  MCV 91.7 90.0 88.6  PLT 501*  410* 440*   Basic Metabolic Panel: Recent Labs  Lab 08/07/20 0442 08/07/20 0649 08/08/20 0443 08/09/20 0415  NA 132*  --  136 134*  K 4.6  --  4.6 4.7  CL 100  --  106 105  CO2 26  --  26 26  GLUCOSE 123*  --  92 90  BUN 31*  --  28* 17  CREATININE 1.01*  --  0.79 0.59  CALCIUM 7.9*  --  7.7* 7.8*  MG  --  1.8 1.8 1.8   GFR: Estimated Creatinine Clearance: 49.1 mL/min (by C-G formula based on SCr of 0.59 mg/dL). Liver Function Tests: Recent Labs  Lab 08/07/20 0442 08/08/20 0443 08/09/20 0415  AST 12* 10* 11*  ALT 13 11 11   ALKPHOS 110 90 82  BILITOT 0.4 0.4 0.3  PROT 6.5 5.1* 5.3*  ALBUMIN 2.3* 1.7* 1.8*   No results for input(s): LIPASE, AMYLASE in the last 168 hours. No results for input(s): AMMONIA in the last 168 hours. Coagulation Profile: Recent Labs  Lab 08/07/20 0442  INR 1.1   Cardiac Enzymes: No results for input(s): CKTOTAL, CKMB, CKMBINDEX, TROPONINI in the last 168 hours. BNP (last 3 results) No results for input(s): PROBNP in the last 8760 hours. HbA1C: No results for input(s):  HGBA1C in the last 72 hours. CBG: No results for input(s): GLUCAP in the last 168 hours. Lipid Profile: No results for input(s): CHOL, HDL, LDLCALC, TRIG, CHOLHDL, LDLDIRECT in the last 72 hours. Thyroid Function Tests: No results for input(s): TSH, T4TOTAL, FREET4, T3FREE, THYROIDAB in the last 72 hours. Anemia Panel: No results for input(s): VITAMINB12, FOLATE, FERRITIN, TIBC, IRON, RETICCTPCT in the last 72 hours. Urine analysis:    Component Value Date/Time   COLORURINE YELLOW 06/30/2020 0129   APPEARANCEUR HAZY (A) 06/30/2020 0129   LABSPEC 1.013 06/30/2020 0129   PHURINE 5.0 06/30/2020 0129   GLUCOSEU NEGATIVE 06/30/2020 0129   HGBUR MODERATE (A) 06/30/2020 0129   BILIRUBINUR NEGATIVE 06/30/2020 0129   KETONESUR NEGATIVE 06/30/2020 0129   PROTEINUR NEGATIVE 06/30/2020 0129   NITRITE NEGATIVE 06/30/2020 0129   LEUKOCYTESUR LARGE (A) 06/30/2020 0129   Sepsis Labs: @LABRCNTIP (procalcitonin:4,lacticidven:4) ) Recent Results (from the past 240 hour(s))  Resp Panel by RT-PCR (Flu A&B, Covid) Nasopharyngeal Swab     Status: None   Collection Time: 08/07/20  5:41 AM   Specimen: Nasopharyngeal Swab; Nasopharyngeal(NP) swabs in vial transport medium  Result Value Ref Range Status   SARS Coronavirus 2 by RT PCR NEGATIVE NEGATIVE Final    Comment: (NOTE) SARS-CoV-2 target nucleic acids are NOT DETECTED.  The SARS-CoV-2 RNA is generally detectable in upper respiratory specimens during the acute phase of infection. The lowest concentration of SARS-CoV-2 viral copies this assay can detect is 138 copies/mL. A negative result does not preclude SARS-Cov-2 infection and should not be used as the sole basis for treatment or other patient management decisions. A negative result may occur with  improper specimen collection/handling, submission of specimen other than nasopharyngeal swab, presence of viral mutation(s) within the areas targeted by this assay, and inadequate number of  viral copies(<138 copies/mL). A negative result must be combined with clinical observations, patient history, and epidemiological information. The expected result is Negative.  Fact Sheet for Patients:   Fact Sheet for Healthcare Providers:  08/09/20  This test is no t yet approved or cleared by the [employer] FDA and  has been authorized for detection and/or diagnosis of SARS-CoV-2 by FDA under an Emergency  Use Authorization (EUA). This EUA will remain  in effect (meaning this test can be used) for the duration of the COVID-19 declaration under Section 564(b)(1) of the Act, 21 U.S.C.section 360bbb-3(b)(1), unless the authorization is terminated  or revoked sooner.       Influenza A by PCR NEGATIVE NEGATIVE Final   Influenza B by PCR NEGATIVE NEGATIVE Final    Comment: (NOTE) The Xpert Xpress SARS-CoV-2/FLU/RSV plus assay is intended as an aid in the diagnosis of influenza from Nasopharyngeal swab specimens and should not be used as a sole basis for treatment. Nasal washings and aspirates are unacceptable for Xpert Xpress SARS-CoV-2/FLU/RSV testing.  Fact Sheet for Patients: [employer]com  Fact Sheet for Healthcare Providers: SeriousBroker.it  This test is not yet approved or cleared by the [place] FDA and has been authorized for detection and/or diagnosis of SARS-CoV-2 by FDA under an Emergency Use Authorization (EUA). This EUA will remain in effect (meaning this test can be used) for the duration of the COVID-19 declaration under Section 564(b)(1) of the Act, 21 U.S.C. section 360bbb-3(b)(1), unless the authorization is terminated or revoked.  Performed at [facility], [address], [place], [place] [address]   MRSA PCR Screening     Status: Abnormal   Collection Time: 08/07/20 10:57 AM   Specimen: Nasopharyngeal  Result Value  Ref Range Status   MRSA by PCR POSITIVE (A) NEGATIVE Final    Comment:        The GeneXpert MRSA Assay (FDA approved for NASAL specimens only), is one component of a comprehensive MRSA colonization surveillance program. It is not intended to diagnose MRSA infection nor to guide or monitor treatment for MRSA infections. RESULT CALLED TO, READ BACK BY AND VERIFIED WITH: CALDER @ 0845 ON [id number] BY HENDERSON L. Performed at [facility], [address], [place], [place] [address]      Radiological Exams on Admission: No results found.   Assessment/Plan   1. Hematemesis  - Patient with hx of Roux-en-Y in 2003 reports several days of upper abdominal pain and N/V with coffee-ground emesis  - She is hemodynamically stable with initial Hgb 8.7 (7.5 on 08/09/20); CT in ED with mild fluid-distension of esophagus  - She had EGD on 06/17/20 with ulceration and severe stenosis at Roux-en-Y anastomosis   - GI consulting and much appreciated  - Continue bowel-rest, IV PPI q12h, serial H&H, transfuse if needed    2. COPD; current smoker  - Stable, continue as-needed duonebs  - Not interested in quitting/cutting back on cigarettes at this time    3. Chronic diastolic CHF  - Appears compensated  - Continue beta-blocker as tolerated, hold Lasix for now given N/V   4. Pneumonia  - Treated with IV antibiotics at [facility] recently and discharged 5/29 with 5 days of doxycycline  - She appears stable, CT from ED with radiographic improvement  - Continue doxycycline    5. Anxiety  - Continue Zoloft and Seroquel    6. Hypertension  - Continue Norvasc and metoprolol as tolerated    DVT prophylaxis: SCDs  Code Status: DNR  Level of Care: Level of care: Telemetry Family Communication: None available  Disposition Plan:  Patient is from: SNF  Anticipated d/c is to: SNF  Anticipated d/c date is: 08/15/20  Patient currently: Pending GI consultation, serial H&H  Consults called: Message was sent  to GI with request for routine consult  Admission status: Inpatient    Briscoe Deutscher, MD [facility]  08/13/2020, 10:44 PM

## 2020-08-13 NOTE — Progress Notes (Signed)
Received new admit from [facility] in a stretcher at 2126. Patient is alert, oriented to self, time and place but also noted with intermittent confusion. On 1L via [place], sats in the upper 90's. Denies pain or discomfort at this time. Patient was oriented to room and call bell placed within reach.

## 2020-08-14 ENCOUNTER — Inpatient Hospital Stay (HOSPITAL_COMMUNITY): Payer: Medicare Other | Admitting: Certified Registered"

## 2020-08-14 ENCOUNTER — Encounter (HOSPITAL_COMMUNITY)
Admission: AD | Disposition: A | Discharge status: Skilled Nursing Facility | Payer: Self-pay | Attending: Family Medicine

## 2020-08-14 ENCOUNTER — Other Ambulatory Visit: Payer: Self-pay

## 2020-08-14 ENCOUNTER — Encounter (HOSPITAL_COMMUNITY): Payer: Self-pay | Admitting: Family Medicine

## 2020-08-14 HISTORY — PX: ESOPHAGOGASTRODUODENOSCOPY: SHX5428

## 2020-08-14 HISTORY — PX: BIOPSY: SHX5522

## 2020-08-14 LAB — TYPE AND SCREEN
ABO/RH(D): O POS
Antibody Screen: NEGATIVE

## 2020-08-14 LAB — HEMOGLOBIN AND HEMATOCRIT, BLOOD
HCT: 33.5 % — ABNORMAL LOW (ref 36.0–46.0)
HCT: 28 % — ABNORMAL LOW (ref 36.0–46.0)
Hemoglobin: 9.7 g/dL — ABNORMAL LOW (ref 12.0–15.0)
Hemoglobin: 8.2 g/dL — ABNORMAL LOW (ref 12.0–15.0)

## 2020-08-14 LAB — SARS CORONAVIRUS 2 (TAT 6-24 HRS): SARS Coronavirus 2: NEGATIVE

## 2020-08-14 SURGERY — EGD (ESOPHAGOGASTRODUODENOSCOPY)
Anesthesia: General

## 2020-08-14 MED ORDER — ADULT MULTIVITAMIN W/MINERALS CH
1.0000 | ORAL_TABLET | Freq: Every day | ORAL | Status: DC
  Administered 2020-08-15 – 2020-08-18 (×4): 1 via ORAL
  Filled 2020-08-14 (×4): qty 1

## 2020-08-14 MED ORDER — ENSURE ENLIVE PO LIQD
237.0000 mL | Freq: Two times a day (BID) | ORAL | Status: DC
  Administered 2020-08-14 – 2020-08-18 (×6): 237 mL via ORAL

## 2020-08-14 MED ORDER — PROPOFOL 10 MG/ML IV BOLUS
INTRAVENOUS | Status: AC
  Filled 2020-08-14: qty 20

## 2020-08-14 MED ORDER — PROPOFOL 10 MG/ML IV BOLUS
INTRAVENOUS | Status: DC | PRN
  Administered 2020-08-14: 30 mg via INTRAVENOUS
  Administered 2020-08-14: 20 mg via INTRAVENOUS
  Administered 2020-08-14: 30 mg via INTRAVENOUS

## 2020-08-14 MED ORDER — SODIUM CHLORIDE 0.9 % IV SOLN
INTRAVENOUS | Status: DC
  Administered 2020-08-14: 500 mL via INTRAVENOUS

## 2020-08-14 MED ORDER — BOOST / RESOURCE BREEZE PO LIQD CUSTOM
1.0000 | ORAL | Status: DC
  Administered 2020-08-14 – 2020-08-15 (×2): 1 via ORAL

## 2020-08-14 MED ORDER — LIDOCAINE 2% (20 MG/ML) 5 ML SYRINGE
INTRAMUSCULAR | Status: DC | PRN
  Administered 2020-08-14: 60 mg via INTRAVENOUS

## 2020-08-14 NOTE — Transfer of Care (Signed)
Immediate Anesthesia Transfer of Care Note  Patient: Brenda Hopkins  Procedure(s) Performed: ESOPHAGOGASTRODUODENOSCOPY (EGD) (N/A ) BIOPSY  Patient Location: PACU  Anesthesia Type:MAC  Level of Consciousness: awake, alert  and oriented  Airway & Oxygen Therapy: Patient Spontanous Breathing and Patient connected to face mask oxygen  Post-op Assessment: Report given to RN and Post -op Vital signs reviewed and stable  Post vital signs: Reviewed and stable  Last Vitals:  Vitals Value Taken Time  BP 128/79 08/14/20 1112  Temp    Pulse 86 08/14/20 1114  Resp 15 08/14/20 1114  SpO2 100 % 08/14/20 1114  Vitals shown include unvalidated device data.  Last Pain:  Vitals:   08/14/20 1025  TempSrc:   PainSc: 4       Patients Stated Pain Goal: 0 (47/82/95 6213)  Complications: No complications documented.

## 2020-08-14 NOTE — Brief Op Note (Signed)
08/13/2020 - 08/14/2020  11:15 AM  PATIENT:  Brenda Hopkins  80 y.o. female  PRE-OPERATIVE DIAGNOSIS:  anemia, coffee ground emesis  POST-OPERATIVE DIAGNOSIS:  anastamosis ulcer, gastritis  PROCEDURE:  Procedure(s): ESOPHAGOGASTRODUODENOSCOPY (EGD) (N/A) BIOPSY  SURGEON:  Surgeon(s) and Role:    * Santhiago Collingsworth, MD - Primary  Findings ----------- -EGD showed large, deep clean-based ulcer at anastomosis.  Also showed gastritis.  Biopsies taken.  Recommendations -------------------------- -Continue IV twice daily PPI while in hospital. -Recommend Protonix 40 mg twice a day for at least 3 months and then Protonix 40 mg once a day indefinitely. -Start full liquid diet and slowly advance as tolerated -Okay to transfer back to [facility]. -Follow-up with primary GI at [facility] after discharge -Avoid NSAIDs -GI will sign off.  Call us back if needed  Kathi Der MD, FACP 08/14/2020, 11:17 AM  Contact #  [phone]

## 2020-08-14 NOTE — Progress Notes (Addendum)
PROGRESS NOTE    Brenda Hopkins  ZOX:[id number] DOB: 07/13/1940 DOA: 08/13/2020 PCP: Shawnie DapperMann, Benjamin L, PA-C   No chief complaint on file.   Brief Narrative: Brenda DrapeBrenda Gail Weill is Britt Petroni 80 y.o. female with medical history significant for chronic diastolic CHF, emphysema, current smoker, anxiety, Roux-en-Y bypass in 2003, and recent admission with pneumonia and acute hypoxic respiratory failure, now presenting from her SNF for evaluation of upper abdominal pain, nausea, vomiting, and hematemesis.  Patient was discharged from [facility] on 08/09/2020 after treatment for pneumonia, but reports that she has been having upper abdominal pain, nausea, vomiting, and reports seeing blood in her vomit.  She has not been able to eat or drink much in the past couple days due to the symptoms. In terms of the recent pneumonia, she continues to have productive cough but has not noticed any fevers or chills.    [facility] ED Course: Upon arrival to the ED, patient is found to be saturating 99% on 5 L/min of supplemental oxygen heart rate and blood pressure.  We will for an albumin of 2.1 and CBC features Bentli Llorente hemoglobin of 8.7.  Lactic acid was normal.  Lipase was normal.  Patient was treated with Reglan, Zofran, IV fluids, and IV Protonix in the ED.  [facility] and management.  Assessment & Plan:   Principal Problem:   Coffee ground emesis Active Problems:   Hypertension   Smoker   Generalized anxiety disorder   Gi Tract---Marginal ulcer   Chronic diastolic CHF (congestive heart failure) (HCC)   Anemia   Emphysema, unspecified (HCC)  1. Hematemesis  - Patient with hx of Roux-en-Y in 2003 reports several days of upper abdominal pain and N/V with coffee-ground emesis  - CT in ED with mild fluid distension of esophagus - s/p EGD 6/3 with gastritis, roux en y gastrojejunostomy with gastrojejunal anastomosis char by ulceration (biopsied).   Recommending full liquid diet. Per GI, recommending IV PPI BID while in hospital, then PO protonix BID for 3 months, then protonix 40 mg once Rafal Archuleta day indefinitely.  FLD and ADAT.  Recommending follow up with primary GI after discharge.    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 COPD; current smoker  - Stable, continue as-needed duonebs  - Not interested in quitting/cutting back on cigarettes at this time    3. Chronic diastolic CHF  - Appears compensated  - Continue beta-blocker as tolerated, hold Lasix for now given N/V   4. Pneumonia  - Treated [facility] and discharged 5/29 with 5 days of doxycycline  - She appears stable, CT from ED with radiographic improvement  - Continue doxycycline, last dose should be tonight 6/3  5. Anxiety  - Continue Zoloft and Seroquel    6. Hypertension  - Continue Norvasc and metoprolol as tolerated   [facility]  DVT prophylaxis: SCD Code Status: DNR Family Communication: none at bedside Disposition:   Status is: Inpatient  Remains inpatient appropriate because:Inpatient level of care appropriate due to severity of illness   Dispo: The patient is from: Home              Anticipated d/c is to: Home              Patient currently is not medically stable to d/c.   Difficult to place patient  No       Consultants:   GI  Procedures)  EGD  Antimicrobials:  Anti-infectives (From admission, onward)   Start     Dose/Rate Route Frequency Ordered Stop   08/13/20 2300  doxycycline (VIBRA-TABS) tablet 100 mg        100 mg Oral Every 12 hours 08/13/20 2206 08/15/20 0959         Subjective: Just had BM askign for help from nursing  Objective: Vitals:   08/14/20 1113 08/14/20 1123 08/14/20 1133 08/14/20 1200  BP: 128/79 (!) 151/90 (!) 168/86 (!) 142/94  Pulse: 83 83 83 79  Resp: 17 (!) 28 (!) 25 20  Temp: 98.4 F (36.9  C)   98.9 F (37.2 C)  TempSrc: Oral   Oral  SpO2: 99% 95% 93% 94%  Weight:      Height:        Intake/Output Summary (Last 24 hours) at 08/14/2020 1814 Last data filed at 08/14/2020 1114 Gross per 24 hour  Intake 100 ml  Output 900 ml  Net -800 ml   Filed Weights   08/13/20 0938 08/14/20 0500  Weight: 63.7 kg 65 kg    Examination:  General exam: Appears calm and comfortable  Respiratory system: unlabored Cardiovascular system: rRR  Gastrointestinal system: Abdomen is nondistended, soft and nontender. Central nervous system: Alert and oriented. No focal neurological deficits. Extremities: no LEE Skin: No rashes, lesions or ulcers Psychiatry: Judgement and insight appear normal. Mood & affect appropriate.     Data Reviewed: I have personally reviewed following labs and imaging studies  CBC: Recent Labs  Lab 08/08/20 0443 08/09/20 0415 08/13/20 2305 08/14/20 1349  WBC 8.5 5.5  --   --   NEUTROABS 6.2 3.2  --   --   HGB 7.1* 7.5* 8.6* 9.7*  HCT 24.3* 25.6* 29.2* 33.5*  MCV 90.0 88.6  --   --   PLT 410* 440*  --   --     Basic Metabolic Panel: Recent Labs  Lab 08/08/20 0443 08/09/20 0415  NA 136 134*  K 4.6 4.7  CL 106 105  CO2 26 26  GLUCOSE 92 90  BUN 28* 17  CREATININE 0.79 0.59  CALCIUM 7.7* 7.8*  MG 1.8 1.8    GFR: Estimated Creatinine Clearance: 49.6 mL/min (by C-G formula based on SCr of 0.59 mg/dL).  Liver Function Tests: Recent Labs  Lab 08/08/20 0443 08/09/20 0415  AST 10* 11*  ALT 11 11  ALKPHOS 90 82  BILITOT 0.4 0.3  PROT 5.1* 5.3*  ALBUMIN 1.7* 1.8*    CBG: No results for input(s): GLUCAP in the last 168 hours.   Recent Results (from the past 240 hour(s))  Resp Panel by RT-PCR (Flu Kamiya Acord&B, Covid) Nasopharyngeal Swab     Status: None   Collection Time: 08/07/20  5:41 AM   Specimen: Nasopharyngeal Swab; Nasopharyngeal(NP) swabs in vial transport medium  Result Value Ref Range Status   SARS Coronavirus 2 by RT PCR NEGATIVE  NEGATIVE Final    Comment: (NOTE) SARS-CoV-2 target nucleic acids are NOT DETECTED.  The SARS-CoV-2 RNA is generally detectable in upper respiratory specimens during the acute phase of infection. The lowest concentration of SARS-CoV-2 viral copies this assay can detect is 138 copies/mL. Shayann Garbutt negative result does not preclude SARS-Cov-2 infection and should not be used as the sole basis for treatment or other patient management decisions. Mirah Nevins negative result may occur with  improper specimen collection/handling, submission of specimen other than nasopharyngeal  swab, presence of viral mutation(s) within the areas targeted by this assay, and inadequate number of viral copies(<138 copies/mL). Maleiah Dula negative result must be combined with clinical observations, patient history, and epidemiological information. The expected result is Negative.  Fact Sheet for Patients:  BloggerCourse.com  Fact Sheet for Healthcare Providers:  SeriousBroker.it  This test is no t yet approved or cleared by the [place] FDA and  has been authorized for detection and/or diagnosis of SARS-CoV-2 by FDA under an Emergency Use Authorization (EUA). This EUA will remain  in effect (meaning this test can be used) for the duration of the COVID-19 declaration under Section 564(b)(1) of the Act, 21 U.S.C.section 360bbb-3(b)(1), unless the authorization is terminated  or revoked sooner.       Influenza Larisa Lanius by PCR NEGATIVE NEGATIVE Final   Influenza B by PCR NEGATIVE NEGATIVE Final    Comment: (NOTE) The Xpert Xpress SARS-CoV-2/FLU/RSV plus assay is intended as an aid in the diagnosis of influenza from Nasopharyngeal swab specimens and should not be used as Alayia Meggison sole basis for treatment. Nasal washings and aspirates are unacceptable for Xpert Xpress SARS-CoV-2/FLU/RSV testing.  Fact Sheet for Patients: BloggerCourse.com  Fact Sheet for Healthcare  Providers: SeriousBroker.it  This test is not yet approved or cleared by the [place] FDA and has been authorized for detection and/or diagnosis of SARS-CoV-2 by FDA under an Emergency Use Authorization (EUA). This EUA will remain in effect (meaning this test can be used) for the duration of the COVID-19 declaration under Section 564(b)(1) of the Act, 21 U.S.C. section 360bbb-3(b)(1), unless the authorization is terminated or revoked.  Performed at [facility], [address] [place], [place] [address]   MRSA PCR Screening     Status: Abnormal   Collection Time: 08/07/20 10:57 AM   Specimen: Nasopharyngeal  Result Value Ref Range Status   MRSA by PCR POSITIVE (Keary Hanak) NEGATIVE Final    Comment:        The GeneXpert MRSA Assay (FDA approved for NASAL specimens only), is one component of Leolia Vinzant comprehensive MRSA colonization surveillance program. It is not intended to diagnose MRSA infection nor to guide or monitor treatment for MRSA infections. RESULT CALLED TO, READ BACK BY AND VERIFIED WITH: CALDER @ 0845 ON [id number] BY HENDERSON L. Performed at [facility], [address] [place], [place] [address]   SARS CORONAVIRUS 2 (TAT 6-24 HRS) Nasopharyngeal Nasopharyngeal Swab     Status: None   Collection Time: 08/13/20 10:44 PM   Specimen: Nasopharyngeal Swab  Result Value Ref Range Status   SARS Coronavirus 2 NEGATIVE NEGATIVE Final    Comment: (NOTE) SARS-CoV-2 target nucleic acids are NOT DETECTED.  The SARS-CoV-2 RNA is generally detectable in upper and lower respiratory specimens during the acute phase of infection. Negative results do not preclude SARS-CoV-2 infection, do not rule out co-infections with other pathogens, and should not be used as the sole basis for treatment or other patient management decisions. Negative results must be combined with clinical observations, patient history, and epidemiological information. The expected result is  Negative.  Fact Sheet for Patients: HairSlick.no  Fact Sheet for Healthcare Providers: quierodirigir.com  This test is not yet approved or cleared by the [place] FDA and  has been authorized for detection and/or diagnosis of SARS-CoV-2 by FDA under an Emergency Use Authorization (EUA). This EUA will remain  in effect (meaning this test can be used) for the duration of the COVID-19 declaration under Se ction 564(b)(1) of the Act, 21 U.S.C.  section 360bbb-3(b)(1), unless the authorization is terminated or revoked sooner.  Performed at [facility], [address], [place], [place] [address]          Radiology Studies: No results found.      Scheduled Meds: . amLODipine  5 mg Oral Daily  . atorvastatin  20 mg Oral q1800  . doxycycline  100 mg Oral Q12H  . feeding supplement  1 Container Oral Q24H  . feeding supplement  237 mL Oral BID BM  . guaiFENesin  1,200 mg Oral BID  . metoprolol tartrate  25 mg Oral BID  . multivitamin with minerals  1 tablet Oral Daily  . pantoprazole (PROTONIX) IV  40 mg Intravenous Q12H  . QUEtiapine  200 mg Oral QHS  . rOPINIRole  3 mg Oral QHS  . saccharomyces boulardii  250 mg Oral BID  . sertraline  25 mg Oral Daily   Continuous Infusions: . sodium chloride       LOS: 1 day    Time spent: over 30 min    Lacretia Nicks, MD [facility]   To contact the attending provider between 7A-7P or the covering provider during after hours 7P-7A, please log into the web site [email or web address] and access using universal Velma password for that web site. If you do not have the password, please call the hospital operator.  08/14/2020, 6:14 PM

## 2020-08-14 NOTE — Progress Notes (Signed)
Initial Nutrition Assessment  DOCUMENTATION CODES:   Not applicable  INTERVENTION:  - will order Boost Breeze once/day, each supplement provides 250 kcal and 9 grams of protein. - will order Ensure Enlive BID, each supplement provides 350 kcal and 20 grams of protein. - complete NFPE when feasible.  - diet advancement as medically feasible.    NUTRITION DIAGNOSIS:   Inadequate oral intake related to acute illness,nausea,vomiting as evidenced by per patient/family report.  GOAL:   Patient will meet greater than or equal to 90% of their needs  MONITOR:   PO intake,Supplement acceptance,Diet advancement,Labs,Weight trends,I & O's  REASON FOR ASSESSMENT:   Malnutrition Screening Tool  ASSESSMENT:   80 y.o. female with medical history of CHF, emphysema, current smoker, anxiety, Roux-en-Y in 2003, and recent admission at [facility] for PNA and acute hypoxic respiratory failure (d/c on 5/29). She presented from SNF due to upper abdominal pain, N/V, and hematemesis. Reported inability to eat or drink much since d/c d/t symptoms. She continues to have a productive cough but no fevers or chills.  Patient out of the room to Endoscopy for EGD. Diet changed from CLD to NPO today at 0905 and advanced to FLD ~10 minutes ago. No intakes documented from while on CLD.  She has not been seen by a [place] RD at any time in the past. MST score of 2.0.  Weight today is 143 lb and weight yesterday documented as 140 lb. Weight has been stable since 06/30/20. Weight on 01/29/20 was 175 lb. This indicates 32 lb weight loss (18% body weight) in the past 7 months; significant for time frame, if accurate. "Generalized" documented in the edema section of flow sheet.  Per notes: - hematemesis - EGD on 4/6 showed ulceration and severe stenosis at roux-en-y anastomosis - PNA--improving - from SNF with likely return to SNF at d/c   Labs reviewed; Na: 134 mmol/l, Ca: 7.8 mg/dl. Medications reviewed;  40 mg IV protonix BID, 250 mg florastor BID.     NUTRITION - FOCUSED PHYSICAL EXAM:  unable to complete at this time.   Diet Order:   Diet Order            Diet full liquid Room service appropriate? Yes; Fluid consistency: Thin  Diet effective now                 EDUCATION NEEDS:   Not appropriate for education at this time  Skin:  Skin Assessment: Reviewed RN Assessment  Last BM:  PTA/unknown  Height:   Ht Readings from Last 1 Encounters:  08/13/20 5' (1.524 m)    Weight:   Wt Readings from Last 1 Encounters:  08/14/20 65 kg    Estimated Nutritional Needs:  Kcal:  1400-1600 kcal Protein:  65-80 grams Fluid:  >/= 1.6 L/day      Trenton Gammon, MS, RD, LDN, CNSC Inpatient Clinical Dietitian RD pager # available in AMION  After hours/weekend pager # available in [facility]

## 2020-08-14 NOTE — Anesthesia Postprocedure Evaluation (Signed)
Anesthesia Post Note  Patient: Brenda Hopkins  Procedure(s) Performed: ESOPHAGOGASTRODUODENOSCOPY (EGD) (N/A ) BIOPSY     Patient location during evaluation: PACU Anesthesia Type: MAC Level of consciousness: awake and alert Pain management: pain level controlled Vital Signs Assessment: post-procedure vital signs reviewed and stable Respiratory status: spontaneous breathing, nonlabored ventilation, respiratory function stable and patient connected to nasal cannula oxygen Cardiovascular status: stable and blood pressure returned to baseline Postop Assessment: no apparent nausea or vomiting Anesthetic complications: no   No complications documented.  Last Vitals:  Vitals:   08/14/20 1133 08/14/20 1200  BP: (!) 168/86 (!) 142/94  Pulse: 83 79  Resp: (!) 25 20  Temp:  37.2 C  SpO2: 93% 94%    Last Pain:  Vitals:   08/14/20 1200  TempSrc: Oral  PainSc:                  Tiajuana Amass

## 2020-08-14 NOTE — Progress Notes (Signed)
Patient is refusing to have labs drawn. Another attempt will be made later in the day.

## 2020-08-14 NOTE — Anesthesia Preprocedure Evaluation (Addendum)
Anesthesia Evaluation  Patient identified by MRN, date of birth, ID band Patient awake    Reviewed: Allergy & Precautions, H&P , NPO status , Patient's Chart, lab work & pertinent test results, reviewed documented beta blocker date and time   Airway Mallampati: III  TM Distance: >3 FB Neck ROM: full    Dental no notable dental hx.    Pulmonary neg pulmonary ROS, former smoker,    Pulmonary exam normal breath sounds clear to auscultation       Cardiovascular Exercise Tolerance: Good hypertension, +CHF   Rhythm:regular Rate:Normal     Neuro/Psych PSYCHIATRIC DISORDERS Anxiety negative neurological ROS     GI/Hepatic Neg liver ROS, hiatal hernia, PUD, GERD  Medicated,  Endo/Other  negative endocrine ROS  Renal/GU ARFRenal disease  negative genitourinary   Musculoskeletal   Abdominal   Peds  Hematology  (+) Blood dyscrasia, anemia ,   Anesthesia Other Findings   Reproductive/Obstetrics negative OB ROS                             Anesthesia Physical  Anesthesia Plan  ASA: II  Anesthesia Plan: General   Post-op Pain Management:    Induction:   PONV Risk Score and Plan: 3 and Propofol infusion, Treatment may vary due to age or medical condition and TIVA  Airway Management Planned: Natural Airway  Additional Equipment:   Intra-op Plan:   Post-operative Plan:   Informed Consent: I have reviewed the patients History and Physical, chart, labs and discussed the procedure including the risks, benefits and alternatives for the proposed anesthesia with the patient or authorized representative who has indicated his/her understanding and acceptance.   Patient has DNR.  Discussed DNR with power of attorney and Suspend DNR.   Dental Advisory Given and Consent reviewed with POA  Plan Discussed with: CRNA  Anesthesia Plan Comments:        Anesthesia Quick Evaluation

## 2020-08-14 NOTE — Progress Notes (Addendum)
Patient has an order for cardiac monitoring but she has been refusing to have it put in place. She continues to experience intermittent confusion and will yell and make attempts to grab or hit staff. Central monitoring was made aware of patients refusal. Staff will attempt again at a later time when patient is more receptive to interventions.

## 2020-08-14 NOTE — Op Note (Signed)
[facility] Patient Name: Brenda Hopkins Procedure Date: 08/14/2020 MRN: [id number] Attending MD: Kathi Der , MD Date of Birth: 1940-07-15 CSN: [id number] Age: 80 Admit Type: Inpatient Procedure:                Upper GI endoscopy Indications:              Coffee-ground emesis Providers:                Kathi Der, MD, Estella Husk RN, RN, Rosilyn Mings, Technician, [facility], CRNA Referring MD:              Medicines:                Sedation Administered by an Anesthesia Professional Complications:            No immediate complications. Estimated Blood Loss:     Estimated blood loss was minimal. Procedure:                Pre-Anesthesia Assessment:                           - Prior to the procedure, a History and Physical                            was performed, and patient medications and                            allergies were reviewed. The patient's tolerance of                            previous anesthesia was also reviewed. The risks                            and benefits of the procedure and the sedation                            options and risks were discussed with the patient.                            All questions were answered, and informed consent                            was obtained. Prior Anticoagulants: The patient has                            taken no previous anticoagulant or antiplatelet                            agents. ASA Grade Assessment: III - A patient with                            severe systemic disease. After reviewing the risks  and benefits, the patient was deemed in                            satisfactory condition to undergo the procedure.                           After obtaining informed consent, the endoscope was                            passed under direct vision. Throughout the                            procedure, the patient's blood  pressure, pulse, and                            oxygen saturations were monitored continuously. The                            GIF-H190 ([id number]) Olympus gastroscope was                            introduced through the mouth, and advanced to the                            efferent jejunal loop. The upper GI endoscopy was                            accomplished without difficulty. The patient                            tolerated the procedure well. Scope In: Scope Out: Findings:      A non-obstructing Schatzki ring was found at the gastroesophageal       junction.      A medium amount of food (residue) was found in the gastric body.      Scattered mild inflammation characterized by congestion (edema),       erosions and erythema was found in the entire examined stomach.      The cardia and gastric fundus were normal on retroflexion.      Evidence of a Roux-en-Y gastrojejunostomy was found. The gastrojejunal       anastomosis was characterized by a deep, clean based large ulceration.       This was traversed. Biopsies were taken from gastric side of the       anastomosis with a cold forceps for histology.      The examined small bowel was normal. Impression:               - Non-obstructing Schatzki ring.                           - A medium amount of food (residue) in the stomach.                           - Gastritis.                           -  Roux-en-Y gastrojejunostomy with gastrojejunal                            anastomosis characterized by ulceration. Biopsied.                           - Normal examined jejunum. Moderate Sedation:      Moderate (conscious) sedation was personally administered by an       anesthesia professional. The following parameters were monitored: oxygen       saturation, heart rate, blood pressure, and response to care. Recommendation:           - Return patient to hospital ward for ongoing care.                           - Full liquid diet.                            - Continue present medications.                           - Await pathology results. Procedure Code(s):        --- Professional ---                           [phone], Esophagogastroduodenoscopy, flexible,                            transoral; with biopsy, single or multiple Diagnosis Code(s):        --- Professional ---                           K22.2, Esophageal obstruction                           K29.70, Gastritis, unspecified, without bleeding                           Z98.0, Intestinal bypass and anastomosis status                           K92.0, Hematemesis CPT copyright 2019 American Medical Association. All rights reserved. The codes documented in this report are preliminary and upon coder review may  be revised to meet current compliance requirements. Kathi Der, MD Kathi Der, MD 08/14/2020 11:14:14 AM Number of Addenda: 0

## 2020-08-14 NOTE — Plan of Care (Signed)

## 2020-08-14 NOTE — Consult Note (Signed)
Referring Provider: [facility] Primary Care Physician:  Samuella Bruin Primary Gastroenterologist:  Gentry Fitz  Reason for Consultation:  Coffee ground emesis, anemia   HPI: Brenda Hopkins is a 80 y.o. female with past medical history of Roux-en-Y gastrojejunostomy (2003), gastrojejunal anastomosis with severe stenosis, anastomotic ulcer.  chronic CHF, PNA, acute respiratory failure presenting for consultation of coffee ground emesis and anemia.  Patient [facility] from [facility] to [facility] with coffee ground emesis and was subsequently transferred to [facility]. Patient reports having epigastric pain, nausea, and vomiting for several days.  She is currently in mild acute distress due to abdominal pain.  She denies melena or hematochezia.  She has had poor appetite since discharge 5/29 with PNA.  Has had recent cough but denies dyspnea. Denies chest pain.  Denies NSAID, ASA, and blood thinner use.  EGD 06/2020: 4 cm hiatal hernia, Roux-en-Y gastrojejunostomy with gastrojejunal anastomosis characterized by ulceration and severe stenosis. Stricture dilated to 12 mm with balloon dilator.  EGD 01/2020: 2 cm hiatal hernia. Roux-en-Y gastrojejunostomy with gastrojejunal anastomosis characterized by congestion and erythema with a large anastomotic ulcer (jejunal side).  Past Medical History:  Diagnosis Date  . CHF (congestive heart failure) (HCC)   . Chronic back pain   . GERD (gastroesophageal reflux disease)   . Hiatal hernia   . Hyperlipidemia   . Hypertension   . Pelvic fracture (HCC) 12/12/2012  . Smoker     Past Surgical History:  Procedure Laterality Date  . ABDOMINAL HYSTERECTOMY    . APPENDECTOMY    . BALLOON DILATION N/A 06/17/2020   Procedure: BALLOON DILATION;  Surgeon: Malissa Hippo, MD;  Location: [facility];  Service: Endoscopy;  Laterality: N/A;  . ESOPHAGOGASTRODUODENOSCOPY (EGD) WITH PROPOFOL N/A 01/17/2020   Procedure: ESOPHAGOGASTRODUODENOSCOPY (EGD) WITH PROPOFOL;   Surgeon: Dolores Frame, MD;  Location: [facility];  Service: Gastroenterology;  Laterality: N/A;  . ESOPHAGOGASTRODUODENOSCOPY (EGD) WITH PROPOFOL N/A 06/17/2020   Procedure: ESOPHAGOGASTRODUODENOSCOPY (EGD) WITH PROPOFOL;  Surgeon: Malissa Hippo, MD;  Location: [facility];  Service: Endoscopy;  Laterality: N/A;  . 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 HEMORROIDECTOMY    . LUMBAR SPINE SURGERY    . OOPHORECTOMY    . TONSILLECTOMY      Prior to Admission medications   Medication Sig Start Date End Date Taking? Authorizing Provider  amLODipine (NORVASC) 10 MG tablet Take 0.5 tablets (5 mg total) by mouth daily. 06/24/20  Yes Vassie Loll, MD  atorvastatin (LIPITOR) 20 MG tablet TAKE 1 TABLET BY MOUTH EVERY NIGHT AT BEDTIME Patient taking differently: Take 20 mg by mouth daily. 07/11/16  Yes Dorena Bodo, PA-C  doxycycline (VIBRA-TABS) 100 MG tablet Take 1 tablet (100 mg total) by mouth every 12 (twelve) hours for 5 days. 08/09/20 08/14/20 Yes Johnson, Clanford L, MD  famotidine (PEPCID) 10 MG tablet Take 1 tablet (10 mg total) by mouth daily. 08/09/20 09/08/20 Yes Johnson, Clanford L, MD  gabapentin (NEURONTIN) 600 MG tablet Take 1 tablet (600 mg total) by mouth 2 (two) times daily as needed (neuropathy pain). 08/09/20  Yes Johnson, Clanford L, MD  guaiFENesin (MUCINEX) 600 MG 12 hr tablet Take 2 tablets (1,200 mg total) by mouth 2 (two) times daily for 5 days. 08/09/20 08/14/20 Yes Johnson, Clanford L, MD  ipratropium-albuterol (DUONEB) 0.5-2.5 (3) MG/3ML SOLN Take 3 mLs by nebulization 3 (three) times daily. 08/09/20  Yes Johnson, Clanford L, MD  Iron Combinations (NIFEREX) TABS Take 1 tablet by mouth daily.  06/24/20  Yes Vassie Loll, MD  metoprolol tartrate (LOPRESSOR) 25 MG tablet Take 1 tablet (25 mg total) by mouth 2 (two) times daily. 06/24/20  Yes Vassie Loll, MD  Multiple Vitamins-Minerals (MULTIVITAMIN WITH MINERALS) tablet Take 1 tablet by mouth daily.   Yes [provider]  nystatin (MYCOSTATIN/NYSTOP) powder Apply topically 2 (two) times daily. Patient taking differently: Apply 1 application topically 2 (two) times daily as needed (apply to groin area for skin care). 07/06/20  Yes Shah, Pratik D, DO  oxyCODONE (ROXICODONE) 15 MG immediate release tablet Take 15 mg by mouth every 6 (six) hours as needed for pain.   Yes [provider]  potassium chloride SA (KLOR-CON) 10 MEQ tablet Take 1 tablet (10 mEq total) by mouth every other day. 08/12/20  Yes Johnson, Clanford L, MD  QUEtiapine (SEROQUEL) 200 MG tablet Take 200 mg by mouth at bedtime. 10/22/19  Yes [provider]  rOPINIRole (REQUIP) 3 MG tablet Take 3 mg by mouth at bedtime. 10/23/19  Yes [provider]  saccharomyces boulardii (FLORASTOR) 250 MG capsule Take 1 capsule (250 mg total) by mouth 2 (two) times daily for 10 days. 08/09/20 08/19/20 Yes Johnson, Clanford L, MD  sertraline (ZOLOFT) 25 MG tablet Take 25 mg by mouth daily. 05/04/20  Yes [provider]  sucralfate (CARAFATE) 1 g tablet Take 1 tablet (1 g total) by mouth 2 (two) times daily. 01/31/20 03/01/20 Yes Emokpae, Courage, MD  furosemide (LASIX) 20 MG tablet Take 1 tablet (20 mg total) by mouth every other day. 08/12/20   Johnson, Clanford L, MD  potassium chloride (KLOR-CON) 10 MEQ tablet Take 10 mEq by mouth daily. 08/09/20   [provider]    Scheduled Meds: . amLODipine  5 mg Oral Daily  . atorvastatin  20 mg Oral q1800  . doxycycline  100 mg Oral Q12H  . guaiFENesin  1,200 mg Oral BID  . metoprolol tartrate  25 mg Oral BID  . pantoprazole (PROTONIX) IV  40 mg Intravenous Q12H  . QUEtiapine  200 mg Oral QHS  . rOPINIRole  3 mg Oral QHS  . saccharomyces boulardii  250 mg Oral BID  . sertraline  25 mg Oral Daily   Continuous Infusions: . sodium chloride     PRN Meds:.sodium chloride, acetaminophen **OR** acetaminophen, fentaNYL (SUBLIMAZE) injection, gabapentin, ipratropium-albuterol,  ondansetron **OR** ondansetron (ZOFRAN) IV  Allergies as of 08/13/2020 - Review Complete 08/13/2020  Allergen Reaction Noted  . Prozac [fluoxetine hcl] Other (See Comments) 05/13/2014  . Zoloft [sertraline hcl]  05/13/2014  . Nicoderm [nicotine] Rash 01/28/2013    History reviewed. No pertinent family history.  Social History   Socioeconomic History  . Marital status: Divorced    Spouse name: Not on file  . Number of children: Not on file  . Years of education: Not on file  . Highest education level: Not on file  Occupational History  . Not on file  Tobacco Use  . Smoking status: Former Smoker    Packs/day: 0.50    Types: Cigarettes  . Smokeless tobacco: Former Neurosurgeon    Quit date: 12/28/2012  Vaping Use  . Vaping Use: Every day  Substance and Sexual Activity  . Alcohol use: No  . Drug use: No  . Sexual activity: Not on file  Other Topics Concern  . Not on file  Social History Narrative  . Not on file   Social Determinants of Health   Financial Resource Strain: Not on file  Food Insecurity: Not on file  Transportation Needs: Not on file  Physical Activity: Not on file  Stress: Not on file  Social Connections: Not on file  Intimate Partner Violence: Not on file    Review of Systems: Review of Systems  Constitutional: Negative for chills and fever.  HENT: Negative for sinus pain and sore throat.   Eyes: Negative for pain and redness.  Respiratory: Positive for cough. Negative for shortness of breath.   Cardiovascular: Negative for chest pain and palpitations.  Gastrointestinal: Positive for abdominal pain, nausea and vomiting. Negative for blood in stool, constipation, diarrhea, heartburn and melena.  Genitourinary: Negative for flank pain and hematuria.  Musculoskeletal: Negative for myalgias and neck pain.  Skin: Negative for itching and rash.  Neurological: Negative for seizures and loss of consciousness.  Endo/Heme/Allergies: Negative for polydipsia.  Bruises/bleeds easily.  Psychiatric/Behavioral: Negative for substance abuse. The patient is not nervous/anxious.      Physical Exam:  Physical Exam Vitals reviewed.  Constitutional:      Comments: Mild acute distress due to abdominal pain  HENT:     Head: Normocephalic and atraumatic.     Nose: Nose normal. No congestion.     Mouth/Throat:     Mouth: Mucous membranes are moist.     Pharynx: Oropharynx is clear.  Eyes:     Extraocular Movements: Extraocular movements intact.     Comments: Conjunctival pallor  Cardiovascular:     Rate and Rhythm: Normal rate and regular rhythm.  Pulmonary:     Effort: Tachypnea present.     Breath sounds: Normal breath sounds.  Abdominal:     General: Bowel sounds are normal. There is no distension.     Palpations: Abdomen is soft. There is no mass.     Tenderness: There is abdominal tenderness. There is no guarding or rebound.     Hernia: No hernia is present.  Musculoskeletal:        General: No swelling or tenderness.     Cervical back: Normal range of motion and neck supple.  Skin:    General: Skin is warm and dry.  Neurological:     General: No focal deficit present.     Mental Status: She is oriented to person, place, and time. She is lethargic.  Psychiatric:        Mood and Affect: Mood normal.        Behavior: Behavior normal. Behavior is cooperative.     Vital signs: Vitals:   08/13/20 0938  Resp: 18      GI:  Lab Results: Recent Labs    08/13/20 2305  HGB 8.6*  HCT 29.2*   BMET No results for input(s): NA, K, CL, CO2, GLUCOSE, BUN, CREATININE, CALCIUM in the last 72 hours. LFT No results for input(s): PROT, ALBUMIN, AST, ALT, ALKPHOS, BILITOT, BILIDIR, IBILI in the last 72 hours. PT/INR No results for input(s): LABPROT, INR in the last 72 hours.   Studies/Results: No results found.  Impression: Coffee ground emesis, anemia. EGD 06/2020 revealed 4 cm hiatal hernia, Roux-en-Y gastrojejunostomy with  gastrojejunal anastomosis characterized by ulceration and severe stenosis. Stricture dilated to 12 mm with balloon dilator. -Hgb 8.6, stable  Diastolic CHF: EF 43-15% as of echo 06/2020  Recent PNA  Plan: EGD today.  I thoroughly discussed the procedure with the patient at bedside as well as patient't daughter/POA Servando Snare via phone to include nature, alternatives, benefits, and risks (including but not limited to bleeding, infection, perforation, anesthesia/cardiac and  pulmonary complications). Patient and patient's daughter/POA verbalized understanding and gave verbal consent to proceed with EGD.  Continue Protonix 40 mg IV BID.  Continue to monitor H&H with transfusion as needed to maintain Hgb >7.  [facility] will follow.    LOS: 1 day   Edrick Kinslison Baron-Johnson  PA-C 08/14/2020, 8:46 AM  Contact #  [phone]

## 2020-08-14 NOTE — Progress Notes (Signed)
Notified Dr. Lowell Guitar that pt continues to refuse telemetry monitor.

## 2020-08-15 LAB — CBC WITH DIFFERENTIAL/PLATELET
Abs Immature Granulocytes: 0.01 10*3/uL (ref 0.00–0.07)
Basophils Absolute: 0 10*3/uL (ref 0.0–0.1)
Basophils Relative: 1 %
Eosinophils Absolute: 0.1 10*3/uL (ref 0.0–0.5)
Eosinophils Relative: 3 %
HCT: 30.9 % — ABNORMAL LOW (ref 36.0–46.0)
Hemoglobin: 8.4 g/dL — ABNORMAL LOW (ref 12.0–15.0)
Immature Granulocytes: 0 %
Lymphocytes Relative: 41 %
Lymphs Abs: 1.8 10*3/uL (ref 0.7–4.0)
MCH: 26.6 pg (ref 26.0–34.0)
MCHC: 27.2 g/dL — ABNORMAL LOW (ref 30.0–36.0)
MCV: 97.8 fL (ref 80.0–100.0)
Monocytes Absolute: 0.4 10*3/uL (ref 0.1–1.0)
Monocytes Relative: 10 %
Neutro Abs: 2 10*3/uL (ref 1.7–7.7)
Neutrophils Relative %: 45 %
Platelets: 376 10*3/uL (ref 150–400)
RBC: 3.16 MIL/uL — ABNORMAL LOW (ref 3.87–5.11)
RDW: 20.7 % — ABNORMAL HIGH (ref 11.5–15.5)
WBC: 4.3 10*3/uL (ref 4.0–10.5)
nRBC: 0 % (ref 0.0–0.2)

## 2020-08-15 LAB — COMPREHENSIVE METABOLIC PANEL
ALT: 11 U/L (ref 0–44)
AST: 12 U/L — ABNORMAL LOW (ref 15–41)
Albumin: 2.3 g/dL — ABNORMAL LOW (ref 3.5–5.0)
Alkaline Phosphatase: 69 U/L (ref 38–126)
Anion gap: 7 (ref 5–15)
BUN: 10 mg/dL (ref 8–23)
CO2: 28 mmol/L (ref 22–32)
Calcium: 8.4 mg/dL — ABNORMAL LOW (ref 8.9–10.3)
Chloride: 102 mmol/L (ref 98–111)
Creatinine, Ser: 0.5 mg/dL (ref 0.44–1.00)
GFR, Estimated: >60 mL/min (ref >60)
Glucose, Bld: 87 mg/dL (ref 70–99)
Potassium: 3.6 mmol/L (ref 3.5–5.1)
Sodium: 137 mmol/L (ref 135–145)
Total Bilirubin: 0.6 mg/dL (ref 0.3–1.2)
Total Protein: 5.4 g/dL — ABNORMAL LOW (ref 6.5–8.1)

## 2020-08-15 LAB — MAGNESIUM: Magnesium: 2 mg/dL (ref 1.7–2.4)

## 2020-08-15 LAB — PHOSPHORUS: Phosphorus: 4.1 mg/dL (ref 2.5–4.6)

## 2020-08-15 MED ORDER — OXYCODONE HCL 5 MG PO TABS
15.0000 mg | ORAL_TABLET | Freq: Four times a day (QID) | ORAL | Status: DC | PRN
  Administered 2020-08-15 – 2020-08-16 (×4): 15 mg via ORAL
  Filled 2020-08-15 (×4): qty 3

## 2020-08-15 MED ORDER — ALUM & MAG HYDROXIDE-SIMETH 200-200-20 MG/5ML PO SUSP
30.0000 mL | ORAL | Status: DC | PRN
  Administered 2020-08-15: 30 mL via ORAL
  Filled 2020-08-15: qty 30

## 2020-08-15 MED ORDER — PANTOPRAZOLE SODIUM 40 MG PO TBEC: 40.0000 mg | DELAYED_RELEASE_TABLET | Freq: Two times a day (BID) | ORAL | 0 refills | Status: AC

## 2020-08-15 NOTE — Discharge Summary (Addendum)
Physician Discharge Summary  Brenda Hopkins UVO:[id number] DOB: [birth year] DOA: 08/13/2020  PCP: Shawnie Dapper, PA-C  Admit date: 08/13/2020 Discharge date: 08/15/2020  Time spent: 40 minutes  Recommendations for Outpatient Follow-up:  1. Follow outpatient CBC/CMP 2. Follow biopsies outpatient  3. Follow with GI outpatient  4. Avoid NSAIDS  SNF will be able to take her 6/5.  Plan for discharge then.   Discharge Diagnoses:  Principal Problem:   Coffee ground emesis Active Problems:   Hypertension   Smoker   Generalized anxiety disorder   Gi Tract---Marginal ulcer   Chronic diastolic CHF (congestive heart failure) (HCC)   Anemia   Emphysema, unspecified (HCC)   Discharge Condition: stable  Diet recommendation: heart healthy  Filed Weights   08/13/20 0938 08/14/20 0500 08/15/20 0500  Weight: 63.7 kg 65 kg 60.2 kg    History of present illness:  Brenda Hopkins Brenda Hopkins 80 y.o.femalewith medical history significant forchronic diastolic CHF, emphysema, current smoker, anxiety, Roux-en-Y bypass in 2003, and recent admission with pneumonia and acute hypoxic respiratory failure, now presenting from her SNF for evaluation of upper abdominal pain, nausea, vomiting, and hematemesis. Patient was discharged [facility] on [facility]22after treatment for pneumonia, but reports that she has been having upperabdominal pain, nausea, vomiting, and reports seeing blood in her vomit. She has not been able to eat or drink much in the past couple days due to the symptoms. In terms of the recent pneumonia, she continues to have productive cough but has not noticed any fevers or chills.  [facility], patient is found to be saturating99% on 5 L/min of supplemental oxygen heart rate and blood pressure. We will for an albumin of 2.1 and CBC features Wynelle Dreier hemoglobin of 8.7. Lactic acid was normal. Lipase was normal. Patient was treated with  Reglan, Zofran, IV fluids, and IV Protonix in the ED. She was transferred to [facility] for further evaluation and management.  She's now s/p EGD which showed gastritis, large, deep clean based ulcer at the anastomosis.  Plan is for BID PPI x3 months, then daily indefinitely.  Will need outpatient follow up of biopsies outpatient.   See below for additional details  Hospital Course:  1.Hematemesis -Patient with hx of Roux-en-Y in 2003 reports several days of upper abdominal pain and N/V with coffee-ground emesis - CT in ED with mild fluid distension of esophagus - ADAT -s/p EGD 6/3 with gastritis, roux en y gastrojejunostomy with gastrojejunal anastomosis char by ulceration (biopsied). Per GI, recommending IV PPI BID while in hospital, then PO protonix BID for 3 months, then protonix 40 mg once Dorn Hartshorne day indefinitely.  FLD and ADAT.  Recommending follow up with primary GI after discharge.  - follow final biopsy results  -She had EGD on 06/17/20 with ulceration and severe stenosis at Roux-en-Y anastomosis  2.COPD; current smoker -Stable, continue as-needed duonebs -Not interested in quitting/cutting back on cigarettes at this time  3.Chronic diastolic CHF -Appears compensated -Continue beta-blocker as tolerated, hold Lasix for now given N/V  4.Pneumonia -Treated with IV antibiotics at [facility] recently and discharged 5/29 with 5 days of doxycycline  - She appears stable, CT from ED with radiographic improvement -has completed doxycycline   5.Anxiety -Continue Zoloft and Seroquel  6.Hypertension -Continue Norvasc and metoprolol as tolerated  Currently at [facility]  Procedures:  EGD  Consultations:  GI  Discharge Exam: Vitals:   08/15/20 0502 08/15/20 0759  BP: Marland Kitchen)  172/95 (!) 173/100  Pulse: 79 79  Resp: (!) 24 20  Temp: 98.4 F (36.9 C) (!) 97.5 F (36.4 C)  SpO2: 91% 94%   Feels ok, reports chronic  abdominal pain Called daughter and son, no answer  General: No acute distress. Cardiovascular: Heart sounds show Braxston Quinter regular rate, and rhythm. Lungs: Clear to auscultation bilaterally  Abdomen: Soft, nontender, nondistended Neurological: Alert and oriented 3. Moves all extremities 4. Cranial nerves II through XII grossly intact. Skin: Warm and dry. No rashes or lesions. Extremities: No clubbing or cyanosis. No edema.    Discharge Instructions   Discharge Instructions    Call MD for:  difficulty breathing, headache or visual disturbances   Complete by: As directed    Call MD for:  extreme fatigue   Complete by: As directed    Call MD for:  hives   Complete by: As directed    Call MD for:  persistant dizziness or light-headedness   Complete by: As directed    Call MD for:  persistant nausea and vomiting   Complete by: As directed    Call MD for:  redness, tenderness, or signs of infection (pain, swelling, redness, odor or green/yellow discharge around incision site)   Complete by: As directed    Call MD for:  severe uncontrolled pain   Complete by: As directed    Call MD for:  temperature >100.4   Complete by: As directed    Diet - low sodium heart healthy   Complete by: As directed    Diet - low sodium heart healthy   Complete by: As directed    Discharge instructions   Complete by: As directed    You were seen for coffee ground emesis (throwing up blood).    You had an endoscopy with GI which showed Josephus Harriger large, deep, clean based ulcer at the anastomosis and gastritis.  They took biopsies which are pending today on the day of discharge.  You should take protonix 40 mg twice daily for the next 3 months, then take 40 mg daily of protonix indefinitely.  Avoid all NSAIDS (ibuprofen, naproxen, advil, aleve, etc).  Please follow up with your gastroenterologist regarding your pending biopsy results as an outpatient.  You've completed your treatment for pneumonia.  Follow with your PCP  as an outpatient.  Return for new, recurrent, or worsening symptoms.  Please ask your PCP to request records from this hospitalization so they know what was done and what the next steps will be.   Increase activity slowly   Complete by: As directed    Increase activity slowly   Complete by: As directed      Allergies as of 08/15/2020      Reactions   Prozac [fluoxetine Hcl] Other (See Comments)   Makes her fell crazy   Zoloft [sertraline Hcl]    Made her feel crazy--10/2013   Nicoderm [nicotine] Rash   Site rash      Medication List    STOP taking these medications   doxycycline 100 MG tablet Commonly known as: VIBRA-TABS   famotidine 10 MG tablet Commonly known as: PEPCID   guaiFENesin 600 MG 12 hr tablet Commonly known as: MUCINEX   potassium chloride 10 MEQ tablet Commonly known as: KLOR-CON   sucralfate 1 g tablet Commonly known as: CARAFATE     TAKE these medications   amLODipine 10 MG tablet Commonly known as: NORVASC Take 0.5 tablets (5 mg total) by mouth daily.   atorvastatin  20 MG tablet Commonly known as: LIPITOR TAKE 1 TABLET BY MOUTH EVERY NIGHT AT BEDTIME What changed: when to take this   furosemide 20 MG tablet Commonly known as: LASIX Take 1 tablet (20 mg total) by mouth every other day.   gabapentin 600 MG tablet Commonly known as: NEURONTIN Take 1 tablet (600 mg total) by mouth 2 (two) times daily as needed (neuropathy pain).   ipratropium-albuterol 0.5-2.5 (3) MG/3ML Soln Commonly known as: DUONEB Take 3 mLs by nebulization 3 (three) times daily.   metoprolol tartrate 25 MG tablet Commonly known as: LOPRESSOR Take 1 tablet (25 mg total) by mouth 2 (two) times daily.   multivitamin with minerals tablet Take 1 tablet by mouth daily.   Niferex Tabs Take 1 tablet by mouth daily.   nystatin powder Commonly known as: MYCOSTATIN/NYSTOP Apply topically 2 (two) times daily. What changed:   how much to take  when to take  this  reasons to take this   oxyCODONE 15 MG immediate release tablet Commonly known as: ROXICODONE Take 15 mg by mouth every 6 (six) hours as needed for pain.   pantoprazole 40 MG tablet Commonly known as: Protonix Take 1 tablet (40 mg total) by mouth 2 (two) times daily. After 3 months, take daily indefinitely.   potassium chloride 10 MEQ tablet Commonly known as: KLOR-CON Take 1 tablet (10 mEq total) by mouth every other day.   QUEtiapine 200 MG tablet Commonly known as: SEROQUEL Take 200 mg by mouth at bedtime.   rOPINIRole 3 MG tablet Commonly known as: REQUIP Take 3 mg by mouth at bedtime.   saccharomyces boulardii 250 MG capsule Commonly known as: FLORASTOR Take 1 capsule (250 mg total) by mouth 2 (two) times daily for 10 days.   sertraline 25 MG tablet Commonly known as: ZOLOFT Take 25 mg by mouth daily.      Allergies  Allergen Reactions  . Prozac [Fluoxetine Hcl] Other (See Comments)    Makes her fell crazy  . Zoloft [Sertraline Hcl]     Made her feel crazy--10/2013  . Nicoderm [Nicotine] Rash    Site rash      The results of significant diagnostics from this hospitalization (including imaging, microbiology, ancillary and laboratory) are listed below for reference.    Significant Diagnostic Studies: DG Shoulder Right  Result Date: 07/27/2020 AP and scapular Y x-rays of the right shoulder were obtained in clinic today.  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  Closer look at the scapular Y x-ray demonstrates that the humeral head is abutting the glenoid, and it appears as though there is an anterior and superior glenoid deficiency.  Pression: Severe glenohumeral arthritis, with glenoid deficiency.  CT ANGIO CHEST PE W OR WO CONTRAST  Result Date: 08/07/2020 CLINICAL DATA:  Respiratory distress EXAM: CT ANGIOGRAPHY CHEST WITH CONTRAST TECHNIQUE: Multidetector CT imaging of the  chest was performed using the standard protocol during bolus administration of intravenous contrast. Multiplanar CT image reconstructions and MIPs were obtained to evaluate the vascular anatomy. CONTRAST:  OMNIPAQUE IOHEXOL 350 MG/ML SOLN COMPARISON:  Chest CT with contrast 06/20/2020 FINDINGS: Cardiovascular: Chronic cardiomegaly. No pericardial effusion. Extensive atheromatous calcification of the aorta and coronaries. Satisfactory opacification of the pulmonary arteries but limited by motion. No evidence of pulmonary embolism. Mediastinum/Nodes: Small sliding hiatal hernia with borderline circumferential thickening of the lower esophagus. No adenopathy, mass, or pneumomediastinum. Lungs/Pleura: Limited by motion artifact. There is streaky  and ground-glass airspace type opacity in the bilateral lungs, greatest in the left lower lobe. Some of this opacity appears acute based on prior CT. Upper Abdomen: Partially covered lobulated density in the ventral epigastrium is transverse colon. Musculoskeletal: Remote bilateral rib fractures. 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 Remote but unhealed T9 compression fracture with vertebra plana and gas containing central fracture cleft. Lucency at the sternum with fracture cleft which has been present since at least 2021. The central lucency is low-density and may reflect pseudoarthrosis with ganglion. Review of the MIP images confirms the above findings. IMPRESSION: 1. Patchy pneumonia with background atelectasis/scarring. 2. No evidence of pulmonary embolism. Sensitivity is diminished by motion artifact. 3. Chronic but unhealed T9 and sternal fractures. Remote rib fractures. 4. Severe atherosclerosis. Electronically Signed   By: Marnee Spring M.D.   On: 08/07/2020 07:07   DG Chest Port 1 View  Result Date: 08/08/2020 CLINICAL DATA:  Pneumonia.  Short of breath. EXAM: PORTABLE CHEST 1 VIEW COMPARISON:  08/07/2020 and older studies. FINDINGS: Linear  opacities noted at the lung bases, stable consistent with atelectasis. Remainder of the lungs is clear. Cardiac silhouette is stable.  No mediastinal or hilar masses. No evidence of Isaid Salvia pleural effusion.  No pneumothorax. IMPRESSION: 1. No acute findings and no change from the previous day's exam. Electronically Signed   By: Amie Portland M.D.   On: 08/08/2020 10:19   DG Chest Port 1 View  Result Date: 08/07/2020 CLINICAL DATA:  Shortness of breath EXAM: PORTABLE CHEST 1 VIEW COMPARISON:  07/04/2020 FINDINGS: Low volume chest with chronic streaky densities bilaterally. Remote right rib fractures with callus. Anterior subluxation of the right glenohumeral joint which is severely degenerated, similar to prior studies. Cardiomegaly. No edema, effusion, or pneumothorax. Bilateral skin folds. Artifact from EKG leads. IMPRESSION: Low volume chest with atelectasis/scarring that is similar to prior. No acute finding. Electronically Signed   By: Marnee Spring M.D.   On: 08/07/2020 04:57    Microbiology: Recent Results (from the past 240 hour(s))  Resp Panel by RT-PCR (Flu Kathee Tumlin&B, Covid) Nasopharyngeal Swab     Status: None   Collection Time: 08/07/20  5:41 AM   Specimen: Nasopharyngeal Swab; Nasopharyngeal(NP) swabs in vial transport medium  Result Value Ref Range Status   SARS Coronavirus 2 by RT PCR NEGATIVE NEGATIVE Final    Comment: (NOTE) SARS-CoV-2 target nucleic acids are NOT DETECTED.  The SARS-CoV-2 RNA is generally detectable in upper respiratory specimens during the acute phase of infection. The lowest concentration of SARS-CoV-2 viral copies this assay can detect is 138 copies/mL. Georgie Haque negative result does not preclude SARS-Cov-2 infection and should not be used as the sole basis for treatment or other patient management decisions. Jaryn Rosko negative result may occur with  improper specimen collection/handling, submission of specimen other than nasopharyngeal swab, presence of viral mutation(s) within  the areas targeted by this assay, and inadequate number of viral copies(<138 copies/mL). Carden Teel negative result must be combined with clinical observations, patient history, and epidemiological information. The expected result is Negative.  Fact Sheet for Patients:  BloggerCourse.com  Fact Sheet for Healthcare Providers:  SeriousBroker.it  This test is no t yet approved or cleared by the [place] FDA and  has been authorized for detection and/or diagnosis of SARS-CoV-2 by FDA under an Emergency Use Authorization (EUA). This EUA will remain  in effect (meaning this test can be used) for the duration of the COVID-19 declaration under Section 564(b)(1) of the Act, 21 U.S.C.section 360bbb-3(b)(1),  unless the authorization is terminated  or revoked sooner.       Influenza Chamille Werntz by PCR NEGATIVE NEGATIVE Final   Influenza B by PCR NEGATIVE NEGATIVE Final    Comment: (NOTE) The Xpert Xpress SARS-CoV-2/FLU/RSV plus assay is intended as an aid in the diagnosis of influenza from Nasopharyngeal swab specimens and should not be used as Yailene Badia sole basis for treatment. Nasal washings and aspirates are unacceptable for Xpert Xpress SARS-CoV-2/FLU/RSV testing.  Fact Sheet for Patients: BloggerCourse.com  Fact Sheet for Healthcare Providers: SeriousBroker.it  This test is not yet approved or cleared by the [place] FDA and has been authorized for detection and/or diagnosis of SARS-CoV-2 by FDA under an Emergency Use Authorization (EUA). This EUA will remain in effect (meaning this test can be used) for the duration of the COVID-19 declaration under Section 564(b)(1) of the Act, 21 U.S.C. section 360bbb-3(b)(1), unless the authorization is terminated or revoked.  Performed at [facility], [address], [place], [place] [address]   MRSA PCR Screening     Status: Abnormal   Collection Time:  08/07/20 10:57 AM   Specimen: Nasopharyngeal  Result Value Ref Range Status   MRSA by PCR POSITIVE (Meaghen Vecchiarelli) NEGATIVE Final    Comment:        The GeneXpert MRSA Assay (FDA approved for NASAL specimens only), is one component of Mohamadou Maciver comprehensive MRSA colonization surveillance program. It is not intended to diagnose MRSA infection nor to guide or monitor treatment for MRSA infections. RESULT CALLED TO, READ BACK BY AND VERIFIED WITH: CALDER @ 0845 ON [id number] BY HENDERSON L. Performed at [facility], [address] [place], [place] [address]   SARS CORONAVIRUS 2 (TAT 6-24 HRS) Nasopharyngeal Nasopharyngeal Swab     Status: None   Collection Time: 08/13/20 10:44 PM   Specimen: Nasopharyngeal Swab  Result Value Ref Range Status   SARS Coronavirus 2 NEGATIVE NEGATIVE Final    Comment: (NOTE) SARS-CoV-2 target nucleic acids are NOT DETECTED.  The SARS-CoV-2 RNA is generally detectable in upper and lower respiratory specimens during the acute phase of infection. Negative results do not preclude SARS-CoV-2 infection, do not rule out co-infections with other pathogens, and should not be used as the sole basis for treatment or other patient management decisions. Negative results must be combined with clinical observations, patient history, and epidemiological information. The expected result is Negative.  Fact Sheet for Patients: HairSlick.no  Fact Sheet for Healthcare Providers: quierodirigir.com  This test is not yet approved or cleared by the [place] FDA and  has been authorized for detection and/or diagnosis of SARS-CoV-2 by FDA under an Emergency Use Authorization (EUA). This EUA will remain  in effect (meaning this test can be used) for the duration of the COVID-19 declaration under Se ction 564(b)(1) of the Act, 21 U.S.C. section 360bbb-3(b)(1), unless the authorization is terminated or revoked sooner.  Performed at  [facility], [address], [place], [place] [address]      Labs: Basic Metabolic Panel: Recent Labs  Lab 08/09/20 0415 08/15/20 0613  NA 134* 137  K 4.7 3.6  CL 105 102  CO2 26 28  GLUCOSE 90 87  BUN 17 10  CREATININE 0.59 0.50  CALCIUM 7.8* 8.4*  MG 1.8 2.0  PHOS  --  4.1   Liver Function Tests: Recent Labs  Lab 08/09/20 0415 08/15/20 0613  AST 11* 12*  ALT 11 11  ALKPHOS 82 69  BILITOT 0.3 0.6  PROT 5.3* 5.4*  ALBUMIN 1.8* 2.3*   No results for input(s): LIPASE, AMYLASE in the last 168 hours. No results for input(s): AMMONIA in the last 168 hours. CBC: Recent Labs  Lab 08/09/20 0415 08/13/20 2305 08/14/20 1349 08/14/20 2150 08/15/20 0613  WBC 5.5  --   --   --  4.3  NEUTROABS 3.2  --   --   --  2.0  HGB 7.5* 8.6* 9.7* 8.2* 8.4*  HCT 25.6* 29.2* 33.5* 28.0* 30.9*  MCV 88.6  --   --   --  97.8  PLT 440*  --   --   --  376   Cardiac Enzymes: No results for input(s): CKTOTAL, CKMB, CKMBINDEX, TROPONINI in the last 168 hours. BNP: BNP (last 3 results) Recent Labs    06/16/20 0454 06/17/20 0648 08/07/20 0442  BNP 198.0* 167.0* 188.0*    ProBNP (last 3 results) No results for input(s): PROBNP in the last 8760 hours.  CBG: No results for input(s): GLUCAP in the last 168 hours.     Signed:  Lacretia Nicksaldwell Powell MD.  [facility] 08/15/2020, 12:58 PM

## 2020-08-15 NOTE — Evaluation (Signed)
Physical Therapy Evaluation Patient Details Name: Brenda Hopkins MRN: [id number] DOB: [birth year] Today's Date: 08/15/2020   History of Present Illness  Brenda Hopkins is a 80 y.o. female with medical history significant for chronic diastolic CHF, emphysema, current smoker, anxiety, Roux-en-Y bypass in 2003, and recent admission with pneumonia and acute hypoxic respiratory failure, now presenting from her SNF for evaluation of upper abdominal pain, nausea, vomiting, and hematemesis.  Clinical Impression  Pt admitted with above diagnosis.  Pt currently with functional limitations due to the deficits listed below (see PT Problem List). Pt will benefit from skilled PT to increase their independence and safety with mobility to allow discharge to the venue listed below.  Pt lives alone and has been at a SNF after another recent hospitalization.  She is happy with the facility and would like to return to continue her rehab there with goal of eventually returning home alone.  Agree that SNF is appropriate and best d/c plan for this pt.     Follow Up Recommendations SNF    Equipment Recommendations  None recommended by PT    Recommendations for Other Services       Precautions / Restrictions Precautions Precautions: Fall Restrictions Weight Bearing Restrictions: No      Mobility  Bed Mobility Overal bed mobility: Needs Assistance Bed Mobility: Supine to Sit     Supine to sit: Min guard     General bed mobility comments: HOB elevated and use of rail    Transfers Overall transfer level: Needs assistance Equipment used: Rolling walker (2 wheeled) Transfers: Sit to/from Stand Sit to Stand: Min guard;Min assist         General transfer comment: poor use of UE and needing cues for safe technique  Ambulation/Gait Ambulation/Gait assistance: Min guard Gait Distance (Feet): 10 Feet Assistive device: Rolling walker (2 wheeled) Gait Pattern/deviations: Decreased step length  - right;Decreased step length - left;Trunk flexed     General Gait Details: Pt declined walking farther than 10'. Short step length with flexed posture.  Stairs            Wheelchair Mobility    Modified Rankin (Stroke Patients Only)       Balance Overall balance assessment: Needs assistance   Sitting balance-Leahy Scale: Good       Standing balance-Leahy Scale: Poor Standing balance comment: requires UE support                             Pertinent Vitals/Pain Pain Assessment: Faces Faces Pain Scale: Hurts even more Pain Location: stomach and headache Pain Descriptors / Indicators: Aching Pain Intervention(s): Monitored during session    Home Living Family/patient expects to be discharged to:: Skilled nursing facility                 Additional Comments: Has been at a SNF getting rehab and working on getting stronger. She is happy with the facility.    Prior Function                 Hand Dominance        Extremity/Trunk Assessment   Upper Extremity Assessment Upper Extremity Assessment: Overall WFL for tasks assessed;Generalized weakness    Lower Extremity Assessment Lower Extremity Assessment: Overall WFL for tasks assessed;Generalized weakness       Communication      Cognition Arousal/Alertness: Awake/alert Behavior During Therapy: WFL for tasks assessed/performed Overall Cognitive Status: Within Functional Limits for  tasks assessed                                        General Comments      Exercises     Assessment/Plan    PT Assessment Patient needs continued PT services  PT Problem List Decreased strength;Decreased activity tolerance;Decreased balance;Decreased mobility       PT Treatment Interventions DME instruction;Gait training;Functional mobility training;Neuromuscular re-education;Balance training;Therapeutic exercise;Therapeutic activities    PT Goals (Current goals can be found  in the Care Plan section)  Acute Rehab PT Goals Patient Stated Goal: Return to SNF for continued rehab PT Goal Formulation: With patient Time For Goal Achievement: 08/29/20 Potential to Achieve Goals: Good    Frequency Min 2X/week   Barriers to discharge        Co-evaluation               AM-PAC PT "6 Clicks" Mobility  Outcome Measure Help needed turning from your back to your side while in a flat bed without using bedrails?: A Little Help needed moving from lying on your back to sitting on the side of a flat bed without using bedrails?: A Little Help needed moving to and from a bed to a chair (including a wheelchair)?: A Little Help needed standing up from a chair using your arms (e.g., wheelchair or bedside chair)?: A Little Help needed to walk in hospital room?: A Little Help needed climbing 3-5 steps with a railing? : A Lot 6 Click Score: 17    End of Session Equipment Utilized During Treatment: Gait belt Activity Tolerance: Patient tolerated treatment well Patient left: in chair;with call bell/phone within reach;with chair alarm set Nurse Communication: Mobility status PT Visit Diagnosis: Difficulty in walking, not elsewhere classified (R26.2);Muscle weakness (generalized) (M62.81)    Time: 1042-1100 PT Time Calculation (min) (ACUTE ONLY): 18 min   Charges:   PT Evaluation $PT Eval Low Complexity: 1 Low          Vianka Ertel L. Katrinka Blazing, [place] Pager [phone] 08/15/2020   Enzo Montgomery 08/15/2020, 11:07 AM

## 2020-08-15 NOTE — Plan of Care (Signed)
  Problem: Nutrition: Goal: Adequate nutrition will be maintained Outcome: Progressing   Problem: Elimination: Goal: Will not experience complications related to bowel motility Outcome: Progressing   Problem: Pain Managment: Goal: General experience of comfort will improve Outcome: Progressing   

## 2020-08-15 NOTE — TOC Initial Note (Addendum)
Transition of Care [facility]) - Initial/Assessment Note    Patient Details  Name: Brenda Hopkins MRN: [id number] Date of Birth: 18-Aug-1940  Transition of Care [facility]) CM/SW Contact:    Lennart Pall, LCSW Phone Number: 08/15/2020, 11:37 AM  Clinical Narrative:                 Met with pt and spoke with daughter via phone to introduce self/ TOC role.  Both confirm that pt is currently a resident at [facility] in [place] and the plan is for her to return when medically cleared.  They understand that TOC will assist with return when ready.  No concerns.  Addendum: Alerted by MD that pt is medically ready for dc.  Spoke with Debbie at [facility] who states they can bring pt back tomorrow (no bed today).  I have begun insurance authorization for pt's return (ref# [id number]).  Will ask TOC to follow up and assist with transfer back to SNF tomorrow. Left VM for daughter and have informed pt.  Expected Discharge Plan: Skilled Nursing Facility Barriers to Discharge: Continued Medical Work up   Patient Goals and CMS Choice Patient states their goals for this hospitalization and ongoing recovery are:: return to SNF      Expected Discharge Plan and Services Expected Discharge Plan: [place]       Living arrangements for the past 2 months: [place]                 DME Arranged: N/A DME Agency: NA                  Prior Living Arrangements/Services Living arrangements for the past 2 months: [place] Lives with:: Facility Resident Patient language and need for interpreter reviewed:: Yes Do you feel safe going back to the place where you live?: Yes      Need for Family Participation in Patient Care: No (Comment) Care giver support system in place?: Yes (comment)   Criminal Activity/Legal Involvement Pertinent to Current Situation/Hospitalization: No - Comment as needed  Activities of Daily Living Home Assistive  Devices/Equipment: Wheelchair ADL Screening (condition at time of admission) Patient's cognitive ability adequate to safely complete daily activities?: Yes Is the patient deaf or have difficulty hearing?: No Does the patient have difficulty seeing, even when wearing glasses/contacts?: No Does the patient have difficulty concentrating, remembering, or making decisions?: No Patient able to express need for assistance with ADLs?: Yes Does the patient have difficulty dressing or bathing?: Yes Independently performs ADLs?: No Communication: Independent Dressing (OT): Needs assistance Is this a change from baseline?: Pre-admission baseline Grooming: Needs assistance Is this a change from baseline?: Pre-admission baseline Feeding: Needs assistance Is this a change from baseline?: Pre-admission baseline Bathing: Needs assistance Is this a change from baseline?: Pre-admission baseline Toileting: Needs assistance Is this a change from baseline?: Pre-admission baseline In/Out Bed: Needs assistance Is this a change from baseline?: Pre-admission baseline Walks in Home: Needs assistance Is this a change from baseline?: Pre-admission baseline Does the patient have difficulty walking or climbing stairs?: Yes Weakness of Legs: Both Weakness of Arms/Hands: None  Permission Sought/Granted Permission sought to share information with : Family Supports Permission granted to share information with : Yes, Verbal Permission Granted  Share Information with NAME: Ladell Pier     Permission granted to share info w Relationship: daughter  Permission granted to share info w Contact Information: [phone]  Emotional Assessment Appearance:: Appears stated age  Attitude/Demeanor/Rapport: Engaged,Gracious Affect (typically observed): Accepting Orientation: : Oriented to Self,Oriented to Place,Oriented to  Time,Oriented to Situation Alcohol / Substance Use: Not Applicable Psych Involvement: No  (comment)  Admission diagnosis:  Coffee ground emesis [K92.0] Patient Active Problem List   Diagnosis Date Noted  . Coffee ground emesis 08/13/2020  . Emphysema, unspecified ([place]) 08/13/2020  . HCAP (healthcare-associated pneumonia) 08/07/2020  . DNR (do not resuscitate) 08/07/2020  . CHF (congestive heart failure) ([place])   . Pneumoperitoneum   . Hypokalemia 06/29/2020  . Falls 06/29/2020  . Physical deconditioning   . SIRS (systemic inflammatory response syndrome) ([place]) 06/19/2020  . Proctitis   . Acute metabolic encephalopathy 56/43/3295  . Hematochezia 06/18/2020  . Abdominal pain   . Anemia   . Chronic diastolic CHF (congestive heart failure) ([place]) 06/12/2020  . Gi Tract---Marginal ulcer 01/30/2020  . Noncompliance with medication regimen 01/30/2020  . AKI (acute kidney injury) ([place]) 01/30/2020  . Dehydration 01/30/2020  . Hypoalbuminemia 01/30/2020  . Obesity (BMI 30.0-34.9) 01/30/2020  . Nausea 01/30/2020  . Acute respiratory failure with hypoxia ([place]) 01/30/2020  . Epigastric pain 01/16/2020  . Ventral hernia without obstruction or gangrene   . Intractable epigastric abdominal pain 01/15/2020  . Insomnia 05/13/2014  . Generalized anxiety disorder 07/15/2013  . Hypertension   . Chronic back pain   . Hyperlipidemia   . GERD (gastroesophageal reflux disease)   . Hiatal hernia   . Smoker   . Pelvic fracture (HCC) 12/12/2012   PCP:  Cory Munch, PA-C Pharmacy:   [facility], [address] AT [place] [place] [address] Phone: [phone] Fax: [phone]     Social Determinants of Health (SDOH) Interventions    Readmission Risk Interventions Readmission Risk Prevention Plan 06/30/2020  Transportation Screening Complete  HRI or Home Care Consult Complete  SW Recovery Care/Counseling Consult Complete  Palliative Care Screening Not Applicable  Skilled Nursing Facility  Complete  Some recent data might be hidden

## 2020-08-16 LAB — COMPREHENSIVE METABOLIC PANEL
ALT: 10 U/L (ref 0–44)
AST: 12 U/L — ABNORMAL LOW (ref 15–41)
Albumin: 2.5 g/dL — ABNORMAL LOW (ref 3.5–5.0)
Alkaline Phosphatase: 80 U/L (ref 38–126)
Anion gap: 5 (ref 5–15)
BUN: 17 mg/dL (ref 8–23)
CO2: 32 mmol/L (ref 22–32)
Calcium: 8.5 mg/dL — ABNORMAL LOW (ref 8.9–10.3)
Chloride: 100 mmol/L (ref 98–111)
Creatinine, Ser: 0.85 mg/dL (ref 0.44–1.00)
GFR, Estimated: >60 mL/min (ref >60)
Glucose, Bld: 99 mg/dL (ref 70–99)
Potassium: 4.7 mmol/L (ref 3.5–5.1)
Sodium: 137 mmol/L (ref 135–145)
Total Bilirubin: 0.5 mg/dL (ref 0.3–1.2)
Total Protein: 6 g/dL — ABNORMAL LOW (ref 6.5–8.1)

## 2020-08-16 LAB — PHOSPHORUS: Phosphorus: 4.4 mg/dL (ref 2.5–4.6)

## 2020-08-16 LAB — CBC
HCT: 31.3 % — ABNORMAL LOW (ref 36.0–46.0)
Hemoglobin: 9.2 g/dL — ABNORMAL LOW (ref 12.0–15.0)
MCH: 26.4 pg (ref 26.0–34.0)
MCHC: 29.4 g/dL — ABNORMAL LOW (ref 30.0–36.0)
MCV: 89.7 fL (ref 80.0–100.0)
Platelets: 411 10*3/uL — ABNORMAL HIGH (ref 150–400)
RBC: 3.49 MIL/uL — ABNORMAL LOW (ref 3.87–5.11)
RDW: 20.7 % — ABNORMAL HIGH (ref 11.5–15.5)
WBC: 4.6 10*3/uL (ref 4.0–10.5)
nRBC: 0 % (ref 0.0–0.2)

## 2020-08-16 LAB — DIFFERENTIAL
Abs Immature Granulocytes: 0.02 10*3/uL (ref 0.00–0.07)
Basophils Absolute: 0 10*3/uL (ref 0.0–0.1)
Basophils Relative: 1 %
Eosinophils Absolute: 0.2 10*3/uL (ref 0.0–0.5)
Eosinophils Relative: 4 %
Immature Granulocytes: 0 %
Lymphocytes Relative: 32 %
Lymphs Abs: 1.5 10*3/uL (ref 0.7–4.0)
Monocytes Absolute: 0.5 10*3/uL (ref 0.1–1.0)
Monocytes Relative: 12 %
Neutro Abs: 2.4 10*3/uL (ref 1.7–7.7)
Neutrophils Relative %: 51 %

## 2020-08-16 LAB — MAGNESIUM: Magnesium: 2.2 mg/dL (ref 1.7–2.4)

## 2020-08-16 MED ORDER — OXYCODONE HCL 5 MG PO TABS
15.0000 mg | ORAL_TABLET | ORAL | Status: DC | PRN
  Administered 2020-08-16 – 2020-08-18 (×10): 15 mg via ORAL
  Filled 2020-08-16 (×10): qty 3

## 2020-08-16 MED ORDER — OXYCODONE HCL 15 MG PO TABS: 15.0000 mg | ORAL_TABLET | Freq: Four times a day (QID) | ORAL | 0 refills | Status: AC | PRN

## 2020-08-16 NOTE — TOC Progression Note (Signed)
Transition of Care [facility]) - Progression Note    Patient Details  Name: Brenda Hopkins MRN: [id number] Date of Birth: 01-01-1941  Transition of Care [facility]) CM/SW Contact  Maaz Spiering, Meriam Sprague, RN Phone Number: 08/16/2020, 2:45 PM  Clinical Narrative:    [facility] contacted. Continuing to wait on SNF auth.   Expected Discharge Plan: Skilled Nursing Facility Barriers to Discharge: Continued Medical Work up  Expected Discharge Plan and Services Expected Discharge Plan: Skilled Nursing Facility       Living arrangements for the past 2 months: Skilled Nursing Facility Expected Discharge Date: 08/16/20               DME Arranged: N/A DME Agency: NA                   Social Determinants of Health (SDOH) Interventions    Readmission Risk Interventions Readmission Risk Prevention Plan 06/30/2020  Transportation Screening Complete  HRI or Home Care Consult Complete  SW Recovery Care/Counseling Consult Complete  Palliative Care Screening Not Applicable  Skilled Nursing Facility Complete  Some recent data might be hidden

## 2020-08-16 NOTE — Progress Notes (Signed)
Seen at bedside Notes chronic abdominal discomfort persistent.  Discussed d/c plans, she's comfortable with discharge.  Called daughter, no answer today (discussed with son yesterday - he called back after initial missed call)    Vitals:   08/15/20 2057 08/16/20 0454  BP: (!) 173/97 (!) 157/88  Pulse: 79 71  Resp: (!) 24 16  Temp: 97.7 F (36.5 C) 97.8 F (36.6 C)  SpO2: 96% 93%   NAD RRR, unlabored breathing Mild abdominal discomfort with palpation No LEE No focal neurologic deficits appreciated.  Plan for discharge today back to SNF.   Continue BID PPI.  Plan for outpatient GI follow up. See d/c summary from 6/4.

## 2020-08-17 ENCOUNTER — Encounter (HOSPITAL_COMMUNITY): Payer: Self-pay | Admitting: Gastroenterology

## 2020-08-17 ENCOUNTER — Inpatient Hospital Stay (HOSPITAL_COMMUNITY): Payer: Medicare Other

## 2020-08-17 LAB — TROPONIN I (HIGH SENSITIVITY)
Troponin I (High Sensitivity): 6 ng/L (ref <18)
Troponin I (High Sensitivity): 6 ng/L (ref <18)

## 2020-08-17 LAB — LIPASE, BLOOD
Lipase: 26 U/L (ref 11–51)
Lipase: 24 U/L (ref 11–51)

## 2020-08-17 MED ORDER — IOHEXOL 300 MG/ML  SOLN
100.0000 mL | Freq: Once | INTRAMUSCULAR | Status: AC | PRN
  Administered 2020-08-17: 100 mL via INTRAVENOUS

## 2020-08-17 MED ORDER — IOHEXOL 9 MG/ML PO SOLN
ORAL | Status: AC
  Administered 2020-08-17: 500 mL
  Filled 2020-08-17: qty 1000

## 2020-08-17 MED ORDER — POLYETHYLENE GLYCOL 3350 17 G PO PACK
17.0000 g | PACK | Freq: Two times a day (BID) | ORAL | Status: DC
  Filled 2020-08-17 (×2): qty 1

## 2020-08-17 MED ORDER — SODIUM CHLORIDE (PF) 0.9 % IJ SOLN
INTRAMUSCULAR | Status: AC
  Filled 2020-08-17: qty 50

## 2020-08-17 MED ORDER — MORPHINE SULFATE (PF) 4 MG/ML IV SOLN
4.0000 mg | Freq: Once | INTRAVENOUS | Status: AC
  Administered 2020-08-17: 4 mg via INTRAVENOUS
  Filled 2020-08-17: qty 1

## 2020-08-17 NOTE — TOC Progression Note (Signed)
Transition of Care [facility]) - Progression Note    Patient Details  Name: Brenda Hopkins MRN: [id number] Date of Birth: [birth year]  Transition of Care [facility]) CM/SW Contact  Ida Rogue, [place] Phone Number: 08/17/2020, 10:02 AM  Clinical Narrative:   Spoke with Debbie at [facility].  She will contact her Navi person to see where the authorization is in the process.  No COVID test needed.  She is ready to accept patient as soon as Berkley Harvey is confirmed. TOC will continue to follow during the course of hospitalization.     Expected Discharge Plan: Skilled Nursing Facility Barriers to Discharge: Continued Medical Work up  Expected Discharge Plan and Services Expected Discharge Plan: Skilled Nursing Facility       Living arrangements for the past 2 months: Skilled Nursing Facility Expected Discharge Date: 08/16/20               DME Arranged: N/A DME Agency: NA                   Social Determinants of Health (SDOH) Interventions    Readmission Risk Interventions Readmission Risk Prevention Plan 06/30/2020  Transportation Screening Complete  HRI or Home Care Consult Complete  SW Recovery Care/Counseling Consult Complete  Palliative Care Screening Not Applicable  Skilled Nursing Facility Complete  Some recent data might be hidden

## 2020-08-17 NOTE — Progress Notes (Signed)
Notified Dr. Lowell Guitar of pts EKG results. Order to place pt on tele.

## 2020-08-17 NOTE — Progress Notes (Addendum)
Appropriate for discharge, awaiting insurance auth No new complaints today, says she's doing ok Asking when discharge will be  Vitals:   08/17/20 0443 08/17/20 1342  BP: 131/75 130/69  Pulse: 72 66  Resp: (!) 24 16  Temp: 98.1 F (36.7 C) 98.3 F (36.8 C)  SpO2: 92% 94%   NAD RRR Mild chronic abdominal discomfort, soft, nondistended No LEE No focal neurologic deficits Mood and affect appropriate  Awaiting insurance auth back to SNF Pending pathology from EGD Continue PPI BID See d/c summary from 6/4 for additional details    Addendum Per RN, woke up c/o abdominal pain Seen at bedside, c/o epigastric discomfort and back pain - she woke up with this she notes  Appears uncomfortable RRR Unlabored breathing Distended abdomen, mildly uncomfortable with palpation No significant LE edema She notes back pain, but no notable TTP with palpation of spine or paraspinal muscles  Brenda Hopkins/P Epigastric pain, unclear etiology at this point - EGD notable for gastritis/ulceration.  She has chronic pain on high dose opiates, could be component of pain seeking behavior as well, back pain she complained of was poorly described/localized and exam not particularly impressive.  Will w/u with imaging, labs, tests as noted below. Follow EKG (sinus, without concerning findings) Follow CT abdomen/pelvis due to abdominal discomfort Lipase, troponin.  CXR.  Will give dose of morphine x1. W/u additionally as needed.

## 2020-08-17 NOTE — Consult Note (Signed)
   [facility] CM Inpatient Consult   08/17/2020  Brenda Hopkins Jul 07, 1940 [id number]   Patient screened for high risk score for unplanned readmission. Chart reviewed to assess for potential [facility] Care Management community service needs. Per review, patient is being recommended for a skilled nursing facility level of care. No [facility] Care Management follow up needs at this time.   Christophe Louis, MSN, RN [facility] Liaison Nurse Mobile Phone [phone]  Toll free office [phone]

## 2020-08-18 ENCOUNTER — Inpatient Hospital Stay (HOSPITAL_COMMUNITY): Payer: Medicare Other

## 2020-08-18 LAB — SURGICAL PATHOLOGY

## 2020-08-18 NOTE — TOC Transition Note (Signed)
Transition of Care [facility]) - CM/SW Discharge Note   Patient Details  Name: Brenda Hopkins MRN: [id number] Date of Birth: [birth year]  Transition of Care [facility]) CM/SW Contact:  Ida Rogue, LCSW Phone Number: 08/18/2020, 12:07 PM   Clinical Narrative:  Patient who is stable for d/c today will return to [facility].  Debbie at [facility] is aware that she was denied authorization for rehab by insurance.  PTAR arranged.  Nursing, please call report to  [phone]. TOC sign off.    Final next level of care: Skilled Nursing Facility Barriers to Discharge: Barriers Resolved   Patient Goals and CMS Choice Patient states their goals for this hospitalization and ongoing recovery are:: return to SNF      Discharge Placement                       Discharge Plan and Services                DME Arranged: N/A DME Agency: NA                  Social Determinants of Health (SDOH) Interventions     Readmission Risk Interventions Readmission Risk Prevention Plan 06/30/2020  Transportation Screening Complete  HRI or Home Care Consult Complete  SW Recovery Care/Counseling Consult Complete  Palliative Care Screening Not Applicable  Skilled Nursing Facility Complete  Some recent data might be hidden

## 2020-08-18 NOTE — Progress Notes (Addendum)
Report called to receiving facility [facility]) [facility] RN received the report. Pt. Awaiting for transport. Attempted to notify the daughter of the transfer but no answer. RN called twice. Pt. 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

## 2020-08-18 NOTE — Discharge Summary (Signed)
Physician Discharge Summary  Brenda Hopkins WUJ:[id number] DOB: 1940-09-02 DOA: 08/13/2020  PCP: Shawnie Dapper, PA-C  Admit date: 08/13/2020 Discharge date: 08/18/2020  Time spent: 40 minutes  Recommendations for Outpatient Follow-up:  1. Follow outpatient CBC/CMP 2. Follow biopsies outpatient (mild chronic gastritis with reactive changes) 3. Follow with GI outpatient  4. Avoid NSAIDS 5. Follow imaging findings outpatient   Discharge Diagnoses:  Principal Problem:   Coffee ground emesis Active Problems:   Hypertension   Smoker   Generalized anxiety disorder   Gi Tract---Marginal ulcer   Chronic diastolic CHF (congestive heart failure) (HCC)   Anemia   Emphysema, unspecified (HCC)   Discharge Condition: stable  Diet recommendation: heart healthy  Filed Weights   08/15/20 0500 08/17/20 0500 08/18/20 0500  Weight: 60.2 kg 62.7 kg 64 kg    History of present illness:  Brenda Hopkins 80 y.o.femalewith medical history significant forchronic diastolic CHF, emphysema, current smoker, anxiety, Roux-en-Y bypass in 2003, and recent admission with pneumonia and acute hypoxic respiratory failure, now presenting from her SNF for evaluation of upper abdominal pain, nausea, vomiting, and hematemesis. Patient was discharged from [facility] on 5/29/2022after treatment for pneumonia, but reports that she has been having upperabdominal pain, nausea, vomiting, and reports seeing blood in her vomit. She has not been able to eat or drink much in the past couple days due to the symptoms. In terms of the recent pneumonia, she continues to have productive cough but has not noticed any fevers or chills.  [facility], patient is found to be saturating99% on 5 L/min of supplemental oxygen heart rate and blood pressure. We will for an albumin of 2.1 and CBC features Brenda Hopkins hemoglobin of 8.7. Lactic acid was normal. Lipase was normal.  Patient was treated with Reglan, Zofran, IV fluids, and IV Protonix in the ED. She was transferred to [facility] for further evaluation and management.  She's now s/p EGD which showed gastritis, large, deep clean based ulcer at the anastomosis.  Plan is for BID PPI x3 months, then daily indefinitely.  Will need outpatient follow up of biopsies outpatient.   See below for additional details  Hospital Course:  1.Hematemesis -Patient with hx of Roux-en-Y in 2003 reports several days of upper abdominal pain and N/V with coffee-ground emesis - CT in ED with mild fluid distension of esophagus - ADAT -s/p EGD 6/3 with gastritis, roux en y gastrojejunostomy with gastrojejunal anastomosis char by ulceration (biopsied). Per GI, recommending IV PPI BID while in hospital, then PO protonix BID for 3 months, then protonix 40 mg once Brenda Hopkins day indefinitely.  FLD and ADAT.  Recommending follow up with primary GI after discharge.  - follow final biopsy results - mild chronic gastritis with reactive changes, no h. Pylori or intestinal metaplasia identified -She had EGD on 06/17/20 with ulceration and severe stenosis at Roux-en-Y anastomosis  # Epigastric Pain  Back Pain Noted yesterday, CT abdomen/pelvis without acute intraabdominal pathology identified - surgical changes of roux en y gastric bypass - previous noted distension and surrounding inflammatory change involving the gastric pouch has resolved.  Prominent reflux of contrast into the bilioenteric limb without significant dilation to suggest an afferent limb syndrome.  Interval resolution of edema involving herniated mesenteric fat within [place] Gains small supraumbilical ventral hernia.  No acute intraabdominal pathology identified.  No definite radiographic explanation for the patient's reported symptoms.  Interval dev of pneumobilia suggesting prior sphincterotomy.  No associated biliary  ductal dilation or periportal edema to suggest superimposed  cholangitis.  Stable severe anterior wedge compression fracture of T 0.  Remote fx of superior and inferior pubic raami.  Grade 2 anterolisthesis of L5 upon S1 s/p fusion with instrumentation.   T and L spine imaging without no htoracic spine fx.  Unchanged L5-S1 fusion with persistent grade 2 anterolisthesis at L5-S1.  Mild L4-5 facet arthritis. She's improved today  2.COPD; current smoker -Stable, continue as-needed duonebs -Not interested in quitting/cutting back on cigarettes at this time  3.Chronic diastolic CHF -Appears compensated -Continue beta-blocker as tolerated, hold Lasix for now given N/V  4.Pneumonia -Treated with IV antibiotics at [facility] recently and discharged 5/29 with 5 days of doxycycline  - She appears stable, CT from ED with radiographic improvement -has completed doxycycline   5.Anxiety -Continue Zoloft and Seroquel  6.Hypertension -Continue Norvasc and metoprolol as tolerated  Procedures:  EGD  Consultations:  GI  Discharge Exam: Vitals:   08/17/20 2035 08/18/20 0511  BP: (!) 142/76 (!) 115/59  Pulse: 62 (!) 59  Resp: 18 18  Temp: 97.8 F (36.6 C) 97.6 F (36.4 C)  SpO2: 95% 91%   Feeling better today Still with chronic back pain, abdominal pain improved  General: No acute distress. Cardiovascular: Heart sounds show Brenda Hopkins regular rate, and rhythm. Lungs: Clear to auscultation bilaterally Abdomen: Soft, nontender, nondistended No significant TTP of back  Neurological: Alert and oriented 3. Moves all extremities 4. Cranial nerves II through XII grossly intact. Skin: Warm and dry. No rashes or lesions. Extremities: No clubbing or cyanosis. No edema.    Discharge Instructions   Discharge Instructions    Call MD for:  difficulty breathing, headache or visual disturbances   Complete by: As directed    Call MD for:  extreme fatigue   Complete by: As directed    Call MD for:  hives   Complete by: As  directed    Call MD for:  persistant dizziness or light-headedness   Complete by: As directed    Call MD for:  persistant nausea and vomiting   Complete by: As directed    Call MD for:  redness, tenderness, or signs of infection (pain, swelling, redness, odor or green/yellow discharge around incision site)   Complete by: As directed    Call MD for:  severe uncontrolled pain   Complete by: As directed    Call MD for:  temperature >100.4   Complete by: As directed    Diet - low sodium heart healthy   Complete by: As directed    Diet - low sodium heart healthy   Complete by: As directed    Discharge instructions   Complete by: As directed    You were seen for coffee ground emesis (throwing up blood).    You had an endoscopy with GI which showed Brenda Hopkins large, deep, clean based ulcer at the anastomosis and gastritis.  They took biopsies which are pending today on the day of discharge.  You should take protonix 40 mg twice daily for the next 3 months, then take 40 mg daily of protonix indefinitely.  Avoid all NSAIDS (ibuprofen, naproxen, advil, aleve, etc).  Please follow up with your gastroenterologist regarding your pending biopsy results as an outpatient.  You've completed your treatment for pneumonia.  Follow with your PCP as an outpatient.  Return for new, recurrent, or worsening symptoms.  Please ask your PCP to request records from this hospitalization so they know what was done  and what the next steps will be.   Increase activity slowly   Complete by: As directed    Increase activity slowly   Complete by: As directed      Allergies as of 08/18/2020      Reactions   Prozac [fluoxetine Hcl] Other (See Comments)   Makes her fell crazy   Zoloft [sertraline Hcl]    Made her feel crazy--10/2013   Nicoderm [nicotine] Rash   Site rash      Medication List    STOP taking these medications   doxycycline 100 MG tablet Commonly known as: VIBRA-TABS   famotidine 10 MG tablet Commonly  known as: PEPCID   guaiFENesin 600 MG 12 hr tablet Commonly known as: MUCINEX   potassium chloride 10 MEQ tablet Commonly known as: KLOR-CON   sucralfate 1 g tablet Commonly known as: CARAFATE     TAKE these medications   amLODipine 10 MG tablet Commonly known as: NORVASC Take 0.5 tablets (5 mg total) by mouth daily.   atorvastatin 20 MG tablet Commonly known as: LIPITOR TAKE 1 TABLET BY MOUTH EVERY NIGHT AT BEDTIME What changed: when to take this   furosemide 20 MG tablet Commonly known as: LASIX Take 1 tablet (20 mg total) by mouth every other day.   gabapentin 600 MG tablet Commonly known as: NEURONTIN Take 1 tablet (600 mg total) by mouth 2 (two) times daily as needed (neuropathy pain).   ipratropium-albuterol 0.5-2.5 (3) MG/3ML Soln Commonly known as: DUONEB Take 3 mLs by nebulization 3 (three) times daily.   metoprolol tartrate 25 MG tablet Commonly known as: LOPRESSOR Take 1 tablet (25 mg total) by mouth 2 (two) times daily.   multivitamin with minerals tablet Take 1 tablet by mouth daily.   Niferex Tabs Take 1 tablet by mouth daily.   nystatin powder Commonly known as: MYCOSTATIN/NYSTOP Apply topically 2 (two) times daily. What changed:   how much to take  when to take this  reasons to take this   oxyCODONE 15 MG immediate release tablet Commonly known as: ROXICODONE Take 1 tablet (15 mg total) by mouth every 6 (six) hours as needed for up to 3 days for pain.   pantoprazole 40 MG tablet Commonly known as: Protonix Take 1 tablet (40 mg total) by mouth 2 (two) times daily. After 3 months, take daily indefinitely.   potassium chloride 10 MEQ tablet Commonly known as: KLOR-CON Take 1 tablet (10 mEq total) by mouth every other day.   QUEtiapine 200 MG tablet Commonly known as: SEROQUEL Take 200 mg by mouth at bedtime.   rOPINIRole 3 MG tablet Commonly known as: REQUIP Take 3 mg by mouth at bedtime.   saccharomyces boulardii 250 MG  capsule Commonly known as: FLORASTOR Take 1 capsule (250 mg total) by mouth 2 (two) times daily for 10 days.   sertraline 25 MG tablet Commonly known as: ZOLOFT Take 25 mg by mouth daily.      Allergies  Allergen Reactions  . Prozac [Fluoxetine Hcl] Other (See Comments)    Makes her fell crazy  . Zoloft [Sertraline Hcl]     Made her feel crazy--10/2013  . Nicoderm [Nicotine] Rash    Site rash      The results of significant diagnostics from this hospitalization (including imaging, microbiology, ancillary and laboratory) are listed below for reference.    Significant Diagnostic Studies: DG Thoracic Spine 2 View  Result Date: 08/18/2020 CLINICAL DATA:  Midline back pain EXAM: THORACIC SPINE 2 VIEWS;  LUMBAR SPINE - 2-3 VIEW COMPARISON:  CT 08/12/2020 FINDINGS: Thoracic spine: Unchanged chronic compression fracture of T9, with focal kyphosis at this level. There is no evidence of new thoracic spine fracture. There are multilevel degenerative changes above and below the chronic fracture. Lumbar spine: Unchanged L5-S1 fusion hardware with persistent grade 2 anterolisthesis at L5-S1. Intact hardware. Preserved disc heights above the fusion. Mild L4-L5 facet arthritis. IMPRESSION: Thoracic spine: Unchanged chronic compression fracture of T9 with focal kyphosis at this level. No evidence of new thoracic spine fracture. Lumbar spine: Unchanged L5-S1 fusion with persistent grade 2 anterolisthesis at L5-S1. Mild L4-L5 facet arthritis. Electronically Signed   By: Caprice RenshawJacob  Kahn   On: 08/18/2020 15:05   DG Lumbar Spine 2-3 Views  Result Date: 08/18/2020 CLINICAL DATA:  Midline back pain EXAM: THORACIC SPINE 2 VIEWS; LUMBAR SPINE - 2-3 VIEW COMPARISON:  CT 08/12/2020 FINDINGS: Thoracic spine: Unchanged chronic compression fracture of T9, with focal kyphosis at this level. There is no evidence of new thoracic spine fracture. There are multilevel degenerative changes above and below the chronic fracture.  Lumbar spine: Unchanged L5-S1 fusion hardware with persistent grade 2 anterolisthesis at L5-S1. Intact hardware. Preserved disc heights above the fusion. Mild L4-L5 facet arthritis. IMPRESSION: Thoracic spine: Unchanged chronic compression fracture of T9 with focal kyphosis at this level. No evidence of new thoracic spine fracture. Lumbar spine: Unchanged L5-S1 fusion with persistent grade 2 anterolisthesis at L5-S1. Mild L4-L5 facet arthritis. Electronically Signed   By: Caprice RenshawJacob  Kahn   On: 08/18/2020 15:05   DG Shoulder Right  Result Date: 07/27/2020 AP and scapular Y x-rays of the right shoulder were obtained in clinic today.  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  Closer look at the scapular Y x-ray demonstrates that the humeral head is abutting the glenoid, and it appears as though there is an anterior and superior glenoid deficiency.  Pression: Severe glenohumeral arthritis, with glenoid deficiency.  CT ANGIO CHEST PE W OR WO CONTRAST  Result Date: 08/07/2020 CLINICAL DATA:  Respiratory distress EXAM: CT ANGIOGRAPHY CHEST WITH CONTRAST TECHNIQUE: Multidetector CT imaging of the chest was performed using the standard protocol during bolus administration of intravenous contrast. Multiplanar CT image reconstructions and MIPs were obtained to evaluate the vascular anatomy. CONTRAST:  100mL OMNIPAQUE IOHEXOL 350 MG/ML SOLN COMPARISON:  Chest CT with contrast 06/20/2020 FINDINGS: Cardiovascular: Chronic cardiomegaly. No pericardial effusion. Extensive atheromatous calcification of the aorta and coronaries. Satisfactory opacification of the pulmonary arteries but limited by motion. No evidence of pulmonary embolism. Mediastinum/Nodes: Small sliding hiatal hernia with borderline circumferential thickening of the lower esophagus. No adenopathy, mass, or pneumomediastinum. Lungs/Pleura: Limited by motion artifact. There is streaky and  ground-glass airspace type opacity in the bilateral lungs, greatest in the left lower lobe. Some of this opacity appears acute based on prior CT. Upper Abdomen: Partially covered lobulated density in the ventral epigastrium is transverse colon. Musculoskeletal: Remote bilateral rib fractures. 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 Remote but unhealed T9 compression fracture with vertebra plana and gas containing central fracture cleft. Lucency at the sternum with fracture cleft which has been present since at least 2021. The central lucency is low-density and may reflect pseudoarthrosis with ganglion. Review of the MIP images confirms the above findings. IMPRESSION: 1. Patchy pneumonia with background atelectasis/scarring. 2. No evidence of pulmonary embolism. Sensitivity is diminished by motion artifact. 3. Chronic but unhealed T9 and sternal fractures. Remote rib fractures. 4.  Severe atherosclerosis. Electronically Signed   By: Marnee Spring M.D.   On: 08/07/2020 07:07   CT ABDOMEN PELVIS W CONTRAST  Result Date: 08/18/2020 CLINICAL DATA:  Acute nonlocalized abdominal pain, abdominal distension, nausea, vomiting, hematemesis. EXAM: CT ABDOMEN AND PELVIS WITH CONTRAST TECHNIQUE: Multidetector CT imaging of the abdomen and pelvis was performed using the standard protocol following bolus administration of intravenous contrast. CONTRAST:  OMNIPAQUE IOHEXOL 300 MG/ML  SOLN COMPARISON:  08/12/2020, 06/30/2020 FINDINGS: Lower chest: Bibasilar atelectasis. No pleural effusion. Extensive multi-vessel coronary artery calcification. Cardiac size is mildly enlarged. No pericardial effusion. Hepatobiliary: There is interval development of pneumobilia, new from multiple prior examinations. There is no associated biliary ductal dilation or periportal edema and this likely reflects the sequela of Marquetta Weiskopf prior sphincterotomy. The gallbladder is distended, however, no pericholecystic inflammatory changes identified.  The liver demonstrates mild hepatic steatosis, but is otherwise unremarkable. Pancreas: Unremarkable Spleen: Unremarkable Adrenals/Urinary Tract: Adrenal glands are unremarkable. Kidneys are normal, without renal calculi, focal lesion, or hydronephrosis. Bladder is unremarkable. Stomach/Bowel: Surgical changes of Roux-en-Y gastric bypass are identified. Previously noted distension of the gastric pouch has resolved. There is reflux of oral contrast into the bilioenteric limb, however, this does not appear dilated to suggest an afferent limb syndrome. There is moderate stool seen throughout the colon without evidence of obstruction. The small and large bowel are otherwise unremarkable. No free intraperitoneal gas or fluid. The appendix is not identified and is likely absent. Vascular/Lymphatic: Extensive aortoiliac atherosclerotic calcification. No aortic aneurysm. Retroaortic left renal vein. The abdominal vasculature is otherwise unremarkable. No pathologic adenopathy within the abdomen and pelvis. Reproductive: Status post hysterectomy. No adnexal masses. Other: Small fat containing supraumbilical ventral hernia is again identified with resolution of the previously noted edema within the herniated mesenteric fat on prior examination. Rectum unremarkable. Musculoskeletal: No acute bone abnormality. Remote fractures of the right superior and inferior pubic rami are again identified. Lumbosacral fusion with instrumentation with residual grade 2 anterolisthesis of L5 upon S1 is again noted. Severe anterior wedge compression fracture of T9 is again seen, unchanged from prior examination. IMPRESSION: Surgical changes of Roux-en-Y gastric bypass. Previously noted distension and surrounding inflammatory change involving the gastric pouch has resolved. Prominent reflux of contrast into the bilioenteric limb without significant dilation to suggest an afferent limb syndrome. Interval resolution of edema involving herniated  mesenteric fat within Brenda Hopkins small supraumbilical ventral hernia. No acute intra-abdominal pathology identified. No definite radiographic explanation for the patient's reported symptoms. Interval development of pneumobilia suggesting prior sphincterotomy. No associated biliary ductal dilation or periportal edema to suggest superimposed cholangitis. Stable severe anterior wedge compression fracture of T9, remote fractures of the right superior and inferior pubic rami, and grade 2 anterolisthesis of L5 upon S1 status post fusion with instrumentation. Aortic Atherosclerosis (ICD10-I70.0). Electronically Signed   By: Helyn Numbers MD   On: 08/18/2020 00:06   DG CHEST PORT 1 VIEW  Result Date: 08/17/2020 CLINICAL DATA:  Pain. EXAM: PORTABLE CHEST 1 VIEW COMPARISON:  Radiograph 08/08/2020.  CT 08/12/2020 FINDINGS: Stable heart size and mediastinal contours. Aortic atherosclerosis. Linear atelectasis or scarring. Improved left basilar aeration from prior exam. No new or progressive airspace disease. No pleural fluid or pneumothorax. Remote bilateral rib fractures. Chronic right shoulder arthropathy. IMPRESSION: Improving left basilar aeration from recent imaging. Bilateral atelectasis or scarring persists. Electronically Signed   By: Narda Rutherford M.D.   On: 08/17/2020 22:10   DG Chest Port 1 View  Result Date: 08/08/2020 CLINICAL DATA:  Pneumonia.  Short of breath. EXAM: PORTABLE CHEST 1 VIEW COMPARISON:  08/07/2020 and older studies. FINDINGS: Linear opacities noted at the lung bases, stable consistent with atelectasis. Remainder of the lungs is clear. Cardiac silhouette is stable.  No mediastinal or hilar masses. No evidence of Donica Derouin pleural effusion.  No pneumothorax. IMPRESSION: 1. No acute findings and no change from the previous day's exam. Electronically Signed   By: Amie Portland M.D.   On: 08/08/2020 10:19   DG Chest Port 1 View  Result Date: 08/07/2020 CLINICAL DATA:  Shortness of breath EXAM: PORTABLE  CHEST 1 VIEW COMPARISON:  07/04/2020 FINDINGS: Low volume chest with chronic streaky densities bilaterally. Remote right rib fractures with callus. Anterior subluxation of the right glenohumeral joint which is severely degenerated, similar to prior studies. Cardiomegaly. No edema, effusion, or pneumothorax. Bilateral skin folds. Artifact from EKG leads. IMPRESSION: Low volume chest with atelectasis/scarring that is similar to prior. No acute finding. Electronically Signed   By: Marnee Spring M.D.   On: 08/07/2020 04:57    Microbiology: Recent Results (from the past 240 hour(s))  SARS CORONAVIRUS 2 (TAT 6-24 HRS) Nasopharyngeal Nasopharyngeal Swab     Status: None   Collection Time: 08/13/20 10:44 PM   Specimen: Nasopharyngeal Swab  Result Value Ref Range Status   SARS Coronavirus 2 NEGATIVE NEGATIVE Final    Comment: (NOTE) SARS-CoV-2 target nucleic acids are NOT DETECTED.  The SARS-CoV-2 RNA is generally detectable in upper and lower respiratory specimens during the acute phase of infection. Negative results do not preclude SARS-CoV-2 infection, do not rule out co-infections with other pathogens, and should not be used as the sole basis for treatment or other patient management decisions. Negative results must be combined with clinical observations, patient history, and epidemiological information. The expected result is Negative.  Fact Sheet for Patients: HairSlick.no  Fact Sheet for Healthcare Providers: quierodirigir.com  This test is not yet approved or cleared by the [place] FDA and  has been authorized for detection and/or diagnosis of SARS-CoV-2 by FDA under an Emergency Use Authorization (EUA). This EUA will remain  in effect (meaning this test can be used) for the duration of the COVID-19 declaration under Se ction 564(b)(1) of the Act, 21 U.S.C. section 360bbb-3(b)(1), unless the authorization is terminated  or revoked sooner.  Performed at [facility], [address] [place], [place] [address]      Labs: Basic Metabolic Panel: Recent Labs  Lab 08/15/20 0613 08/16/20 0559  NA 137 137  K 3.6 4.7  CL 102 100  CO2 28 32  GLUCOSE 87 99  BUN 10 17  CREATININE 0.50 0.85  CALCIUM 8.4* 8.5*  MG 2.0 2.2  PHOS 4.1 4.4   Liver Function Tests: Recent Labs  Lab 08/15/20 0613 08/16/20 0559  AST 12* 12*  ALT 11 10  ALKPHOS 69 80  BILITOT 0.6 0.5  PROT 5.4* 6.0*  ALBUMIN 2.3* 2.5*   Recent Labs  Lab 08/16/20 0559 08/17/20 1927  LIPASE 26 24   No results for input(s): AMMONIA in the last 168 hours. CBC: Recent Labs  Lab 08/13/20 2305 08/14/20 1349 08/14/20 2150 08/15/20 0613 08/16/20 1006  WBC  --   --   --  4.3 4.6  NEUTROABS  --   --   --  2.0 2.4  HGB 8.6* 9.7* 8.2* 8.4* 9.2*  HCT 29.2* 33.5* 28.0* 30.9* 31.3*  MCV  --   --   --  97.8 89.7  PLT  --   --   --  376 411*   Cardiac Enzymes: No results for input(s): CKTOTAL, CKMB, CKMBINDEX, TROPONINI in the last 168 hours. BNP: BNP (last 3 results) Recent Labs    06/16/20 0454 06/17/20 0648 08/07/20 0442  BNP 198.0* 167.0* 188.0*    ProBNP (last 3 results) No results for input(s): PROBNP in the last 8760 hours.  CBG: No results for input(s): GLUCAP in the last 168 hours.     Signed:  Lacretia Nicks MD.  [facility] 08/18/2020, 3:24 PM

## 2020-08-18 NOTE — Plan of Care (Signed)
  Problem: Elimination: Goal: Will not experience complications related to bowel motility Outcome: Progressing   

## 2020-08-18 NOTE — [place] FL2 (Signed)
[place] MEDICAID FL2 LEVEL OF CARE SCREENING TOOL     IDENTIFICATION  Patient Name: Brenda Hopkins Birthdate: 03-Apr-1940 Sex: female Admission Date (Current Location): 08/13/2020  [facility] and [place] Number:  Producer, television/film/video and Address:  [facility],  [address] [place], [place] [address]      Provider Number: [id number]  Attending Physician Name and Address:  Zigmund Daniel., *  Relative Name and Phone Number:  Servando Snare (Daughter)   [phone]    Current Level of Care: Hospital Recommended Level of Care: Skilled Nursing Facility Prior Approval Number:    Date Approved/Denied:   PASRR Number:    Discharge Plan: SNF [facility])    Current Diagnoses: Patient Active Problem List   Diagnosis Date Noted  . Coffee ground emesis 08/13/2020  . Emphysema, unspecified (HCC) 08/13/2020  . HCAP (healthcare-associated pneumonia) 08/07/2020  . DNR (do not resuscitate) 08/07/2020  . CHF (congestive heart failure) (HCC)   . Pneumoperitoneum   . Hypokalemia 06/29/2020  . Falls 06/29/2020  . Physical deconditioning   . SIRS (systemic inflammatory response syndrome) (HCC) 06/19/2020  . Proctitis   . Acute metabolic encephalopathy 06/18/2020  . Hematochezia 06/18/2020  . Abdominal pain   . Anemia   . Chronic diastolic CHF (congestive heart failure) (HCC) 06/12/2020  . Gi Tract---Marginal ulcer 01/30/2020  . Noncompliance with medication regimen 01/30/2020  . AKI (acute kidney injury) (HCC) 01/30/2020  . Dehydration 01/30/2020  . Hypoalbuminemia 01/30/2020  . Obesity (BMI 30.0-34.9) 01/30/2020  . Nausea 01/30/2020  . Acute respiratory failure with hypoxia (HCC) 01/30/2020  . Epigastric pain 01/16/2020  . Ventral hernia without obstruction or gangrene   . Intractable epigastric abdominal pain 01/15/2020  . Insomnia 05/13/2014  . Generalized anxiety disorder 07/15/2013  . Hypertension   . Chronic back pain   . Hyperlipidemia   . GERD  (gastroesophageal reflux disease)   . Hiatal hernia   . Smoker   . Pelvic fracture (HCC) 12/12/2012    Orientation RESPIRATION BLADDER Height & Weight     Self,Situation  Normal External catheter Weight: 64 kg Height:  5' (152.4 cm)  BEHAVIORAL SYMPTOMS/MOOD NEUROLOGICAL BOWEL NUTRITION STATUS   (none)  (none) Incontinent Diet (see d/c summary)  AMBULATORY STATUS COMMUNICATION OF NEEDS Skin   Extensive Assist Verbally                         Personal Care Assistance Level of Assistance  Bathing,Feeding,Dressing Bathing Assistance: Maximum assistance Feeding assistance: Limited assistance Dressing Assistance: Maximum assistance     Functional Limitations Info  Sight,Hearing,Speech Sight Info: Adequate Hearing Info: Adequate Speech Info: Adequate    SPECIAL CARE FACTORS FREQUENCY  PT (By licensed PT)     PT Frequency: 5X/W              Contractures Contractures Info: Not present    Additional Factors Info  Code Status Code Status Info: DNR Allergies Info: Prozac, Zoloft, Nicoderm           Current Medications (08/18/2020):  This is the current hospital active medication list Current Facility-Administered Medications  Medication Dose Route Frequency Provider Last Rate Last Admin  . 0.9 %  sodium chloride infusion  250 mL Intravenous PRN Opyd, Lavone Neri, MD      . acetaminophen (TYLENOL) tablet 650 mg  650 mg Oral Q6H PRN Opyd, Lavone Neri, MD   650 mg at 08/17/20 1603   Or  . acetaminophen (TYLENOL)  suppository 650 mg  650 mg Rectal Q6H PRN Opyd, Lavone Neri, MD      . alum & mag hydroxide-simeth (MAALOX/MYLANTA) 200-200-20 MG/5ML suspension 30 mL  30 mL Oral Q4H PRN Zigmund Daniel., MD   30 mL at 08/15/20 2050  . amLODipine (NORVASC) tablet 5 mg  5 mg Oral Daily Opyd, Lavone Neri, MD   5 mg at 08/18/20 0956  . atorvastatin (LIPITOR) tablet 20 mg  20 mg Oral q1800 Opyd, Lavone Neri, MD   20 mg at 08/17/20 1742  . feeding supplement (BOOST / RESOURCE  BREEZE) liquid 1 Container  1 Container Oral Q24H Zigmund Daniel., MD   1 Container at 08/15/20 [phone]  . feeding supplement (ENSURE ENLIVE / ENSURE PLUS) liquid 237 mL  237 mL Oral BID BM Zigmund Daniel., MD   237 mL at 08/18/20 0957  . gabapentin (NEURONTIN) capsule 600 mg  600 mg Oral BID PRN Briscoe Deutscher, MD   600 mg at 08/17/20 1823  . guaiFENesin (MUCINEX) 12 hr tablet 1,200 mg  1,200 mg Oral BID Opyd, Lavone Neri, MD   1,200 mg at 08/18/20 0957  . ipratropium-albuterol (DUONEB) 0.5-2.5 (3) MG/3ML nebulizer solution 3 mL  3 mL Nebulization Q6H PRN Opyd, Lavone Neri, MD      . metoprolol tartrate (LOPRESSOR) tablet 25 mg  25 mg Oral BID Opyd, Lavone Neri, MD   25 mg at 08/17/20 2217  . multivitamin with minerals tablet 1 tablet  1 tablet Oral Daily Zigmund Daniel., MD   1 tablet at 08/18/20 [phone]  . ondansetron (ZOFRAN) tablet 4 mg  4 mg Oral Q6H PRN Opyd, Lavone Neri, MD       Or  . ondansetron (ZOFRAN) injection 4 mg  4 mg Intravenous Q6H PRN Opyd, Lavone Neri, MD      . oxyCODONE (Oxy IR/ROXICODONE) immediate release tablet 15 mg  15 mg Oral Q4H PRN Zigmund Daniel., MD   15 mg at 08/18/20 1007  . pantoprazole (PROTONIX) injection 40 mg  40 mg Intravenous Q12H Opyd, Lavone Neri, MD   40 mg at 08/18/20 0955  . polyethylene glycol (MIRALAX / GLYCOLAX) packet 17 g  17 g Oral BID Zigmund Daniel., MD      . QUEtiapine (SEROQUEL) tablet 200 mg  200 mg Oral QHS Opyd, Lavone Neri, MD   200 mg at 08/17/20 2216  . rOPINIRole (REQUIP) tablet 3 mg  3 mg Oral QHS Opyd, Lavone Neri, MD   3 mg at 08/17/20 2217  . saccharomyces boulardii (FLORASTOR) capsule 250 mg  250 mg Oral BID Briscoe Deutscher, MD   250 mg at 08/18/20 0956  . sertraline (ZOLOFT) tablet 25 mg  25 mg Oral Daily Opyd, Lavone Neri, MD   25 mg at 08/18/20 0957     Discharge Medications: Please see discharge summary for a list of discharge medications.  Relevant Imaging Results:  Relevant Lab Results:   Additional  Information Pt SSN [id number]  Ida Rogue, [place]

## 2020-08-19 DIAGNOSIS — R279 Unspecified lack of coordination: Secondary | ICD-10-CM | POA: Diagnosis not present

## 2020-08-19 DIAGNOSIS — N390 Urinary tract infection, site not specified: Secondary | ICD-10-CM | POA: Diagnosis not present

## 2020-08-19 DIAGNOSIS — R2689 Other abnormalities of gait and mobility: Secondary | ICD-10-CM | POA: Diagnosis not present

## 2020-08-19 DIAGNOSIS — M6281 Muscle weakness (generalized): Secondary | ICD-10-CM | POA: Diagnosis not present

## 2020-08-19 DIAGNOSIS — G9341 Metabolic encephalopathy: Secondary | ICD-10-CM | POA: Diagnosis not present

## 2020-08-19 DIAGNOSIS — K922 Gastrointestinal hemorrhage, unspecified: Secondary | ICD-10-CM | POA: Diagnosis not present

## 2020-08-20 DIAGNOSIS — R279 Unspecified lack of coordination: Secondary | ICD-10-CM | POA: Diagnosis not present

## 2020-08-20 DIAGNOSIS — F32A Depression, unspecified: Secondary | ICD-10-CM | POA: Diagnosis not present

## 2020-08-20 DIAGNOSIS — R52 Pain, unspecified: Secondary | ICD-10-CM | POA: Diagnosis not present

## 2020-08-20 DIAGNOSIS — K922 Gastrointestinal hemorrhage, unspecified: Secondary | ICD-10-CM | POA: Diagnosis not present

## 2020-08-20 DIAGNOSIS — G2581 Restless legs syndrome: Secondary | ICD-10-CM | POA: Diagnosis not present

## 2020-08-20 DIAGNOSIS — M6281 Muscle weakness (generalized): Secondary | ICD-10-CM | POA: Diagnosis not present

## 2020-08-20 DIAGNOSIS — N390 Urinary tract infection, site not specified: Secondary | ICD-10-CM | POA: Diagnosis not present

## 2020-08-20 DIAGNOSIS — I10 Essential (primary) hypertension: Secondary | ICD-10-CM | POA: Diagnosis not present

## 2020-08-20 DIAGNOSIS — R2689 Other abnormalities of gait and mobility: Secondary | ICD-10-CM | POA: Diagnosis not present

## 2020-08-20 DIAGNOSIS — Z79899 Other long term (current) drug therapy: Secondary | ICD-10-CM | POA: Diagnosis not present

## 2020-08-20 DIAGNOSIS — G9341 Metabolic encephalopathy: Secondary | ICD-10-CM | POA: Diagnosis not present

## 2020-08-20 DIAGNOSIS — R5381 Other malaise: Secondary | ICD-10-CM | POA: Diagnosis not present

## 2020-08-21 DIAGNOSIS — G9341 Metabolic encephalopathy: Secondary | ICD-10-CM | POA: Diagnosis not present

## 2020-08-21 DIAGNOSIS — N390 Urinary tract infection, site not specified: Secondary | ICD-10-CM | POA: Diagnosis not present

## 2020-08-21 DIAGNOSIS — R279 Unspecified lack of coordination: Secondary | ICD-10-CM | POA: Diagnosis not present

## 2020-08-21 DIAGNOSIS — R2689 Other abnormalities of gait and mobility: Secondary | ICD-10-CM | POA: Diagnosis not present

## 2020-08-21 DIAGNOSIS — M6281 Muscle weakness (generalized): Secondary | ICD-10-CM | POA: Diagnosis not present

## 2020-08-21 DIAGNOSIS — K922 Gastrointestinal hemorrhage, unspecified: Secondary | ICD-10-CM | POA: Diagnosis not present

## 2020-08-22 DIAGNOSIS — R2689 Other abnormalities of gait and mobility: Secondary | ICD-10-CM | POA: Diagnosis not present

## 2020-08-22 DIAGNOSIS — N390 Urinary tract infection, site not specified: Secondary | ICD-10-CM | POA: Diagnosis not present

## 2020-08-22 DIAGNOSIS — M6281 Muscle weakness (generalized): Secondary | ICD-10-CM | POA: Diagnosis not present

## 2020-08-22 DIAGNOSIS — G9341 Metabolic encephalopathy: Secondary | ICD-10-CM | POA: Diagnosis not present

## 2020-08-22 DIAGNOSIS — R279 Unspecified lack of coordination: Secondary | ICD-10-CM | POA: Diagnosis not present

## 2020-08-22 DIAGNOSIS — K922 Gastrointestinal hemorrhage, unspecified: Secondary | ICD-10-CM | POA: Diagnosis not present

## 2020-08-24 DIAGNOSIS — K922 Gastrointestinal hemorrhage, unspecified: Secondary | ICD-10-CM | POA: Diagnosis not present

## 2020-08-24 DIAGNOSIS — R279 Unspecified lack of coordination: Secondary | ICD-10-CM | POA: Diagnosis not present

## 2020-08-24 DIAGNOSIS — N390 Urinary tract infection, site not specified: Secondary | ICD-10-CM | POA: Diagnosis not present

## 2020-08-24 DIAGNOSIS — R2689 Other abnormalities of gait and mobility: Secondary | ICD-10-CM | POA: Diagnosis not present

## 2020-08-24 DIAGNOSIS — M6281 Muscle weakness (generalized): Secondary | ICD-10-CM | POA: Diagnosis not present

## 2020-08-24 DIAGNOSIS — G9341 Metabolic encephalopathy: Secondary | ICD-10-CM | POA: Diagnosis not present

## 2020-08-25 DIAGNOSIS — G9341 Metabolic encephalopathy: Secondary | ICD-10-CM | POA: Diagnosis not present

## 2020-08-25 DIAGNOSIS — N390 Urinary tract infection, site not specified: Secondary | ICD-10-CM | POA: Diagnosis not present

## 2020-08-25 DIAGNOSIS — K920 Hematemesis: Secondary | ICD-10-CM | POA: Diagnosis not present

## 2020-08-25 DIAGNOSIS — K219 Gastro-esophageal reflux disease without esophagitis: Secondary | ICD-10-CM | POA: Diagnosis not present

## 2020-08-25 DIAGNOSIS — K922 Gastrointestinal hemorrhage, unspecified: Secondary | ICD-10-CM | POA: Diagnosis not present

## 2020-08-25 DIAGNOSIS — R279 Unspecified lack of coordination: Secondary | ICD-10-CM | POA: Diagnosis not present

## 2020-08-25 DIAGNOSIS — M6281 Muscle weakness (generalized): Secondary | ICD-10-CM | POA: Diagnosis not present

## 2020-08-25 DIAGNOSIS — R2689 Other abnormalities of gait and mobility: Secondary | ICD-10-CM | POA: Diagnosis not present

## 2020-08-26 DIAGNOSIS — R2689 Other abnormalities of gait and mobility: Secondary | ICD-10-CM | POA: Diagnosis not present

## 2020-08-26 DIAGNOSIS — K922 Gastrointestinal hemorrhage, unspecified: Secondary | ICD-10-CM | POA: Diagnosis not present

## 2020-08-26 DIAGNOSIS — G9341 Metabolic encephalopathy: Secondary | ICD-10-CM | POA: Diagnosis not present

## 2020-08-26 DIAGNOSIS — M6281 Muscle weakness (generalized): Secondary | ICD-10-CM | POA: Diagnosis not present

## 2020-08-26 DIAGNOSIS — R279 Unspecified lack of coordination: Secondary | ICD-10-CM | POA: Diagnosis not present

## 2020-08-26 DIAGNOSIS — N390 Urinary tract infection, site not specified: Secondary | ICD-10-CM | POA: Diagnosis not present

## 2020-08-27 DIAGNOSIS — R2689 Other abnormalities of gait and mobility: Secondary | ICD-10-CM | POA: Diagnosis not present

## 2020-08-27 DIAGNOSIS — G9341 Metabolic encephalopathy: Secondary | ICD-10-CM | POA: Diagnosis not present

## 2020-08-27 DIAGNOSIS — M6281 Muscle weakness (generalized): Secondary | ICD-10-CM | POA: Diagnosis not present

## 2020-08-27 DIAGNOSIS — K922 Gastrointestinal hemorrhage, unspecified: Secondary | ICD-10-CM | POA: Diagnosis not present

## 2020-08-27 DIAGNOSIS — R279 Unspecified lack of coordination: Secondary | ICD-10-CM | POA: Diagnosis not present

## 2020-08-27 DIAGNOSIS — N390 Urinary tract infection, site not specified: Secondary | ICD-10-CM | POA: Diagnosis not present

## 2020-08-29 DIAGNOSIS — N390 Urinary tract infection, site not specified: Secondary | ICD-10-CM | POA: Diagnosis not present

## 2020-08-29 DIAGNOSIS — R2689 Other abnormalities of gait and mobility: Secondary | ICD-10-CM | POA: Diagnosis not present

## 2020-08-29 DIAGNOSIS — G9341 Metabolic encephalopathy: Secondary | ICD-10-CM | POA: Diagnosis not present

## 2020-08-29 DIAGNOSIS — K922 Gastrointestinal hemorrhage, unspecified: Secondary | ICD-10-CM | POA: Diagnosis not present

## 2020-08-29 DIAGNOSIS — M6281 Muscle weakness (generalized): Secondary | ICD-10-CM | POA: Diagnosis not present

## 2020-08-29 DIAGNOSIS — R279 Unspecified lack of coordination: Secondary | ICD-10-CM | POA: Diagnosis not present

## 2020-08-31 DIAGNOSIS — G2581 Restless legs syndrome: Secondary | ICD-10-CM | POA: Diagnosis not present

## 2020-08-31 DIAGNOSIS — R5381 Other malaise: Secondary | ICD-10-CM | POA: Diagnosis not present

## 2020-08-31 DIAGNOSIS — R52 Pain, unspecified: Secondary | ICD-10-CM | POA: Diagnosis not present

## 2020-08-31 DIAGNOSIS — Z79899 Other long term (current) drug therapy: Secondary | ICD-10-CM | POA: Diagnosis not present

## 2020-09-01 DIAGNOSIS — R279 Unspecified lack of coordination: Secondary | ICD-10-CM | POA: Diagnosis not present

## 2020-09-01 DIAGNOSIS — R2689 Other abnormalities of gait and mobility: Secondary | ICD-10-CM | POA: Diagnosis not present

## 2020-09-01 DIAGNOSIS — K922 Gastrointestinal hemorrhage, unspecified: Secondary | ICD-10-CM | POA: Diagnosis not present

## 2020-09-01 DIAGNOSIS — N390 Urinary tract infection, site not specified: Secondary | ICD-10-CM | POA: Diagnosis not present

## 2020-09-01 DIAGNOSIS — G9341 Metabolic encephalopathy: Secondary | ICD-10-CM | POA: Diagnosis not present

## 2020-09-01 DIAGNOSIS — M6281 Muscle weakness (generalized): Secondary | ICD-10-CM | POA: Diagnosis not present

## 2020-09-21 DIAGNOSIS — K219 Gastro-esophageal reflux disease without esophagitis: Secondary | ICD-10-CM | POA: Diagnosis not present

## 2020-09-21 DIAGNOSIS — F32A Depression, unspecified: Secondary | ICD-10-CM | POA: Diagnosis not present

## 2020-09-21 DIAGNOSIS — E785 Hyperlipidemia, unspecified: Secondary | ICD-10-CM | POA: Diagnosis not present

## 2020-09-21 DIAGNOSIS — G2581 Restless legs syndrome: Secondary | ICD-10-CM | POA: Diagnosis not present

## 2020-09-21 DIAGNOSIS — I10 Essential (primary) hypertension: Secondary | ICD-10-CM | POA: Diagnosis not present

## 2020-10-19 DIAGNOSIS — F32A Depression, unspecified: Secondary | ICD-10-CM | POA: Diagnosis not present

## 2020-10-19 DIAGNOSIS — R5381 Other malaise: Secondary | ICD-10-CM | POA: Diagnosis not present

## 2020-10-19 DIAGNOSIS — E785 Hyperlipidemia, unspecified: Secondary | ICD-10-CM | POA: Diagnosis not present

## 2020-10-19 DIAGNOSIS — K219 Gastro-esophageal reflux disease without esophagitis: Secondary | ICD-10-CM | POA: Diagnosis not present

## 2020-10-19 DIAGNOSIS — I10 Essential (primary) hypertension: Secondary | ICD-10-CM | POA: Diagnosis not present

## 2020-10-19 DIAGNOSIS — R52 Pain, unspecified: Secondary | ICD-10-CM | POA: Diagnosis not present

## 2020-10-19 DIAGNOSIS — Z79899 Other long term (current) drug therapy: Secondary | ICD-10-CM | POA: Diagnosis not present

## 2020-10-26 DIAGNOSIS — Z79899 Other long term (current) drug therapy: Secondary | ICD-10-CM | POA: Diagnosis not present

## 2020-10-26 DIAGNOSIS — R5381 Other malaise: Secondary | ICD-10-CM | POA: Diagnosis not present

## 2020-10-26 DIAGNOSIS — I10 Essential (primary) hypertension: Secondary | ICD-10-CM | POA: Diagnosis not present

## 2020-10-26 DIAGNOSIS — G2581 Restless legs syndrome: Secondary | ICD-10-CM | POA: Diagnosis not present

## 2020-10-26 DIAGNOSIS — R52 Pain, unspecified: Secondary | ICD-10-CM | POA: Diagnosis not present

## 2020-10-26 DIAGNOSIS — F32A Depression, unspecified: Secondary | ICD-10-CM | POA: Diagnosis not present

## 2020-10-27 DIAGNOSIS — E785 Hyperlipidemia, unspecified: Secondary | ICD-10-CM | POA: Diagnosis not present

## 2020-10-27 DIAGNOSIS — R600 Localized edema: Secondary | ICD-10-CM | POA: Diagnosis not present

## 2020-10-27 DIAGNOSIS — I10 Essential (primary) hypertension: Secondary | ICD-10-CM | POA: Diagnosis not present

## 2020-11-05 DIAGNOSIS — F32A Depression, unspecified: Secondary | ICD-10-CM | POA: Diagnosis not present

## 2020-11-05 DIAGNOSIS — Z79899 Other long term (current) drug therapy: Secondary | ICD-10-CM | POA: Diagnosis not present

## 2020-11-05 DIAGNOSIS — M19011 Primary osteoarthritis, right shoulder: Secondary | ICD-10-CM | POA: Diagnosis not present

## 2020-11-05 DIAGNOSIS — R5381 Other malaise: Secondary | ICD-10-CM | POA: Diagnosis not present

## 2020-11-05 DIAGNOSIS — G2581 Restless legs syndrome: Secondary | ICD-10-CM | POA: Diagnosis not present

## 2020-11-05 DIAGNOSIS — R52 Pain, unspecified: Secondary | ICD-10-CM | POA: Diagnosis not present

## 2020-11-09 DIAGNOSIS — G2581 Restless legs syndrome: Secondary | ICD-10-CM | POA: Diagnosis not present

## 2020-11-09 DIAGNOSIS — R600 Localized edema: Secondary | ICD-10-CM | POA: Diagnosis not present

## 2020-11-09 DIAGNOSIS — R5381 Other malaise: Secondary | ICD-10-CM | POA: Diagnosis not present

## 2020-11-09 DIAGNOSIS — Z79899 Other long term (current) drug therapy: Secondary | ICD-10-CM | POA: Diagnosis not present

## 2020-11-09 DIAGNOSIS — K219 Gastro-esophageal reflux disease without esophagitis: Secondary | ICD-10-CM | POA: Diagnosis not present

## 2020-11-19 DIAGNOSIS — I10 Essential (primary) hypertension: Secondary | ICD-10-CM | POA: Diagnosis not present

## 2020-11-19 DIAGNOSIS — G894 Chronic pain syndrome: Secondary | ICD-10-CM | POA: Diagnosis not present

## 2020-11-22 ENCOUNTER — Emergency Department (HOSPITAL_COMMUNITY): Payer: Medicare Other

## 2020-11-22 ENCOUNTER — Inpatient Hospital Stay (HOSPITAL_COMMUNITY)
Admission: EM | Admit: 2020-11-22 | Discharge: 2020-12-12 | DRG: 871 | Disposition: E | Payer: Medicare Other | Attending: Internal Medicine | Admitting: Internal Medicine

## 2020-11-22 ENCOUNTER — Encounter (HOSPITAL_COMMUNITY): Payer: Self-pay | Admitting: Emergency Medicine

## 2020-11-22 ENCOUNTER — Other Ambulatory Visit: Payer: Self-pay

## 2020-11-22 DIAGNOSIS — J9601 Acute respiratory failure with hypoxia: Secondary | ICD-10-CM | POA: Diagnosis not present

## 2020-11-22 DIAGNOSIS — K668 Other specified disorders of peritoneum: Secondary | ICD-10-CM

## 2020-11-22 DIAGNOSIS — J439 Emphysema, unspecified: Secondary | ICD-10-CM | POA: Diagnosis present

## 2020-11-22 DIAGNOSIS — I499 Cardiac arrhythmia, unspecified: Secondary | ICD-10-CM | POA: Diagnosis not present

## 2020-11-22 DIAGNOSIS — G9341 Metabolic encephalopathy: Secondary | ICD-10-CM | POA: Diagnosis not present

## 2020-11-22 DIAGNOSIS — K659 Peritonitis, unspecified: Secondary | ICD-10-CM | POA: Diagnosis present

## 2020-11-22 DIAGNOSIS — Z452 Encounter for adjustment and management of vascular access device: Secondary | ICD-10-CM

## 2020-11-22 DIAGNOSIS — L89892 Pressure ulcer of other site, stage 2: Secondary | ICD-10-CM | POA: Diagnosis present

## 2020-11-22 DIAGNOSIS — Z87891 Personal history of nicotine dependence: Secondary | ICD-10-CM | POA: Diagnosis not present

## 2020-11-22 DIAGNOSIS — L8990 Pressure ulcer of unspecified site, unspecified stage: Secondary | ICD-10-CM | POA: Insufficient documentation

## 2020-11-22 DIAGNOSIS — S32501A Unspecified fracture of right pubis, initial encounter for closed fracture: Secondary | ICD-10-CM | POA: Diagnosis not present

## 2020-11-22 DIAGNOSIS — Z79899 Other long term (current) drug therapy: Secondary | ICD-10-CM

## 2020-11-22 DIAGNOSIS — Z515 Encounter for palliative care: Secondary | ICD-10-CM | POA: Diagnosis not present

## 2020-11-22 DIAGNOSIS — Z20822 Contact with and (suspected) exposure to covid-19: Secondary | ICD-10-CM | POA: Diagnosis not present

## 2020-11-22 DIAGNOSIS — R198 Other specified symptoms and signs involving the digestive system and abdomen: Principal | ICD-10-CM

## 2020-11-22 DIAGNOSIS — K219 Gastro-esophageal reflux disease without esophagitis: Secondary | ICD-10-CM | POA: Diagnosis not present

## 2020-11-22 DIAGNOSIS — N179 Acute kidney failure, unspecified: Secondary | ICD-10-CM | POA: Diagnosis not present

## 2020-11-22 DIAGNOSIS — I5032 Chronic diastolic (congestive) heart failure: Secondary | ICD-10-CM | POA: Diagnosis not present

## 2020-11-22 DIAGNOSIS — E785 Hyperlipidemia, unspecified: Secondary | ICD-10-CM | POA: Diagnosis present

## 2020-11-22 DIAGNOSIS — Z9884 Bariatric surgery status: Secondary | ICD-10-CM

## 2020-11-22 DIAGNOSIS — R6889 Other general symptoms and signs: Secondary | ICD-10-CM | POA: Diagnosis not present

## 2020-11-22 DIAGNOSIS — Z66 Do not resuscitate: Secondary | ICD-10-CM | POA: Diagnosis present

## 2020-11-22 DIAGNOSIS — R0602 Shortness of breath: Secondary | ICD-10-CM | POA: Diagnosis not present

## 2020-11-22 DIAGNOSIS — K631 Perforation of intestine (nontraumatic): Secondary | ICD-10-CM | POA: Diagnosis present

## 2020-11-22 DIAGNOSIS — R571 Hypovolemic shock: Secondary | ICD-10-CM | POA: Diagnosis not present

## 2020-11-22 DIAGNOSIS — R4020 Unspecified coma: Secondary | ICD-10-CM | POA: Diagnosis not present

## 2020-11-22 DIAGNOSIS — Z888 Allergy status to other drugs, medicaments and biological substances status: Secondary | ICD-10-CM

## 2020-11-22 DIAGNOSIS — K429 Umbilical hernia without obstruction or gangrene: Secondary | ICD-10-CM | POA: Diagnosis not present

## 2020-11-22 DIAGNOSIS — A419 Sepsis, unspecified organism: Secondary | ICD-10-CM | POA: Diagnosis not present

## 2020-11-22 DIAGNOSIS — K439 Ventral hernia without obstruction or gangrene: Secondary | ICD-10-CM | POA: Diagnosis not present

## 2020-11-22 DIAGNOSIS — I110 Hypertensive heart disease with heart failure: Secondary | ICD-10-CM | POA: Diagnosis not present

## 2020-11-22 DIAGNOSIS — R6521 Severe sepsis with septic shock: Secondary | ICD-10-CM | POA: Diagnosis present

## 2020-11-22 DIAGNOSIS — R509 Fever, unspecified: Secondary | ICD-10-CM | POA: Diagnosis not present

## 2020-11-22 DIAGNOSIS — L89611 Pressure ulcer of right heel, stage 1: Secondary | ICD-10-CM | POA: Diagnosis present

## 2020-11-22 DIAGNOSIS — I10 Essential (primary) hypertension: Secondary | ICD-10-CM | POA: Diagnosis not present

## 2020-11-22 DIAGNOSIS — Z743 Need for continuous supervision: Secondary | ICD-10-CM | POA: Diagnosis not present

## 2020-11-22 DIAGNOSIS — I700 Atherosclerosis of aorta: Secondary | ICD-10-CM | POA: Diagnosis present

## 2020-11-22 DIAGNOSIS — Z7189 Other specified counseling: Secondary | ICD-10-CM

## 2020-11-22 DIAGNOSIS — R404 Transient alteration of awareness: Secondary | ICD-10-CM | POA: Diagnosis not present

## 2020-11-22 DIAGNOSIS — R4182 Altered mental status, unspecified: Secondary | ICD-10-CM | POA: Diagnosis present

## 2020-11-22 LAB — COMPREHENSIVE METABOLIC PANEL
ALT: 29 U/L (ref 0–44)
AST: 32 U/L (ref 15–41)
Albumin: 1.7 g/dL — ABNORMAL LOW (ref 3.5–5.0)
Alkaline Phosphatase: 96 U/L (ref 38–126)
Anion gap: 8 (ref 5–15)
BUN: 66 mg/dL — ABNORMAL HIGH (ref 8–23)
CO2: 25 mmol/L (ref 22–32)
Calcium: 7.5 mg/dL — ABNORMAL LOW (ref 8.9–10.3)
Chloride: 103 mmol/L (ref 98–111)
Creatinine, Ser: 2.1 mg/dL — ABNORMAL HIGH (ref 0.44–1.00)
GFR, Estimated: 24 mL/min — ABNORMAL LOW (ref >60)
Glucose, Bld: 122 mg/dL — ABNORMAL HIGH (ref 70–99)
Potassium: 3.8 mmol/L (ref 3.5–5.1)
Sodium: 136 mmol/L (ref 135–145)
Total Bilirubin: 0.7 mg/dL (ref 0.3–1.2)
Total Protein: 5.4 g/dL — ABNORMAL LOW (ref 6.5–8.1)

## 2020-11-22 LAB — BLOOD GAS, ARTERIAL
Acid-base deficit: 4.5 mmol/L — ABNORMAL HIGH (ref 0.0–2.0)
Bicarbonate: 20.9 mmol/L (ref 20.0–28.0)
FIO2: 100
O2 Saturation: 98.3 %
Patient temperature: 37
pCO2 arterial: 34.9 mmHg (ref 32.0–48.0)
pH, Arterial: 7.374 (ref 7.350–7.450)
pO2, Arterial: 194 mmHg — ABNORMAL HIGH (ref 83.0–108.0)

## 2020-11-22 LAB — LACTIC ACID, PLASMA
Lactic Acid, Venous: 3.6 mmol/L (ref 0.5–1.9)
Lactic Acid, Venous: 3.3 mmol/L (ref 0.5–1.9)

## 2020-11-22 LAB — PROTIME-INR
INR: 1.1 (ref 0.8–1.2)
Prothrombin Time: 13.9 seconds (ref 11.4–15.2)

## 2020-11-22 LAB — CBC WITH DIFFERENTIAL/PLATELET
Abs Immature Granulocytes: 0.01 10*3/uL (ref 0.00–0.07)
Basophils Absolute: 0 10*3/uL (ref 0.0–0.1)
Basophils Relative: 1 %
Eosinophils Absolute: 0 10*3/uL (ref 0.0–0.5)
Eosinophils Relative: 0 %
HCT: 42.8 % (ref 36.0–46.0)
Hemoglobin: 13.8 g/dL (ref 12.0–15.0)
Immature Granulocytes: 0 %
Lymphocytes Relative: 22 %
Lymphs Abs: 0.7 10*3/uL (ref 0.7–4.0)
MCH: 28.6 pg (ref 26.0–34.0)
MCHC: 32.2 g/dL (ref 30.0–36.0)
MCV: 88.8 fL (ref 80.0–100.0)
Monocytes Absolute: 0.1 10*3/uL (ref 0.1–1.0)
Monocytes Relative: 4 %
Neutro Abs: 2.4 10*3/uL (ref 1.7–7.7)
Neutrophils Relative %: 73 %
Platelets: 361 10*3/uL (ref 150–400)
RBC: 4.82 MIL/uL (ref 3.87–5.11)
RDW: 19.2 % — ABNORMAL HIGH (ref 11.5–15.5)
WBC: 3.3 10*3/uL — ABNORMAL LOW (ref 4.0–10.5)
nRBC: 0 % (ref 0.0–0.2)

## 2020-11-22 LAB — RESP PANEL BY RT-PCR (FLU A&B, COVID) ARPGX2
Influenza A by PCR: NEGATIVE
Influenza B by PCR: NEGATIVE
SARS Coronavirus 2 by RT PCR: NEGATIVE

## 2020-11-22 LAB — APTT: aPTT: 24 seconds (ref 24–36)

## 2020-11-22 LAB — BRAIN NATRIURETIC PEPTIDE: B Natriuretic Peptide: 189 pg/mL — ABNORMAL HIGH (ref 0.0–100.0)

## 2020-11-22 LAB — CBG MONITORING, ED: Glucose-Capillary: 93 mg/dL (ref 70–99)

## 2020-11-22 MED ORDER — LACTATED RINGERS IV BOLUS (SEPSIS)
1000.0000 mL | Freq: Once | INTRAVENOUS | Status: AC
  Administered 2020-11-22: 1000 mL via INTRAVENOUS

## 2020-11-22 MED ORDER — VANCOMYCIN HCL 750 MG/150ML IV SOLN: 750.0000 mg | INTRAVENOUS | Status: DC

## 2020-11-22 MED ORDER — LACTATED RINGERS IV BOLUS
1000.0000 mL | Freq: Once | INTRAVENOUS | Status: AC
  Administered 2020-11-22: 1000 mL via INTRAVENOUS

## 2020-11-22 MED ORDER — PANTOPRAZOLE SODIUM 40 MG IV SOLR
40.0000 mg | Freq: Once | INTRAVENOUS | Status: AC
  Administered 2020-11-22: 40 mg via INTRAVENOUS
  Filled 2020-11-22: qty 40

## 2020-11-22 MED ORDER — HYDROCORTISONE SOD SUC (PF) 250 MG IJ SOLR
150.0000 mg | Freq: Once | INTRAMUSCULAR | Status: AC
  Administered 2020-11-22: 150 mg via INTRAVENOUS
  Filled 2020-11-22: qty 150

## 2020-11-22 MED ORDER — SODIUM CHLORIDE 0.9 % IV SOLN
2.0000 g | Freq: Once | INTRAVENOUS | Status: AC
  Administered 2020-11-22: 2 g via INTRAVENOUS
  Filled 2020-11-22: qty 2

## 2020-11-22 MED ORDER — VANCOMYCIN HCL IN DEXTROSE 1-5 GM/200ML-% IV SOLN
1000.0000 mg | Freq: Once | INTRAVENOUS | Status: AC
  Administered 2020-11-22: 1000 mg via INTRAVENOUS
  Filled 2020-11-22: qty 200

## 2020-11-22 MED ORDER — SODIUM CHLORIDE 0.9 % IV SOLN: 2.0000 g | Freq: Two times a day (BID) | INTRAVENOUS | Status: DC

## 2020-11-22 MED ORDER — SODIUM CHLORIDE 0.9 % IV SOLN: INTRAVENOUS | Status: DC

## 2020-11-22 MED ORDER — METRONIDAZOLE 500 MG/100ML IV SOLN
500.0000 mg | Freq: Once | INTRAVENOUS | Status: AC
  Administered 2020-11-22: 500 mg via INTRAVENOUS
  Filled 2020-11-22: qty 100

## 2020-11-22 MED ORDER — NOREPINEPHRINE 4 MG/250ML-% IV SOLN
0.0000 ug/min | INTRAVENOUS | Status: DC
Start: 2020-11-22 — End: 2020-11-24
  Administered 2020-11-22: 2 ug/min via INTRAVENOUS
  Administered 2020-11-23: 33 ug/min via INTRAVENOUS
  Administered 2020-11-23 (×2): 28 ug/min via INTRAVENOUS
  Administered 2020-11-23: 33 ug/min via INTRAVENOUS
  Administered 2020-11-23: 30 ug/min via INTRAVENOUS
  Administered 2020-11-23: 28 ug/min via INTRAVENOUS
  Administered 2020-11-23 (×2): 33 ug/min via INTRAVENOUS
  Administered 2020-11-24 (×3): 34 ug/min via INTRAVENOUS
  Filled 2020-11-22 (×7): qty 250
  Filled 2020-11-22: qty 750
  Filled 2020-11-22 (×7): qty 250

## 2020-11-22 NOTE — H&P (Addendum)
History and Physical    Ivar DrapeBrenda Gail Sterling ZOX:[id number] DOB: 09/04/1940 DOA: 11/30/2020  PCP: Shawnie DapperMann, Benjamin L, PA-C   Patient coming from: Home  I have personally briefly reviewed patient's old medical records in [facility]  CC: abd pain HPI: 80 year old female with history of congestive heart failure, chronic diastolic heart failure, obesity, emphysema who presents to the hospital today with a 1 day history of abdominal pain.  Patient unable to give any history or review of systems due to her critical nature.  Patient's son Boyd KerbsScott Skidmore is at the bedside.  He states that the patient was in decent health today.  He states the patient did get up and eat breakfast.  Around lunchtime he went to check on her.  He states the patient was still awake.  Around 3:00 he went back to check on her and she was disoriented.  She complained of abdominal pain.  He states that she did not look well.  Patient brought to the ER.  In the ER, patient noted to have lactic acidosis with a lactic acid of 3.6.  White count low was 3.3.  Chest x-ray performed demonstrated free air underneath the right hemidiaphragm compatible with perforated viscus.  CT scan of the abdomen pelvis performed which showed large pneumoperitoneum concerning for bowel perforation.  General surgery was consulted.  Patient seen by Dr. Lovell SheehanJenkins.  Dr. Lovell SheehanJenkins and the family discussed that she would do poorly with surgery.  Family understands that the patient would likely not survive surgery.  Patient is ready a DNR.  Family wanted to keep her comfortable.  The patient's daughter is a flight attendant and will not be back into town until tomorrow.  [facility] hospital[facility] contacted for admission to the ICU due to the patient's perforated viscus.  I had a conversation with the patient's son-in-law Boyd KerbsScott Skidmore outside the patient's room.  He states that he understands the patient will not survive but wants to try to keep the patient alive as  long as possible in order for the daughter to be able to make it back into town so she can say goodbye to the patient.  Son-in-law gave verbal and written consent for central line placement.  Son-in-law understands that patient may die at any point but we will make every attempt to try to keep the patient alive with using IV Levophed and steroids try to bolster her blood pressure until the daughter can make it back into town.  Son-in-law understands that these are only temporary measures and the patient will die from her overwhelming septic shock but we may be able to keep the patient alive until her daughter can say goodbye.   ED Course: PCXR showed free air under diaphragm. CT abd confirmed pneumoperitoneum. General surgery consulted. Family does not want surgery. They understand pt will die.  Review of Systems:  Review of Systems  Unable to perform ROS: Critical illness   Past Medical History:  Diagnosis Date   CHF (congestive heart failure) (HCC)    Chronic back pain    GERD (gastroesophageal reflux disease)    Hiatal hernia    Hyperlipidemia    Hypertension    Pelvic fracture (HCC) 12/12/2012   Smoker     Past Surgical History:  Procedure Laterality Date   ABDOMINAL HYSTERECTOMY     APPENDECTOMY     BALLOON DILATION N/A 06/17/2020   Procedure: BALLOON DILATION;  Surgeon: Malissa Hippoehman, Najeeb U, MD;  Location: [facility];  Service: Endoscopy;  Laterality:  N/A;   BIOPSY  08/14/2020   Procedure: BIOPSY;  Surgeon: Kathi Der, MD;  Location: [facility] END[facility]COPY;  Service: Gastroenterology;;   ESOPHAGOGASTRODUODENOSCOPY N/A 08/14/2020   Procedure: ESOPHAGOGASTRODUODENOSCOPY (EGD);  Surgeon: Kathi Der, MD;  Location: [facility];  Service: Gastroenterology;  Laterality: N/A;   ESOPHAGOGASTRODUODENOSCOPY (EGD) WITH PROPOFOL N/A 01/17/2020   Procedure: ESOPHAGOGASTRODUODENOSCOPY (EGD) WITH PROPOFOL;  Surgeon: Dolores Frame, MD;  Location: [facility];  Service:  Gastroenterology;  Laterality: N/A;   ESOPHAGOGASTRODUODENOSCOPY (EGD) WITH PROPOFOL N/A 06/17/2020   Procedure: ESOPHAGOGASTRODUODENOSCOPY (EGD) WITH PROPOFOL;  Surgeon: Malissa Hippo, MD;  Location: [facility];  Service: Endoscopy;  Laterality: N/A;   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 Her smoking use included cigarettes. She smoked an average of .5 packs per day. She quit smokeless tobacco use about 7 years ago. She reports that she does not drink alcohol and does not use drugs.  Allergies  Allergen Reactions   Prozac [Fluoxetine Hcl] Other (See Comments)    Makes her fell crazy   Zoloft [Sertraline Hcl]     Made her feel crazy--10/2013   Nicoderm [Nicotine] Rash    Site rash    History reviewed. No pertinent family history.  Prior to Admission medications   Medication Sig Start Date End Date Taking? Authorizing Provider  amLODipine (NORVASC) 5 MG tablet Take 5 mg by mouth daily. 11/11/20  Yes [provider]  atorvastatin (LIPITOR) 20 MG tablet TAKE 1 TABLET BY MOUTH EVERY NIGHT AT BEDTIME Patient taking differently: Take 20 mg by mouth daily. 07/11/16  Yes Allayne Butcher B, PA-C  furosemide (LASIX) 20 MG tablet Take 1 tablet (20 mg total) by mouth every other day. 08/12/20  Yes Johnson, Clanford L, MD  ipratropium-albuterol (DUONEB) 0.5-2.5 (3) MG/3ML SOLN Take 3 mLs by nebulization 3 (three) times daily. 08/09/20  Yes Johnson, Clanford L, MD  metoprolol tartrate (LOPRESSOR) 25 MG tablet Take 1 tablet (25 mg total) by mouth 2 (two) times daily. 06/24/20  Yes Vassie Loll, MD  omeprazole (PRILOSEC) 20 MG capsule Take 20 mg by mouth 2 (two) times daily. 10/24/20  Yes [provider]  oxyCODONE (ROXICODONE) 15 MG immediate release tablet Take 15 mg by mouth 4 (four) times daily as needed. 11/19/20  Yes [provider]  potassium chloride SA (KLOR-CON) 10  MEQ tablet Take 1 tablet (10 mEq total) by mouth every other day. 08/12/20  Yes Johnson, Clanford L, MD  QUEtiapine (SEROQUEL) 200 MG tablet Take 200 mg by mouth at bedtime. 10/22/19  Yes [provider]  rOPINIRole (REQUIP) 3 MG tablet Take 3 mg by mouth at bedtime. 10/23/19  Yes [provider]  gabapentin (NEURONTIN) 600 MG tablet Take 1 tablet (600 mg total) by mouth 2 (two) times daily as needed (neuropathy pain). Patient not taking: Reported on 11/24/2020 08/09/20   Cleora Fleet, MD  Iron Combinations (NIFEREX) TABS Take 1 tablet by mouth daily. 06/24/20   Vassie Loll, MD  Multiple Vitamins-Minerals (MULTIVITAMIN WITH MINERALS) tablet Take 1 tablet by mouth daily.    [provider]  nystatin (MYCOSTATIN/NYSTOP) powder Apply topically 2 (two) times daily. Patient taking differently: Apply 1 application topically 2 (two) times daily as needed (apply to groin area for skin care). 07/06/20   Sherryll Burger, Pratik D, DO  pantoprazole (PROTONIX) 40 MG tablet  Take 1 tablet (40 mg total) by mouth 2 (two) times daily. After 3 months, take daily indefinitely. 08/15/20 11/13/20  Zigmund Daniel., MD  sertraline (ZOLOFT) 25 MG tablet Take 25 mg by mouth daily. 05/04/20   [provider]    Physical Exam: Vitals:   November 29, 2020 2132 11-29-2020 2133 November 29, 2020 2223 11-29-20 2230  BP:  (!) 82/61 (!) 58/47 (!) 61/45  Pulse:      Resp: (!) 24     Temp:      TempSrc:      SpO2: 100%     Weight:      Height:        Physical Exam Vitals and nursing note reviewed.  Constitutional:      Appearance: She is ill-appearing and toxic-appearing.     Comments: Frail female. Appears acutely ill  HENT:     Head: Normocephalic and atraumatic.     Nose: Nose normal.  Eyes:     General: Scleral icterus present.  Cardiovascular:     Rate and Rhythm: Regular rhythm. Tachycardia present.  Pulmonary:     Effort: Respiratory distress present.  Abdominal:     General: Bowel sounds are  absent. There is distension.     Tenderness: There is generalized abdominal tenderness.  Musculoskeletal:     Right lower leg: No edema.     Left lower leg: No edema.  Skin:    General: Skin is warm and dry.     Capillary Refill: Capillary refill takes 2 to 3 seconds.  Neurological:     Mental Status: She is disoriented.     Labs on Admission: I have personally reviewed following labs and imaging studies  CBC: Recent Labs  Lab 2020/11/29 1853  WBC 3.3*  NEUTROABS 2.4  HGB 13.8  HCT 42.8  MCV 88.8  PLT 361   Basic Metabolic Panel: Recent Labs  Lab 11/29/2020 1853  NA 136  K 3.8  CL 103  CO2 25  GLUCOSE 122*  BUN 66*  CREATININE 2.10*  CALCIUM 7.5*   GFR: Estimated Creatinine Clearance: 18.7 mL/min (A) (by C-G formula based on SCr of 2.1 mg/dL (H)). Liver Function Tests: Recent Labs  Lab Nov 29, 2020 1853  AST 32  ALT 29  ALKPHOS 96  BILITOT 0.7  PROT 5.4*  ALBUMIN 1.7*   No results for input(s): LIPASE, AMYLASE in the last 168 hours. No results for input(s): AMMONIA in the last 168 hours. Coagulation Profile: Recent Labs  Lab Nov 29, 2020 1853  INR 1.1   Cardiac Enzymes: No results for input(s): CKTOTAL, CKMB, CKMBINDEX, TROPONINI in the last 168 hours. BNP (last 3 results) No results for input(s): PROBNP in the last 8760 hours. HbA1C: No results for input(s): HGBA1C in the last 72 hours. CBG: Recent Labs  Lab 11-29-2020 1843  GLUCAP 93   Lipid Profile: No results for input(s): CHOL, HDL, LDLCALC, TRIG, CHOLHDL, LDLDIRECT in the last 72 hours. Thyroid Function Tests: No results for input(s): TSH, T4TOTAL, FREET4, T3FREE, THYROIDAB in the last 72 hours. Anemia Panel: No results for input(s): VITAMINB12, FOLATE, FERRITIN, TIBC, IRON, RETICCTPCT in the last 72 hours. Urine analysis:    Component Value Date/Time   COLORURINE YELLOW 06/30/2020 0129   APPEARANCEUR HAZY (A) 06/30/2020 0129   LABSPEC 1.013 06/30/2020 0129   PHURINE 5.0 06/30/2020 0129    GLUCOSEU NEGATIVE 06/30/2020 0129   HGBUR MODERATE (A) 06/30/2020 0129   BILIRUBINUR NEGATIVE 06/30/2020 0129   KETONESUR NEGATIVE 06/30/2020 0129   PROTEINUR NEGATIVE  06/30/2020 0129   NITRITE NEGATIVE 06/30/2020 0129   LEUKOCYTESUR LARGE (A) 06/30/2020 0129    Radiological Exams on Admission: I have personally reviewed images CT ABDOMEN PELVIS WO CONTRAST  Result Date: 11/13/2020 CLINICAL DATA:  Abdominal pain and fever. EXAM: CT ABDOMEN AND PELVIS WITHOUT CONTRAST TECHNIQUE: Multidetector CT imaging of the abdomen and pelvis was performed following the standard protocol without IV contrast. COMPARISON:  CT abdomen pelvis dated 08/17/2020. FINDINGS: Evaluation of this exam is limited due to respiratory motion artifact. Lower chest: Small right pleural effusion. There are bibasilar atelectasis. Advanced three-vessel coronary vascular calcification. Large pneumoperitoneum.  Small ascites. Hepatobiliary: The liver is grossly unremarkable. The gallbladder is distended. No calcified gallstone. Pneumobilia. Pancreas: The pancreas is poorly visualized Spleen: Normal in size without focal abnormality. Adrenals/Urinary Tract: Mild bilateral adrenal thickening/hyperplasia. Mild bilateral renal parenchyma atrophy. There is no hydronephrosis or nephrolithiasis on either side. The urinary bladder is grossly unremarkable. Stomach/Bowel: Postsurgical changes of gastric bypass. No evidence of bowel obstruction. Vascular/Lymphatic: Advanced aortoiliac atherosclerotic disease. The IVC is unremarkable. No adenopathy. Reproductive: Hysterectomy. Other: Fat containing supraumbilical hernia. The neck of the hernia defect measures approximately 16 mm in transverse axial diameter. Musculoskeletal: Osteopenia with degenerative changes of the spine. 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 L5-S1 posterior fusion. There is grade 2 L5-S1 anterolisthesis. T9 compression fracture with complete loss of vertebral body height and  anterior wedging similar to prior CT. No acute osseous pathology. IMPRESSION: 1. Large pneumoperitoneum and small ascites. Findings concerning for bowel perforation or anastomotic dehiscence. Surgical consult is advised. 2. Small right pleural effusion. 3. Aortic Atherosclerosis (ICD10-I70.0). These results were called by telephone at the time of interpretation on 11/20/2020 at 8:17 pm to provider Pricilla Loveless , who verbally acknowledged these results. Electronically Signed   By: Elgie Collard M.D.   On: 11/30/2020 20:19   DG Chest Port 1 View  Result Date: 11/12/2020 CLINICAL DATA:  Short of breath, altered level of consciousness EXAM: PORTABLE CHEST 1 VIEW COMPARISON:  08/17/2020 FINDINGS: Single frontal view of the chest demonstrates an unremarkable cardiac silhouette. No acute airspace disease, effusion, or pneumothorax. There is free gas beneath the right hemidiaphragm, most consistent with perforated viscus. There are no acute bony abnormalities. Chronic degenerative changes of the right shoulder again noted. IMPRESSION: 1. Free gas beneath the right hemidiaphragm, most compatible with perforated viscus. 2. No acute airspace disease. Critical Value/emergent results were called by telephone at the time of interpretation on 12/03/2020 at 7:40 pm to provider Pricilla Loveless , who verbally acknowledged these results. Electronically Signed   By: Sharlet Salina M.D.   On: 11/18/2020 19:43   DG Chest Port 1V same Day  Result Date: 11/12/2020 CLINICAL DATA:  Status post central line placement. EXAM: PORTABLE CHEST 1 VIEW COMPARISON:  Chest radiograph dated 12/09/2020. FINDINGS: Right IJ central venous line with tip in the region of the right atrium close to the right atrium/IVC junction. Recommend retraction by proximally 4 cm. Bilateral linear atelectasis/scarring. No focal consolidation, pleural effusion or pneumothorax. Stable cardiac silhouette. Atherosclerotic calcification of the aorta. No acute  osseous pathology. IMPRESSION: Right IJ central venous line with tip in the region of the right atrium close to the IVC junction. No pneumothorax. Electronically Signed   By: Elgie Collard M.D.   On: 11/17/2020 22:30    EKG: I have personally reviewed EKG: sinus tachycardia   Assessment/Plan Principal Problem:   Perforated abdominal viscus Active Problems:   Septic shock (HCC)   DNR (  do not resuscitate)   Acute respiratory failure with hypoxia (HCC)    Perforated abdominal viscus Admit to ICU bed. Discussed with pt's son-in-law(scott skidmore). Pt's dtr(carmen skidmore) is coming into town tomorrow afternoon. She is a Financial controller.  CVL placed at bedside. Mr. Liam Rogers understands that pt will die soon. Plan is to attempt to keep patient alive until pt's dtr(carmen) can get into town tomorrow so she can say good-bye to the patient. Will initiated IV levophed. IV hydrocortisone given.  Will continue IV Abx for now.  After dtr has had a chance to say good-bye tomorrow, then will initiate comfort care.  Mr. Liam Rogers confirms that pt remains a DNR/DNI.  Family does not want pt to have exploratory surgery. They understand that pt will die from her perforated viscus.  Septic shock (HCC) Septic shock due to perforated viscus.  DNR (do not resuscitate) Confirmed DNR/DNI with pt's son-in-law [employer].  Acute respiratory failure with hypoxia (HCC) Continue with supplemental O2. Family does NOT want pt to be intubated. Pt is a DNR/DNI.  DVT prophylaxis: SCDs Code Status: DNR/DNI(Do NOT Intubate) Family Communication: discussed with pt's son-in-law Scott Skidmore at bedside  Disposition Plan: Pt will likely die in the hospital  Consults called: general surgeyr  Admission status: Inpatient,  ICU   Carollee Herter, DO [facility] 12/05/2020, 10:51 PM   45 min spent in critical care, not including procedures.

## 2020-11-22 NOTE — ED Notes (Signed)
Hospitalist in room.

## 2020-11-22 NOTE — Assessment & Plan Note (Addendum)
Admit to ICU bed. Discussed with pt's son-in-law(scott skidmore). Pt's dtr(carmen skidmore) is coming into town tomorrow afternoon. She is a Financial controller.  CVL placed at bedside. Mr. Liam Rogers understands that pt will die soon. Plan is to attempt to keep patient alive until pt's dtr(carmen) can get into town tomorrow so she can say good-bye to the patient. Will initiated IV levophed. IV hydrocortisone given.  Will continue IV Abx for now.  After dtr has had a chance to say good-bye tomorrow, then will initiate comfort care.  Mr. Liam Rogers confirms that pt remains a DNR/DNI.  Family does not want pt to have exploratory surgery. They understand that pt will die from her perforated viscus.

## 2020-11-22 NOTE — ED Notes (Signed)
Family in room. Edp in room .

## 2020-11-22 NOTE — Subjective & Objective (Signed)
CC: abd pain HPI: 80 year old female with history of congestive heart failure, chronic diastolic heart failure, obesity, emphysema who presents to the hospital today with a 1 day history of abdominal pain.  Patient unable to give any history or review of systems due to her critical nature.  Patient's son Boyd Kerbs is at the bedside.  He states that the patient was in decent health today.  He states the patient did get up and eat breakfast.  Around lunchtime he went to check on her.  He states the patient was still awake.  Around 3:00 he went back to check on her and she was disoriented.  She complained of abdominal pain.  He states that she did not look well.  Patient brought to the ER.  In the ER, patient noted to have lactic acidosis with a lactic acid of 3.6.  White count low was 3.3.  Chest x-ray performed demonstrated free air underneath the right hemidiaphragm compatible with perforated viscus.  CT scan of the abdomen pelvis performed which showed large pneumoperitoneum concerning for bowel perforation.  General surgery was consulted.  Patient seen by Dr. Lovell Sheehan.  Dr. Lovell Sheehan and the family discussed that she would do poorly with surgery.  Family understands that the patient would likely not survive surgery.  Patient is ready a DNR.  Family wanted to keep her comfortable.  The patient's daughter is a flight attendant and will not be back into town until tomorrow.  [facility] hospitalist contacted for admission to the ICU due to the patient's perforated viscus.  I had a conversation with the patient's son-in-law Boyd Kerbs outside the patient's room.  He states that he understands the patient will not survive but wants to try to keep the patient alive as long as possible in order for the daughter to be able to make it back into town so she can say goodbye to the patient.  Son-in-law gave verbal and written consent for central line placement.  Son-in-law understands that patient may die at any  point but we will make every attempt to try to keep the patient alive with using IV Levophed and steroids try to bolster her blood pressure until the daughter can make it back into town.  Son-in-law understands that these are only temporary measures and the patient will die from her overwhelming septic shock but we may be able to keep the patient alive until her daughter can say goodbye.

## 2020-11-22 NOTE — Progress Notes (Signed)
Pharmacy Antibiotic Note  Brenda Hopkins is a 81 y.o. female admitted on 11/17/2020 with sepsis.  Pharmacy has been consulted for Vancomycin and Cefepime dosing.  1st doses given in ED  Plan: Cefepime 2gm IV q12hrs Vancomycin 750mg  IV q24hrs Monitor renal fxn, c/s  Height: 5' (152.4 cm) Weight: 64 kg (141 lb 1.5 oz) IBW/kg (Calculated) : 45.5  Temp (24hrs), Avg:99.4 F (37.4 C), Min:99.4 F (37.4 C), Max:99.4 F (37.4 C)  Recent Labs  Lab 11/24/2020 1853  WBC 3.3*  CREATININE 2.10*  LATICACIDVEN 3.6*    Estimated Creatinine Clearance: 18.7 mL/min (A) (by C-G formula based on SCr of 2.1 mg/dL (H)).    Allergies  Allergen Reactions   Prozac [Fluoxetine Hcl] Other (See Comments)    Makes her fell crazy   Zoloft [Sertraline Hcl]     Made her feel crazy--10/2013   Nicoderm [Nicotine] Rash    Site rash    Antimicrobials this admission: 9/11 Vanc >>  9/11 Cefepime >>  9/11 Flagyl >>  Dose adjustments this admission:   Microbiology results:  BCx:   UCx:    Sputum:    MRSA PCR:   Thank you for allowing pharmacy to be a part of this patient's care.  11/11 A 12/02/2020 7:40 PM

## 2020-11-22 NOTE — ED Triage Notes (Signed)
Pt brought in by [facility] for AMS/ possible stroke. Pt has been declining in health for the past few days. Pt arrives hypotensive on NRB.

## 2020-11-22 NOTE — Consult Note (Signed)
Reason for Consult: Pneumoperitoneum, peritonitis Referring Physician: Dr. Shelia Media Mai Longnecker is an 80 y.o. female.  HPI: Patient is a 80 year old white female who was transferred from a nursing home who was brought by EMS for work-up of possible stroke in declining health.  Work-up included a CT scan of the abdomen due to peritoneal signs and that revealed large pneumoperitoneum, possibly secondary to gastric ulcer ordered gastrojejunal anastomotic ulcer perforation.  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  Patient has a history of congestive heart failure, GERD, hypertension, and renal insufficiency.  History is limited as the patient appears to be in mild respiratory distress.  Past Medical History:  Diagnosis Date   CHF (congestive heart failure) (HCC)    Chronic back pain    GERD (gastroesophageal reflux disease)    Hiatal hernia    Hyperlipidemia    Hypertension    Pelvic fracture (HCC) 12/12/2012   Smoker     Past Surgical History:  Procedure Laterality Date   ABDOMINAL HYSTERECTOMY     APPENDECTOMY     BALLOON DILATION N/A 06/17/2020   Procedure: BALLOON DILATION;  Surgeon: Malissa Hippo, MD;  [facility];  Service: Endoscopy;  Laterality: N/A;   BIOPSY  08/14/2020   Procedure: BIOPSY;  Surgeon: Kathi Der, MD;  Location: [facility];  Service: Gastroenterology;;   ESOPHAGOGASTRODUODENOSCOPY N/A 08/14/2020   Procedure: ESOPHAGOGASTRODUODENOSCOPY (EGD);  Surgeon: Kathi Der, MD;  Location: [facility];  Service: Gastroenterology;  Laterality: N/A;   ESOPHAGOGASTRODUODENOSCOPY (EGD) WITH PROPOFOL N/A 01/17/2020   Procedure: ESOPHAGOGASTRODUODENOSCOPY (EGD) WITH PROPOFOL;  Surgeon: Dolores Frame, MD;  [facility];  Service: Gastroenterology;  Laterality: N/A;    ESOPHAGOGASTRODUODENOSCOPY (EGD) WITH PROPOFOL N/A 06/17/2020   Procedure: ESOPHAGOGASTRODUODENOSCOPY (EGD) WITH PROPOFOL;  Surgeon: Malissa Hippo, MD;  [facility];  Service: Endoscopy;  Laterality: N/A;   GASTRIC BYPASS  03/14/2001   HEMORROIDECTOMY     LUMBAR SPINE SURGERY     OOPHORECTOMY     TONSILLECTOMY      History reviewed. No pertinent family history.  Social History:  reports that she has quit smoking. Her smoking use included cigarettes. She smoked an average of .5 packs per day. She quit smokeless tobacco use about 7 years ago. She reports that she does not drink alcohol and does not use drugs.  Allergies:  Allergies  Allergen Reactions   Prozac [Fluoxetine Hcl] Other (See Comments)    Makes her fell crazy   Zoloft [Sertraline Hcl]     Made her feel crazy--10/2013   Nicoderm [Nicotine] Rash    Site rash    Medications: Prior to Admission: (Not in a hospital admission)   Results for orders placed or performed during the hospital encounter of 11/29/2020 (from the past 48 hour(s))  CBG monitoring, ED     Status: None   Collection Time: 11/24/2020  6:43 PM  Result Value Ref Range   Glucose-Capillary 93 70 - 99 mg/dL    Comment: Glucose reference range applies only to samples taken after fasting for at least 8 hours.  Lactic acid, plasma     Status: Abnormal   Collection Time: 11/21/2020  6:53 PM  Result Value Ref Range   Lactic Acid, Venous 3.6 (HH) 0.5 - 1.9 mmol/L    Comment: CRITICAL RESULT CALLED TO, READ  BACK BY AND VERIFIED WITH: Anthoney HaradaWALKER,T 1920 11-01-20 COLEMAN,R Performed at [facility], [address] [place], [place] [address]   Comprehensive metabolic panel     Status: Abnormal   Collection Time: 11/23/2020  6:53 PM  Result Value Ref Range   Sodium 136 135 - 145 mmol/L   Potassium 3.8 3.5 - 5.1 mmol/L   Chloride 103 98 - 111 mmol/L   CO2 25 22 - 32 mmol/L   Glucose, Bld 122 (H) 70 - 99 mg/dL    Comment: Glucose reference range applies only to  samples taken after fasting for at least 8 hours.   BUN 66 (H) 8 - 23 mg/dL   Creatinine, Ser 6.042.10 (H) 0.44 - 1.00 mg/dL   Calcium 7.5 (L) 8.9 - 10.3 mg/dL   Total Protein 5.4 (L) 6.5 - 8.1 g/dL   Albumin 1.7 (L) 3.5 - 5.0 g/dL   AST 32 15 - 41 U/L   ALT 29 0 - 44 U/L   Alkaline Phosphatase 96 38 - 126 U/L   Total Bilirubin 0.7 0.3 - 1.2 mg/dL   GFR, Estimated 24 (L) >60 mL/min    Comment: (NOTE) Calculated using the CKD-EPI Creatinine Equation (2021)    Anion gap 8 5 - 15    Comment: Performed at [facility], [address] [place], [place] [address]  CBC WITH DIFFERENTIAL     Status: Abnormal   Collection Time: 11/23/2020  6:53 PM  Result Value Ref Range   WBC 3.3 (L) 4.0 - 10.5 K/uL   RBC 4.82 3.87 - 5.11 MIL/uL   Hemoglobin 13.8 12.0 - 15.0 g/dL   HCT 11.942.8 14.736.0 - 82.946.0 %   MCV 88.8 80.0 - 100.0 fL   MCH 28.6 26.0 - 34.0 pg   MCHC 32.2 30.0 - 36.0 g/dL   RDW 56.219.2 (H) 13.011.5 - 86.515.5 %   Platelets 361 150 - 400 K/uL   nRBC 0.0 0.0 - 0.2 %   Neutrophils Relative % 73 %   Neutro Abs 2.4 1.7 - 7.7 K/uL   Lymphocytes Relative 22 %   Lymphs Abs 0.7 0.7 - 4.0 K/uL   Monocytes Relative 4 %   Monocytes Absolute 0.1 0.1 - 1.0 K/uL   Eosinophils Relative 0 %   Eosinophils Absolute 0.0 0.0 - 0.5 K/uL   Basophils Relative 1 %   Basophils Absolute 0.0 0.0 - 0.1 K/uL   Immature Granulocytes 0 %   Abs Immature Granulocytes 0.01 0.00 - 0.07 K/uL    Comment: Performed at [facility], [address] [place], [place] [address]  Protime-INR     Status: None   Collection Time: 11/23/2020  6:53 PM  Result Value Ref Range   Prothrombin Time 13.9 11.4 - 15.2 seconds   INR 1.1 0.8 - 1.2    Comment: (NOTE) INR goal varies based on device and disease states. Performed at [facility], [address] [place], [place] [address]   APTT     Status: None   Collection Time: 11/23/2020  6:53 PM  Result Value Ref Range   aPTT 24 24 - 36 seconds    Comment: Performed at [facility], [address]  [place], [place] [address]  Brain natriuretic peptide     Status: Abnormal   Collection Time: 11/23/2020  6:53 PM  Result Value Ref Range   B Natriuretic Peptide 189.0 (H) 0.0 - 100.0 pg/mL    Comment: Performed at [facility], [address] [place], [place] [address]  Resp Panel by RT-PCR (Flu A&B, Covid) Nasopharyngeal Swab     Status: None   Collection Time: 11/17/2020  6:56 PM   Specimen: Nasopharyngeal Swab; Nasopharyngeal(NP) swabs in vial transport medium  Result Value Ref Range   SARS Coronavirus 2 by RT PCR NEGATIVE NEGATIVE    Comment: (NOTE) SARS-CoV-2 target nucleic acids are NOT DETECTED.  The SARS-CoV-2 RNA is generally detectable in upper respiratory specimens during the acute phase of infection. The lowest concentration of SARS-CoV-2 viral copies this assay can detect is 138 copies/mL. A negative result does not preclude SARS-Cov-2 infection and should not be used as the sole basis for treatment or other patient management decisions. A negative result may occur with  improper specimen collection/handling, submission of specimen other than nasopharyngeal swab, presence of viral mutation(s) within the areas targeted by this assay, and inadequate number of viral copies(<138 copies/mL). A negative result must be combined with clinical observations, patient history, and epidemiological information. The expected result is Negative.  Fact Sheet for Patients:  BloggerCourse.com  Fact Sheet for Healthcare Providers:  SeriousBroker.it  This test is no t yet approved or cleared by the [place] FDA and  has been authorized for detection and/or diagnosis of SARS-CoV-2 by FDA under an Emergency Use Authorization (EUA). This EUA will remain  in effect (meaning this test can be used) for the duration of the COVID-19 declaration under Section 564(b)(1) of the Act, 21 U.S.C.section 360bbb-3(b)(1), unless the authorization is terminated   or revoked sooner.       Influenza A by PCR NEGATIVE NEGATIVE   Influenza B by PCR NEGATIVE NEGATIVE    Comment: (NOTE) The Xpert Xpress SARS-CoV-2/FLU/RSV plus assay is intended as an aid in the diagnosis of influenza from Nasopharyngeal swab specimens and should not be used as a sole basis for treatment. Nasal washings and aspirates are unacceptable for Xpert Xpress SARS-CoV-2/FLU/RSV testing.  Fact Sheet for Patients: BloggerCourse.com  Fact Sheet for Healthcare Providers: SeriousBroker.it  This test is not yet approved or cleared by the [place] FDA and has been authorized for detection and/or diagnosis of SARS-CoV-2 by FDA under an Emergency Use Authorization (EUA). This EUA will remain in effect (meaning this test can be used) for the duration of the COVID-19 declaration under Section 564(b)(1) of the Act, 21 U.S.C. section 360bbb-3(b)(1), unless the authorization is terminated or revoked.  Performed at [facility], [address] [place], [place] [address]   Blood Culture (routine x 2)     Status: None (Preliminary result)   Collection Time: 12/10/2020  7:11 PM   Specimen: Right Antecubital; Blood  Result Value Ref Range   Specimen Description RIGHT ANTECUBITAL    Special Requests      BOTTLES DRAWN AEROBIC ONLY Blood Culture results may not be optimal due to an inadequate volume of blood received in culture bottles Performed at [facility] [address] [place], [place] [address]    Culture PENDING    Report Status PENDING   Blood Culture (routine x 2)     Status: None (Preliminary result)   Collection Time: 11/13/2020  7:18 PM   Specimen: BLOOD LEFT ARM  Result Value Ref Range   Specimen Description BLOOD LEFT ARM    Special Requests      BOTTLES DRAWN AEROBIC ONLY Blood Culture results may not be optimal due to an inadequate volume of blood received in culture bottles Performed at [facility], [address], [place], [place] [address]  Culture PENDING    Report Status PENDING   Blood gas, arterial     Status: Abnormal   Collection Time: 11/20/2020  7:25 PM  Result Value Ref Range   FIO2 100.00    pH, Arterial 7.374 7.350 - 7.450   pCO2 arterial 34.9 32.0 - 48.0 mmHg   pO2, Arterial 194 (H) 83.0 - 108.0 mmHg   Bicarbonate 20.9 20.0 - 28.0 mmol/L   Acid-base deficit 4.5 (H) 0.0 - 2.0 mmol/L   O2 Saturation 98.3 %   Patient temperature 37.0    Allens test (pass/fail) PASS PASS    Comment: Performed at [facility], [address], [place], [place] [address]    CT ABDOMEN PELVIS WO CONTRAST  Result Date: 12/06/2020 CLINICAL DATA:  Abdominal pain and fever. EXAM: CT ABDOMEN AND PELVIS WITHOUT CONTRAST TECHNIQUE: Multidetector CT imaging of the abdomen and pelvis was performed following the standard protocol without IV contrast. COMPARISON:  CT abdomen pelvis dated 08/17/2020. FINDINGS: Evaluation of this exam is limited due to respiratory motion artifact. Lower chest: Small right pleural effusion. There are bibasilar atelectasis. Advanced three-vessel coronary vascular calcification. Large pneumoperitoneum.  Small ascites. Hepatobiliary: The liver is grossly unremarkable. The gallbladder is distended. No calcified gallstone. Pneumobilia. Pancreas: The pancreas is poorly visualized Spleen: Normal in size without focal abnormality. Adrenals/Urinary Tract: Mild bilateral adrenal thickening/hyperplasia. Mild bilateral renal parenchyma atrophy. There is no hydronephrosis or nephrolithiasis on either side. The urinary bladder is grossly unremarkable. Stomach/Bowel: Postsurgical changes of gastric bypass. No evidence of bowel obstruction. Vascular/Lymphatic: Advanced aortoiliac atherosclerotic disease. The IVC is unremarkable. No adenopathy. Reproductive: Hysterectomy. Other: Fat containing supraumbilical hernia. The neck of the hernia defect measures approximately 16 mm in transverse axial diameter.  Musculoskeletal: Osteopenia with degenerative changes of the spine. 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 L5-S1 posterior fusion. There is grade 2 L5-S1 anterolisthesis. T9 compression fracture with complete loss of vertebral body height and anterior wedging similar to prior CT. No acute osseous pathology. IMPRESSION: 1. Large pneumoperitoneum and small ascites. Findings concerning for bowel perforation or anastomotic dehiscence. Surgical consult is advised. 2. Small right pleural effusion. 3. Aortic Atherosclerosis (ICD10-I70.0). These results were called by telephone at the time of interpretation on 12/02/2020 at 8:17 pm to provider Pricilla Loveless , who verbally acknowledged these results. Electronically Signed   By: Elgie Collard M.D.   On: 11/23/2020 20:19   DG Chest Port 1 View  Result Date: 12/01/2020 CLINICAL DATA:  Short of breath, altered level of consciousness EXAM: PORTABLE CHEST 1 VIEW COMPARISON:  08/17/2020 FINDINGS: Single frontal view of the chest demonstrates an unremarkable cardiac silhouette. No acute airspace disease, effusion, or pneumothorax. There is free gas beneath the right hemidiaphragm, most consistent with perforated viscus. There are no acute bony abnormalities. Chronic degenerative changes of the right shoulder again noted. IMPRESSION: 1. Free gas beneath the right hemidiaphragm, most compatible with perforated viscus. 2. No acute airspace disease. Critical Value/emergent results were called by telephone at the time of interpretation on 12/04/2020 at 7:40 pm to provider Pricilla Loveless , who verbally acknowledged these results. Electronically Signed   By: Sharlet Salina M.D.   On: 12/10/2020 19:43    ROS:  Review of systems not obtained due to patient factors.  Blood pressure 105/89, pulse (!) 104, temperature 99.4 F (37.4 C), temperature source Rectal, resp. rate (!) 25, height 5' (1.524 m), weight 64 kg, SpO2 92 %. Physical Exam: White female in moderate  respiratory distress.  She did answer  a few questions appropriately. Head is normocephalic, atraumatic Lung examination reveals labored breathing without wheezing Heart examination reveals a tachycardic rhythm.  No S3 or S4 is noticeable. Abdomen is mildly distended with tenderness throughout.  CT scan images personally reviewed  Assessment/Plan: Impression: Large pneumoperitoneum most likely secondary to an upper GI perforation, possible gastroesophageal ulcer perforation.  Patient has an elevated lactic acid level, appears to be with labored breathing, and with worsening renal failure.  I did asked the patient if she wanted to be intubated after surgery and she said no.  The family states that she would not want to undergo surgery.  I agree that she would do poorly after surgery given her multiple comorbidities, as well as her strong desire not to be on a breathing machine which she would have to be after surgery.  I discussed the situation with the patient's daughter.  She understands this and wants to continue the patient's DNR status as well as no surgical intervention.  She does want to make the patient comfortable, but would want to continue any medications including antibiotics and possible pressors.  She is out of town and is trying to make it back to [place].  I told her that we could not guarantee that she would survive the night.  Patient's prognosis is grave.  Discussed with Dr. Criss Alvine.  Franky Macho 11/16/2020, 9:19 PM

## 2020-11-22 NOTE — ED Notes (Signed)
Patient still on NRB mask , saturation on monitor unreliable.

## 2020-11-22 NOTE — ED Notes (Signed)
CULTURES COLLECTED BEFORE ABX

## 2020-11-22 NOTE — Procedures (Signed)
Procedure: Right IJ central venous line  Indications: Perforated abdominal viscus  Operator: Dr. Carollee Herter  Description: after verbal and written consent was obtained for the patient's son-in-law [address], patient placed in the supine position.  Timeout was called with the bedside nurse.  Patient identified with her arm bracelet with date of birth and name.  Area over the right internal jugular vein was cleaned with 2% chlorhexidine.  Full body sterile drape was used to cover the area over the right IJ.  3 cc of 1% lidocaine were used anesthetize the area just superficial to the right IJ.  Using direct ultrasound guidance, the right IJ was cannulated on the second attempt with a 20-gauge Angiocath.  Guidewire was inserted the Angiocath. Angiocath was removed.  #11 scalpel blade was used to nick the skin.  Tissue dilator was used to dilate the tissue over the guidewire.  Tissue dilator was removed.  Using a previously saline flush triple-lumen central venous catheter, this was placed over the guidewire using a modified Seldinger technique.  Guidewire was controlled at all times.  Guidewire was removed in its entirety.  Catheter was inserted to 18 cm of skin.  All 3 ports were aspirated for dark red nonpulsatile venous blood and then separately flushed with 10 cc normal saline.  Sterile Biopatch was placed.  Catheter was sutured to the skin.  Sterile Tegaderm dressing was placed over the insertion site.  Patient tolerated procedure well without complication or difficulty.  Estimated blood loss less than 2 cc.  Stat portable chest x-ray was used to determine terminal tube placement prior to use.  Carollee Herter, DO

## 2020-11-22 NOTE — ED Provider Notes (Addendum)
[facility] EMERGENCY DEPARTMENT Provider Note   CSN: [id number] Arrival date & time: 11/17/2020  1836  LEVEL 5 CAVEAT - ALTERED MENTAL STATUS   History Chief Complaint  Patient presents with   Altered Mental Status    Brenda Hopkins is a 80 y.o. female.  HPI 80 year old female presents with altered mental status.  EMS called a code stroke.  Reportedly the patient has had a foul smell to her urine and progressively become "deteriorated" over the last couple days.  Last seen "normal" this morning around 9:30 in the morning.  However she has been altered and hallucinating.  EMS was called they found her in a recliner ill-appearing.  They got a blood pressure of 85 one time and most recently 90 systolic.  Otherwise she has been tachycardic and they could not get a good sat and so they put her on a nonrebreather.  They seem to indicate that her left arm seemed weaker than her right.  Past Medical History:  Diagnosis Date   CHF (congestive heart failure) (HCC)    Chronic back pain    GERD (gastroesophageal reflux disease)    Hiatal hernia    Hyperlipidemia    Hypertension    Pelvic fracture (HCC) 12/12/2012   Smoker     Patient Active Problem List   Diagnosis Date Noted   Perforated abdominal viscus 12/07/2020   Coffee ground emesis 08/13/2020   Emphysema, unspecified (HCC) 08/13/2020   HCAP (healthcare-associated pneumonia) 08/07/2020   DNR (do not resuscitate) 08/07/2020   CHF (congestive heart failure) (HCC)    Pneumoperitoneum    Hypokalemia 06/29/2020   Falls 06/29/2020   Physical deconditioning    SIRS (systemic inflammatory response syndrome) (HCC) 06/19/2020   Septic shock (HCC)    Proctitis    Acute metabolic encephalopathy 06/18/2020   Hematochezia 06/18/2020   Abdominal pain    Anemia    Chronic diastolic CHF (congestive heart failure) (HCC) 06/12/2020   [place]---Marginal ulcer 01/30/2020   Noncompliance with medication regimen 01/30/2020   AKI (acute  kidney injury) (HCC) 01/30/2020   Dehydration 01/30/2020   Hypoalbuminemia 01/30/2020   Obesity (BMI 30.0-34.9) 01/30/2020   Nausea 01/30/2020   Acute respiratory failure with hypoxia (HCC) 01/30/2020   Epigastric pain 01/16/2020   Ventral hernia without obstruction or gangrene    Intractable epigastric abdominal pain 01/15/2020   Insomnia 05/13/2014   Generalized anxiety disorder 07/15/2013   Hypertension    Chronic back pain    Hyperlipidemia    GERD (gastroesophageal reflux disease)    Hiatal hernia    Smoker    Pelvic fracture (HCC) 12/12/2012    Past Surgical History:  Procedure Laterality Date   ABDOMINAL HYSTERECTOMY     APPENDECTOMY     BALLOON DILATION N/A 06/17/2020   Procedure: BALLOON DILATION;  Surgeon: Malissa Hippo, MD;  Location: [facility];  Service: Endoscopy;  Laterality: N/A;   BIOPSY  08/14/2020   Procedure: BIOPSY;  Surgeon: Kathi Der, MD;  Location: [facility] ENDOSCOPY;  Service: Gastroenterology;;   ESOPHAGOGASTRODUODENOSCOPY N/A 08/14/2020   Procedure: ESOPHAGOGASTRODUODENOSCOPY (EGD);  Surgeon: Kathi Der, MD;  Location: [facility];  Service: Gastroenterology;  Laterality: N/A;   ESOPHAGOGASTRODUODENOSCOPY (EGD) WITH PROPOFOL N/A 01/17/2020   Procedure: ESOPHAGOGASTRODUODENOSCOPY (EGD) WITH PROPOFOL;  Surgeon: Dolores Frame, MD;  Location: [facility];  Service: Gastroenterology;  Laterality: N/A;   ESOPHAGOGASTRODUODENOSCOPY (EGD) WITH PROPOFOL N/A 06/17/2020   Procedure: ESOPHAGOGASTRODUODENOSCOPY (EGD) WITH PROPOFOL;  Surgeon: Malissa Hippo, MD;  Location:  [facility];  Service: Endoscopy;  Laterality: N/A;   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     History reviewed. No pertinent family history.  Social History   Tobacco Use   Smoking status: Former    Packs/day: 0.50    Types: Cigarettes    Smokeless tobacco: Former    Quit date: 12/28/2012  Vaping Use   Vaping Use: Every day  Substance Use Topics   Alcohol use: No   Drug use: No    Home Medications Prior to Admission medications   Medication Sig Start Date End Date Taking? Authorizing Provider  amLODipine (NORVASC) 5 MG tablet Take 5 mg by mouth daily. 11/11/20  Yes [provider]  atorvastatin (LIPITOR) 20 MG tablet TAKE 1 TABLET BY MOUTH EVERY NIGHT AT BEDTIME Patient taking differently: Take 20 mg by mouth daily. 07/11/16  Yes Allayne Butcher B, PA-C  furosemide (LASIX) 20 MG tablet Take 1 tablet (20 mg total) by mouth every other day. 08/12/20  Yes Johnson, Clanford L, MD  ipratropium-albuterol (DUONEB) 0.5-2.5 (3) MG/3ML SOLN Take 3 mLs by nebulization 3 (three) times daily. 08/09/20  Yes Johnson, Clanford L, MD  metoprolol tartrate (LOPRESSOR) 25 MG tablet Take 1 tablet (25 mg total) by mouth 2 (two) times daily. 06/24/20  Yes Vassie Loll, MD  omeprazole (PRILOSEC) 20 MG capsule Take 20 mg by mouth 2 (two) times daily. 10/24/20  Yes [provider]  oxyCODONE (ROXICODONE) 15 MG immediate release tablet Take 15 mg by mouth 4 (four) times daily as needed. 11/19/20  Yes [provider]  potassium chloride SA (KLOR-CON) 10 MEQ tablet Take 1 tablet (10 mEq total) by mouth every other day. 08/12/20  Yes Johnson, Clanford L, MD  QUEtiapine (SEROQUEL) 200 MG tablet Take 200 mg by mouth at bedtime. 10/22/19  Yes [provider]  rOPINIRole (REQUIP) 3 MG tablet Take 3 mg by mouth at bedtime. 10/23/19  Yes [provider]  gabapentin (NEURONTIN) 600 MG tablet Take 1 tablet (600 mg total) by mouth 2 (two) times daily as needed (neuropathy pain). Patient not taking: Reported on 11/29/2020 08/09/20   Cleora Fleet, MD  Iron Combinations (NIFEREX) TABS Take 1 tablet by mouth daily. 06/24/20   Vassie Loll, MD  Multiple Vitamins-Minerals (MULTIVITAMIN WITH MINERALS) tablet Take 1 tablet by mouth  daily.    [provider]  nystatin (MYCOSTATIN/NYSTOP) powder Apply topically 2 (two) times daily. Patient taking differently: Apply 1 application topically 2 (two) times daily as needed (apply to groin area for skin care). 07/06/20   Sherryll Burger, Pratik D, DO  pantoprazole (PROTONIX) 40 MG tablet Take 1 tablet (40 mg total) by mouth 2 (two) times daily. After 3 months, take daily indefinitely. 08/15/20 11/13/20  Zigmund Daniel., MD  sertraline (ZOLOFT) 25 MG tablet Take 25 mg by mouth daily. 05/04/20   [provider]    Allergies    Prozac [fluoxetine hcl], Zoloft [sertraline hcl], and Nicoderm [nicotine]  Review of Systems   Review of Systems  Unable to perform ROS: Mental status change   Physical Exam Updated Vital Signs BP (!) 93/56   Pulse (!) 108   Temp 98.3 F (36.8 C) (Axillary)   Resp (!) 23   Ht 5' (1.524 m)   Wt 61.7 kg  SpO2 100%   BMI 26.57 kg/m   Physical Exam Vitals and nursing note reviewed.  Constitutional:      General: She is in acute distress.     Appearance: She is well-developed. She is ill-appearing.  HENT:     Head: Normocephalic and atraumatic.     Right Ear: External ear normal.     Left Ear: External ear normal.     Nose: Nose normal.  Eyes:     General:        Right eye: No discharge.        Left eye: No discharge.  Cardiovascular:     Rate and Rhythm: Regular rhythm. Tachycardia present.     Heart sounds: Normal heart sounds.  Pulmonary:     Effort: Tachypnea and accessory muscle usage present.     Breath sounds: Normal breath sounds.  Abdominal:     General: There is distension.     Tenderness: There is abdominal tenderness.     Hernia: A hernia is present.  Musculoskeletal:     Right lower leg: Edema present.     Left lower leg: Edema present.  Skin:    General: Skin is dry.     Comments: Patient's legs are mildly cool  Neurological:     Mental Status: She is alert.     Comments: Patient is alert and awake and  sometimes responds to her name and certainly to painful stimuli but does not really follow commands.  Psychiatric:        Mood and Affect: Mood is not anxious.    ED Results / Procedures / Treatments   Labs (all labs ordered are listed, but only abnormal results are displayed) Labs Reviewed  LACTIC ACID, PLASMA - Abnormal; Notable for the following components:      Result Value   Lactic Acid, Venous 3.6 (*)    All other components within normal limits  LACTIC ACID, PLASMA - Abnormal; Notable for the following components:   Lactic Acid, Venous 3.3 (*)    All other components within normal limits  COMPREHENSIVE METABOLIC PANEL - Abnormal; Notable for the following components:   Glucose, Bld 122 (*)    BUN 66 (*)    Creatinine, Ser 2.10 (*)    Calcium 7.5 (*)    Total Protein 5.4 (*)    Albumin 1.7 (*)    GFR, Estimated 24 (*)    All other components within normal limits  CBC WITH DIFFERENTIAL/PLATELET - Abnormal; Notable for the following components:   WBC 3.3 (*)    RDW 19.2 (*)    All other components within normal limits  BRAIN NATRIURETIC PEPTIDE - Abnormal; Notable for the following components:   B Natriuretic Peptide 189.0 (*)    All other components within normal limits  BLOOD GAS, ARTERIAL - Abnormal; Notable for the following components:   pO2, Arterial 194 (*)    Acid-base deficit 4.5 (*)    All other components within normal limits  RESP PANEL BY RT-PCR (FLU A&B, COVID) ARPGX2  CULTURE, BLOOD (ROUTINE X 2)  CULTURE, BLOOD (ROUTINE X 2)  URINE CULTURE  MRSA NEXT GEN BY PCR, NASAL  PROTIME-INR  APTT  URINALYSIS, ROUTINE W REFLEX MICROSCOPIC  CBG MONITORING, ED    EKG EKG Interpretation  Date/Time:  Sunday November 22 2020 20:22:38 EDT Ventricular Rate:  101 PR Interval:  140 QRS Duration: 82 QT Interval:  341 QTC Calculation: 442 R Axis:   -59 Text Interpretation: Sinus tachycardia Atrial  premature complex Inferior infarct, old Anterior infarct, old  When compared with ECG of 08/17/2020, HEART RATE has increased Confirmed by Dione Booze ([id number]) on 12/10/2020 11:51:16 PM  Radiology CT ABDOMEN PELVIS WO CONTRAST  Result Date: 11/21/2020 CLINICAL DATA:  Abdominal pain and fever. EXAM: CT ABDOMEN AND PELVIS WITHOUT CONTRAST TECHNIQUE: Multidetector CT imaging of the abdomen and pelvis was performed following the standard protocol without IV contrast. COMPARISON:  CT abdomen pelvis dated 08/17/2020. FINDINGS: Evaluation of this exam is limited due to respiratory motion artifact. Lower chest: Small right pleural effusion. There are bibasilar atelectasis. Advanced three-vessel coronary vascular calcification. Large pneumoperitoneum.  Small ascites. Hepatobiliary: The liver is grossly unremarkable. The gallbladder is distended. No calcified gallstone. Pneumobilia. Pancreas: The pancreas is poorly visualized Spleen: Normal in size without focal abnormality. Adrenals/Urinary Tract: Mild bilateral adrenal thickening/hyperplasia. Mild bilateral renal parenchyma atrophy. There is no hydronephrosis or nephrolithiasis on either side. The urinary bladder is grossly unremarkable. Stomach/Bowel: Postsurgical changes of gastric bypass. No evidence of bowel obstruction. Vascular/Lymphatic: Advanced aortoiliac atherosclerotic disease. The IVC is unremarkable. No adenopathy. Reproductive: Hysterectomy. Other: Fat containing supraumbilical hernia. The neck of the hernia defect measures approximately 16 mm in transverse axial diameter. Musculoskeletal: Osteopenia with degenerative changes of the spine. 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 L5-S1 posterior fusion. There is grade 2 L5-S1 anterolisthesis. T9 compression fracture with complete loss of vertebral body height and anterior wedging similar to prior CT. No acute osseous pathology. IMPRESSION: 1. Large pneumoperitoneum and small ascites. Findings concerning for bowel perforation or anastomotic dehiscence. Surgical  consult is advised. 2. Small right pleural effusion. 3. Aortic Atherosclerosis (ICD10-I70.0). These results were called by telephone at the time of interpretation on 11/26/2020 at 8:17 pm to provider Pricilla Loveless , who verbally acknowledged these results. Electronically Signed   By: Elgie Collard M.D.   On: 11/13/2020 20:19   DG Chest Port 1 View  Result Date: 11/16/2020 CLINICAL DATA:  Short of breath, altered level of consciousness EXAM: PORTABLE CHEST 1 VIEW COMPARISON:  08/17/2020 FINDINGS: Single frontal view of the chest demonstrates an unremarkable cardiac silhouette. No acute airspace disease, effusion, or pneumothorax. There is free gas beneath the right hemidiaphragm, most consistent with perforated viscus. There are no acute bony abnormalities. Chronic degenerative changes of the right shoulder again noted. IMPRESSION: 1. Free gas beneath the right hemidiaphragm, most compatible with perforated viscus. 2. No acute airspace disease. Critical Value/emergent results were called by telephone at the time of interpretation on 11/18/2020 at 7:40 pm to provider Pricilla Loveless , who verbally acknowledged these results. Electronically Signed   By: Sharlet Salina M.D.   On: 12/11/2020 19:43   DG Chest Port 1V same Day  Result Date: 11/29/2020 CLINICAL DATA:  Status post central line placement. EXAM: PORTABLE CHEST 1 VIEW COMPARISON:  Chest radiograph dated 12/04/2020. FINDINGS: Right IJ central venous line with tip in the region of the right atrium close to the right atrium/IVC junction. Recommend retraction by proximally 4 cm. Bilateral linear atelectasis/scarring. No focal consolidation, pleural effusion or pneumothorax. Stable cardiac silhouette. Atherosclerotic calcification of the aorta. No acute osseous pathology. IMPRESSION: Right IJ central venous line with tip in the region of the right atrium close to the IVC junction. No pneumothorax. Electronically Signed   By: Elgie Collard M.D.   On:  11/18/2020 22:30    Procedures .Critical Care Performed by: Pricilla Loveless, MD Authorized by: Pricilla Loveless, MD   Critical care provider statement:    Critical care time (  minutes):  45   Critical care time was exclusive of:  Separately billable procedures and treating other patients   Critical care was necessary to treat or prevent imminent or life-threatening deterioration of the following conditions:  Sepsis and renal failure   Critical care was time spent personally by me on the following activities:  Discussions with consultants, evaluation of patient's response to treatment, examination of patient, ordering and performing treatments and interventions, ordering and review of laboratory studies, ordering and review of radiographic studies, pulse oximetry, re-evaluation of patient's condition, obtaining history from patient or surrogate and review of old charts Ultrasound ED Peripheral IV (Provider)  Date/Time: 11/23/2020 4:04 PM Performed by: Pricilla LovelessGoldston, Amandalynn Pitz, MD Authorized by: Pricilla LovelessGoldston, Shizue Kaseman, MD   Procedure details:    Indications: multiple failed IV attempts and poor IV access     Skin Prep: isopropyl alcohol     Location:  Right AC   Angiocath:  20 G   Bedside Ultrasound Guided: Yes     Patient tolerated procedure without complications: Yes     Dressing applied: Yes     Medications Ordered in ED Medications  norepinephrine (LEVOPHED) 4mg  in 250mL premix infusion (23 mcg/min Intravenous Rate/Dose Change 12/06/2020 2325)  0.9 %  sodium chloride infusion ( Intravenous New Bag/Given 11/26/2020 2242)  piperacillin-tazobactam (ZOSYN) IVPB 2.25 g (has no administration in time range)  morphine 2 MG/ML injection 2 mg (has no administration in time range)    Or  morphine 4 MG/ML injection 4 mg (has no administration in time range)  ondansetron (ZOFRAN) injection 4 mg (has no administration in time range)  lactated ringers bolus 1,000 mL (0 mLs Intravenous Stopped 11/18/2020 2142)  ceFEPIme  (MAXIPIME) 2 g in sodium chloride 0.9 % 100 mL IVPB (0 g Intravenous Stopped 11/21/2020 2007)  metroNIDAZOLE (FLAGYL) IVPB 500 mg (0 mg Intravenous Stopped 11/16/2020 2035)  vancomycin (VANCOCIN) IVPB 1000 mg/200 mL premix (0 mg Intravenous Stopped 11/27/2020 2142)  lactated ringers bolus 1,000 mL (0 mLs Intravenous Stopped 12/07/2020 2251)  pantoprazole (PROTONIX) injection 40 mg (40 mg Intravenous Given 12/01/2020 2038)  hydrocortisone sodium succinate (SOLU-CORTEF) injection 150 mg (150 mg Intravenous Given 11/27/2020 2306)    ED Course  I have reviewed the triage vital signs and the nursing notes.  Pertinent labs & imaging results that were available during my care of the patient were reviewed by me and considered in my medical decision making (see chart for details).    MDM Rules/Calculators/A&P                           Patient is found to have pneumoperitoneum. Explains her shock state. She is ill appearing. Discussed with Daughter, she is DNR. After discussion with Dr. Lovell SheehanJenkins, who has evaluated patient and talked with family, she is not wanting surgery either. However, still wants meds, antibiotics, fluids and even pressors if needed. Dr. Imogene Burnhen was consulted and will admit. She was given broad antibiotics, fluids.  Final Clinical Impression(s) / ED Diagnoses Final diagnoses:  Perforated abdominal viscus  Hypovolemic shock (HCC)  Acute kidney injury [facility](HCC)    Rx / DC Orders ED Discharge Orders     None        Pricilla LovelessGoldston, Somaya Grassi, MD 11/23/20 Ozella [id number]    Jearldean Gutt, MD 11/23/20 [phone]

## 2020-11-22 NOTE — Assessment & Plan Note (Signed)
Confirmed DNR/DNI with pt's son-in-law Boyd Kerbs.

## 2020-11-22 NOTE — ED Notes (Signed)
Pt son-in-law in room stating him and daughter request patient to be a DNR

## 2020-11-22 NOTE — Assessment & Plan Note (Signed)
Continue with supplemental O2. Family does NOT want pt to be intubated. Pt is a DNR/DNI.

## 2020-11-22 NOTE — Assessment & Plan Note (Signed)
Septic shock due to perforated viscus.

## 2020-11-23 ENCOUNTER — Encounter (HOSPITAL_COMMUNITY): Payer: Self-pay | Admitting: Internal Medicine

## 2020-11-23 DIAGNOSIS — N179 Acute kidney failure, unspecified: Secondary | ICD-10-CM

## 2020-11-23 DIAGNOSIS — A419 Sepsis, unspecified organism: Secondary | ICD-10-CM

## 2020-11-23 DIAGNOSIS — R6521 Severe sepsis with septic shock: Secondary | ICD-10-CM

## 2020-11-23 DIAGNOSIS — G9341 Metabolic encephalopathy: Secondary | ICD-10-CM

## 2020-11-23 DIAGNOSIS — J9601 Acute respiratory failure with hypoxia: Secondary | ICD-10-CM | POA: Diagnosis not present

## 2020-11-23 DIAGNOSIS — J439 Emphysema, unspecified: Secondary | ICD-10-CM

## 2020-11-23 DIAGNOSIS — R198 Other specified symptoms and signs involving the digestive system and abdomen: Secondary | ICD-10-CM | POA: Diagnosis not present

## 2020-11-23 DIAGNOSIS — I5032 Chronic diastolic (congestive) heart failure: Secondary | ICD-10-CM

## 2020-11-23 LAB — BLOOD CULTURE ID PANEL (REFLEXED) - BCID2

## 2020-11-23 LAB — MRSA NEXT GEN BY PCR, NASAL: MRSA by PCR Next Gen: DETECTED — AB

## 2020-11-23 MED ORDER — PIPERACILLIN-TAZOBACTAM 3.375 G IVPB
3.3750 g | Freq: Two times a day (BID) | INTRAVENOUS | Status: DC
  Administered 2020-11-23 (×2): 3.375 g via INTRAVENOUS
  Filled 2020-11-23 (×2): qty 50

## 2020-11-23 MED ORDER — MORPHINE SULFATE (PF) 2 MG/ML IV SOLN
2.0000 mg | Freq: Once | INTRAVENOUS | Status: AC
Start: 2020-11-23 — End: 2020-11-23
  Administered 2020-11-23: 2 mg via INTRAVENOUS
  Filled 2020-11-23: qty 1

## 2020-11-23 MED ORDER — MORPHINE SULFATE (PF) 2 MG/ML IV SOLN
2.0000 mg | INTRAVENOUS | Status: DC | PRN
  Administered 2020-11-23 – 2020-11-24 (×8): 2 mg via INTRAVENOUS
  Filled 2020-11-23 (×8): qty 1

## 2020-11-23 MED ORDER — MORPHINE SULFATE (PF) 4 MG/ML IV SOLN
4.0000 mg | INTRAVENOUS | Status: DC | PRN
  Administered 2020-11-24: 4 mg via INTRAVENOUS
  Filled 2020-11-23: qty 1

## 2020-11-23 MED ORDER — PIPERACILLIN-TAZOBACTAM IN DEX 2-0.25 GM/50ML IV SOLN
2.2500 g | Freq: Four times a day (QID) | INTRAVENOUS | Status: DC
  Administered 2020-11-23: 2.25 g via INTRAVENOUS
  Filled 2020-11-23 (×4): qty 50

## 2020-11-23 MED ORDER — ONDANSETRON HCL 4 MG/2ML IJ SOLN: 4.0000 mg | Freq: Four times a day (QID) | INTRAMUSCULAR | Status: DC | PRN

## 2020-11-23 NOTE — Progress Notes (Signed)
Nutrition Brief Note  Chart reviewed. Patient screened for Malnutrition Screening Tool (MST).  Pt now transitioning to comfort care. In hospital death is expected.  If able, allow patient to eat for comfort.  Please consult if needed.   Vertell Limber, RD, LDN (she/her/hers) Registered Dietitian I After-Hours/Weekend Pager # in [place]

## 2020-11-23 NOTE — Progress Notes (Signed)
PHARMACY - PHYSICIAN COMMUNICATION CRITICAL VALUE ALERT - BLOOD CULTURE IDENTIFICATION (BCID)  Brenda Hopkins is an 80 y.o. female who presented to [facility] on [facility]/27/2020 with a chief complaint of abdominal pain  Assessment:  Total care discussion was held with the patient's family and focus of care is being transitioned to full comfort once the patient's daughter is able to arrive to see the patient -Patient's family understand her grave prognosis. Bcx is likely contaminant.  Name of physician (or Provider) Contacted: Dr. Arbutus Leas  Current antibiotics: zosyn  Changes to prescribed antibiotics recommended:  No further abx change  Results for orders placed or performed during the hospital encounter of 11/15/2020  Blood Culture ID Panel (Reflexed) (Collected: 12/09/2020  7:11 PM)  Result Value Ref Range   Enterococcus faecalis NOT DETECTED NOT DETECTED   Enterococcus Faecium NOT DETECTED NOT DETECTED   Listeria monocytogenes NOT DETECTED NOT DETECTED   Staphylococcus species DETECTED (A) NOT DETECTED   Staphylococcus aureus (BCID) NOT DETECTED NOT DETECTED   Staphylococcus epidermidis DETECTED (A) NOT DETECTED   Staphylococcus lugdunensis NOT DETECTED NOT DETECTED   Streptococcus species NOT DETECTED NOT DETECTED   Streptococcus agalactiae NOT DETECTED NOT DETECTED   Streptococcus pneumoniae NOT DETECTED NOT DETECTED   Streptococcus pyogenes NOT DETECTED NOT DETECTED   A.calcoaceticus-baumannii NOT DETECTED NOT DETECTED   Bacteroides fragilis NOT DETECTED NOT DETECTED   Enterobacterales NOT DETECTED NOT DETECTED   Enterobacter cloacae complex NOT DETECTED NOT DETECTED   Escherichia coli NOT DETECTED NOT DETECTED   Klebsiella aerogenes NOT DETECTED NOT DETECTED   Klebsiella oxytoca NOT DETECTED NOT DETECTED   Klebsiella pneumoniae NOT DETECTED NOT DETECTED   Proteus species NOT DETECTED NOT DETECTED   Salmonella species NOT DETECTED NOT DETECTED   Serratia marcescens NOT DETECTED  NOT DETECTED   Haemophilus influenzae NOT DETECTED NOT DETECTED   Neisseria meningitidis NOT DETECTED NOT DETECTED   Pseudomonas aeruginosa NOT DETECTED NOT DETECTED   Stenotrophomonas maltophilia NOT DETECTED NOT DETECTED   Candida albicans NOT DETECTED NOT DETECTED   Candida auris NOT DETECTED NOT DETECTED   Candida glabrata NOT DETECTED NOT DETECTED   Candida krusei NOT DETECTED NOT DETECTED   Candida parapsilosis NOT DETECTED NOT DETECTED   Candida tropicalis NOT DETECTED NOT DETECTED   Cryptococcus neoformans/gattii NOT DETECTED NOT DETECTED   Methicillin resistance mecA/C DETECTED (A) NOT DETECTED    Tera Mater 11/23/2020  4:19 PM

## 2020-11-23 NOTE — Progress Notes (Addendum)
PROGRESS NOTE  Brenda Hopkins QQV:[id number] DOB: 04/20/1940 DOA: 06-14-2020 PCP: Shawnie DapperMann, Benjamin L, PA-C  Brief History:  80 year old female with a history of diastolic CHF, hypertension, hyperlipidemia, COPD, tobacco abuse, Roux-en-Y marginal ulcer, anxiety presenting with 1 day history of abdominal pain.  Around 3 PM on 06-14-2020, patient was noted to be confused and disoriented with hallucinations.  EMS was activated and patient was noted to have a systolic blood pressure of 85.  She was noted to be hypoxic when placed on nonrebreather.  In the emergency department, patient was noted to be hypotensive with temperature 99.4 F.  BMP showed sodium 136, potassium 3.8, serum creatinine 2.10.  LFTs were unremarkable.  WBC 3.3, hemoglobin 13.8, platelets 261,000.  CT of the abdomen and pelvis showed a large pneumoperitoneum with ascites.  General surgery was consulted.  However, extensive goals of care discussion was held with the patient's family.  Given the patient's significant comorbidities and current acute medical state, she was felt to have significant morbidity and mortality to undergo surgical intervention.  As result, patient's family opted for nonoperative management.  Subsequently, they decided to transition the patient's focus of care to full comfort.  However, medical therapies were continued including IV fluids, antibiotics, steroids until the remainder of the patient's family was able to arrive to see the patient.  Assessment/Plan: Septic shock -Patient presented with hypotension refractory to IV fluid resuscitation, WBC 3.3, tachycardia -Patient was started on Levophed, Zosyn, IV fluids, and IV steroids -Total care discussion was held with the patient's family and focus of care is being transitioned to full comfort once the patient's daughter is able to arrive to see the patient -Patient's family understand her grave prognosis  Pneumoperitoneum/perforated  viscus -General surgery was consulted -She was felt to be a high risk surgical candidate and unlikely to survive surgery -After goals of care discussion, patient will be transition to full comfort  AKI -Baseline creatinine 0.5-0.8 -Secondary to sepsis -Presented with serum creatinine 0.5-3.8  Acute Respiratory Failure with hypoxia -she is DNR/DNI -she has COPD and tobacco abuse -personally reviewed CXR-->+pneumoperitoneum  Acute metabolic Encephalopathy -due to sepsis  Upper GI bleed history -Patient was admitted in June 2022 secondary to hematemesis -She underwent EGD which showed gastritis and an anastomotic ulcer  Hyperlipidemia -Patient has been transitioned to full comfort  Essential hypertension -Antihypertensives were held secondary to sepsis and hypotension  Chronic diastolic CHF -Patient has been transitioned to full comfort care -Not active issue presently  Tobacco abuse -Patient has been transitioned to full comfort care      Status is: Inpatient  Remains inpatient appropriate because:Hemodynamically unstable  Dispo: The patient is from: Home              Anticipated d/c is to: In hospital death              Patient currently is not medically stable to d/c.   Difficult to place patient No        Family Communication:   no Family at bedside  Consultants:  General Surgery  Code Status:  DNR  DVT Prophylaxis:  FULL COMFORT   Procedures: As Listed in Progress Note Above  Antibiotics: Zosyn 9/12>>   The patient is critically ill with multiple organ systems failure and requires high complexity decision making for assessment and support, frequent evaluation and titration of therapies, application of advanced monitoring technologies and extensive interpretation of multiple databases.  Critical care  time - 35 mins.    Subjective: Patient awakens to voice, but mostly moans.  ROS unobtainable  Objective: Vitals:   11/23/20 0615 11/23/20  0630 11/23/20 0645 11/23/20 0700  BP: (!) 100/51 (!) 83/45 (!) 102/55   Pulse: 89 (!) 52 91   Resp: 15 (!) 27 15   Temp:    99.3 F (37.4 C)  TempSrc:    Axillary  SpO2: 100% 100% 100%   Weight:      Height:        Intake/Output Summary (Last 24 hours) at 11/23/2020 0750 Last data filed at 11/23/2020 0107 Gross per 24 hour  Intake 1634.17 ml  Output --  Net 1634.17 ml   Weight change:  Exam:  General:  Pt is alert, does not follows commands appropriately, not in acute distress HEENT: No icterus, No thrush, No neck mass, [place]/AT Cardiovascular: RRR, S1/S2, no rubs, no gallops Respiratory: bibasilar rales. No wheeze Abdomen: Soft/+BS, non tender, non distended, no guarding Extremities: No edema, No lymphangitis, No petechiae, No rashes, no synovitis   Data Reviewed: I have personally reviewed following labs and imaging studies Basic Metabolic Panel: Recent Labs  Lab 12/11/2020 1853  NA 136  K 3.8  CL 103  CO2 25  GLUCOSE 122*  BUN 66*  CREATININE 2.10*  CALCIUM 7.5*   Liver Function Tests: Recent Labs  Lab 11/24/2020 1853  AST 32  ALT 29  ALKPHOS 96  BILITOT 0.7  PROT 5.4*  ALBUMIN 1.7*   No results for input(s): LIPASE, AMYLASE in the last 168 hours. No results for input(s): AMMONIA in the last 168 hours. Coagulation Profile: Recent Labs  Lab 11/30/2020 1853  INR 1.1   CBC: Recent Labs  Lab 11/23/2020 1853  WBC 3.3*  NEUTROABS 2.4  HGB 13.8  HCT 42.8  MCV 88.8  PLT 361   Cardiac Enzymes: No results for input(s): CKTOTAL, CKMB, CKMBINDEX, TROPONINI in the last 168 hours. BNP: Invalid input(s): POCBNP CBG: Recent Labs  Lab 11/28/2020 1843  GLUCAP 93   HbA1C: No results for input(s): HGBA1C in the last 72 hours. Urine analysis:    Component Value Date/Time   COLORURINE YELLOW 06/30/2020 0129   APPEARANCEUR HAZY (A) 06/30/2020 0129   LABSPEC 1.013 06/30/2020 0129   PHURINE 5.0 06/30/2020 0129   GLUCOSEU NEGATIVE 06/30/2020 0129   HGBUR  MODERATE (A) 06/30/2020 0129   BILIRUBINUR NEGATIVE 06/30/2020 0129   KETONESUR NEGATIVE 06/30/2020 0129   PROTEINUR NEGATIVE 06/30/2020 0129   NITRITE NEGATIVE 06/30/2020 0129   LEUKOCYTESUR LARGE (A) 06/30/2020 0129   Sepsis Labs: @LABRCNTIP (procalcitonin:4,lacticidven:4) ) Recent Results (from the past 240 hour(s))  Resp Panel by RT-PCR (Flu A&B, Covid) Nasopharyngeal Swab     Status: None   Collection Time: 12/08/2020  6:56 PM   Specimen: Nasopharyngeal Swab; Nasopharyngeal(NP) swabs in vial transport medium  Result Value Ref Range Status   SARS Coronavirus 2 by RT PCR NEGATIVE NEGATIVE Final    Comment: (NOTE) SARS-CoV-2 target nucleic acids are NOT DETECTED.  The SARS-CoV-2 RNA is generally detectable in upper respiratory specimens during the acute phase of infection. The lowest concentration of SARS-CoV-2 viral copies this assay can detect is 138 copies/mL. A negative result does not preclude SARS-Cov-2 infection and should not be used as the sole basis for treatment or other patient management decisions. A negative result may occur with  improper specimen collection/handling, submission of specimen other than nasopharyngeal swab, presence of viral mutation(s) within the areas targeted by this assay, and  inadequate number of viral copies(<138 copies/mL). A negative result must be combined with clinical observations, patient history, and epidemiological information. The expected result is Negative.  Fact Sheet for Patients:  BloggerCourse.com  Fact Sheet for Healthcare Providers:  SeriousBroker.it  This test is no t yet approved or cleared by the [place] FDA and  has been authorized for detection and/or diagnosis of SARS-CoV-2 by FDA under an Emergency Use Authorization (EUA). This EUA will remain  in effect (meaning this test can be used) for the duration of the COVID-19 declaration under Section 564(b)(1) of the  Act, 21 U.S.C.section 360bbb-3(b)(1), unless the authorization is terminated  or revoked sooner.       Influenza A by PCR NEGATIVE NEGATIVE Final   Influenza B by PCR NEGATIVE NEGATIVE Final    Comment: (NOTE) The Xpert Xpress SARS-CoV-2/FLU/RSV plus assay is intended as an aid in the diagnosis of influenza from Nasopharyngeal swab specimens and should not be used as a sole basis for treatment. Nasal washings and aspirates are unacceptable for Xpert Xpress SARS-CoV-2/FLU/RSV testing.  Fact Sheet for Patients: BloggerCourse.com  Fact Sheet for Healthcare Providers: SeriousBroker.it  This test is not yet approved or cleared by the [place] FDA and has been authorized for detection and/or diagnosis of SARS-CoV-2 by FDA under an Emergency Use Authorization (EUA). This EUA will remain in effect (meaning this test can be used) for the duration of the COVID-19 declaration under Section 564(b)(1) of the Act, 21 U.S.C. section 360bbb-3(b)(1), unless the authorization is terminated or revoked.  Performed at [facility], [address] [place], [place] [address]   Blood Culture (routine x 2)     Status: None (Preliminary result)   Collection Time: 11/17/2020  7:11 PM   Specimen: Right Antecubital; Blood  Result Value Ref Range Status   Specimen Description RIGHT ANTECUBITAL  Final   Special Requests   Final    BOTTLES DRAWN AEROBIC ONLY Blood Culture results may not be optimal due to an inadequate volume of blood received in culture bottles Performed at [facility], [address] [place], [place] [address]    Culture PENDING  Incomplete   Report Status PENDING  Incomplete  Blood Culture (routine x 2)     Status: None (Preliminary result)   Collection Time: 11/19/2020  7:18 PM   Specimen: BLOOD LEFT ARM  Result Value Ref Range Status   Specimen Description BLOOD LEFT ARM  Final   Special Requests   Final    BOTTLES DRAWN AEROBIC  ONLY Blood Culture results may not be optimal due to an inadequate volume of blood received in culture bottles Performed at [facility], [address], [place], [place] [address]    Culture PENDING  Incomplete   Report Status PENDING  Incomplete     Scheduled Meds: Continuous Infusions:  sodium chloride 100 mL/hr at 11/30/2020 2242   norepinephrine (LEVOPHED) Adult infusion 28 mcg/min (11/23/20 0629)   piperacillin-tazobactam (ZOSYN)  IV      Procedures/Studies: CT ABDOMEN PELVIS WO CONTRAST  Result Date: 12/02/2020 CLINICAL DATA:  Abdominal pain and fever. EXAM: CT ABDOMEN AND PELVIS WITHOUT CONTRAST TECHNIQUE: Multidetector CT imaging of the abdomen and pelvis was performed following the standard protocol without IV contrast. COMPARISON:  CT abdomen pelvis dated 08/17/2020. FINDINGS: Evaluation of this exam is limited due to respiratory motion artifact. Lower chest: Small right pleural effusion. There are bibasilar atelectasis. Advanced three-vessel coronary vascular calcification. Large pneumoperitoneum.  Small ascites. Hepatobiliary: The liver is grossly unremarkable.  The gallbladder is distended. No calcified gallstone. Pneumobilia. Pancreas: The pancreas is poorly visualized Spleen: Normal in size without focal abnormality. Adrenals/Urinary Tract: Mild bilateral adrenal thickening/hyperplasia. Mild bilateral renal parenchyma atrophy. There is no hydronephrosis or nephrolithiasis on either side. The urinary bladder is grossly unremarkable. Stomach/Bowel: Postsurgical changes of gastric bypass. No evidence of bowel obstruction. Vascular/Lymphatic: Advanced aortoiliac atherosclerotic disease. The IVC is unremarkable. No adenopathy. Reproductive: Hysterectomy. Other: Fat containing supraumbilical hernia. The neck of the hernia defect measures approximately 16 mm in transverse axial diameter. Musculoskeletal: Osteopenia with degenerative changes of the spine. 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 L5-S1 posterior fusion. There is grade 2 L5-S1 anterolisthesis. T9 compression fracture with complete loss of vertebral body height and anterior wedging similar to prior CT. No acute osseous pathology. IMPRESSION: 1. Large pneumoperitoneum and small ascites. Findings concerning for bowel perforation or anastomotic dehiscence. Surgical consult is advised. 2. Small right pleural effusion. 3. Aortic Atherosclerosis (ICD10-I70.0). These results were called by telephone at the time of interpretation on 12/10/2020 at 8:17 pm to provider Pricilla Loveless , who verbally acknowledged these results. Electronically Signed   By: Elgie Collard M.D.   On: 11/27/2020 20:19   DG Chest Port 1 View  Result Date: 11/26/2020 CLINICAL DATA:  Short of breath, altered level of consciousness EXAM: PORTABLE CHEST 1 VIEW COMPARISON:  08/17/2020 FINDINGS: Single frontal view of the chest demonstrates an unremarkable cardiac silhouette. No acute airspace disease, effusion, or pneumothorax. There is free gas beneath the right hemidiaphragm, most consistent with perforated viscus. There are no acute bony abnormalities. Chronic degenerative changes of the right shoulder again noted. IMPRESSION: 1. Free gas beneath the right hemidiaphragm, most compatible with perforated viscus. 2. No acute airspace disease. Critical Value/emergent results were called by telephone at the time of interpretation on 11/14/2020 at 7:40 pm to provider Pricilla Loveless , who verbally acknowledged these results. Electronically Signed   By: Sharlet Salina M.D.   On: 11/29/2020 19:43   DG Chest Port 1V same Day  Result Date: 11/12/2020 CLINICAL DATA:  Status post central line placement. EXAM: PORTABLE CHEST 1 VIEW COMPARISON:  Chest radiograph dated 11/26/2020. FINDINGS: Right IJ central venous line with tip in the region of the right atrium close to the right atrium/IVC junction. Recommend retraction by proximally 4 cm. Bilateral linear  atelectasis/scarring. No focal consolidation, pleural effusion or pneumothorax. Stable cardiac silhouette. Atherosclerotic calcification of the aorta. No acute osseous pathology. IMPRESSION: Right IJ central venous line with tip in the region of the right atrium close to the IVC junction. No pneumothorax. Electronically Signed   By: Elgie Collard M.D.   On: 11/18/2020 22:30    Catarina Hartshorn, DO  [facility]  If 7PM-7AM, please contact night-coverage [email or web address] Password [email or web address] 11/23/2020, 7:50 AM   LOS: 1 day

## 2020-11-23 NOTE — Progress Notes (Signed)
eLink Physician-Brief Progress Note Patient Name: Brenda Hopkins DOB: 11/06/1940 MRN: [id number]   Date of Service  11/23/2020  HPI/Events of Note  Patient admitted with free air under the diaphragm, consistent with a perforated viscus, acute abdomen, and intra-abdominal sepsis, family declines surgery and requests medical management until daughter can get here tomorrow, at which point plan is to transition to full comfort measures, patient is DNI /DNR.  eICU Interventions  New Patient Evaluation.        Thomasene Lot Lyana Asbill 11/23/2020, 12:11 AM

## 2020-11-23 NOTE — Progress Notes (Signed)
Date and time results received: 11/23/20 0900 (use smartphrase ".now" to insert current time)  Test: MRSA PCR & Blood cultures Critical Value: MRSA PCR (+) and First blood cultures POSITIVE for gram (+) Cocci  Name of Provider Notified: Tat, MD  Orders Received? Or Actions Taken?:  Awaiting further orders,  patient already on ABX.

## 2020-11-24 DIAGNOSIS — R198 Other specified symptoms and signs involving the digestive system and abdomen: Secondary | ICD-10-CM | POA: Diagnosis not present

## 2020-11-24 DIAGNOSIS — G9341 Metabolic encephalopathy: Secondary | ICD-10-CM | POA: Diagnosis not present

## 2020-11-24 DIAGNOSIS — I5032 Chronic diastolic (congestive) heart failure: Secondary | ICD-10-CM | POA: Diagnosis not present

## 2020-11-24 DIAGNOSIS — Z7189 Other specified counseling: Secondary | ICD-10-CM

## 2020-11-24 DIAGNOSIS — N179 Acute kidney failure, unspecified: Secondary | ICD-10-CM | POA: Diagnosis not present

## 2020-11-24 LAB — CULTURE, BLOOD (ROUTINE X 2)

## 2020-11-24 MED ORDER — MORPHINE 100MG IN NS 100ML (1MG/ML) PREMIX INFUSION
2.0000 mg/h | INTRAVENOUS | Status: DC
  Administered 2020-11-24: 2 mg/h via INTRAVENOUS
  Filled 2020-11-24: qty 100

## 2020-11-24 NOTE — Progress Notes (Addendum)
PROGRESS NOTE  Brenda Hopkins IRS:[id number] DOB: 03/02/1941 DOA: 12/07/2020 PCP: Shawnie Dapper, PA-C  Brief History:  80 year old female with a history of diastolic CHF, hypertension, hyperlipidemia, COPD, tobacco abuse, Roux-en-Y marginal ulcer, anxiety presenting with 1 day history of abdominal pain.  Around 3 PM on 11/20/2020, patient was noted to be confused and disoriented with hallucinations.  EMS was activated and patient was noted to have a systolic blood pressure of 85.  She was noted to be hypoxic when placed on nonrebreather.   In the emergency department, patient was noted to be hypotensive with temperature 99.4 F.  BMP showed sodium 136, potassium 3.8, serum creatinine 2.10.  LFTs were unremarkable.  WBC 3.3, hemoglobin 13.8, platelets 261,000.  CT of the abdomen and pelvis showed a large pneumoperitoneum with ascites.  General surgery was consulted.  However, extensive goals of care discussion was held with the patient's family.  Given the patient's significant comorbidities and current acute medical state, she was felt to have significant morbidity and mortality to undergo surgical intervention.  As result, patient's family opted for nonoperative management.  Subsequently, they decided to transition the patient's focus of care to full comfort.  However, medical therapies were continued including IV fluids, antibiotics, steroids until the remainder of the patient's family was able to arrive to see the patient.  After patient's daughter and son arrived to see patient, GOC discussions were held.  We discussed the patient's poor prognosis in the setting of her advanced age and co-morbidities and baseline poor functional capacity.  They confirmed that they did not want any heroic measures.  They understood that the patient would not survive any surgical intervention and would not want her to undergo surgery.  They did not want her to suffer and wished to transition the  patient's focus of care to focus on comfort only.  All medications with curative intent were discontinued.  Assessment/Plan:  Septic shock -Patient presented with hypotension refractory to IV fluid resuscitation, WBC 3.3, tachycardia -Patient was started on Levophed, Zosyn, IV fluids, and IV steroids -Total care discussion was held with the patient's family and focus of care is being transitioned to full comfort once the patient's daughter is able to arrive to see the patient -Patient's family understand her grave prognosis -After patient's daughter and son arrived to see patient, GOC discussions were held.  They did not want her to suffer and wished to transition the patient's focus of care to focus on comfort only   Pneumoperitoneum/perforated viscus -General surgery was consulted -She was felt to be a high risk surgical candidate and unlikely to survive surgery -After goals of care discussion, patient will be transition to full comfort   AKI -Baseline creatinine 0.5-0.8 -Secondary to sepsis -Presented with serum creatinine 2.10   Acute Respiratory Failure with hypoxia -she is DNR/DNI -she has COPD and tobacco abuse -personally reviewed CXR-->+pneumoperitoneum   Acute metabolic Encephalopathy -due to sepsis   Upper GI bleed history -Patient was admitted in June 2022 secondary to hematemesis -She underwent EGD which showed gastritis and an anastomotic ulcer   Hyperlipidemia -Patient has been transitioned to full comfort   Essential hypertension -Antihypertensives were held secondary to sepsis and hypotension   Chronic diastolic CHF -Patient has been transitioned to full comfort care -Not active issue presently   Tobacco abuse -Patient has been transitioned to full comfort care    GOC -Advance care planning, including the explanation and discussion of  advance directives was carried out with the patient and family.  Code status including explanations of "Full Code" and  "DNR" and alternatives were discussed in detail.  Discussion of end-of-life issues including but not limited palliative care, hospice care and the concept of hospice, other end-of-life care options, power of attorney for health care decisions, living wills, and physician orders for life-sustaining treatment were also discussed with the patient and family.  Total face to face time 16 minutes.     Status is: Inpatient   Remains inpatient appropriate because:Hemodynamically unstable   Dispo: The patient is from: Home              Anticipated d/c is to: In hospital death              Patient currently is not medically stable to d/c.              Difficult to place patient No               Family Communication:   son and daughter at bedside 9/13   Consultants:  General Surgery   Code Status:  DNR   DVT Prophylaxis:  FULL COMFORT   Total time spent 35 minutes.  Greater than 50% spent face to face counseling and coordinating care.     Subjective: Patient is somnolent but arouseable.  ROS not possible due to altered mental status  Objective: Vitals:   11/24/20 0130 11/24/20 0145 11/24/20 0400 11/24/20 1100  BP: (!) 114/39 (!) 113/30 (!) 127/22 (!) 113/42  Pulse: 65 (!) 125 (!) 105 (!) 117  Resp: 18 16 (!) 23 12  Temp:      TempSrc:      SpO2: 97% 99% 99% 98%  Weight:      Height:        Intake/Output Summary (Last 24 hours) at 11/24/2020 1110 Last data filed at 11/24/2020 0400 Gross per 24 hour  Intake 5027.22 ml  Output 167 ml  Net 4860.22 ml   Weight change:  Exam:  General:  Pt is somnolent does not follows commands appropriately, not in acute distress HEENT: No icterus, No thrush, No neck mass, /AT Cardiovascular: RRR, S1/S2, no rubs, no gallops Respiratory:bibasilar rales.  No wheeze Abdomen: Soft/+BS, diffusely tender, non distended, no guarding Extremities: 1+ LE edema, No lymphangitis, No petechiae, No rashes, no synovitis   Data Reviewed: I have  personally reviewed following labs and imaging studies Basic Metabolic Panel: Recent Labs  Lab 26-Nov-2020 1853  NA 136  K 3.8  CL 103  CO2 25  GLUCOSE 122*  BUN 66*  CREATININE 2.10*  CALCIUM 7.5*   Liver Function Tests: Recent Labs  Lab 11/26/20 1853  AST 32  ALT 29  ALKPHOS 96  BILITOT 0.7  PROT 5.4*  ALBUMIN 1.7*   No results for input(s): LIPASE, AMYLASE in the last 168 hours. No results for input(s): AMMONIA in the last 168 hours. Coagulation Profile: Recent Labs  Lab 26-Nov-2020 1853  INR 1.1   CBC: Recent Labs  Lab November 26, 2020 1853  WBC 3.3*  NEUTROABS 2.4  HGB 13.8  HCT 42.8  MCV 88.8  PLT 361   Cardiac Enzymes: No results for input(s): CKTOTAL, CKMB, CKMBINDEX, TROPONINI in the last 168 hours. BNP: Invalid input(s): POCBNP CBG: Recent Labs  Lab 11/26/2020 1843  GLUCAP 93   HbA1C: No results for input(s): HGBA1C in the last 72 hours. Urine analysis:    Component Value Date/Time   COLORURINE YELLOW 06/30/2020  0129   APPEARANCEUR HAZY (A) 06/30/2020 0129   LABSPEC 1.013 06/30/2020 0129   PHURINE 5.0 06/30/2020 0129   GLUCOSEU NEGATIVE 06/30/2020 0129   HGBUR MODERATE (A) 06/30/2020 0129   BILIRUBINUR NEGATIVE 06/30/2020 0129   KETONESUR NEGATIVE 06/30/2020 0129   PROTEINUR NEGATIVE 06/30/2020 0129   NITRITE NEGATIVE 06/30/2020 0129   LEUKOCYTESUR LARGE (A) 06/30/2020 0129   Sepsis Labs: @LABRCNTIP (procalcitonin:4,lacticidven:4) ) Recent Results (from the past 240 hour(s))  Resp Panel by RT-PCR (Flu A&B, Covid) Nasopharyngeal Swab     Status: None   Collection Time: 12/21/20  6:56 PM   Specimen: Nasopharyngeal Swab; Nasopharyngeal(NP) swabs in vial transport medium  Result Value Ref Range Status   SARS Coronavirus 2 by RT PCR NEGATIVE NEGATIVE Final    Comment: (NOTE) SARS-CoV-2 target nucleic acids are NOT DETECTED.  The SARS-CoV-2 RNA is generally detectable in upper respiratory specimens during the acute phase of infection. The  lowest concentration of SARS-CoV-2 viral copies this assay can detect is 138 copies/mL. A negative result does not preclude SARS-Cov-2 infection and should not be used as the sole basis for treatment or other patient management decisions. A negative result may occur with  improper specimen collection/handling, submission of specimen other than nasopharyngeal swab, presence of viral mutation(s) within the areas targeted by this assay, and inadequate number of viral copies(<138 copies/mL). A negative result must be combined with clinical observations, patient history, and epidemiological information. The expected result is Negative.  Fact Sheet for Patients:  BloggerCourse.com  Fact Sheet for Healthcare Providers:  SeriousBroker.it  This test is no t yet approved or cleared by the [place] FDA and  has been authorized for detection and/or diagnosis of SARS-CoV-2 by FDA under an Emergency Use Authorization (EUA). This EUA will remain  in effect (meaning this test can be used) for the duration of the COVID-19 declaration under Section 564(b)(1) of the Act, 21 U.S.C.section 360bbb-3(b)(1), unless the authorization is terminated  or revoked sooner.       Influenza A by PCR NEGATIVE NEGATIVE Final   Influenza B by PCR NEGATIVE NEGATIVE Final    Comment: (NOTE) The Xpert Xpress SARS-CoV-2/FLU/RSV plus assay is intended as an aid in the diagnosis of influenza from Nasopharyngeal swab specimens and should not be used as a sole basis for treatment. Nasal washings and aspirates are unacceptable for Xpert Xpress SARS-CoV-2/FLU/RSV testing.  Fact Sheet for Patients: BloggerCourse.com  Fact Sheet for Healthcare Providers: SeriousBroker.it  This test is not yet approved or cleared by the [place] FDA and has been authorized for detection and/or diagnosis of SARS-CoV-2 by FDA under  an Emergency Use Authorization (EUA). This EUA will remain in effect (meaning this test can be used) for the duration of the COVID-19 declaration under Section 564(b)(1) of the Act, 21 U.S.C. section 360bbb-3(b)(1), unless the authorization is terminated or revoked.  Performed at [facility], [address] [place], [place] [address]   Blood Culture (routine x 2)     Status: Abnormal   Collection Time: 2020/12/21  7:11 PM   Specimen: Right Antecubital; Blood  Result Value Ref Range Status   Specimen Description   Final    RIGHT ANTECUBITAL Performed at [facility], [address] [place], [place] [address]    Special Requests   Final    BOTTLES DRAWN AEROBIC ONLY Blood Culture results may not be optimal due to an inadequate volume of blood received in culture bottles Performed at [facility], [address], [place], [place]  [address]    Culture  Setup Time   Final    GRAM POSITIVE COCCI AEROBIC BOTTLE ONLY Gram Stain Report Called to,Read Back By and Verified With: HILTON,L AT 0905 ON 9.12.22 BY RUCINSKI,B Organism ID to follow CRITICAL RESULT CALLED TO, READ BACK BY AND VERIFIED WITH: L POOLE PHARMD 1603 11/23/20 A BROWNING    Culture (A)  Final    STAPHYLOCOCCUS EPIDERMIDIS THE SIGNIFICANCE OF ISOLATING THIS ORGANISM FROM A SINGLE SET OF BLOOD CULTURES WHEN MULTIPLE SETS ARE DRAWN IS UNCERTAIN. PLEASE NOTIFY THE MICROBIOLOGY DEPARTMENT WITHIN ONE WEEK IF SPECIATION AND SENSITIVITIES ARE REQUIRED. Performed at [facility], [address] [place], [place] [address]    Report Status 11/24/2020 FINAL  Final  Blood Culture ID Panel (Reflexed)     Status: Abnormal   Collection Time: 11/28/2020  7:11 PM  Result Value Ref Range Status   Enterococcus faecalis NOT DETECTED NOT DETECTED Final   Enterococcus Faecium NOT DETECTED NOT DETECTED Final   Listeria monocytogenes NOT DETECTED NOT DETECTED Final   Staphylococcus species DETECTED (A) NOT DETECTED Final    Comment: CRITICAL  RESULT CALLED TO, READ BACK BY AND VERIFIED WITH: L POOLE PHARMD 1603 11/23/20 A BROWNING    Staphylococcus aureus (BCID) NOT DETECTED NOT DETECTED Final   Staphylococcus epidermidis DETECTED (A) NOT DETECTED Final    Comment: Methicillin (oxacillin) resistant coagulase negative staphylococcus. Possible blood culture contaminant (unless isolated from more than one blood culture draw or clinical case suggests pathogenicity). No antibiotic treatment is indicated for blood  culture contaminants. CRITICAL RESULT CALLED TO, READ BACK BY AND VERIFIED WITH: L POOLE PHARMD 1603 11/23/20 A BROWNING    Staphylococcus lugdunensis NOT DETECTED NOT DETECTED Final   Streptococcus species NOT DETECTED NOT DETECTED Final   Streptococcus agalactiae NOT DETECTED NOT DETECTED Final   Streptococcus pneumoniae NOT DETECTED NOT DETECTED Final   Streptococcus pyogenes NOT DETECTED NOT DETECTED Final   A.calcoaceticus-baumannii NOT DETECTED NOT DETECTED Final   Bacteroides fragilis NOT DETECTED NOT DETECTED Final   Enterobacterales NOT DETECTED NOT DETECTED Final   Enterobacter cloacae complex NOT DETECTED NOT DETECTED Final   Escherichia coli NOT DETECTED NOT DETECTED Final   Klebsiella aerogenes NOT DETECTED NOT DETECTED Final   Klebsiella oxytoca NOT DETECTED NOT DETECTED Final   Klebsiella pneumoniae NOT DETECTED NOT DETECTED Final   Proteus species NOT DETECTED NOT DETECTED Final   Salmonella species NOT DETECTED NOT DETECTED Final   Serratia marcescens NOT DETECTED NOT DETECTED Final   Haemophilus influenzae NOT DETECTED NOT DETECTED Final   Neisseria meningitidis NOT DETECTED NOT DETECTED Final   Pseudomonas aeruginosa NOT DETECTED NOT DETECTED Final   Stenotrophomonas maltophilia NOT DETECTED NOT DETECTED Final   Candida albicans NOT DETECTED NOT DETECTED Final   Candida auris NOT DETECTED NOT DETECTED Final   Candida glabrata NOT DETECTED NOT DETECTED Final   Candida krusei NOT DETECTED NOT DETECTED  Final   Candida parapsilosis NOT DETECTED NOT DETECTED Final   Candida tropicalis NOT DETECTED NOT DETECTED Final   Cryptococcus neoformans/gattii NOT DETECTED NOT DETECTED Final   Methicillin resistance mecA/C DETECTED (A) NOT DETECTED Final    Comment: CRITICAL RESULT CALLED TO, READ BACK BY AND VERIFIED WITH: L POOLE PHARMD 1603 11/23/20 A BROWNING Performed at [facility], [address], [place], [place] [address]   Blood Culture (routine x 2)     Status: None (Preliminary result)   Collection Time: 12/02/2020  7:18 PM   Specimen: BLOOD  LEFT ARM  Result Value Ref Range Status   Specimen Description BLOOD LEFT ARM  Final   Special Requests   Final    BOTTLES DRAWN AEROBIC ONLY Blood Culture results may not be optimal due to an inadequate volume of blood received in culture bottles   Culture   Final    NO GROWTH 2 DAYS Performed at [facility], [address], [place], [place] [address]    Report Status PENDING  Incomplete  MRSA Next Gen by PCR, Nasal     Status: Abnormal   Collection Time: 11/23/20 12:47 AM   Specimen: Nasal Mucosa; Nasal Swab  Result Value Ref Range Status   MRSA by PCR Next Gen DETECTED (A) NOT DETECTED Final    Comment: RESULT CALLED TO, READ BACK BY AND VERIFIED WITH: HILTON, L AT 0844 ON 9.12.22 BY RUCINSKI,B (NOTE) The GeneXpert MRSA Assay (FDA approved for NASAL specimens only), is one component of a comprehensive MRSA colonization surveillance program. It is not intended to diagnose MRSA infection nor to guide or monitor treatment for MRSA infections. Test performance is not FDA approved in patients less than 61 years old. Performed at [facility], [address], [place], [place] [address]      Scheduled Meds: Continuous Infusions:  sodium chloride 100 mL/hr at 11/23/20 1156   norepinephrine (LEVOPHED) Adult infusion Stopped (11/24/20 1108)   piperacillin-tazobactam (ZOSYN)  IV 3.375 g (11/23/20 2149)    Procedures/Studies: CT ABDOMEN  PELVIS WO CONTRAST  Result Date: 12/10/2020 CLINICAL DATA:  Abdominal pain and fever. EXAM: CT ABDOMEN AND PELVIS WITHOUT CONTRAST TECHNIQUE: Multidetector CT imaging of the abdomen and pelvis was performed following the standard protocol without IV contrast. COMPARISON:  CT abdomen pelvis dated 08/17/2020. FINDINGS: Evaluation of this exam is limited due to respiratory motion artifact. Lower chest: Small right pleural effusion. There are bibasilar atelectasis. Advanced three-vessel coronary vascular calcification. Large pneumoperitoneum.  Small ascites. Hepatobiliary: The liver is grossly unremarkable. The gallbladder is distended. No calcified gallstone. Pneumobilia. Pancreas: The pancreas is poorly visualized Spleen: Normal in size without focal abnormality. Adrenals/Urinary Tract: Mild bilateral adrenal thickening/hyperplasia. Mild bilateral renal parenchyma atrophy. There is no hydronephrosis or nephrolithiasis on either side. The urinary bladder is grossly unremarkable. Stomach/Bowel: Postsurgical changes of gastric bypass. No evidence of bowel obstruction. Vascular/Lymphatic: Advanced aortoiliac atherosclerotic disease. The IVC is unremarkable. No adenopathy. Reproductive: Hysterectomy. Other: Fat containing supraumbilical hernia. The neck of the hernia defect measures approximately 16 mm in transverse axial diameter. Musculoskeletal: Osteopenia with degenerative changes of the spine. 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 L5-S1 posterior fusion. There is grade 2 L5-S1 anterolisthesis. T9 compression fracture with complete loss of vertebral body height and anterior wedging similar to prior CT. No acute osseous pathology. IMPRESSION: 1. Large pneumoperitoneum and small ascites. Findings concerning for bowel perforation or anastomotic dehiscence. Surgical consult is advised. 2. Small right pleural effusion. 3. Aortic Atherosclerosis (ICD10-I70.0). These results were called by telephone at the time of  interpretation on 12/10/2020 at 8:17 pm to provider Pricilla Loveless , who verbally acknowledged these results. Electronically Signed   By: Elgie Collard M.D.   On: 10-Dec-2020 20:19   DG Chest Port 1 View  Result Date: 12/10/20 CLINICAL DATA:  Short of breath, altered level of consciousness EXAM: PORTABLE CHEST 1 VIEW COMPARISON:  08/17/2020 FINDINGS: Single frontal view of the chest demonstrates an unremarkable cardiac silhouette. No acute airspace disease, effusion, or pneumothorax. There is free gas beneath the right hemidiaphragm, most consistent with perforated  viscus. There are no acute bony abnormalities. Chronic degenerative changes of the right shoulder again noted. IMPRESSION: 1. Free gas beneath the right hemidiaphragm, most compatible with perforated viscus. 2. No acute airspace disease. Critical Value/emergent results were called by telephone at the time of interpretation on 11/17/2020 at 7:40 pm to provider Pricilla Loveless , who verbally acknowledged these results. Electronically Signed   By: Sharlet Salina M.D.   On: 11/26/2020 19:43   DG Chest Port 1V same Day  Result Date: 11/29/2020 CLINICAL DATA:  Status post central line placement. EXAM: PORTABLE CHEST 1 VIEW COMPARISON:  Chest radiograph dated 12/02/2020. FINDINGS: Right IJ central venous line with tip in the region of the right atrium close to the right atrium/IVC junction. Recommend retraction by proximally 4 cm. Bilateral linear atelectasis/scarring. No focal consolidation, pleural effusion or pneumothorax. Stable cardiac silhouette. Atherosclerotic calcification of the aorta. No acute osseous pathology. IMPRESSION: Right IJ central venous line with tip in the region of the right atrium close to the IVC junction. No pneumothorax. Electronically Signed   By: Elgie Collard M.D.   On: 11/16/2020 22:30    Catarina Hartshorn, DO  [facility]  If 7PM-7AM, please contact night-coverage [email or web address] Password [email or web address] 11/24/2020, 11:10  AM   LOS: 2 days

## 2020-11-24 NOTE — Progress Notes (Signed)
Chaplain engaged in an initial visit with Brenda Hopkins and her daughter Brenda Hopkins.  Brenda Hopkins shared how great of a mother Brenda Hopkins has been.  She described her as being loving and strong.  Brenda Hopkins detailed that [place] taught them to take their education seriously and obtain success.  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  She was great at playing Spades.  Brenda Hopkins also described her mom as being "feisty."  Brenda Hopkins is from [place] and moved to be closer to [place] and her grandchildren.   Chaplain offered listening, prayer, and presence.    11/24/20 1700  Clinical Encounter Type  Visited With Patient and family together  Visit Type Initial;Spiritual support

## 2020-11-25 DIAGNOSIS — R198 Other specified symptoms and signs involving the digestive system and abdomen: Secondary | ICD-10-CM | POA: Diagnosis not present

## 2020-11-25 DIAGNOSIS — L8990 Pressure ulcer of unspecified site, unspecified stage: Secondary | ICD-10-CM | POA: Insufficient documentation

## 2020-11-28 LAB — CULTURE, BLOOD (ROUTINE X 2): Culture: NO GROWTH

## 2020-12-12 NOTE — Progress Notes (Signed)
Patient expired at 71, family at bedside. [employer] [facility] for funeral preparation.

## 2020-12-12 NOTE — Death Summary Note (Signed)
DEATH SUMMARY   Patient Details  Name: Brenda Hopkins MRN: [id number] DOB: 1940-04-12  Admission/Discharge Information   Admit Date:  December 22, 2020  Date of Death: Date of Death: 25-Dec-2020  Time of Death: Time of Death: Jul 27, 1605  Length of Stay: 3  Referring Physician: Shawnie Dapper, PA-C   Reason(s) for Hospitalization  Septic shock in the setting of pneumoperitoneum/perforated viscus.  Diagnoses  Preliminary cause of death:  Secondary Diagnoses (including complications and co-morbidities):  Principal Problem:   Perforated abdominal viscus Active Problems:   AKI (acute kidney injury) (HCC)   Acute respiratory failure with hypoxia (HCC)   Chronic diastolic CHF (congestive heart failure) (HCC)   Acute metabolic encephalopathy   Septic shock (HCC)   DNR (do not resuscitate)   Emphysema, unspecified (HCC)   Severe sepsis with septic shock (HCC)   Acute kidney injury (HCC)   Goals of care, counseling/discussion   Pressure injury of skin   Brief Hospital Course (including significant findings, care, treatment, and services provided and events leading to death)  80 year old female with a history of diastolic CHF, hypertension, hyperlipidemia, COPD, tobacco abuse, Roux-en-Y marginal ulcer, anxiety presenting with 1 day history of abdominal pain.  Around 3 PM on December 22, 2020, patient was noted to be confused and disoriented with hallucinations.  EMS was activated and patient was noted to have a systolic blood pressure of 85.  She was noted to be hypoxic when placed on nonrebreather.   In the emergency department, patient was noted to be hypotensive with temperature 99.4 F.  BMP showed sodium 136, potassium 3.8, serum creatinine 2.10.  LFTs were unremarkable.  WBC 3.3, hemoglobin 13.8, platelets 261,000.  CT of the abdomen and pelvis showed a large pneumoperitoneum with ascites.  General surgery was consulted.  However, extensive goals of care discussion was held with the patient's  family.  Given the patient's significant comorbidities and current acute medical state, she was felt to have significant morbidity and mortality to undergo surgical intervention.  As result, patient's family opted for nonoperative management.  Subsequently, they decided to transition the patient's focus of care to full comfort.  However, medical therapies were continued including IV fluids, antibiotics, steroids until the remainder of the patient's family was able to arrive to see the patient.   After patient's daughter and son arrived to see patient, GOC discussions were held.  We discussed the patient's poor prognosis in the setting of her advanced age and co-morbidities and baseline poor functional capacity.  They confirmed that they did not want any heroic measures.  They understood that the patient would not survive any surgical intervention and would not want her to undergo surgery.  They did not want her to suffer and wished to transition the patient's focus of care to focus on comfort only.  All medications with curative intent were discontinued.  On 2020-12-25 1607 while receiving symptomatic management and comfort care patient peacefully expired.    Pertinent Labs and Studies  Significant Diagnostic Studies CT ABDOMEN PELVIS WO CONTRAST  Result Date: 2020/12/22 CLINICAL DATA:  Abdominal pain and fever. EXAM: CT ABDOMEN AND PELVIS WITHOUT CONTRAST TECHNIQUE: Multidetector CT imaging of the abdomen and pelvis was performed following the standard protocol without IV contrast. COMPARISON:  CT abdomen pelvis dated 08/17/2020. FINDINGS: Evaluation of this exam is limited due to respiratory motion artifact. Lower chest: Small right pleural effusion. There are bibasilar atelectasis. Advanced three-vessel coronary vascular calcification. Large pneumoperitoneum.  Small ascites. Hepatobiliary: The liver is grossly unremarkable. The gallbladder  is distended. No calcified gallstone. Pneumobilia. Pancreas: The  pancreas is poorly visualized Spleen: Normal in size without focal abnormality. Adrenals/Urinary Tract: Mild bilateral adrenal thickening/hyperplasia. Mild bilateral renal parenchyma atrophy. There is no hydronephrosis or nephrolithiasis on either side. The urinary bladder is grossly unremarkable. Stomach/Bowel: Postsurgical changes of gastric bypass. No evidence of bowel obstruction. Vascular/Lymphatic: Advanced aortoiliac atherosclerotic disease. The IVC is unremarkable. No adenopathy. Reproductive: Hysterectomy. Other: Fat containing supraumbilical hernia. The neck of the hernia defect measures approximately 16 mm in transverse axial diameter. Musculoskeletal: Osteopenia with degenerative changes of the spine. 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 L5-S1 posterior fusion. There is grade 2 L5-S1 anterolisthesis. T9 compression fracture with complete loss of vertebral body height and anterior wedging similar to prior CT. No acute osseous pathology. IMPRESSION: 1. Large pneumoperitoneum and small ascites. Findings concerning for bowel perforation or anastomotic dehiscence. Surgical consult is advised. 2. Small right pleural effusion. 3. Aortic Atherosclerosis (ICD10-I70.0). These results were called by telephone at the time of interpretation on December 12, 2020 at 8:17 pm to provider Pricilla Loveless , who verbally acknowledged these results. Electronically Signed   By: Elgie Collard M.D.   On: Dec 12, 2020 20:19   DG Chest Port 1 View  Result Date: 2020-12-12 CLINICAL DATA:  Short of breath, altered level of consciousness EXAM: PORTABLE CHEST 1 VIEW COMPARISON:  08/17/2020 FINDINGS: Single frontal view of the chest demonstrates an unremarkable cardiac silhouette. No acute airspace disease, effusion, or pneumothorax. There is free gas beneath the right hemidiaphragm, most consistent with perforated viscus. There are no acute bony abnormalities. Chronic degenerative changes of the right shoulder again noted.  IMPRESSION: 1. Free gas beneath the right hemidiaphragm, most compatible with perforated viscus. 2. No acute airspace disease. Critical Value/emergent results were called by telephone at the time of interpretation on 12-12-2020 at 7:40 pm to provider Pricilla Loveless , who verbally acknowledged these results. Electronically Signed   By: Sharlet Salina M.D.   On: December 12, 2020 19:43   DG Chest Port 1V same Day  Result Date: 2020/12/12 CLINICAL DATA:  Status post central line placement. EXAM: PORTABLE CHEST 1 VIEW COMPARISON:  Chest radiograph dated 12-12-2020. FINDINGS: Right IJ central venous line with tip in the region of the right atrium close to the right atrium/IVC junction. Recommend retraction by proximally 4 cm. Bilateral linear atelectasis/scarring. No focal consolidation, pleural effusion or pneumothorax. Stable cardiac silhouette. Atherosclerotic calcification of the aorta. No acute osseous pathology. IMPRESSION: Right IJ central venous line with tip in the region of the right atrium close to the IVC junction. No pneumothorax. Electronically Signed   By: Elgie Collard M.D.   On: 2020-12-12 22:30    Microbiology Recent Results (from the past 240 hour(s))  Resp Panel by RT-PCR (Flu A&B, Covid) Nasopharyngeal Swab     Status: None   Collection Time: 12/12/2020  6:56 PM   Specimen: Nasopharyngeal Swab; Nasopharyngeal(NP) swabs in vial transport medium  Result Value Ref Range Status   SARS Coronavirus 2 by RT PCR NEGATIVE NEGATIVE Final    Comment: (NOTE) SARS-CoV-2 target nucleic acids are NOT DETECTED.  The SARS-CoV-2 RNA is generally detectable in upper respiratory specimens during the acute phase of infection. The lowest concentration of SARS-CoV-2 viral copies this assay can detect is 138 copies/mL. A negative result does not preclude SARS-Cov-2 infection and should not be used as the sole basis for treatment or other patient management decisions. A negative result may occur with  improper  specimen collection/handling, submission of specimen other than  nasopharyngeal swab, presence of viral mutation(s) within the areas targeted by this assay, and inadequate number of viral copies(<138 copies/mL). A negative result must be combined with clinical observations, patient history, and epidemiological information. The expected result is Negative.  Fact Sheet for Patients:  BloggerCourse.com  Fact Sheet for Healthcare Providers:  SeriousBroker.it  This test is no t yet approved or cleared by the [place] FDA and  has been authorized for detection and/or diagnosis of SARS-CoV-2 by FDA under an Emergency Use Authorization (EUA). This EUA will remain  in effect (meaning this test can be used) for the duration of the COVID-19 declaration under Section 564(b)(1) of the Act, 21 U.S.C.section 360bbb-3(b)(1), unless the authorization is terminated  or revoked sooner.       Influenza A by PCR NEGATIVE NEGATIVE Final   Influenza B by PCR NEGATIVE NEGATIVE Final    Comment: (NOTE) The Xpert Xpress SARS-CoV-2/FLU/RSV plus assay is intended as an aid in the diagnosis of influenza from Nasopharyngeal swab specimens and should not be used as a sole basis for treatment. Nasal washings and aspirates are unacceptable for Xpert Xpress SARS-CoV-2/FLU/RSV testing.  Fact Sheet for Patients: BloggerCourse.com  Fact Sheet for Healthcare Providers: SeriousBroker.it  This test is not yet approved or cleared by the [place] FDA and has been authorized for detection and/or diagnosis of SARS-CoV-2 by FDA under an Emergency Use Authorization (EUA). This EUA will remain in effect (meaning this test can be used) for the duration of the COVID-19 declaration under Section 564(b)(1) of the Act, 21 U.S.C. section 360bbb-3(b)(1), unless the authorization is terminated or revoked.  Performed at  [facility], [address] [place], [place] [address]   Blood Culture (routine x 2)     Status: Abnormal   Collection Time: 12/02/2020  7:11 PM   Specimen: Right Antecubital; Blood  Result Value Ref Range Status   Specimen Description   Final    RIGHT ANTECUBITAL Performed at [facility], [address], [place], [place] [address]    Special Requests   Final    BOTTLES DRAWN AEROBIC ONLY Blood Culture results may not be optimal due to an inadequate volume of blood received in culture bottles Performed at [facility], [address], [place], [place] [address]    Culture  Setup Time   Final    GRAM POSITIVE COCCI AEROBIC BOTTLE ONLY Gram Stain Report Called to,Read Back By and Verified With: HILTON,L AT 0905 ON 9.12.22 BY RUCINSKI,B Organism ID to follow CRITICAL RESULT CALLED TO, READ BACK BY AND VERIFIED WITH: L POOLE PHARMD 1603 11/23/20 A BROWNING    Culture (A)  Final    STAPHYLOCOCCUS EPIDERMIDIS THE SIGNIFICANCE OF ISOLATING THIS ORGANISM FROM A SINGLE SET OF BLOOD CULTURES WHEN MULTIPLE SETS ARE DRAWN IS UNCERTAIN. PLEASE NOTIFY THE MICROBIOLOGY DEPARTMENT WITHIN ONE WEEK IF SPECIATION AND SENSITIVITIES ARE REQUIRED. Performed at [facility], [address], [place], [place] [address]    Report Status 11/24/2020 FINAL  Final  Blood Culture ID Panel (Reflexed)     Status: Abnormal   Collection Time: 12-02-20  7:11 PM  Result Value Ref Range Status   Enterococcus faecalis NOT DETECTED NOT DETECTED Final   Enterococcus Faecium NOT DETECTED NOT DETECTED Final   Listeria monocytogenes NOT DETECTED NOT DETECTED Final   Staphylococcus species DETECTED (A) NOT DETECTED Final    Comment: CRITICAL RESULT CALLED TO, READ BACK BY AND VERIFIED WITH: L POOLE PHARMD 1603 11/23/20 A BROWNING  Staphylococcus aureus (BCID) NOT DETECTED NOT DETECTED Final   Staphylococcus epidermidis DETECTED (A) NOT DETECTED Final    Comment: Methicillin (oxacillin) resistant coagulase negative  staphylococcus. Possible blood culture contaminant (unless isolated from more than one blood culture draw or clinical case suggests pathogenicity). No antibiotic treatment is indicated for blood  culture contaminants. CRITICAL RESULT CALLED TO, READ BACK BY AND VERIFIED WITH: L POOLE PHARMD 1603 11/23/20 A BROWNING    Staphylococcus lugdunensis NOT DETECTED NOT DETECTED Final   Streptococcus species NOT DETECTED NOT DETECTED Final   Streptococcus agalactiae NOT DETECTED NOT DETECTED Final   Streptococcus pneumoniae NOT DETECTED NOT DETECTED Final   Streptococcus pyogenes NOT DETECTED NOT DETECTED Final   A.calcoaceticus-baumannii NOT DETECTED NOT DETECTED Final   Bacteroides fragilis NOT DETECTED NOT DETECTED Final   Enterobacterales NOT DETECTED NOT DETECTED Final   Enterobacter cloacae complex NOT DETECTED NOT DETECTED Final   Escherichia coli NOT DETECTED NOT DETECTED Final   Klebsiella aerogenes NOT DETECTED NOT DETECTED Final   Klebsiella oxytoca NOT DETECTED NOT DETECTED Final   Klebsiella pneumoniae NOT DETECTED NOT DETECTED Final   Proteus species NOT DETECTED NOT DETECTED Final   Salmonella species NOT DETECTED NOT DETECTED Final   Serratia marcescens NOT DETECTED NOT DETECTED Final   Haemophilus influenzae NOT DETECTED NOT DETECTED Final   Neisseria meningitidis NOT DETECTED NOT DETECTED Final   Pseudomonas aeruginosa NOT DETECTED NOT DETECTED Final   Stenotrophomonas maltophilia NOT DETECTED NOT DETECTED Final   Candida albicans NOT DETECTED NOT DETECTED Final   Candida auris NOT DETECTED NOT DETECTED Final   Candida glabrata NOT DETECTED NOT DETECTED Final   Candida krusei NOT DETECTED NOT DETECTED Final   Candida parapsilosis NOT DETECTED NOT DETECTED Final   Candida tropicalis NOT DETECTED NOT DETECTED Final   Cryptococcus neoformans/gattii NOT DETECTED NOT DETECTED Final   Methicillin resistance mecA/C DETECTED (A) NOT DETECTED Final    Comment: CRITICAL RESULT CALLED  TO, READ BACK BY AND VERIFIED WITH: L POOLE PHARMD 1603 11/23/20 A BROWNING Performed at [facility], [address] [place], [place] [address]   Blood Culture (routine x 2)     Status: None (Preliminary result)   Collection Time: Dec 20, 2020  7:18 PM   Specimen: BLOOD LEFT ARM  Result Value Ref Range Status   Specimen Description BLOOD LEFT ARM  Final   Special Requests   Final    BOTTLES DRAWN AEROBIC ONLY Blood Culture results may not be optimal due to an inadequate volume of blood received in culture bottles   Culture   Final    NO GROWTH 3 DAYS Performed at [facility], [address], [place], [place] [address]    Report Status PENDING  Incomplete  MRSA Next Gen by PCR, Nasal     Status: Abnormal   Collection Time: 11/23/20 12:47 AM   Specimen: Nasal Mucosa; Nasal Swab  Result Value Ref Range Status   MRSA by PCR Next Gen DETECTED (A) NOT DETECTED Final    Comment: RESULT CALLED TO, READ BACK BY AND VERIFIED WITH: HILTON, L AT 0844 ON 9.12.22 BY RUCINSKI,B (NOTE) The GeneXpert MRSA Assay (FDA approved for NASAL specimens only), is one component of a comprehensive MRSA colonization surveillance program. It is not intended to diagnose MRSA infection nor to guide or monitor treatment for MRSA infections. Test performance is not FDA approved in patients less than 8 years old. Performed at [facility], [address] [place], [place] [address]  Lab Basic Metabolic Panel: Recent Labs  Lab 12/03/2020 1853  NA 136  K 3.8  CL 103  CO2 25  GLUCOSE 122*  BUN 66*  CREATININE 2.10*  CALCIUM 7.5*   Liver Function Tests: Recent Labs  Lab 12/07/2020 1853  AST 32  ALT 29  ALKPHOS 96  BILITOT 0.7  PROT 5.4*  ALBUMIN 1.7*   CBC: Recent Labs  Lab 11/18/2020 1853  WBC 3.3*  NEUTROABS 2.4  HGB 13.8  HCT 42.8  MCV 88.8  PLT 361   Sepsis Labs: Recent Labs  Lab 11/27/2020 1853 12/09/2020 2113  WBC 3.3*  --   LATICACIDVEN 3.6* 3.3*    Procedures/Operations   Please refer to x-ray/imaging studies For details.   Vassie Loll 12/16/2020, 5:30 PM

## 2020-12-12 DEATH — deceased

## 2022-01-01 IMAGING — CT CT ABD-PELV W/O CM
2 of 4 series · 15 of 46 positions shown, 17 images · non-contrast
Comparison: CT the abdomen and pelvis 06/19/2020.

CLINICAL DATA: 59-year-old female with history of hematochezia.
Suspected acute diverticulitis.

EXAM:
CT ABDOMEN AND PELVIS WITHOUT CONTRAST
TECHNIQUE: Multidetector CT imaging of the abdomen and pelvis was performed
following the standard protocol without IV contrast.

[Series 2: coronal st · coronal · 0.89mm/px · 3 of 110 slices shown]
[im 37/110  soft-tissue]
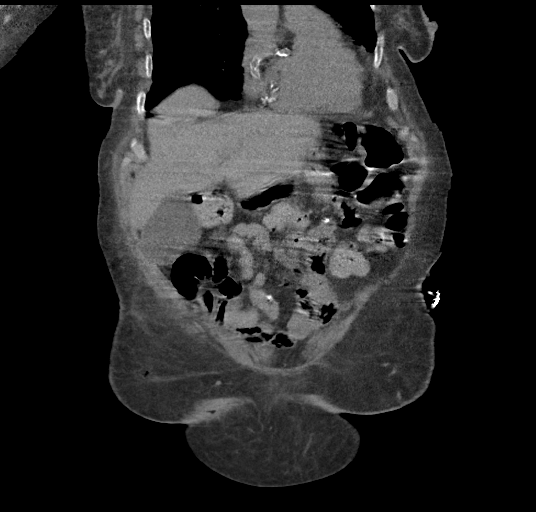
[im 49/110  soft-tissue]
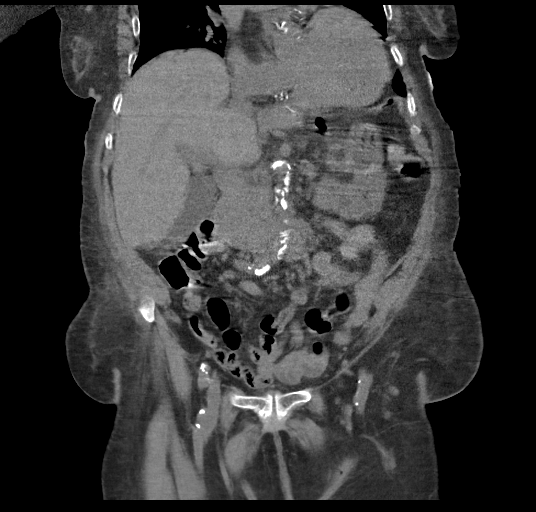
[im 61/110  soft-tissue]
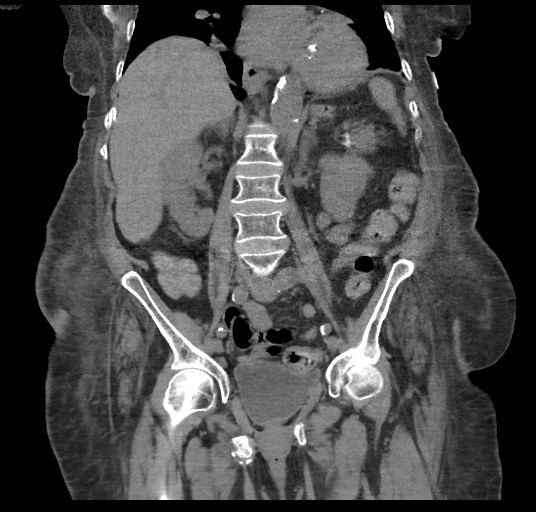

[Series 3: axial st · axial · 0.82mm/px · z∈[+831,+1226]mm · 12 of 89 slices shown, 14 images]
[im 5/89  soft-tissue]
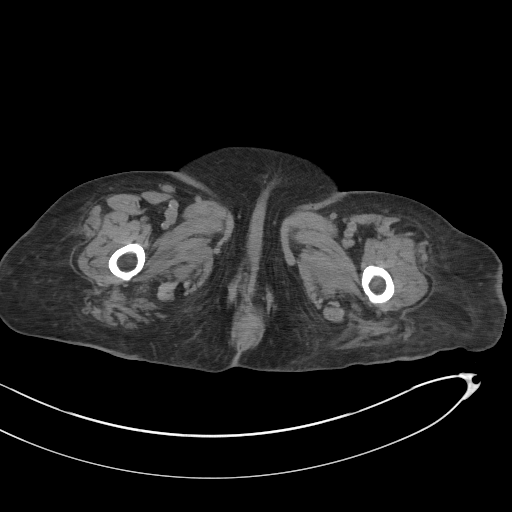
[im 5/89  bone]
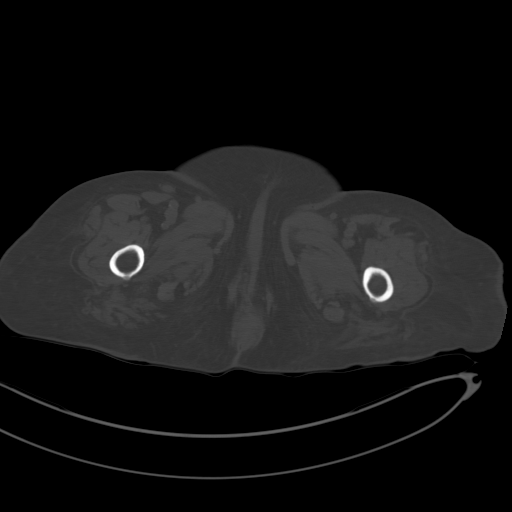
[im 14/89  soft-tissue]
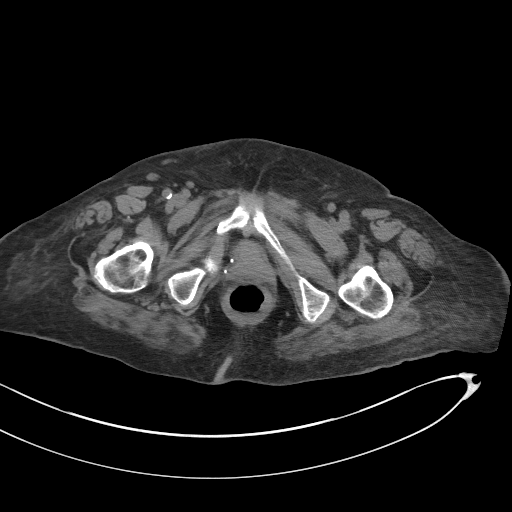
[im 19/89  soft-tissue]
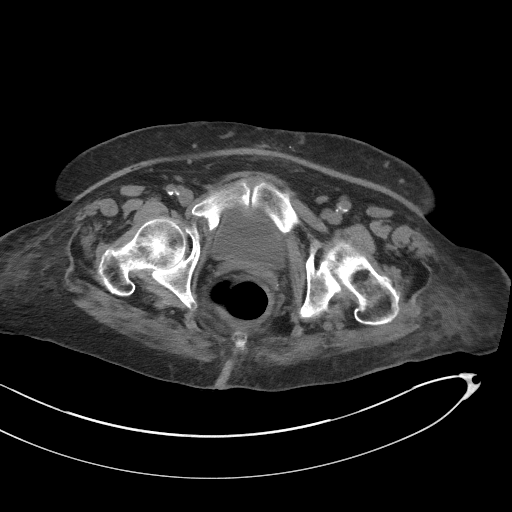
[im 28/89  soft-tissue]
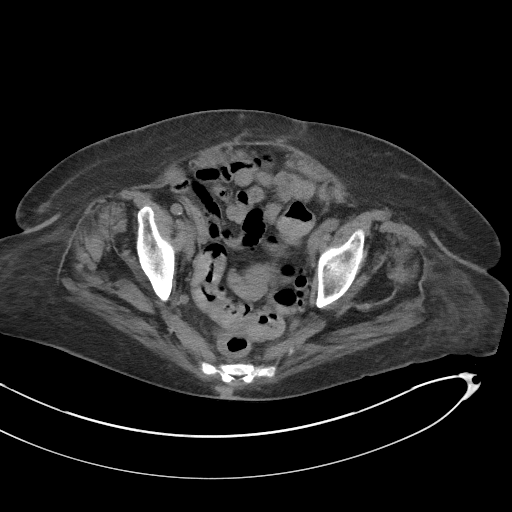
[im 33/89  soft-tissue]
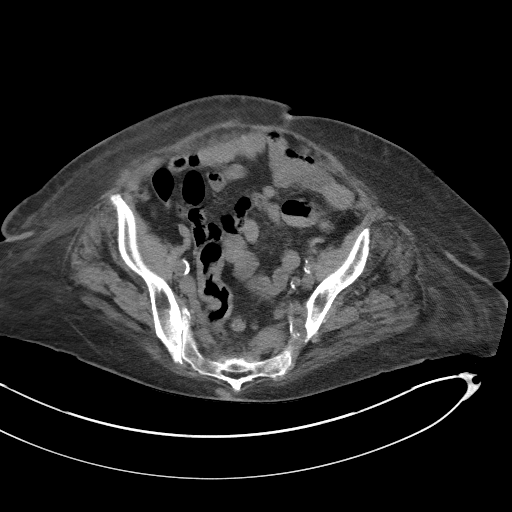
[im 42/89  soft-tissue]
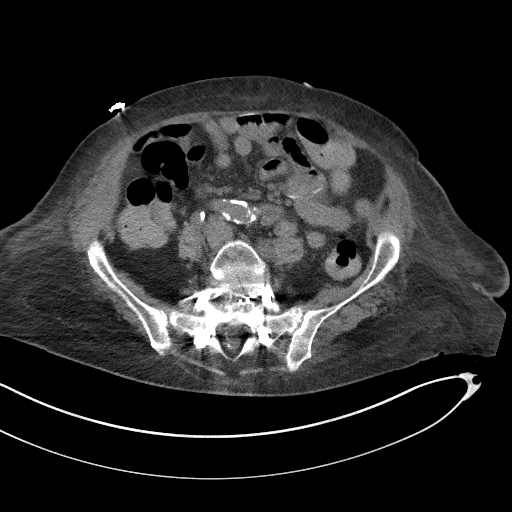
[im 47/89  soft-tissue]
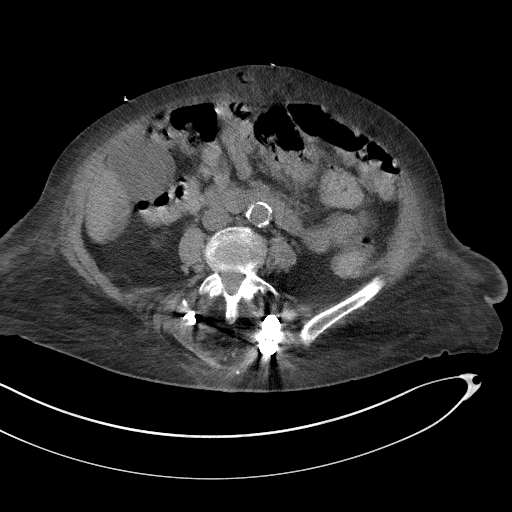
[im 56/89  soft-tissue]
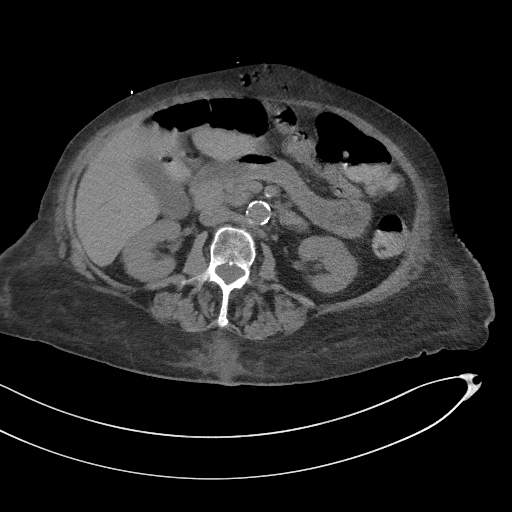
[im 61/89  soft-tissue]
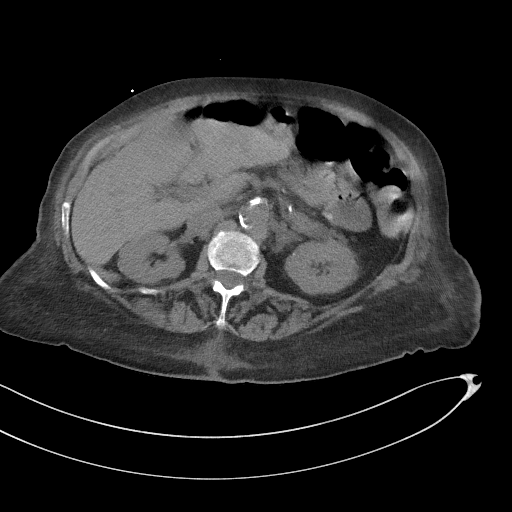
[im 61/89  bone]
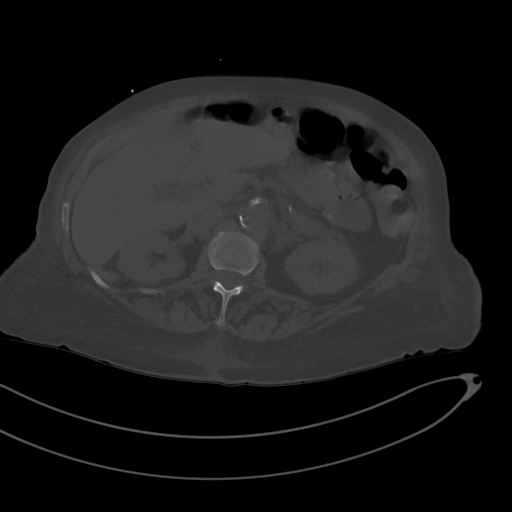
[im 70/89  soft-tissue]
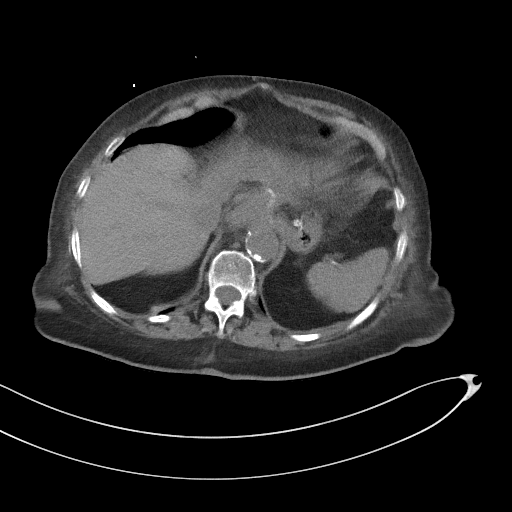
[im 75/89  soft-tissue]
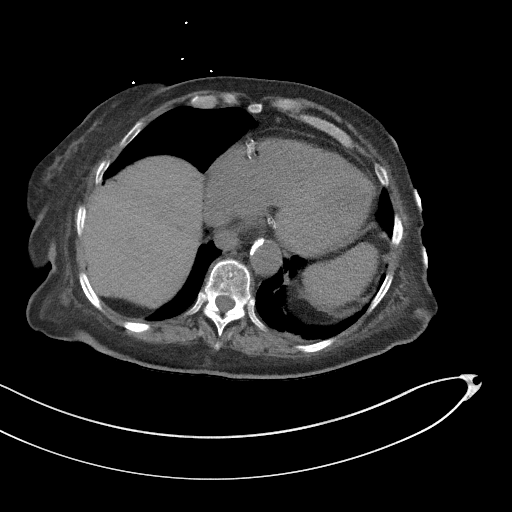
[im 84/89  soft-tissue]
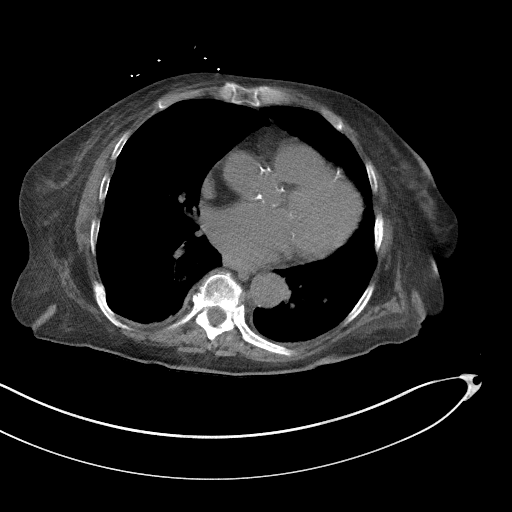

[15 of 46 positions shown; findings below may reference images not displayed]

FINDINGS: Lower chest: Atherosclerotic calcifications in the descending
thoracic aorta as well as the left main, left anterior descending,
left circumflex and right coronary arteries. Calcifications of the
mitral annulus and mitral valve.

Hepatobiliary: No definite suspicious cystic or solid hepatic
lesions are confidently identified on today's noncontrast CT
examination. Unenhanced appearance of the gallbladder is normal.

Pancreas: No definite pancreatic mass or peripancreatic fluid
collections or inflammatory changes.

Spleen: Unremarkable.

Adrenals/Urinary Tract: Calcification in the left renal hilum
appears to be vascular. No definite suspicious renal lesions.
Unenhanced appearance of the kidneys and bilateral adrenal glands is
normal. No hydroureteronephrosis. Urinary bladder is normal in
appearance.

Stomach/Bowel: Postoperative changes of Roux-en-Y gastric bypass. No
pathologic dilatation of small bowel or colon. Numerous colonic
diverticulae are noted, without surrounding inflammatory changes to
clearly indicate an acute diverticulitis at this time. The appendix
is not confidently identified and may be surgically absent.
Regardless, there are no inflammatory changes noted adjacent to the
cecum to suggest the presence of an acute appendicitis at this time.

Vascular/Lymphatic: Aortic atherosclerosis. No lymphadenopathy noted
in the abdomen or pelvis.

Reproductive: Status post hysterectomy. Ovaries are not confidently
identified may be surgically absent or atrophic.

Other: Small volume of pneumoperitoneum evident beneath both of the
diaphragms, and within the patient's ventral hernia which otherwise
contains predominantly fat. No significant volume of ascites.

Musculoskeletal: Old healed fractures of the right superior and
inferior pubic rami with posttraumatic deformity. Status post PLIF
at L5-S1 with 1.3 cm of anterolisthesis of L5 upon S1. Chronic
appearing compression fracture of T9 with complete loss of anterior
vertebral body height.
IMPRESSION: 1. Today's study is positive for pneumoperitoneum. The source of
pneumoperitoneum is not readily apparent on today's examination, but
the presence of pneumoperitoneum raises concern for potential bowel
perforation.
2. Colonic diverticulosis without definitive findings to suggest an
acute diverticulitis at this time.
3. Aortic atherosclerosis, in addition to left main and 3 vessel
coronary artery disease. Assessment for potential risk factor
modification, dietary therapy or pharmacologic therapy may be
warranted, if clinically indicated.
4. There are calcifications of the mitral annulus/valve.
Echocardiographic correlation for evaluation of potential valvular
dysfunction may be warranted if clinically indicated.
5. Additional postoperative changes and incidental finding, as
above.

Critical Value/emergent results were called by telephone at the time
of interpretation on 06/30/2020 at [DATE] to provider Dr. Wil Fredo
Laiswis, who verbally acknowledged these results.
